# Patient Record
Sex: Male | Born: 1935
Health system: Southern US, Community
[De-identification: ages and names within clinical notes are randomized; demographics above are authoritative.]

## PROBLEM LIST (undated history)

## (undated) DIAGNOSIS — N329 Bladder disorder, unspecified: Secondary | ICD-10-CM

## (undated) DIAGNOSIS — E039 Hypothyroidism, unspecified: Secondary | ICD-10-CM

## (undated) DIAGNOSIS — Z973 Presence of spectacles and contact lenses: Secondary | ICD-10-CM

## (undated) DIAGNOSIS — C801 Malignant (primary) neoplasm, unspecified: Secondary | ICD-10-CM

## (undated) DIAGNOSIS — I10 Essential (primary) hypertension: Secondary | ICD-10-CM

## (undated) DIAGNOSIS — M1990 Unspecified osteoarthritis, unspecified site: Secondary | ICD-10-CM

## (undated) DIAGNOSIS — R351 Nocturia: Secondary | ICD-10-CM

## (undated) DIAGNOSIS — Z8551 Personal history of malignant neoplasm of bladder: Secondary | ICD-10-CM

## (undated) DIAGNOSIS — Z8679 Personal history of other diseases of the circulatory system: Secondary | ICD-10-CM

## (undated) DIAGNOSIS — J439 Emphysema, unspecified: Secondary | ICD-10-CM

## (undated) DIAGNOSIS — J45909 Unspecified asthma, uncomplicated: Secondary | ICD-10-CM

## (undated) DIAGNOSIS — I739 Peripheral vascular disease, unspecified: Secondary | ICD-10-CM

## (undated) DIAGNOSIS — E785 Hyperlipidemia, unspecified: Secondary | ICD-10-CM

## (undated) DIAGNOSIS — R3915 Urgency of urination: Secondary | ICD-10-CM

## (undated) DIAGNOSIS — Z8673 Personal history of transient ischemic attack (TIA), and cerebral infarction without residual deficits: Secondary | ICD-10-CM

## (undated) DIAGNOSIS — K5792 Diverticulitis of intestine, part unspecified, without perforation or abscess without bleeding: Secondary | ICD-10-CM

## (undated) DIAGNOSIS — R0602 Shortness of breath: Secondary | ICD-10-CM

## (undated) HISTORY — PX: CATARACT EXTRACTION W/ INTRAOCULAR LENS  IMPLANT, BILATERAL: SHX1307

## (undated) HISTORY — DX: Diverticulitis of intestine, part unspecified, without perforation or abscess without bleeding: K57.92

## (undated) HISTORY — PX: UMBILICAL HERNIA REPAIR: SHX196

## (undated) HISTORY — PX: INGUINAL HERNIA REPAIR: SUR1180

## (undated) HISTORY — PX: CARPAL TUNNEL RELEASE: SHX101

---

## 1988-08-18 HISTORY — PX: TRANSURETHRAL RESECTION OF BLADDER TUMOR: SHX2575

## 2002-08-18 HISTORY — PX: TOTAL KNEE ARTHROPLASTY: SHX125

## 2003-08-19 HISTORY — PX: CAROTID ENDARTERECTOMY: SUR193

## 2003-08-19 HISTORY — PX: OTHER SURGICAL HISTORY: SHX169

## 2004-01-26 ENCOUNTER — Other Ambulatory Visit: Payer: Self-pay

## 2004-08-27 ENCOUNTER — Ambulatory Visit: Payer: Self-pay | Admitting: Specialist

## 2004-08-29 ENCOUNTER — Ambulatory Visit: Payer: Self-pay | Admitting: Specialist

## 2005-03-09 ENCOUNTER — Emergency Department: Payer: Self-pay | Admitting: Emergency Medicine

## 2005-03-11 ENCOUNTER — Emergency Department: Payer: Self-pay | Admitting: Emergency Medicine

## 2005-07-07 ENCOUNTER — Ambulatory Visit: Payer: Self-pay | Admitting: Urology

## 2005-09-04 ENCOUNTER — Ambulatory Visit: Payer: Self-pay | Admitting: Internal Medicine

## 2005-09-22 ENCOUNTER — Inpatient Hospital Stay: Payer: Self-pay | Admitting: Internal Medicine

## 2005-10-06 ENCOUNTER — Emergency Department: Payer: Self-pay | Admitting: Emergency Medicine

## 2005-10-06 ENCOUNTER — Other Ambulatory Visit: Payer: Self-pay

## 2005-10-07 ENCOUNTER — Ambulatory Visit: Payer: Self-pay

## 2005-10-14 ENCOUNTER — Ambulatory Visit: Payer: Self-pay | Admitting: General Surgery

## 2006-08-18 HISTORY — PX: ORIF HIP FRACTURE: SHX2125

## 2006-09-15 ENCOUNTER — Ambulatory Visit: Payer: Self-pay | Admitting: General Surgery

## 2006-09-15 ENCOUNTER — Other Ambulatory Visit: Payer: Self-pay

## 2006-09-21 ENCOUNTER — Ambulatory Visit: Payer: Self-pay | Admitting: General Surgery

## 2007-03-25 ENCOUNTER — Other Ambulatory Visit: Payer: Self-pay

## 2007-03-25 ENCOUNTER — Inpatient Hospital Stay: Payer: Self-pay | Admitting: Specialist

## 2007-04-20 ENCOUNTER — Ambulatory Visit: Payer: Self-pay | Admitting: Internal Medicine

## 2007-11-03 ENCOUNTER — Ambulatory Visit: Payer: Self-pay | Admitting: Internal Medicine

## 2007-11-22 ENCOUNTER — Ambulatory Visit: Payer: Self-pay | Admitting: Unknown Physician Specialty

## 2007-11-22 ENCOUNTER — Other Ambulatory Visit: Payer: Self-pay

## 2010-06-28 ENCOUNTER — Observation Stay: Payer: Self-pay

## 2010-07-19 ENCOUNTER — Ambulatory Visit: Payer: Self-pay | Admitting: Internal Medicine

## 2011-04-21 ENCOUNTER — Inpatient Hospital Stay: Payer: Self-pay | Admitting: Surgery

## 2013-12-16 ENCOUNTER — Other Ambulatory Visit: Payer: Self-pay | Admitting: Urology

## 2013-12-20 ENCOUNTER — Encounter (HOSPITAL_BASED_OUTPATIENT_CLINIC_OR_DEPARTMENT_OTHER): Payer: Self-pay | Admitting: *Deleted

## 2013-12-21 ENCOUNTER — Encounter (HOSPITAL_BASED_OUTPATIENT_CLINIC_OR_DEPARTMENT_OTHER): Payer: Self-pay | Admitting: *Deleted

## 2013-12-21 NOTE — Progress Notes (Addendum)
SPOKE W/ WIFE.  NPO AFTER MN. ARRIVE AT 1030.  NEEDS ISTAT AND EKG. WILL TAKE AM MEDS W/ SIPS OF WATER AM DOS.  PT POOR HISTORIAN.

## 2013-12-26 ENCOUNTER — Ambulatory Visit (HOSPITAL_BASED_OUTPATIENT_CLINIC_OR_DEPARTMENT_OTHER): Payer: Medicare PPO | Admitting: Anesthesiology

## 2013-12-26 ENCOUNTER — Ambulatory Visit (HOSPITAL_BASED_OUTPATIENT_CLINIC_OR_DEPARTMENT_OTHER)
Admission: RE | Admit: 2013-12-26 | Discharge: 2013-12-26 | Disposition: A | Discharge status: Home / Self Care | Payer: Medicare PPO | Source: Ambulatory Visit | Attending: Urology | Admitting: Urology

## 2013-12-26 ENCOUNTER — Encounter (HOSPITAL_BASED_OUTPATIENT_CLINIC_OR_DEPARTMENT_OTHER): Payer: Medicare PPO | Admitting: Anesthesiology

## 2013-12-26 ENCOUNTER — Encounter (HOSPITAL_BASED_OUTPATIENT_CLINIC_OR_DEPARTMENT_OTHER)
Admission: RE | Disposition: A | Discharge status: Home / Self Care | Payer: Self-pay | Source: Ambulatory Visit | Attending: Urology

## 2013-12-26 ENCOUNTER — Encounter (HOSPITAL_BASED_OUTPATIENT_CLINIC_OR_DEPARTMENT_OTHER): Payer: Self-pay | Admitting: Anesthesiology

## 2013-12-26 DIAGNOSIS — Z7982 Long term (current) use of aspirin: Secondary | ICD-10-CM | POA: Insufficient documentation

## 2013-12-26 DIAGNOSIS — N3289 Other specified disorders of bladder: Secondary | ICD-10-CM | POA: Insufficient documentation

## 2013-12-26 DIAGNOSIS — J4489 Other specified chronic obstructive pulmonary disease: Secondary | ICD-10-CM | POA: Insufficient documentation

## 2013-12-26 DIAGNOSIS — I10 Essential (primary) hypertension: Secondary | ICD-10-CM | POA: Insufficient documentation

## 2013-12-26 DIAGNOSIS — E7800 Pure hypercholesterolemia, unspecified: Secondary | ICD-10-CM | POA: Insufficient documentation

## 2013-12-26 DIAGNOSIS — Z8551 Personal history of malignant neoplasm of bladder: Secondary | ICD-10-CM | POA: Insufficient documentation

## 2013-12-26 DIAGNOSIS — Z87891 Personal history of nicotine dependence: Secondary | ICD-10-CM | POA: Insufficient documentation

## 2013-12-26 DIAGNOSIS — E039 Hypothyroidism, unspecified: Secondary | ICD-10-CM | POA: Insufficient documentation

## 2013-12-26 DIAGNOSIS — M129 Arthropathy, unspecified: Secondary | ICD-10-CM | POA: Insufficient documentation

## 2013-12-26 DIAGNOSIS — Z79899 Other long term (current) drug therapy: Secondary | ICD-10-CM | POA: Insufficient documentation

## 2013-12-26 DIAGNOSIS — Z880 Allergy status to penicillin: Secondary | ICD-10-CM | POA: Insufficient documentation

## 2013-12-26 DIAGNOSIS — K5730 Diverticulosis of large intestine without perforation or abscess without bleeding: Secondary | ICD-10-CM | POA: Insufficient documentation

## 2013-12-26 DIAGNOSIS — N329 Bladder disorder, unspecified: Secondary | ICD-10-CM

## 2013-12-26 DIAGNOSIS — J449 Chronic obstructive pulmonary disease, unspecified: Secondary | ICD-10-CM | POA: Insufficient documentation

## 2013-12-26 DIAGNOSIS — N3090 Cystitis, unspecified without hematuria: Secondary | ICD-10-CM | POA: Insufficient documentation

## 2013-12-26 HISTORY — DX: Peripheral vascular disease, unspecified: I73.9

## 2013-12-26 HISTORY — DX: Personal history of malignant neoplasm of bladder: Z85.51

## 2013-12-26 HISTORY — PX: CYSTOSCOPY WITH BIOPSY: SHX5122

## 2013-12-26 HISTORY — DX: Nocturia: R35.1

## 2013-12-26 HISTORY — DX: Emphysema, unspecified: J43.9

## 2013-12-26 HISTORY — DX: Presence of spectacles and contact lenses: Z97.3

## 2013-12-26 HISTORY — DX: Essential (primary) hypertension: I10

## 2013-12-26 HISTORY — DX: Hypothyroidism, unspecified: E03.9

## 2013-12-26 HISTORY — DX: Personal history of transient ischemic attack (TIA), and cerebral infarction without residual deficits: Z86.73

## 2013-12-26 HISTORY — DX: Bladder disorder, unspecified: N32.9

## 2013-12-26 HISTORY — DX: Unspecified osteoarthritis, unspecified site: M19.90

## 2013-12-26 HISTORY — DX: Shortness of breath: R06.02

## 2013-12-26 HISTORY — PX: CYSTOSCOPY W/ RETROGRADES: SHX1426

## 2013-12-26 HISTORY — DX: Personal history of other diseases of the circulatory system: Z86.79

## 2013-12-26 HISTORY — DX: Urgency of urination: R39.15

## 2013-12-26 HISTORY — DX: Unspecified asthma, uncomplicated: J45.909

## 2013-12-26 HISTORY — DX: Hyperlipidemia, unspecified: E78.5

## 2013-12-26 LAB — POCT I-STAT 4, (NA,K, GLUC, HGB,HCT)
Glucose, Bld: 130 mg/dL — ABNORMAL HIGH (ref 70–99)
HCT: 47 % (ref 39.0–52.0)
Hemoglobin: 16 g/dL (ref 13.0–17.0)
Potassium: 4.1 mEq/L (ref 3.7–5.3)
Sodium: 141 mEq/L (ref 137–147)

## 2013-12-26 SURGERY — CYSTOSCOPY, WITH BIOPSY
Anesthesia: General | Site: Ureter

## 2013-12-26 MED ORDER — TAMSULOSIN HCL 0.4 MG PO CAPS
ORAL_CAPSULE | ORAL | Status: AC
  Filled 2013-12-26: qty 1

## 2013-12-26 MED ORDER — LACTATED RINGERS IV SOLN
INTRAVENOUS | Status: DC
  Filled 2013-12-26: qty 1000

## 2013-12-26 MED ORDER — PHENAZOPYRIDINE HCL 200 MG PO TABS
200.0000 mg | ORAL_TABLET | Freq: Once | ORAL | Status: AC
  Administered 2013-12-26: 200 mg via ORAL
  Filled 2013-12-26: qty 1

## 2013-12-26 MED ORDER — FENTANYL CITRATE 0.05 MG/ML IJ SOLN
INTRAMUSCULAR | Status: DC | PRN
  Administered 2013-12-26 (×2): 12.5 ug via INTRAVENOUS
  Administered 2013-12-26: 6.25 ug via INTRAVENOUS
  Administered 2013-12-26: 12.5 ug via INTRAVENOUS
  Administered 2013-12-26: 6.25 ug via INTRAVENOUS
  Administered 2013-12-26 (×4): 12.5 ug via INTRAVENOUS

## 2013-12-26 MED ORDER — IOHEXOL 350 MG/ML SOLN
INTRAVENOUS | Status: DC | PRN
  Administered 2013-12-26: 10 mL

## 2013-12-26 MED ORDER — PROPOFOL 10 MG/ML IV BOLUS
INTRAVENOUS | Status: DC | PRN
  Administered 2013-12-26: 140 mg via INTRAVENOUS

## 2013-12-26 MED ORDER — LACTATED RINGERS IV SOLN
INTRAVENOUS | Status: DC | PRN
  Administered 2013-12-26 (×2): via INTRAVENOUS

## 2013-12-26 MED ORDER — ACETAMINOPHEN 10 MG/ML IV SOLN
INTRAVENOUS | Status: DC | PRN
  Administered 2013-12-26: 1000 mg via INTRAVENOUS

## 2013-12-26 MED ORDER — CIPROFLOXACIN IN D5W 200 MG/100ML IV SOLN
200.0000 mg | INTRAVENOUS | Status: AC
  Administered 2013-12-26: 200 mg via INTRAVENOUS
  Filled 2013-12-26: qty 100

## 2013-12-26 MED ORDER — FENTANYL CITRATE 0.05 MG/ML IJ SOLN
25.0000 ug | INTRAMUSCULAR | Status: DC | PRN
  Filled 2013-12-26: qty 1

## 2013-12-26 MED ORDER — KETOROLAC TROMETHAMINE 30 MG/ML IJ SOLN
INTRAMUSCULAR | Status: DC | PRN
  Administered 2013-12-26: 15 mg via INTRAVENOUS

## 2013-12-26 MED ORDER — ONDANSETRON HCL 4 MG/2ML IJ SOLN
INTRAMUSCULAR | Status: DC | PRN
  Administered 2013-12-26: 4 mg via INTRAVENOUS

## 2013-12-26 MED ORDER — LACTATED RINGERS IV SOLN
INTRAVENOUS | Status: DC
  Administered 2013-12-26: 11:00:00 via INTRAVENOUS
  Filled 2013-12-26: qty 1000

## 2013-12-26 MED ORDER — HYDROCODONE-ACETAMINOPHEN 7.5-325 MG PO TABS: 1.0000 | ORAL_TABLET | ORAL | Status: DC | PRN

## 2013-12-26 MED ORDER — PHENAZOPYRIDINE HCL 200 MG PO TABS: 200.0000 mg | ORAL_TABLET | Freq: Three times a day (TID) | ORAL | Status: DC | PRN

## 2013-12-26 MED ORDER — FENTANYL CITRATE 0.05 MG/ML IJ SOLN
INTRAMUSCULAR | Status: AC
  Filled 2013-12-26: qty 4

## 2013-12-26 MED ORDER — PHENAZOPYRIDINE HCL 100 MG PO TABS
ORAL_TABLET | ORAL | Status: AC
  Filled 2013-12-26: qty 2

## 2013-12-26 MED ORDER — TAMSULOSIN HCL 0.4 MG PO CAPS
0.4000 mg | ORAL_CAPSULE | Freq: Once | ORAL | Status: AC
  Administered 2013-12-26: 0.4 mg via ORAL
  Filled 2013-12-26: qty 1

## 2013-12-26 MED ORDER — SODIUM CHLORIDE 0.9 % IR SOLN
Status: DC | PRN
  Administered 2013-12-26: 6000 mL

## 2013-12-26 MED ORDER — LIDOCAINE HCL (CARDIAC) 20 MG/ML IV SOLN
INTRAVENOUS | Status: DC | PRN
Start: 2013-12-26 — End: 2013-12-26
  Administered 2013-12-26: 40 mg via INTRAVENOUS

## 2013-12-26 SURGICAL SUPPLY — 33 items
ADAPTER CATH URET PLST 4-6FR (CATHETERS) IMPLANT
BAG DRAIN URO-CYSTO SKYTR STRL (DRAIN) ×3 IMPLANT
BASKET LASER NITINOL 1.9FR (BASKET) IMPLANT
BASKET STNLS GEMINI 4WIRE 3FR (BASKET) IMPLANT
BASKET ZERO TIP NITINOL 2.4FR (BASKET) IMPLANT
CANISTER SUCT LVC 12 LTR MEDI- (MISCELLANEOUS) ×3 IMPLANT
CATH INTERMIT  6FR 70CM (CATHETERS) ×3 IMPLANT
CATH URET 5FR 28IN CONE TIP (BALLOONS)
CATH URET 5FR 70CM CONE TIP (BALLOONS) IMPLANT
CLOTH BEACON ORANGE TIMEOUT ST (SAFETY) ×3 IMPLANT
DRAPE CAMERA CLOSED 9X96 (DRAPES) ×3 IMPLANT
ELECT REM PT RETURN 9FT ADLT (ELECTROSURGICAL) ×6
ELECTRODE REM PT RTRN 9FT ADLT (ELECTROSURGICAL) ×4 IMPLANT
GLOVE BIO SURGEON STRL SZ8 (GLOVE) ×3 IMPLANT
GLOVE SURG SS PI 7.5 STRL IVOR (GLOVE) ×6 IMPLANT
GOWN PREVENTION PLUS LG XLONG (DISPOSABLE) IMPLANT
GOWN STRL REIN XL XLG (GOWN DISPOSABLE) IMPLANT
GOWN STRL REUS W/TWL XL LVL3 (GOWN DISPOSABLE) ×6 IMPLANT
GUIDEWIRE 0.038 PTFE COATED (WIRE) IMPLANT
GUIDEWIRE ANG ZIPWIRE 038X150 (WIRE) IMPLANT
GUIDEWIRE STR DUAL SENSOR (WIRE) ×3 IMPLANT
IV NS IRRIG 3000ML ARTHROMATIC (IV SOLUTION) ×6 IMPLANT
KIT BALLIN UROMAX 15FX10 (LABEL) IMPLANT
KIT BALLN UROMAX 15FX4 (MISCELLANEOUS) IMPLANT
KIT BALLN UROMAX 26 75X4 (MISCELLANEOUS)
NEEDLE HYPO 22GX1.5 SAFETY (NEEDLE) IMPLANT
NS IRRIG 500ML POUR BTL (IV SOLUTION) IMPLANT
PACK CYSTOSCOPY (CUSTOM PROCEDURE TRAY) ×3 IMPLANT
SET HIGH PRES BAL DIL (LABEL)
SHEATH URET ACCESS 12FR/35CM (UROLOGICAL SUPPLIES) IMPLANT
SHEATH URET ACCESS 12FR/55CM (UROLOGICAL SUPPLIES) IMPLANT
SYRINGE IRR TOOMEY STRL 70CC (SYRINGE) IMPLANT
WATER STERILE IRR 3000ML UROMA (IV SOLUTION) ×3 IMPLANT

## 2013-12-26 NOTE — Discharge Instructions (Addendum)
Post Bladder Surgery Instructions ° ° °General instructions: °   ° Your recent bladder surgery requires very little post hospital care but some definite precautions. ° °Despite the fact that no skin incisions were used, the area around the bladder incisions are raw and covered with scabs to promote healing and prevent bleeding. Certain precautions are needed to insure that the scabs are not disturbed over the next 2-4 weeks while the healing proceeds. ° °Because the raw surface inside your bladder and the irritating effects of urine you may expect frequency of urination and/or urgency (a stronger desire to urinate) and perhaps even getting up at night more often. This will usually resolve or improve slowly over the healing period. You may see some blood in your urine over the first 6 weeks. Do not be alarmed, even if the urine was clear for a while. Get off your feet and drink lots of fluids until clearing occurs. If you start to pass clots or don't improve call us. ° °Catheter: (If you are discharged with a catheter.) ° °1. Keep your catheter secured to your leg at all times with tape or the supplied strap. °2. You may experience leakage of urine around your catheter- as long as the  °catheter continues to drain, this is normal.  If your catheter stops draining  °go to the ER. °3. You may also have blood in your urine, even after it has been clear for  °several days; you may even pass some small blood clots or other material.  This  °is normal as well.  If this happens, sit down and drink plenty of water to help  °make urine to flush out your bladder.  If the blood in your urine becomes worse  °after doing this, contact our office or return to the ER. °4. You may use the leg bag (small bag) during the day, but use the large bag at  °night. ° °Diet: ° °You may return to your normal diet immediately. Because of the raw surface of your bladder, alcohol, spicy foods, foods high in acid and drinks with caffeine may  cause irritation or frequency and should be used in moderation. To keep your urine flowing freely and avoid constipation, drink plenty of fluids during the day (8-10 glasses). Tip: Avoid cranberry juice because it is very acidic. ° °Activity: ° °Your physical activity doesn't need to be restricted. However, if you are very active, you may see some blood in the urine. We suggest that you reduce your activity under the circumstances until the bleeding has stopped. ° °Bowels: ° °It is important to keep your bowels regular during the postoperative period. Straining with bowel movements can cause bleeding. A bowel movement every other day is reasonable. Use a mild laxative if needed, such as milk of magnesia 2-3 tablespoons, or 2 Dulcolax tablets. Call if you continue to have problems. If you had been taking narcotics for pain, before, during or after your surgery, you may be constipated. Take a laxative if necessary. ° ° ° °Medication: ° °You should resume your pre-surgery medications unless told not to. In addition you may be given an antibiotic to prevent or treat infection. Antibiotics are not always necessary. All medication should be taken as prescribed until the bottles are finished unless you are having an unusual reaction to one of the drugs. ° ° °Post Anesthesia Home Care Instructions ° °Activity: °Get plenty of rest for the remainder of the day. A responsible adult should stay with you for   24 hours following the procedure.  °For the next 24 hours, DO NOT: °-Drive a car °-Operate machinery °-Drink alcoholic beverages °-Take any medication unless instructed by your physician °-Make any legal decisions or sign important papers. ° °Meals: °Start with liquid foods such as gelatin or soup. Progress to regular foods as tolerated. Avoid greasy, spicy, heavy foods. If nausea and/or vomiting occur, drink only clear liquids until the nausea and/or vomiting subsides. Call your physician if vomiting continues. ° °Special  Instructions/Symptoms: °Your throat may feel dry or sore from the anesthesia or the breathing tube placed in your throat during surgery. If this causes discomfort, gargle with warm salt water. The discomfort should disappear within 24 hours. ° ° °

## 2013-12-26 NOTE — H&P (Signed)
David Morgan is an 80 year old male with a history of bladder cancer.   History of Present Illness         LUTS: He reported that he's had progressive worsening of his urinary symptoms that consist of nocturia x6, frequency, urgency and urge incontinence. He says he voids with a pretty good stream but has to map out that rooms when he goes to town. His symptoms have been controlled with Toviaz 4 mg and tamsulosin.    He has a history of what sounds like bladder cancer. About 20 years ago it sounds like he underwent an open surgery??? for that and then followed up appropriately for a number of years until he was told he was "cancer free".     Interval history: He reports that he is having fairly significant nocturia but is wearing compression stockings and has difficulty with lower extremity edema. He denies any hematuria.  IPSS 12/07/12 - 13/mostly satisfied.   Past Medical History Problems  1. History of Arthritis (V13.4) 2. History of Asthma (493.90) 3. History of diverticulitis of colon (V12.79) 4. History of hypercholesterolemia (V12.29) 5. History of hypertension (V12.59) 6. History of hypothyroidism (V12.29) 7. Personal history of bladder cancer (V10.51) 8. History of Stroke syndrome (434.91)  Surgical History Problems  1. History of Bladder Surgery 2. History of Hip Surgery 3. History of Knee Replacement 4. History of Vesicoureteral Reimplantation  Current Meds 1. Aspirin 325 MG Oral Tablet;  Therapy: (Recorded:06Nov2012) to Recorded 2. Atorvastatin Calcium 40 MG Oral Tablet;  Therapy: (Recorded:06Nov2012) to Recorded 3. Carvedilol 3.125 MG Oral Tablet;  Therapy: (Recorded:06Nov2012) to Recorded 4. Hydrocodone-Acetaminophen 5-500 MG Oral Tablet;  Therapy: [id number] to Recorded 5. Levothyroxine Sodium 137 MCG Oral Tablet;  Therapy: [phone] to Recorded 6. Losartan Potassium 100 MG Oral Tablet;  Therapy: (Recorded:06Nov2012) to Recorded 7. Meloxicam 15 MG Oral  Tablet;  Therapy: (Recorded:06Nov2012) to Recorded 8. Multi Vitamin/Minerals TABS;  Therapy: (Recorded:06Nov2012) to Recorded 9. Tamsulosin HCl - 0.4 MG Oral Capsule; TAKE 1 CAPSULE As Directed  Requested for:  22Oct2013; Last MG:[id number] Ordered 10. Toviaz 4 MG Oral Tablet Extended Release 24 Hour; Take 1 tablet daily;   Therapy: 23Apr2013 to (Evaluate:08Jan2016)  Requested for: [id number]; Last   Rx:13Jan2015 Ordered  Allergies Medication  1. Augmentin TABS Non-Medication  2. Adhesive Tape  Family History Problems  1. Family history of Alzheimer's Disease : Mother 2. Family history of Death In The Family Father 3. Family history of Death In The Family Mother 4. Family history of Family Health Status Children ___ Living Sons   2  Social History Problems  1. Alcohol Use   less than 1 per month 2. Denied: History of Caffeine Use 3. Former smoker [place])   smoked 1 ppd for 30 years; quit 20 years ago 25. Marital History - Currently Married 5. Retired From Work   Vitals Vital Signs  Height: 5 ft 10 in Weight: 230 lb  BMI Calculated: 33 BSA Calculated: 2.22 Blood Pressure: 211 / 88 Temperature: 98.2 F Heart Rate: 84  Review of Systems Genitourinary, constitutional, skin, eye, otolaryngeal, hematologic/lymphatic, cardiovascular, pulmonary, endocrine, musculoskeletal, gastrointestinal, neurological and psychiatric system(s) were reviewed and pertinent findings if present are noted.  Genitourinary: urinary frequency, feelings of urinary urgency, dysuria, nocturia, incontinence, difficulty starting the urinary stream, weak urinary stream and erectile dysfunction, but no hematuria. Gastrointestinal: nausea, abdominal pain and heartburn.  Cardiovascular: leg swelling.  Respiratory: shortness of breath and cough.  Musculoskeletal: joint pain.   Physical Exam Constitutional: Well nourished and  well developed . No acute distress.  ENT:. The ears and nose are normal in  appearance.  Neck: The appearance of the neck is normal and no neck mass is present.  Pulmonary: No respiratory distress and normal respiratory rhythm and effort.  Cardiovascular: Heart rate and rhythm are normal . No peripheral edema.  Abdomen: The abdomen is obese. The abdomen is soft and nontender. No masses are palpated. No CVA tenderness. No hernias are palpable. No hepatosplenomegaly noted.  Rectal: Rectal exam demonstrates normal sphincter tone, no tenderness and no masses. The prostate has no nodularity and is not tender. The left seminal vesicle is nonpalpable. The right seminal vesicle is nonpalpable. The perineum is normal on inspection.  Genitourinary: Examination of the penis demonstrates no discharge, no masses, no lesions and a normal meatus. The scrotum is without lesions. The right epididymis is palpably normal and non-tender. The left epididymis is palpably normal and non-tender. The right testis is non-tender and without masses. The left testis is non-tender and without masses.  Lymphatics: The femoral and inguinal nodes are not enlarged or tender.  Skin: Normal skin turgor, no visible rash and no visible skin lesions.  Neuro/Psych:. Mood and affect are appropriate.   Procedure Cystoscopy was performed on 12/14/13 with the following findings:  Indication: History of Urothelial Carcinoma.  Informed Consent: Risks, benefits, and potential adverse events were discussed and informed consent was obtained from the patient.  Prep: The patient was prepped with betadine.  Anesthesia:. Local anesthesia was administered intraurethrally with 2% lidocaine jelly.  Procedure Note:  Urethral meatus:. No abnormalities.  Anterior urethra: No abnormalities.  Prostatic urethra: No abnormalities . The lateral prostatic lobes were enlarged.  Bladder: Visulization was clear. The ureteral orifices were in the normal anatomic position bilaterally and had clear efflux of urine. Examination of the  bladder demonstrated trabeculation erythematous mucosa. The patient tolerated the procedure well.  Complications: None.    Assessment I found several areas on the posterior wall of the bladder that were concerning for possible CIS. There was no evidence of papillary tumors.  I cultured his urine and it was found to be negative.  A urine cytology done on 12/16/13 revealed atypical urothelial cells.  I therefore have recommended proceeding with cystoscopy and bladder biopsy of the abnormal-appearing areas in the bladder under anesthesia as an outpatient.   Plan  He is scheduled for cystoscopy and bladder biopsy under anesthesia.

## 2013-12-26 NOTE — Op Note (Signed)
PATIENT:  David Morgan  PRE-OPERATIVE DIAGNOSIS: 1. Bladder lesions  2. History of transitional cell carcinoma of the bladder. 3. Atypia on cytology  POST-OPERATIVE DIAGNOSIS: Same  PROCEDURE: 1. Cystoscopy with bilateral retrograde pyelograms including interpretation. 2. Bladder biopsy  SURGEON:  Claybon Jabs  INDICATION: CHENG DEC is a 80 year old male who underwent previous open surgery for bladder cancer approximately 20 years ago. Based on his history and my cystoscopic findings recently it appears he has undergone a right ureteral reimplantation. At the time of surveillance cystoscopy in my office I found reddened areas on the posterior wall of the bladder. A urine culture was found to be negative and cytology revealed atypia. He is brought to the operating room today for bladder biopsy and retrograde pyelograms.  ANESTHESIA:  General  EBL:  Minimal  DRAINS: None  LOCAL MEDICATIONS USED:  None  SPECIMEN:  Cold cup biopsies of the bladder lesions.  Description of procedure: After informed consent the patient was taken to the operating room and placed on the table in a supine position. General anesthesia was then administered. Once fully anesthetized the patient was moved to the dorsal lithotomy position and the genitalia were sterilely prepped and draped in standard fashion. An official timeout was then performed.  The 84 French rigid cystoscope with 12 lens was then passed under direct vision down the urethra which was noted be entirely normal. The prostatic urethra revealed some elongation and bilobar hypertrophy but no lesions were noted within the prostatic urethra. The bladder was then entered and inspected with both the 12 and 70 lenses. I noted no papillary lesions. The left ureteral orifice appeared to be of normal configuration and position the right ureteral orifice appeared to be somewhat lateral and superior to its expected anatomic location. I  identified 3 areas of redness in the bladder one on the posterior floor in the midline, one on the right upper wall and one on the left upper wall.  I inserted the cold cup biopsy forceps and obtained a single cold cup biopsy from each of the reddened areas. I then inserted the Bugbee electrode and fulgurated the biopsy sites which controlled all bleeding. I then proceeded with bilateral retrograde pyelograms.  A 6 French open-ended ureteral catheter was then passed through the cystoscope and into the left ureteral orifice. I then injected full-strength Omnipaque contrast through the open ended catheter and up the left ureter under direct fluoroscopic control and noted the ureter in its entire length was noted to be completely normal in appearance with no filling defects or other abnormalities. The intrarenal collecting system was also noted be entirely normal. I then inserted the open-ended catheter and the right ureteral orifice. I injected contrast again under direct fluoroscopy and noted that the orifice appeared to be located superior to the left ureteral orifice however contrast passed up the ureter unimpeded without evidence of mass effect, filling defects or hydronephrosis. The intrarenal collecting system was also noted be entirely normal.  I then reinspected the biopsy sites and noted no bleeding. I therefore drained the bladder and remove the cystoscope and the patient was awakened and taken to the recovery room in stable and satisfactory condition. He tolerated procedure well no intraoperative complications.  PLAN OF CARE: Discharge to home after PACU  PATIENT DISPOSITION:  PACU - hemodynamically stable.

## 2013-12-26 NOTE — Anesthesia Preprocedure Evaluation (Addendum)
Anesthesia Evaluation  Patient identified by MRN, date of birth, ID band Patient awake    Reviewed: Allergy & Precautions, H&P , NPO status , Patient's Chart, lab work & pertinent test results, reviewed documented beta blocker date and time   Airway Mallampati: II TM Distance: >3 FB Neck ROM: full    Dental no notable dental hx. (+) Teeth Intact, Dental Advisory Given   Pulmonary neg pulmonary ROS, shortness of breath and with exertion, asthma , COPDformer smoker,  breath sounds clear to auscultation  Pulmonary exam normal       Cardiovascular Exercise Tolerance: Good hypertension, Pt. on medications and Pt. on home beta blockers + Peripheral Vascular Disease negative cardio ROS  Rhythm:regular Rate:Normal  claudication   Neuro/Psych CEA 2005 TIAnegative neurological ROS  negative psych ROS   GI/Hepatic negative GI ROS, Neg liver ROS,   Endo/Other  negative endocrine ROSHypothyroidism   Renal/GU negative Renal ROS  negative genitourinary   Musculoskeletal   Abdominal   Peds  Hematology negative hematology ROS (+)   Anesthesia Other Findings   Reproductive/Obstetrics negative OB ROS                          Anesthesia Physical Anesthesia Plan  ASA: III  Anesthesia Plan: General   Post-op Pain Management:    Induction: Intravenous  Airway Management Planned: LMA  Additional Equipment:   Intra-op Plan:   Post-operative Plan:   Informed Consent: I have reviewed the patients History and Physical, chart, labs and discussed the procedure including the risks, benefits and alternatives for the proposed anesthesia with the patient or authorized representative who has indicated his/her understanding and acceptance.   Dental Advisory Given  Plan Discussed with: CRNA and Surgeon  Anesthesia Plan Comments:         Anesthesia Quick Evaluation

## 2013-12-26 NOTE — Anesthesia Postprocedure Evaluation (Signed)
Anesthesia Post Note  Patient: David Morgan  Procedure(s) Performed: Procedure(s) (LRB): CYSTOSCOPY WITH BLADDER BIOPSY (N/A) BILATERAL RETROGRADE PYELOGRAM (Bilateral)  Anesthesia type: General  Patient location: PACU  Post pain: Pain level controlled  Post assessment: Post-op Vital signs reviewed  Last Vitals: BP 171/74  Pulse 68  Temp(Src) 36.1 C (Oral)  Resp 18  Ht 5\' 10"  (1.778 m)  Wt 228 lb (103.42 kg)  BMI 32.71 kg/m2  SpO2 97%  Post vital signs: Reviewed  Level of consciousness: sedated  Complications: No apparent anesthesia complications

## 2013-12-26 NOTE — Anesthesia Procedure Notes (Signed)
Procedure Name: LMA Insertion Date/Time: 12/26/2013 12:04 PM Performed by: Justice Rocher Pre-anesthesia Checklist: Patient identified, Emergency Drugs available, Suction available and Patient being monitored Patient Re-evaluated:Patient Re-evaluated prior to inductionOxygen Delivery Method: Circle System Utilized Preoxygenation: Pre-oxygenation with 100% oxygen Intubation Type: IV induction Ventilation: Mask ventilation without difficulty LMA: LMA inserted LMA Size: 5.0 Number of attempts: 1 Airway Equipment and Method: bite block Placement Confirmation: positive ETCO2 Tube secured with: Tape Dental Injury: Teeth and Oropharynx as per pre-operative assessment

## 2013-12-26 NOTE — Transfer of Care (Signed)
Immediate Anesthesia Transfer of Care Note  Patient: David Morgan  Procedure(s) Performed: Procedure(s) (LRB): CYSTOSCOPY WITH BLADDER BIOPSY (N/A) BILATERAL RETROGRADE PYELOGRAM (Bilateral)  Patient Location: PACU  Anesthesia Type: General  Level of Consciousness: awake, sedated, patient cooperative and responds to stimulation  Airway & Oxygen Therapy: Patient Spontanous Breathing and Patient connected to face mask oxygen  Post-op Assessment: Report given to PACU RN, Post -op Vital signs reviewed and stable and Patient moving all extremities  Post vital signs: Reviewed and stable  Complications: No apparent anesthesia complications

## 2013-12-27 ENCOUNTER — Encounter (HOSPITAL_BASED_OUTPATIENT_CLINIC_OR_DEPARTMENT_OTHER): Payer: Self-pay | Admitting: Urology

## 2014-08-02 DIAGNOSIS — N400 Enlarged prostate without lower urinary tract symptoms: Secondary | ICD-10-CM | POA: Insufficient documentation

## 2015-06-22 ENCOUNTER — Other Ambulatory Visit: Payer: Self-pay | Admitting: Internal Medicine

## 2015-06-22 DIAGNOSIS — R1032 Left lower quadrant pain: Secondary | ICD-10-CM

## 2015-06-28 ENCOUNTER — Encounter
Admission: EM | Disposition: A | Discharge status: Home Health | Payer: Self-pay | Source: Home / Self Care | Attending: Surgery

## 2015-06-28 ENCOUNTER — Inpatient Hospital Stay
Admission: EM | Admit: 2015-06-28 | Discharge: 2015-07-03 | DRG: 357 | Disposition: A | Discharge status: Home Health | Payer: Commercial Managed Care - HMO | Attending: Surgery | Admitting: Surgery

## 2015-06-28 ENCOUNTER — Ambulatory Visit
Admission: RE | Admit: 2015-06-28 | Discharge: 2015-06-28 | Disposition: A | Discharge status: Home / Self Care | Payer: Commercial Managed Care - HMO | Source: Ambulatory Visit | Attending: Internal Medicine | Admitting: Internal Medicine

## 2015-06-28 ENCOUNTER — Inpatient Hospital Stay: Payer: Commercial Managed Care - HMO | Admitting: Anesthesiology

## 2015-06-28 DIAGNOSIS — Z9842 Cataract extraction status, left eye: Secondary | ICD-10-CM

## 2015-06-28 DIAGNOSIS — Z8673 Personal history of transient ischemic attack (TIA), and cerebral infarction without residual deficits: Secondary | ICD-10-CM

## 2015-06-28 DIAGNOSIS — Z961 Presence of intraocular lens: Secondary | ICD-10-CM | POA: Diagnosis present

## 2015-06-28 DIAGNOSIS — K5730 Diverticulosis of large intestine without perforation or abscess without bleeding: Secondary | ICD-10-CM

## 2015-06-28 DIAGNOSIS — Z8551 Personal history of malignant neoplasm of bladder: Secondary | ICD-10-CM

## 2015-06-28 DIAGNOSIS — I10 Essential (primary) hypertension: Secondary | ICD-10-CM | POA: Diagnosis present

## 2015-06-28 DIAGNOSIS — Z87891 Personal history of nicotine dependence: Secondary | ICD-10-CM

## 2015-06-28 DIAGNOSIS — Z96651 Presence of right artificial knee joint: Secondary | ICD-10-CM | POA: Diagnosis present

## 2015-06-28 DIAGNOSIS — K5720 Diverticulitis of large intestine with perforation and abscess without bleeding: Principal | ICD-10-CM | POA: Diagnosis present

## 2015-06-28 DIAGNOSIS — I739 Peripheral vascular disease, unspecified: Secondary | ICD-10-CM | POA: Diagnosis present

## 2015-06-28 DIAGNOSIS — K429 Umbilical hernia without obstruction or gangrene: Secondary | ICD-10-CM | POA: Diagnosis present

## 2015-06-28 DIAGNOSIS — K651 Peritoneal abscess: Secondary | ICD-10-CM

## 2015-06-28 DIAGNOSIS — J45909 Unspecified asthma, uncomplicated: Secondary | ICD-10-CM | POA: Diagnosis present

## 2015-06-28 DIAGNOSIS — M1990 Unspecified osteoarthritis, unspecified site: Secondary | ICD-10-CM | POA: Diagnosis present

## 2015-06-28 DIAGNOSIS — Z9841 Cataract extraction status, right eye: Secondary | ICD-10-CM

## 2015-06-28 DIAGNOSIS — J449 Chronic obstructive pulmonary disease, unspecified: Secondary | ICD-10-CM | POA: Diagnosis present

## 2015-06-28 DIAGNOSIS — E785 Hyperlipidemia, unspecified: Secondary | ICD-10-CM | POA: Diagnosis present

## 2015-06-28 DIAGNOSIS — K660 Peritoneal adhesions (postprocedural) (postinfection): Secondary | ICD-10-CM | POA: Diagnosis present

## 2015-06-28 DIAGNOSIS — K631 Perforation of intestine (nontraumatic): Secondary | ICD-10-CM

## 2015-06-28 DIAGNOSIS — R1032 Left lower quadrant pain: Secondary | ICD-10-CM

## 2015-06-28 DIAGNOSIS — E039 Hypothyroidism, unspecified: Secondary | ICD-10-CM | POA: Diagnosis present

## 2015-06-28 DIAGNOSIS — K5792 Diverticulitis of intestine, part unspecified, without perforation or abscess without bleeding: Secondary | ICD-10-CM | POA: Diagnosis present

## 2015-06-28 HISTORY — PX: LAPAROSCOPIC LYSIS OF ADHESIONS: SHX5905

## 2015-06-28 HISTORY — PX: LAPAROSCOPY: SHX197

## 2015-06-28 HISTORY — DX: Malignant (primary) neoplasm, unspecified: C80.1

## 2015-06-28 LAB — COMPREHENSIVE METABOLIC PANEL
ALBUMIN: 3.3 g/dL — AB (ref 3.5–5.0)
ALT: 49 U/L (ref 17–63)
AST: 28 U/L (ref 15–41)
Alkaline Phosphatase: 77 U/L (ref 38–126)
Anion gap: 5 (ref 5–15)
BUN: 27 mg/dL — AB (ref 6–20)
CHLORIDE: 102 mmol/L (ref 101–111)
CO2: 28 mmol/L (ref 22–32)
CREATININE: 0.89 mg/dL (ref 0.61–1.24)
Calcium: 8.9 mg/dL (ref 8.9–10.3)
GFR calc Af Amer: >60 mL/min (ref >60)
GFR calc non Af Amer: >60 mL/min (ref >60)
GLUCOSE: 106 mg/dL — AB (ref 65–99)
POTASSIUM: 4.1 mmol/L (ref 3.5–5.1)
Sodium: 135 mmol/L (ref 135–145)
Total Bilirubin: 1 mg/dL (ref 0.3–1.2)
Total Protein: 7.4 g/dL (ref 6.5–8.1)

## 2015-06-28 LAB — CBC WITH DIFFERENTIAL/PLATELET
BASOS ABS: 0 10*3/uL (ref 0–0.1)
BASOS PCT: 0 %
EOS PCT: 3 %
Eosinophils Absolute: 0.3 10*3/uL (ref 0–0.7)
HCT: 45 % (ref 40.0–52.0)
Hemoglobin: 14.4 g/dL (ref 13.0–18.0)
LYMPHS PCT: 18 %
Lymphs Abs: 2.1 10*3/uL (ref 1.0–3.6)
MCH: 28.1 pg (ref 26.0–34.0)
MCHC: 32.1 g/dL (ref 32.0–36.0)
MCV: 87.5 fL (ref 80.0–100.0)
MONO ABS: 1 10*3/uL (ref 0.2–1.0)
Monocytes Relative: 9 %
NEUTROS ABS: 8.4 10*3/uL — AB (ref 1.4–6.5)
Neutrophils Relative %: 70 %
PLATELETS: 299 10*3/uL (ref 150–440)
RBC: 5.14 MIL/uL (ref 4.40–5.90)
RDW: 14.1 % (ref 11.5–14.5)
WBC: 11.7 10*3/uL — AB (ref 3.8–10.6)

## 2015-06-28 LAB — TROPONIN I: Troponin I: 0.03 ng/mL (ref <0.031)

## 2015-06-28 SURGERY — LAPAROSCOPY, DIAGNOSTIC

## 2015-06-28 SURGERY — LAPAROSCOPY, DIAGNOSTIC
Anesthesia: General | Site: Abdomen | Wound class: Dirty or Infected

## 2015-06-28 MED ORDER — ONDANSETRON 4 MG PO TBDP: 4.0000 mg | ORAL_TABLET | Freq: Four times a day (QID) | ORAL | Status: DC | PRN

## 2015-06-28 MED ORDER — ROCURONIUM BROMIDE 100 MG/10ML IV SOLN
INTRAVENOUS | Status: DC | PRN
  Administered 2015-06-28: 10 mg via INTRAVENOUS
  Administered 2015-06-28: 40 mg via INTRAVENOUS

## 2015-06-28 MED ORDER — HEPARIN SODIUM (PORCINE) 5000 UNIT/ML IJ SOLN
5000.0000 [IU] | Freq: Three times a day (TID) | INTRAMUSCULAR | Status: DC
Start: 2015-06-28 — End: 2015-07-03
  Administered 2015-06-29 – 2015-07-03 (×13): 5000 [IU] via SUBCUTANEOUS
  Filled 2015-06-28 (×13): qty 1

## 2015-06-28 MED ORDER — LABETALOL HCL 5 MG/ML IV SOLN
INTRAVENOUS | Status: DC | PRN
  Administered 2015-06-28: 10 mg via INTRAVENOUS

## 2015-06-28 MED ORDER — EPHEDRINE SULFATE 50 MG/ML IJ SOLN
INTRAMUSCULAR | Status: DC | PRN
  Administered 2015-06-28: 5 mg via INTRAVENOUS

## 2015-06-28 MED ORDER — TAMSULOSIN HCL 0.4 MG PO CAPS
0.8000 mg | ORAL_CAPSULE | Freq: Every evening | ORAL | Status: DC
  Administered 2015-06-29 – 2015-07-02 (×4): 0.8 mg via ORAL
  Filled 2015-06-28 (×4): qty 2

## 2015-06-28 MED ORDER — SUCCINYLCHOLINE CHLORIDE 20 MG/ML IJ SOLN
INTRAMUSCULAR | Status: DC | PRN
  Administered 2015-06-28: 100 mg via INTRAVENOUS

## 2015-06-28 MED ORDER — HYDROMORPHONE HCL 1 MG/ML IJ SOLN
0.5000 mg | INTRAMUSCULAR | Status: DC | PRN
  Administered 2015-06-29 – 2015-06-30 (×6): 1 mg via INTRAVENOUS
  Filled 2015-06-28 (×7): qty 1

## 2015-06-28 MED ORDER — LEVOTHYROXINE SODIUM 150 MCG PO TABS
150.0000 ug | ORAL_TABLET | Freq: Every evening | ORAL | Status: DC
  Administered 2015-06-30 – 2015-07-02 (×3): 150 ug via ORAL
  Filled 2015-06-28 (×7): qty 1

## 2015-06-28 MED ORDER — CIPROFLOXACIN IN D5W 400 MG/200ML IV SOLN
400.0000 mg | Freq: Two times a day (BID) | INTRAVENOUS | Status: DC
  Administered 2015-06-28 – 2015-07-01 (×6): 400 mg via INTRAVENOUS
  Filled 2015-06-28 (×8): qty 200

## 2015-06-28 MED ORDER — LACTATED RINGERS IV SOLN
INTRAVENOUS | Status: DC | PRN
  Administered 2015-06-28: 22:00:00 via INTRAVENOUS

## 2015-06-28 MED ORDER — CARVEDILOL 6.25 MG PO TABS
3.1250 mg | ORAL_TABLET | Freq: Two times a day (BID) | ORAL | Status: DC
  Administered 2015-06-29 – 2015-07-03 (×9): 3.125 mg via ORAL
  Filled 2015-06-28 (×9): qty 1

## 2015-06-28 MED ORDER — NEOSTIGMINE METHYLSULFATE 10 MG/10ML IV SOLN
INTRAVENOUS | Status: DC | PRN
  Administered 2015-06-28: 5 mg via INTRAVENOUS

## 2015-06-28 MED ORDER — IOHEXOL 300 MG/ML  SOLN
100.0000 mL | Freq: Once | INTRAMUSCULAR | Status: AC | PRN
  Administered 2015-06-28: 100 mL via INTRAVENOUS

## 2015-06-28 MED ORDER — PROPOFOL 10 MG/ML IV BOLUS
INTRAVENOUS | Status: DC | PRN
  Administered 2015-06-28: 130 mg via INTRAVENOUS

## 2015-06-28 MED ORDER — ACETAMINOPHEN 325 MG PO TABS
650.0000 mg | ORAL_TABLET | Freq: Four times a day (QID) | ORAL | Status: DC | PRN
Start: 2015-06-28 — End: 2015-07-03
  Administered 2015-07-02: 650 mg via ORAL
  Filled 2015-06-28: qty 2

## 2015-06-28 MED ORDER — ACETAMINOPHEN 650 MG RE SUPP: 650.0000 mg | Freq: Four times a day (QID) | RECTAL | Status: DC | PRN

## 2015-06-28 MED ORDER — LIDOCAINE HCL (CARDIAC) 20 MG/ML IV SOLN
INTRAVENOUS | Status: DC | PRN
  Administered 2015-06-28 (×2): 100 mg via INTRAVENOUS

## 2015-06-28 MED ORDER — FENTANYL CITRATE (PF) 100 MCG/2ML IJ SOLN
INTRAMUSCULAR | Status: DC | PRN
  Administered 2015-06-28: 250 ug via INTRAVENOUS

## 2015-06-28 MED ORDER — ONDANSETRON HCL 4 MG/2ML IJ SOLN: 4.0000 mg | Freq: Four times a day (QID) | INTRAMUSCULAR | Status: DC | PRN

## 2015-06-28 MED ORDER — SODIUM CHLORIDE 0.9 % IV SOLN
INTRAVENOUS | Status: DC
  Administered 2015-06-29: 1000 mL via INTRAVENOUS
  Administered 2015-06-29 – 2015-06-30 (×3): via INTRAVENOUS

## 2015-06-28 MED ORDER — AMLODIPINE BESYLATE 5 MG PO TABS
2.5000 mg | ORAL_TABLET | Freq: Every morning | ORAL | Status: DC
  Administered 2015-06-29 – 2015-07-03 (×5): 2.5 mg via ORAL
  Filled 2015-06-28 (×5): qty 1

## 2015-06-28 MED ORDER — PIPERACILLIN-TAZOBACTAM 3.375 G IVPB
3.3750 g | Freq: Once | INTRAVENOUS | Status: AC
  Administered 2015-06-28: 3.375 g via INTRAVENOUS
  Filled 2015-06-28: qty 50

## 2015-06-28 MED ORDER — PANTOPRAZOLE SODIUM 40 MG IV SOLR
40.0000 mg | Freq: Every day | INTRAVENOUS | Status: DC
  Filled 2015-06-28: qty 40

## 2015-06-28 MED ORDER — SODIUM CHLORIDE 0.9 % IR SOLN
Status: DC | PRN
  Administered 2015-06-28: 2000 mL

## 2015-06-28 MED ORDER — ONDANSETRON HCL 4 MG/2ML IJ SOLN
INTRAMUSCULAR | Status: DC | PRN
  Administered 2015-06-28: 4 mg via INTRAVENOUS

## 2015-06-28 MED ORDER — LOSARTAN POTASSIUM 50 MG PO TABS
100.0000 mg | ORAL_TABLET | Freq: Every morning | ORAL | Status: DC
  Administered 2015-06-29 – 2015-07-02 (×4): 100 mg via ORAL
  Filled 2015-06-28 (×3): qty 2
  Filled 2015-06-28: qty 4
  Filled 2015-06-28 (×2): qty 2

## 2015-06-28 MED ORDER — GLYCOPYRROLATE 0.2 MG/ML IJ SOLN
INTRAMUSCULAR | Status: DC | PRN
Start: 2015-06-28 — End: 2015-06-28
  Administered 2015-06-28: .8 mg via INTRAVENOUS

## 2015-06-28 MED ORDER — 0.9 % SODIUM CHLORIDE (POUR BTL) OPTIME
TOPICAL | Status: DC | PRN
  Administered 2015-06-28: 50 mL

## 2015-06-28 SURGICAL SUPPLY — 58 items
ADHESIVE MASTISOL STRL (MISCELLANEOUS) IMPLANT
BLADE SURG SZ10 CARB STEEL (BLADE) ×5 IMPLANT
BULB RESERV EVAC DRAIN JP 100C (MISCELLANEOUS) ×10 IMPLANT
CANISTER SUCT 1200ML W/VALVE (MISCELLANEOUS) ×10 IMPLANT
CATH TRAY 16F METER LATEX (MISCELLANEOUS) ×5 IMPLANT
CHLORAPREP W/TINT 26ML (MISCELLANEOUS) ×10 IMPLANT
CLEANER CAUTERY TIP 5X5 PAD (MISCELLANEOUS) IMPLANT
CLIP TI LARGE 6 (CLIP) IMPLANT
CLIP TI MEDIUM 6 (CLIP) IMPLANT
CLOSURE WOUND 1/2 X4 (GAUZE/BANDAGES/DRESSINGS)
DRAIN CHANNEL JP 19F (MISCELLANEOUS) ×5 IMPLANT
DRAPE LEGGINS SURG 28X43 STRL (DRAPES) ×5 IMPLANT
DRSG TEGADERM 2-3/8X2-3/4 SM (GAUZE/BANDAGES/DRESSINGS) ×10 IMPLANT
ELECT BLADE 6.5 EXT (BLADE) ×5 IMPLANT
GLOVE BIO SURGEON STRL SZ7.5 (GLOVE) ×30 IMPLANT
GOWN STRL REUS W/ TWL LRG LVL3 (GOWN DISPOSABLE) ×9 IMPLANT
GOWN STRL REUS W/TWL LRG LVL3 (GOWN DISPOSABLE) ×6
HANDLE YANKAUER SUCT BULB TIP (MISCELLANEOUS) ×5 IMPLANT
IRRIGATION STRYKERFLOW (MISCELLANEOUS) ×3 IMPLANT
IRRIGATOR STRYKERFLOW (MISCELLANEOUS) ×5
IV NS 1000ML (IV SOLUTION) ×4
IV NS 1000ML BAXH (IV SOLUTION) ×6 IMPLANT
KIT RM TURNOVER STRD PROC AR (KITS) ×5 IMPLANT
LABEL OR SOLS (LABEL) ×5 IMPLANT
LIGASURE BLUNT 5MM 37CM (INSTRUMENTS) IMPLANT
NEEDLE HYPO 25X1 1.5 SAFETY (NEEDLE) ×5 IMPLANT
NEEDLE VERESS 14GA 120MM (NEEDLE) ×5 IMPLANT
NS IRRIG 1000ML POUR BTL (IV SOLUTION) ×5 IMPLANT
PACK COLON CLEAN CLOSURE (MISCELLANEOUS) ×5 IMPLANT
PACK LAP CHOLECYSTECTOMY (MISCELLANEOUS) ×5 IMPLANT
PAD CLEANER CAUTERY TIP 5X5 (MISCELLANEOUS)
PAD GROUND ADULT SPLIT (MISCELLANEOUS) ×5 IMPLANT
PAD PREP 24X41 OB/GYN DISP (PERSONAL CARE ITEMS) IMPLANT
PENCIL ELECTRO HAND CTR (MISCELLANEOUS) ×5 IMPLANT
RETRACTOR WOUND ALXS 18CM MED (MISCELLANEOUS) IMPLANT
RTRCTR WOUND ALEXIS O 18CM MED (MISCELLANEOUS)
SCISSORS METZENBAUM CVD 33 (INSTRUMENTS) ×5 IMPLANT
SLEEVE ENDOPATH XCEL 5M (ENDOMECHANICALS) ×10 IMPLANT
SLEEVE GASTRECTOMY 36FR VISIGI (MISCELLANEOUS) ×5 IMPLANT
SOL PREP PVP 2OZ (MISCELLANEOUS) ×5
SOLUTION PREP PVP 2OZ (MISCELLANEOUS) ×3 IMPLANT
SPONGE DRAIN TRACH 4X4 STRL 2S (GAUZE/BANDAGES/DRESSINGS) ×5 IMPLANT
SPONGE LAP 18X18 5 PK (GAUZE/BANDAGES/DRESSINGS) IMPLANT
SPONGE VERSALON 4X4 4PLY (MISCELLANEOUS) ×10 IMPLANT
STAPLER SKIN PROX 35W (STAPLE) IMPLANT
STRIP CLOSURE SKIN 1/2X4 (GAUZE/BANDAGES/DRESSINGS) IMPLANT
SURGILUBE 2OZ TUBE FLIPTOP (MISCELLANEOUS) IMPLANT
SUT MNCRL 4-0 (SUTURE) ×6
SUT MNCRL 4-0 27XMFL (SUTURE) ×9
SUT SILK 3-0 (SUTURE) ×4
SUT SILK 3-0 SH-1 18XCR BRD (SUTURE) ×6
SUT VIC AB 2-0 CT1 27 (SUTURE) ×4
SUT VIC AB 2-0 CT1 TAPERPNT 27 (SUTURE) ×6 IMPLANT
SUTURE MNCRL 4-0 27XMF (SUTURE) ×9 IMPLANT
SUTURE SILK 3-0 SH-1 18XCR BRD (SUTURE) ×6 IMPLANT
TROCAR XCEL NON-BLD 11X100MML (ENDOMECHANICALS) ×5 IMPLANT
TROCAR XCEL NON-BLD 5MMX100MML (ENDOMECHANICALS) ×5 IMPLANT
TUBING INSUFFLATOR HEATED (MISCELLANEOUS) ×5 IMPLANT

## 2015-06-28 NOTE — ED Provider Notes (Signed)
[facility] Emergency Department Provider Note  ____________________________________________  Time seen: [id number]   I have reviewed the triage vital signs and the nursing notes.   HISTORY  Chief Complaint Abdominal Pain     HPI David Morgan is a 81 y.o. male who has had lower left quadrant abdominal pain for 2-3 weeks. He has also had a bad cough and some mild congestion. Due to the cough and congestion he was started on Levaquin. Due to the abdominal pain he was sent for CT scan this morning. The CT scan shows an abscess adjacent to the sigmoid colon and significant free air up through the belly into the periumbilical area.  With this result, he was sent to the emergency department for further evaluation and care.   The patient denies any known history for diverticulosis or diverticulitis. He has had a periumbilical hernia repair and bladder surgery.  In the emergency department, the patient is alert and communicative. He provides the history above. He does have some tenderness in his left lower quadrant and periumbilical area, but overall looks comfortable. He denies any fever, nausea, vomiting, or diarrhea.    Past Medical History  Diagnosis Date  . Lesion of bladder   . History of bladder cancer   . Hypertension   . Hyperlipidemia   . Peripheral vascular disease ([place])   . Claudication ([place])   . Emphysema/COPD ([place])   . Mild asthma     NO INHALER  . Short of breath on exertion   . Hypothyroidism   . Urgency of urination   . Nocturia   . Arthritis   . Wears glasses   . History of TIA (transient ischemic attack)     2012--  NO RESIDUAL (PER SCAN HAD A PREVIOUS TIA BEFORE 2012)  . History of carotid artery stenosis   . Cancer ([place])     There are no active problems to display for this patient.   Past Surgical History  Procedure Laterality Date  . Transurethral resection of bladder tumor  1990  . Total knee arthroplasty Right 2004  .  Carotid endarterectomy Right 2005  . Carpal tunnel release Bilateral 2002  &  2007  . Right shoulder  surgery  2005  . Orif hip fracture Left 2008    RETAINED HARDWARE  . Umbilical hernia repair   2009  &  2011  . Inguinal hernia repair  YRS AGO  . Cataract extraction w/ intraocular lens  implant, bilateral    . Cystoscopy with biopsy N/A 12/26/2013    Procedure: CYSTOSCOPY WITH BLADDER BIOPSY;  Surgeon: Claybon Jabs, MD;  Location: [facility];  Service: Urology;  Laterality: N/A;  . Cystoscopy w/ retrogrades Bilateral 12/26/2013    Procedure: BILATERAL RETROGRADE PYELOGRAM;  Surgeon: Claybon Jabs, MD;  Location: [facility];  Service: Urology;  Laterality: Bilateral;    Current Outpatient Rx  Name  Route  Sig  Dispense  Refill  . amLODipine (NORVASC) 2.5 MG tablet   Oral   Take 2.5 mg by mouth every morning.         [name] aspirin EC 325 MG tablet   Oral   Take 325 mg by mouth daily.         [name] atorvastatin (LIPITOR) 40 MG tablet   Oral   Take 40 mg by mouth every evening.         . carvedilol (COREG) 3.125 MG tablet   Oral   Take  3.125 mg by mouth 2 (two) times daily with a meal.         . fesoterodine (TOVIAZ) 4 MG TB24 tablet   Oral   Take 4 mg by mouth every evening.         . fexofenadine (ALLEGRA) 180 MG tablet   Oral   Take 180 mg by mouth every morning.         . gabapentin (NEURONTIN) 100 MG capsule   Oral   Take 100 mg by mouth 2 (two) times daily.         [name] HYDROcodone-acetaminophen (NORCO) 7.5-325 MG per tablet   Oral   Take 1-2 tablets by mouth every 4 (four) hours as needed for moderate pain. Maximum dose per 24 hours - 8 pills   20 tablet   0   . levothyroxine (SYNTHROID, LEVOTHROID) 150 MCG tablet   Oral   Take 150 mcg by mouth every evening.         [name] losartan (COZAAR) 100 MG tablet   Oral   Take 100 mg by mouth every morning.         . Multiple Vitamin (MULTIVITAMIN) tablet   Oral   Take 1  tablet by mouth daily.         . phenazopyridine (PYRIDIUM) 200 MG tablet   Oral   Take 1 tablet (200 mg total) by mouth 3 (three) times daily as needed for pain.   30 tablet   0   . tamsulosin (FLOMAX) 0.4 MG CAPS capsule   Oral   Take 0.8 mg by mouth every evening.           Allergies Adhesive and Augmentin  No family history on file.  Social History Social History  Substance Use Topics  . Smoking status: Former Smoker -- 1.00 packs/day for 40 years    Types: Cigarettes    Quit date: 12/21/1993  . Smokeless tobacco: Never Used  . Alcohol Use: Yes     Comment: RARE    Review of Systems  Constitutional: Negative for fatigue. ENT: Negative for congestion. Cardiovascular: Negative for chest pain. Respiratory: notable for cough over the past 2 weeks. Gastrointestinal:  Positive for abdominal pain. See history of present illness Genitourinary: Negative for dysuria. Musculoskeletal: No myalgias or injuries. Skin: Negative for rash. Neurological: Negative for headache or focal weakness   10-point ROS otherwise negative.  ____________________________________________   PHYSICAL EXAM:  VITAL SIGNS: ED Triage Vitals  Enc Vitals Group     BP 06/28/15 1414 172/117 mmHg     Pulse Rate 06/28/15 1414 81     Resp 06/28/15 1414 18     Temp 06/28/15 1414 97.9 F (36.6 C)     Temp Source 06/28/15 1414 Oral     SpO2 06/28/15 1414 98 %     Weight 06/28/15 1414 220 lb (99.791 kg)     Height 06/28/15 1414 5\' 9"  (1.753 m)     Head Cir --      Peak Flow --      Pain Score 06/28/15 1415 7     Pain Loc --      Pain Edu? --      Excl. in [place]? --     Constitutional: Alert and oriented. Well appearing and in no distress. ENT   Head: Normocephalic and atraumatic.   Nose: No congestion/rhinnorhea.       Mouth: No erythema, no swelling   Cardiovascular: Normal rate, regular rhythm, no murmur noted  Respiratory:  Normal respiratory effort, no tachypnea.    Breath  sounds are clear and equal bilaterally.  Gastrointestinal:  Mild distention. Mild to moderate discomfort in the periumbilical area and more moderate in the left lower quadrant.  Back: No muscle spasm, no tenderness, no CVA tenderness. Musculoskeletal: No deformity noted. Nontender with normal range of motion in all extremities.  No noted edema. Neurologic:  Communicative. Normal appearing spontaneous movement in all 4 extremities. No gross focal neurologic deficits are appreciated.  Skin:  Skin is warm, dry. No rash noted. Psychiatric: Mood and affect are normal. Speech and behavior are normal.  ____________________________________________    LABS (pertinent positives/negatives)  Labs Reviewed  CULTURE, BLOOD (ROUTINE X 2)  CULTURE, BLOOD (ROUTINE X 2)  CBC WITH DIFFERENTIAL/PLATELET  COMPREHENSIVE METABOLIC PANEL  TROPONIN I    ____________________________________________    RADIOLOGY  CT abdomen, prior to ED arrival: IMPRESSION: 1. 9.5 cm diverticular abscess in the sigmoid colon. 2. There is perforation of the bowel with extensive pneumoperitoneum. 3. Free air extends into the subcutaneous tissues just above the umbilicus. 4. Atherosclerosis. 5. Moderate spondylosis of the lumbar spine.  ____________________________________________   PROCEDURES  CRITICAL CARE Performed by: Ahmed Prima   Total critical care time: 30 minutes due to the severity of this patient's pathology with perforated bowel and significant free air. I spent extensive time describing the condition with the patient and his wife, which they appreciated, and spoke with general surgery for ongoing care.  Critical care time was exclusive of separately billable procedures and treating other patients.  Critical care was necessary to treat or prevent imminent or life-threatening deterioration.  Critical care was time spent personally by me on the following activities: development of treatment plan  with patient and/or surrogate as well as nursing, discussions with consultants, evaluation of patient's response to treatment, examination of patient, obtaining history from patient or surrogate, ordering and performing treatments and interventions, ordering and review of laboratory studies, ordering and review of radiographic studies, pulse oximetry and re-evaluation of patient's condition.  ____________________________________________   INITIAL IMPRESSION / ASSESSMENT AND PLAN / ED COURSE  Pertinent labs & imaging results that were available during my care of the patient were reviewed by me and considered in my medical decision making (see chart for details).   Worrisome perforated bowel with diverticular abscess. We will start Zosyn after obtaining blood cultures. I have called general surgery, Chauncey Reading, who has reviewed the CT and will see the patient the emergency department.  ____________________________________________   FINAL CLINICAL IMPRESSION(S) / ED DIAGNOSES  Final diagnoses:  Perforated bowel ([place])  Diverticulosis of large intestine without hemorrhage  Intra-abdominal abscess ([place])      Ahmed Prima, MD 06/28/15 2109

## 2015-06-28 NOTE — ED Notes (Signed)
LLQ pain X 1 week. Outpatient CT exam today. Reported that pt has perforation by radiologist that called this triage nurse. PT denies any other sx besides pain.

## 2015-06-28 NOTE — H&P (Signed)
Surgery History and Physical  CC: LLQ pain x 3 weeks  HPI: David Morgan is a pleasant 81 yo M with a history of bladder cancer s/p resection and umbilical hernia repair x 2 who presents with 3 weeks of LLQ pain.  Began acutely, did not improve.  Thought that it was associated with a cough that he had around this time.  + Chills.  Saw his PCP last week and then underwent CT scan today which showed large pericolonic abscess and significant intraabdominal free air and subcutaneous air.  Currently without significant pain.  No fevers, chest pain, shortness of breath, cough, nausea/vomiting, diarrhea/constipation, dysuria/hematuria.  Active Ambulatory Problems    Diagnosis Date Noted  . No Active Ambulatory Problems   Resolved Ambulatory Problems    Diagnosis Date Noted  . No Resolved Ambulatory Problems   Past Medical History  Diagnosis Date  . Lesion of bladder   . History of bladder cancer   . Hypertension   . Hyperlipidemia   . Peripheral vascular disease ([place])   . Claudication ([place])   . Emphysema/COPD ([place])   . Mild asthma   . Short of breath on exertion   . Hypothyroidism   . Urgency of urination   . Nocturia   . Arthritis   . Wears glasses   . History of TIA (transient ischemic attack)   . History of carotid artery stenosis   . Cancer [facility])    Past Surgical History  Procedure Laterality Date  . Transurethral resection of bladder tumor  1990  . Total knee arthroplasty Right 2004  . Carotid endarterectomy Right 2005  . Carpal tunnel release Bilateral 2002  &  2007  . Right shoulder  surgery  2005  . Orif hip fracture Left 2008    RETAINED HARDWARE  . Umbilical hernia repair   2009  &  2011  . Inguinal hernia repair  YRS AGO  . Cataract extraction w/ intraocular lens  implant, bilateral    . Cystoscopy with biopsy N/A 12/26/2013    Procedure: CYSTOSCOPY WITH BLADDER BIOPSY;  Surgeon: Claybon Jabs, MD;  [facility];  Service: Urology;   Laterality: N/A;  . Cystoscopy w/ retrogrades Bilateral 12/26/2013    Procedure: BILATERAL RETROGRADE PYELOGRAM;  Surgeon: Claybon Jabs, MD;  Location: [facility];  Service: Urology;  Laterality: Bilateral;     Medication List    ASK your doctor about these medications        amLODipine 2.5 MG tablet  Commonly known as:  NORVASC  Take 2.5 mg by mouth every morning.     aspirin EC 325 MG tablet  Take 325 mg by mouth daily.     atorvastatin 40 MG tablet  Commonly known as:  LIPITOR  Take 40 mg by mouth every evening.     carvedilol 3.125 MG tablet  Commonly known as:  COREG  Take 3.125 mg by mouth 2 (two) times daily with a meal.     gabapentin 100 MG capsule  Commonly known as:  NEURONTIN  Take 100 mg by mouth 3 (three) times daily.     levofloxacin 500 MG tablet  Commonly known as:  LEVAQUIN  Take 1 tablet by mouth daily.     levothyroxine 150 MCG tablet  Commonly known as:  SYNTHROID, LEVOTHROID  Take 150 mcg by mouth every evening.     losartan 100 MG tablet  Commonly known as:  COZAAR  Take 100 mg by mouth every  morning.     meloxicam 15 MG tablet  Commonly known as:  MOBIC  Take 15 mg by mouth daily.     multivitamin tablet  Take 1 tablet by mouth daily.     predniSONE 10 MG tablet  Commonly known as:  DELTASONE  Take 1 tablet by mouth daily. 3,3,3,2,2,2,1,1,1     tamsulosin 0.4 MG Caps capsule  Commonly known as:  FLOMAX  Take 0.8 mg by mouth every evening.       Allergies  Allergen Reactions  . Adhesive [Tape] Rash  . Augmentin [Amoxicillin-Pot Clavulanate] Itching and Rash   Social History   Social History  . Marital Status: Married    Spouse Name: N/A  . Number of Children: N/A  . Years of Education: N/A   Occupational History  . Not on file.   Social History Main Topics  . Smoking status: Former Smoker -- 1.00 packs/day for 40 years    Types: Cigarettes    Quit date: 12/21/1993  . Smokeless tobacco: Never Used  .  Alcohol Use: Yes     Comment: RARE  . Drug Use: No  . Sexual Activity: Not on file   Other Topics Concern  . Not on file   Social History Narrative   No family history on file.   ROS: Full ROS obtained, pertinent positives and negatives as above  Blood pressure 186/94, pulse 71, temperature 97.9 F (36.6 C), temperature source Oral, resp. rate 20, height 5\' 9"  (1.753 m), weight 220 lb (99.791 kg), SpO2 98 %. GEN: NAD/A&Ox3 FACE: no obvious facial trauma, normal external nose, normal external ears EYES: no scleral icterus, no conjunctivitis HEAD: normocephalic atraumatic CV: RRR, no MRG RESP: moving air well, lungs clear ABD: soft, significantly tender LLQ, nondistended, no subcutaneous emphysema EXT: moving all ext well, strength 5/5 NEURO: cnII-XII grossly intact, sensation intact all 4 ext  Labs: Reviewed, significant for WBC 11.7, neutrophils 70  Imaging: Reviewed, significant for diverticulosis, large pericolonic abscess, extensive pneumotosis and subq emphysema  A/P 81 yo M with likely diverticular abscess, pneumoperitoneum.  Hemodynamically stable, mildly elevated WBC.  Concern for persistent leak due to significant pneumoperitoneum, soft tissue air is concerning as well.  I feel that this likely could not be treated conservatively with perc drainage and will likely require surgery.  Will admit for now, IVF, IV abx and discuss timing of probable surgery.

## 2015-06-28 NOTE — Transfer of Care (Signed)
Immediate Anesthesia Transfer of Care Note  Patient: David Morgan  Procedure(s) Performed: Procedure(s): LAPAROSCOPY DIAGNOSTIC LAPAROSCOPIC LYSIS OF ADHESIONS GUIDED DRAIN W CATHETER PLACEMENT (N/A)  Patient Location: PACU  Anesthesia Type:General  Level of Consciousness: awake, alert , oriented and patient cooperative  Airway & Oxygen Therapy: Patient Spontanous Breathing and Patient connected to nasal cannula oxygen  Post-op Assessment: Report given to RN and Post -op Vital signs reviewed and stable  Post vital signs: Reviewed and stable  Last Vitals:  Filed Vitals:   06/28/15 2009  BP: 163/83  Pulse: 79  Temp:   Resp:     Complications: No apparent anesthesia complications

## 2015-06-28 NOTE — Progress Notes (Signed)
Visit with the patient and discussed his surgical options. Given the chronicity of his abdominal pain and the large amounts of free air in his abdomen discussed that operative intervention tonight is warranted. He is currently resting in bed in no acute distress however this could worsen acutely at any point. Discussed options of laparoscopic versus open versus percutaneous intervention. Per conversation we decided to proceed with a diagnostic laparoscopy to visualize whether or not there is any ongoing drainage from his perforated diverticular disease. If there is no visible drainage from the colon we'll do a washout of his pelvic abscess and placement of drains all laparoscopically if possible. Should to be a visualized perforation the colon will then proceed with a sigmoid colectomy. A sigmoid colectomy is performed in the setting of this acute abscess discussed with patient the likely need of an end colostomy. Patient and his wife both voiced understanding. The risks, benefits, alternatives of the operation were discussed in detail to both voiced understanding wished proceed. Plan for operative intervention tonight.  Clayburn Pert, MD FACS General Surgeon [facility]

## 2015-06-28 NOTE — Anesthesia Preprocedure Evaluation (Addendum)
Anesthesia Evaluation  Patient identified by MRN, date of birth, ID band Patient awake    Reviewed: Allergy & Precautions, NPO status , Patient's Chart, lab work & pertinent test results, reviewed documented beta blocker date and time   Airway Mallampati: III  TM Distance: >3 FB     Dental  (+) Chipped   Pulmonary shortness of breath, asthma , COPD, former smoker,           Cardiovascular hypertension, Pt. on medications and Pt. on home beta blockers + Peripheral Vascular Disease       Neuro/Psych    GI/Hepatic   Endo/Other  Hypothyroidism   Renal/GU      Musculoskeletal  (+) Arthritis ,   Abdominal   Peds  Hematology   Anesthesia Other Findings EKG ok.  Reproductive/Obstetrics                            Anesthesia Physical Anesthesia Plan  ASA: III  Anesthesia Plan: General   Post-op Pain Management:    Induction: Intravenous  Airway Management Planned: Oral ETT  Additional Equipment:   Intra-op Plan:   Post-operative Plan:   Informed Consent: I have reviewed the patients History and Physical, chart, labs and discussed the procedure including the risks, benefits and alternatives for the proposed anesthesia with the patient or authorized representative who has indicated his/her understanding and acceptance.     Plan Discussed with: CRNA  Anesthesia Plan Comments:         Anesthesia Quick Evaluation

## 2015-06-28 NOTE — Anesthesia Procedure Notes (Signed)
Procedure Name: Intubation Date/Time: 06/28/2015 10:35 PM Performed by: Rosaria Ferries, Artemus Romanoff Pre-anesthesia Checklist: Patient identified, Emergency Drugs available, Suction available and Patient being monitored Patient Re-evaluated:Patient Re-evaluated prior to inductionOxygen Delivery Method: Circle system utilized Preoxygenation: Pre-oxygenation with 100% oxygen Intubation Type: IV induction Laryngoscope Size: Mac and 3 Grade View: Grade I Tube type: Oral Tube size: 7.0 mm Number of attempts: 1 Placement Confirmation: ETT inserted through vocal cords under direct vision,  positive ETCO2 and breath sounds checked- equal and bilateral Secured at: 22 cm Tube secured with: Tape Dental Injury: Teeth and Oropharynx as per pre-operative assessment

## 2015-06-28 NOTE — Brief Op Note (Signed)
06/28/2015  11:57 PM  PATIENT:  David Morgan  81 y.o. male  PRE-OPERATIVE DIAGNOSIS:  perforated diverticulitis  POST-OPERATIVE DIAGNOSIS:  pelvis abcess  PROCEDURE:  Procedure(s): LAPAROSCOPY DIAGNOSTIC LAPAROSCOPIC LYSIS OF ADHESIONS GUIDED DRAIN W CATHETER PLACEMENT (N/A)  SURGEON:  Surgeon(s) and Role:    * Clayburn Pert, MD - Primary    * Marlyce Huge, MD - Assisting  PHYSICIAN ASSISTANT:   ASSISTANTS: Chauncey Reading, MD   ANESTHESIA:   general  EBL:  Total I/O In: 750 [I.V.:750] Out: 220 [Urine:200; Blood:20]  BLOOD ADMINISTERED:none  DRAINS: (2) Jackson-Pratt drain(s) with closed bulb suction in the right paracolic gutter and pelvis   LOCAL MEDICATIONS USED:  MARCAINE   , XYLOCAINE  and Amount: 10 ml  SPECIMEN:  No Specimen  DISPOSITION OF SPECIMEN:  N/A  COUNTS:  YES  TOURNIQUET:  * No tourniquets in log *  DICTATION: .Dragon Dictation  PLAN OF CARE: Admit to inpatient   PATIENT DISPOSITION:  PACU - hemodynamically stable.   Delay start of Pharmacological VTE agent (>24hrs) due to surgical blood loss or risk of bleeding: no

## 2015-06-29 ENCOUNTER — Encounter: Payer: Self-pay | Admitting: General Surgery

## 2015-06-29 LAB — CBC
HEMATOCRIT: 41.4 % (ref 40.0–52.0)
Hemoglobin: 13.4 g/dL (ref 13.0–18.0)
MCH: 28.1 pg (ref 26.0–34.0)
MCHC: 32.4 g/dL (ref 32.0–36.0)
MCV: 86.8 fL (ref 80.0–100.0)
PLATELETS: 284 10*3/uL (ref 150–440)
RBC: 4.78 MIL/uL (ref 4.40–5.90)
RDW: 14 % (ref 11.5–14.5)
WBC: 16.9 10*3/uL — ABNORMAL HIGH (ref 3.8–10.6)

## 2015-06-29 LAB — COMPREHENSIVE METABOLIC PANEL
ALBUMIN: 2.6 g/dL — AB (ref 3.5–5.0)
ALT: 35 U/L (ref 17–63)
AST: 21 U/L (ref 15–41)
Alkaline Phosphatase: 58 U/L (ref 38–126)
Anion gap: 7 (ref 5–15)
BUN: 24 mg/dL — AB (ref 6–20)
CHLORIDE: 101 mmol/L (ref 101–111)
CO2: 26 mmol/L (ref 22–32)
CREATININE: 0.88 mg/dL (ref 0.61–1.24)
Calcium: 8.1 mg/dL — ABNORMAL LOW (ref 8.9–10.3)
GFR calc non Af Amer: >60 mL/min (ref >60)
Glucose, Bld: 139 mg/dL — ABNORMAL HIGH (ref 65–99)
Potassium: 4.4 mmol/L (ref 3.5–5.1)
SODIUM: 134 mmol/L — AB (ref 135–145)
Total Bilirubin: 1 mg/dL (ref 0.3–1.2)
Total Protein: 6 g/dL — ABNORMAL LOW (ref 6.5–8.1)

## 2015-06-29 MED ORDER — FENTANYL CITRATE (PF) 100 MCG/2ML IJ SOLN: 25.0000 ug | INTRAMUSCULAR | Status: DC | PRN

## 2015-06-29 MED ORDER — ONDANSETRON HCL 4 MG/2ML IJ SOLN: 4.0000 mg | Freq: Once | INTRAMUSCULAR | Status: DC | PRN

## 2015-06-29 MED ORDER — LIDOCAINE HCL 1 % IJ SOLN
INTRAMUSCULAR | Status: DC | PRN
  Administered 2015-06-28: 4 mL

## 2015-06-29 MED ORDER — METRONIDAZOLE IN NACL 5-0.79 MG/ML-% IV SOLN
500.0000 mg | Freq: Three times a day (TID) | INTRAVENOUS | Status: DC
  Administered 2015-06-29 – 2015-07-01 (×8): 500 mg via INTRAVENOUS
  Filled 2015-06-29 (×11): qty 100

## 2015-06-29 MED ORDER — BUPIVACAINE HCL (PF) 0.5 % IJ SOLN
INTRAMUSCULAR | Status: DC | PRN
  Administered 2015-06-28: 4 mL

## 2015-06-29 MED ORDER — PANTOPRAZOLE SODIUM 40 MG PO TBEC
40.0000 mg | DELAYED_RELEASE_TABLET | Freq: Every day | ORAL | Status: DC
  Administered 2015-06-29 – 2015-07-02 (×4): 40 mg via ORAL
  Filled 2015-06-29 (×4): qty 1

## 2015-06-29 NOTE — Progress Notes (Signed)
1 Day Post-Op   Subjective:  Patient underwent laparoscopic drainage of pelvic abscess last night. Tolerated procedure well. Denies any significant pain this AM. Denies any flatus. Tolerating liquids.  Vital signs in last 24 hours: Temp:  [97.2 F (36.2 C)-99 F (37.2 C)] 99 F (37.2 C) (11/11 0617) Pulse Rate:  [58-94] 84 (11/11 0617) Resp:  [9-22] 18 (11/11 0137) BP: (117-191)/(61-123) 141/67 mmHg (11/11 0617) SpO2:  [94 %-100 %] 94 % (11/11 0617) Weight:  [97.206 kg (214 lb 4.8 oz)-99.791 kg (220 lb)] 97.206 kg (214 lb 4.8 oz) (11/10 2000)    Intake/Output from previous day: 11/10 0701 - 11/11 0700 In: 1428.6 [I.V.:1428.6] Out: 1055 [Urine:775; Drains:260; Blood:20]   GEN: NAD RESP: CTA CV: RRR GI: Soft, nontender, well appoximated laparoscopic incisions, JP drains with serosanguinous output.   Lab Results:  CBC  Recent Labs  06/28/15 1504 06/29/15 0544  WBC 11.7* 16.9*  HGB 14.4 13.4  HCT 45.0 41.4  PLT 299 284   CMP     Component Value Date/Time   NA 134* 06/29/2015 0544   K 4.4 06/29/2015 0544   CL 101 06/29/2015 0544   CO2 26 06/29/2015 0544   GLUCOSE 139* 06/29/2015 0544   BUN 24* 06/29/2015 0544   CREATININE 0.88 06/29/2015 0544   CALCIUM 8.1* 06/29/2015 0544   PROT 6.0* 06/29/2015 0544   ALBUMIN 2.6* 06/29/2015 0544   AST 21 06/29/2015 0544   ALT 35 06/29/2015 0544   ALKPHOS 58 06/29/2015 0544   BILITOT 1.0 06/29/2015 0544   GFRNONAA >60 06/29/2015 0544   GFRAA >60 06/29/2015 0544   PT/INR No results for input(s): LABPROT, INR in the last 72 hours.  Studies/Results: Ct Abdomen Pelvis W Contrast  06/28/2015  CLINICAL DATA:  Left lower quadrant abdominal pain over the last 1-2 weeks. EXAM: CT ABDOMEN AND PELVIS WITH CONTRAST TECHNIQUE: Multidetector CT imaging of the abdomen and pelvis was performed using the standard protocol following bolus administration of intravenous contrast. CONTRAST:  117mL OMNIPAQUE IOHEXOL 300 MG/ML  SOLN COMPARISON:   CT of the abdomen and pelvis 04/21/2011 FINDINGS: Lower chest: The lung bases are clear. The heart size is normal. Mitral annular calcifications are present. No significant pleural or pericardial effusion is present. Hepatobiliary: A 6 mm benign appearing cyst is noted at the inferior aspect of the right lobe of the liver. Layering sludge or small stones are present at the neck of the gallbladder. There is no inflammation about the gallbladder. The common bile duct is within normal limits. Pancreas: Negative Spleen: Within normal limits Adrenals/Urinary Tract: The adrenal glands are within normal limits bilaterally. The kidneys and ureters are unremarkable. Urinary bladder is within normal limits. Stomach/Bowel: The stomach is within normal limits. The duodenum and small bowel are unremarkable. Extensive colonic diverticulosis is noted. A contained collection with an air-fluid level adjacent to the sigmoid colon measures 9.5 x 5.8 x 7.1 cm. This is compatible with a focal diverticular abscess. There is extensive free air throughout the abdomen with perforation into the subcutaneous soft tissues just above the umbilicus. The more proximal colon is within normal limits. The appendix is visualized and normal. Vascular/Lymphatic: No significant adenopathy or free fluid is present. Reproductive: Unremarkable Other: Diffuse pneumoperitoneum is present. Musculoskeletal: Spinal augmentation is noted at L3. Marked degenerative changes are present in the lower lumbar spine with a vacuum disc at L3-4, L4-5, and L5-S1. The SI joints are fused bilaterally. Rightward curvature is present in the thoracolumbar spine. IMPRESSION: 1. 9.5 cm  diverticular abscess in the sigmoid colon. 2. There is perforation of the bowel with extensive pneumoperitoneum. 3. Free air extends into the subcutaneous tissues just above the umbilicus. 4. Atherosclerosis. 5. Moderate spondylosis of the lumbar spine. I spoke with the CT technologist who  instructed the patient to go straight to the [facility] Emergency Room. He will be transported there by his wife via private vehicle. I have also informed the triage nurse of his arrival. These results were called by telephone at the time of interpretation on 06/28/2015 at 2:07 pm to Dr. [name] , who verbally acknowledged these results. Electronically Signed   By: San Morelle M.D.   On: 06/28/2015 14:05    Assessment/Plan: 81 y/o M s/p laparoscopic drainage of pelvic abscess secondary to likely diverticulitis Discussed that in 40 of patients drainage should allow for resolution of the infection and make further workup with elective resection possible. Also discussed that there is still a chance that he may require an urgent sigmoid colectomy should his colon start leaking succus again. Tolerating PO but keep on liquids for now. Encourage ambulation and IS usage.   Clayburn Pert, MD [facility] General Surgeon [facility] Surgical 06/29/2015

## 2015-06-29 NOTE — Anesthesia Postprocedure Evaluation (Signed)
  Anesthesia Post-op Note  Patient: David Morgan  Procedure(s) Performed: Procedure(s): LAPAROSCOPY DIAGNOSTIC LAPAROSCOPIC LYSIS OF ADHESIONS GUIDED DRAIN W CATHETER PLACEMENT (N/A)  Anesthesia type:General  Patient location: PACU  Post pain: Pain level controlled  Post assessment: Post-op Vital signs reviewed, Patient's Cardiovascular Status Stable, Respiratory Function Stable, Patent Airway and No signs of Nausea or vomiting  Post vital signs: Reviewed and stable  Last Vitals:  Filed Vitals:   06/29/15 1150  BP: 134/59  Pulse: 75  Temp: 37.2 C  Resp: 18    Level of consciousness: awake, alert  and patient cooperative  Complications: No apparent anesthesia complications

## 2015-06-29 NOTE — Care Management Important Message (Signed)
Important Message  Patient Details  Name: David Morgan MRN: [id number] Date of Birth: May 20, 1925   Medicare Important Message Given:  Yes    Alvie Heidelberg, RN 06/29/2015, 1:09 PM

## 2015-06-29 NOTE — Progress Notes (Signed)
Surgery Progress Note  S: No acute issues O:Blood pressure 134/59, pulse 75, temperature 98.9 F (37.2 C), temperature source Oral, resp. rate 18, height 5\' 9"  (1.753 m), weight 214 lb 4.8 oz (97.206 kg), SpO2 95 %. GEN: NAD/A&Ox3 ABD: soft, min tender, nondistended, JP serosang  WBC 16  A/P 81 yo s/p laparoscopic drainage of abdominal abscess, doing well - IV abx - liquids for now

## 2015-06-29 NOTE — Op Note (Signed)
Diagnostic laparoscopy with pelvic drain placement   [name] Date of operation:  06/29/2015  Indications: The patient presented with a history of  abdominal pain. Workup has revealed findings consistent with ruptured diverticulitis.  Pre-operative Diagnosis: Ruptured diverticulitis  Post-operative Diagnosis: Pelvic abscess  Surgeon: Juanda Crumble T. Adonis Huguenin, MD, FACS  Anesthesia: General with endotracheal tube  Procedure Details  The patient was seen again in the preop area. The options of surgery versus observation were reviewed with the patient and/or family. The risks of bleeding, infection, recurrence of symptoms, potential for an open procedure, bowel injury, abscess or infection, were all reviewed as well. The patient was taken to Operating Room, identified as David Morgan and the procedure verified as diagnostic laparoscopy. A Time Out was held and the above information confirmed.  The patient was placed in the supine position and general anesthesia was induced.  Antibiotic prophylaxis was administered and VT E prophylaxis was in place. A Foley catheter was placed by the nursing staff. The patient was placed in the low lithotomy position  The abdomen was prepped and draped in a sterile fashion. An incision was made in the right upper quadrant in the midclavicular line. A Veress needle was placed and pneumoperitoneum was obtained. A 5 mm trocar port was placed without difficulty and the abdominal cavity was explored.  Under direct vision a 5 mm right lower quadrant port was placed and another 5 mm left lateral port was placed all under direct vision.    Multiple adhesions to the anterior abdominal wall were visualized. These appeared to be associated to his prior umbilical hernia repairs as well as recurrent umbilical hernia. Using comminution of blunt dissection, sharp dissection, electrocautery these adhesions were taken down from the anterior abdominal wall. There were  numerous inflammatory adhesions into the pelvis that came down easily with blunt dissection. After doing so a large abscess cavity was visualized going into the pelvis. The cavity was entered into and copiously irrigated with saline irrigation.  The sigmoid colon was identified and noted to be hyperemic and associated with the large pelvic abscess. There was no evidence of active leaking from the sigmoid colon upon inspection. At this point the decision was made to place drains into the left paracolic gutter and pelvis. 27 [place] round [place] drains are brought up to the field placed laparoscopically in the abdomen with the left pericolic gutter drain coming out the left upper quadrant trocar site and the pelvic drain coming out the right lower quadrant trocar site. No replace these an additional 5 mm trocar was placed in the left lower quadrant. There is no evidence of active bleeding or spillage of enteric contents at the completion of this procedure  The laparoscopic trochars removed under direct visualization and the pneumoperitoneum was released. The drain to person and place with a 3-0 nylon in a Roman sandal fashion. The laparoscopic incisions were closed with a septic left 4 Monocryl and dressings of Mastisol and Steri-Strips and gauze were placed over these. The patient was then awoken from general endotracheal anesthesia.  The patient tolerated the procedure well, there were no complications. The sponge lap and needle count were correct at the end of the procedure.  The patient was taken to the recovery room in stable condition to be admitted for continued care.  Findings: Pelvic abscess  Estimated Blood Loss: 10 mL's                  Specimens: None  Complications:  None                  Clayburn Pert MD, FACS

## 2015-06-29 NOTE — Care Management (Signed)
Spoke with patient and spouse for discharge planning. patient is alert and oriented. Spouse stated that they both use walkers at home and thier house is on one level.Both patient and spouse are still driving. Denies issues with medications. PCP is Dr [name]. Continue to follow no CM needs anticipated at this time

## 2015-06-29 NOTE — Progress Notes (Signed)
81 yo male ordered pantoprazole 40mg  IV QHS.   Allergies: Tape, Augmentin    Patient is tolerating other oral medications and liquid diet, will continue patient on pantoprazole 40mg  PO QHS.

## 2015-06-30 LAB — CBC
HCT: 41.8 % (ref 40.0–52.0)
HEMOGLOBIN: 14 g/dL (ref 13.0–18.0)
MCH: 29.5 pg (ref 26.0–34.0)
MCHC: 33.4 g/dL (ref 32.0–36.0)
MCV: 88.3 fL (ref 80.0–100.0)
Platelets: 239 10*3/uL (ref 150–440)
RBC: 4.74 MIL/uL (ref 4.40–5.90)
RDW: 14.2 % (ref 11.5–14.5)
WBC: 10.3 10*3/uL (ref 3.8–10.6)

## 2015-06-30 LAB — BASIC METABOLIC PANEL
ANION GAP: 6 (ref 5–15)
BUN: 17 mg/dL (ref 6–20)
CALCIUM: 8 mg/dL — AB (ref 8.9–10.3)
CHLORIDE: 105 mmol/L (ref 101–111)
CO2: 27 mmol/L (ref 22–32)
CREATININE: 0.77 mg/dL (ref 0.61–1.24)
GFR calc non Af Amer: >60 mL/min (ref >60)
Glucose, Bld: 128 mg/dL — ABNORMAL HIGH (ref 65–99)
Potassium: 3.8 mmol/L (ref 3.5–5.1)
Sodium: 138 mmol/L (ref 135–145)

## 2015-06-30 NOTE — Progress Notes (Signed)
Surgery Progress Note  S: + flatus, tolerating liquids O:Blood pressure 183/85, pulse 83, temperature 98.4 F (36.9 C), temperature source Oral, resp. rate 18, height 5\' 9"  (1.753 m), weight 214 lb 4.8 oz (97.206 kg), SpO2 96 %. GEN: NAD/A&Ox3 ABD: soft, min tender, nondistended, incisions c/d/i, JP serosang  A/P 81 yo s/p laparoscopic abscess drainage for peridiverticular abscess, doing well - regular diet - ivf to 50

## 2015-07-01 LAB — CBC
HEMATOCRIT: 43.4 % (ref 40.0–52.0)
Hemoglobin: 14 g/dL (ref 13.0–18.0)
MCH: 28.3 pg (ref 26.0–34.0)
MCHC: 32.3 g/dL (ref 32.0–36.0)
MCV: 87.4 fL (ref 80.0–100.0)
PLATELETS: 230 10*3/uL (ref 150–440)
RBC: 4.97 MIL/uL (ref 4.40–5.90)
RDW: 14.2 % (ref 11.5–14.5)
WBC: 9.8 10*3/uL (ref 3.8–10.6)

## 2015-07-01 LAB — BASIC METABOLIC PANEL
ANION GAP: 5 (ref 5–15)
BUN: 14 mg/dL (ref 6–20)
CALCIUM: 8.1 mg/dL — AB (ref 8.9–10.3)
CO2: 24 mmol/L (ref 22–32)
Chloride: 104 mmol/L (ref 101–111)
Creatinine, Ser: 0.73 mg/dL (ref 0.61–1.24)
Glucose, Bld: 126 mg/dL — ABNORMAL HIGH (ref 65–99)
Potassium: 3.9 mmol/L (ref 3.5–5.1)
SODIUM: 133 mmol/L — AB (ref 135–145)

## 2015-07-01 MED ORDER — LACTULOSE 10 GM/15ML PO SOLN: 30.0000 g | Freq: Every day | ORAL | Status: DC

## 2015-07-01 MED ORDER — METRONIDAZOLE 500 MG PO TABS
500.0000 mg | ORAL_TABLET | Freq: Three times a day (TID) | ORAL | Status: DC
  Administered 2015-07-01 – 2015-07-03 (×6): 500 mg via ORAL
  Filled 2015-07-01 (×6): qty 1

## 2015-07-01 MED ORDER — HYDROCODONE-ACETAMINOPHEN 5-325 MG PO TABS
1.0000 | ORAL_TABLET | Freq: Four times a day (QID) | ORAL | Status: DC | PRN
  Administered 2015-07-01 – 2015-07-03 (×6): 1 via ORAL
  Filled 2015-07-01 (×6): qty 1

## 2015-07-01 MED ORDER — METRONIDAZOLE 500 MG PO TABS: 500.0000 mg | ORAL_TABLET | Freq: Three times a day (TID) | ORAL | Status: DC

## 2015-07-01 MED ORDER — CIPROFLOXACIN HCL 500 MG PO TABS
500.0000 mg | ORAL_TABLET | Freq: Two times a day (BID) | ORAL | Status: DC
  Administered 2015-07-01 – 2015-07-03 (×5): 500 mg via ORAL
  Filled 2015-07-01 (×5): qty 1

## 2015-07-01 MED ORDER — HYDROCODONE-ACETAMINOPHEN 5-325 MG PO TABS: 1.0000 | ORAL_TABLET | Freq: Four times a day (QID) | ORAL | Status: DC | PRN

## 2015-07-01 MED ORDER — KCL IN DEXTROSE-NACL 20-5-0.45 MEQ/L-%-% IV SOLN
INTRAVENOUS | Status: DC
  Administered 2015-07-01: 13:00:00 via INTRAVENOUS
  Filled 2015-07-01: qty 1000

## 2015-07-01 MED ORDER — LACTULOSE 10 GM/15ML PO SOLN
20.0000 g | Freq: Every day | ORAL | Status: DC
  Administered 2015-07-01 – 2015-07-03 (×3): 20 g via ORAL
  Filled 2015-07-01 (×3): qty 30

## 2015-07-01 MED ORDER — CIPROFLOXACIN HCL 500 MG PO TABS
500.0000 mg | ORAL_TABLET | Freq: Two times a day (BID) | ORAL | Status: DC
Start: 2015-07-01 — End: 2015-07-27

## 2015-07-01 NOTE — Plan of Care (Signed)
Problem: Safety: Goal: Ability to remain free from injury will improve Outcome: Progressing PT has been working with therapy.

## 2015-07-01 NOTE — Progress Notes (Signed)
Physical Therapy Evaluation Patient Details Name: David Morgan MRN: [id number] DOB: 01-16-1925 Today's Date: 07/01/2015   History of Present Illness  Patient is an 81 y.o. male admitted on 10 Nov. for diverticulitis. Patient reports experiencing about 3 weeks of LLQ pain. Notes hx of bladder CA s/p resection and umbilical hernia repair x2.   Clinical Impression  Patient is a reportedly previously modified independent male admitted after experiencing worsening LLQ pain. Patient demonstrates good strength and sitting balance on evaluation, but he does demonstrate impairments in dynamic standing balance as well as gait. Patient has limited cardiopulmonary endurance but has tendency to move quickly, showing decreased safety awareness/awareness of deficits. Patient has assistive equipment and help available to d/c home safely; however, it is believed that he will benefit from continued PT services to improve balance, stamina, and function to prevent falls in the future.    Follow Up Recommendations Home health PT    Equipment Recommendations  None recommended by PT    Recommendations for Other Services       Precautions / Restrictions Precautions Precautions: Fall Restrictions Weight Bearing Restrictions: No      Mobility  Bed Mobility Overal bed mobility: Needs Assistance Bed Mobility: Supine to Sit;Sit to Supine     Supine to sit: HOB elevated;Min guard (Patient sleeps in recliner at home) Sit to supine: HOB elevated;Min guard   General bed mobility comments: Patient able to perform bed mobility with contact guard assistance. Can bridge/scoot with mild pain in abdominal region when performing forward trunk flexion. Patient requires HOB elevated as he sleeps in recliner at home.;  Transfers Overall transfer level: Needs assistance Equipment used: 4-wheeled walker Transfers: Sit to/from Stand Sit to Stand: Min guard         General transfer comment: Patient slightly  unsteady upon standing. Has tendency to push rollator too far in front of him and forgets to lock/unlock brakes.  Ambulation/Gait Ambulation/Gait assistance: Min guard Ambulation Distance (Feet): 190 Feet Assistive device: 4-wheeled walker Gait Pattern/deviations: Wide base of support     General Gait Details: Patient ambulates at quick cadence with wide BOS and R foot toed out. Patient required verbal/tactile cues to steady rollator, as he had tendency to push too far in front of him. Mild SOB following gait assessment. Recovered <2 mins.  Stairs            Wheelchair Mobility    Modified Rankin (Stroke Patients Only)       Balance Overall balance assessment: Needs assistance Sitting-balance support: Feet supported Sitting balance-Leahy Scale: Good Sitting balance - Comments: Posterior lean due to abdominal pain   Standing balance support: Bilateral upper extremity supported Standing balance-Leahy Scale: Fair Standing balance comment: Wide BOS                             Pertinent Vitals/Pain Pain Assessment: No/denies pain    Home Living Family/patient expects to be discharged to:: Private residence Living Arrangements: Spouse/significant other Available Help at Discharge: Family;Available PRN/intermittently Type of Home: House Home Access: Ramped entrance     Home Layout: One level Home Equipment: [place] - 4 wheels;Shower seat;Grab bars - toilet;Grab bars - tub/shower;Toilet riser;Walker - 2 wheels Additional Comments: RW at home; Rollator in community    Prior Function Level of Independence: Independent with assistive device(s)               Hand Dominance  Extremity/Trunk Assessment   Upper Extremity Assessment: Overall WFL for tasks assessed           Lower Extremity Assessment: Overall WFL for tasks assessed      Cervical / Trunk Assessment: Other exceptions (Pain with trunk flexion)  Communication    Communication: No difficulties  Cognition Arousal/Alertness: Awake/alert Behavior During Therapy: WFL for tasks assessed/performed Overall Cognitive Status: Within Functional Limits for tasks assessed                      General Comments      Exercises        Assessment/Plan    PT Assessment Patient needs continued PT services  PT Diagnosis Difficulty walking   PT Problem List Decreased activity tolerance;Decreased balance;Decreased mobility;Decreased knowledge of use of DME;Decreased safety awareness;Cardiopulmonary status limiting activity  PT Treatment Interventions Gait training;Functional mobility training;Therapeutic activities;Therapeutic exercise;Balance training;Patient/family education   PT Goals (Current goals can be found in the Care Plan section) Acute Rehab PT Goals Patient Stated Goal: "To get out of this bed." PT Goal Formulation: With patient/family Time For Goal Achievement: 07/15/15 Potential to Achieve Goals: Good    Frequency Min 2X/week   Barriers to discharge        Co-evaluation               End of Session Equipment Utilized During Treatment: Gait belt Activity Tolerance: Patient tolerated treatment well Patient left: in bed;with call bell/phone within reach;with bed alarm set Nurse Communication: Mobility status         Time: 1240-1306 PT Time Calculation (min) (ACUTE ONLY): 26 min   Charges:   PT Evaluation $Initial PT Evaluation Tier I: 1 Procedure     PT G Codes:        Dorice Lamas, PT, DPT 07/01/2015, 2:02 PM

## 2015-07-01 NOTE — Progress Notes (Signed)
Surgery Progress Note  S: Min pain.  No nausea.  + flatus, JP serosang O:Blood pressure 162/84, pulse 72, temperature 98.1 F (36.7 C), temperature source Oral, resp. rate 14, height 5\' 9"  (1.753 m), weight 214 lb 4.8 oz (97.206 kg), SpO2 97 %. GEN: NAD/A&Ox3 ABD: soft, nontender, nondistended, JP with serosang  A/P 81 yo s/p dx lap with abscess drainage, doing well - labs - possible PO abx if labs improved

## 2015-07-01 NOTE — Discharge Instructions (Signed)
Do not drive on pain medications Do not lift greater than 15 lbs for a period of 6 weeks Call or return to ER if you develop fever greater than 101.5, nausea/vomiting, increased pain, redness/drainage from incisions Okay to shower. Empty and record drain output and bring with you to clinic

## 2015-07-02 MED ORDER — KETOROLAC TROMETHAMINE 30 MG/ML IJ SOLN
15.0000 mg | Freq: Three times a day (TID) | INTRAMUSCULAR | Status: DC
  Administered 2015-07-02 – 2015-07-03 (×2): 15 mg via INTRAVENOUS
  Filled 2015-07-02 (×3): qty 1

## 2015-07-02 NOTE — Progress Notes (Signed)
Physical Therapy Treatment Patient Details Name: David Morgan MRN: [id number] DOB: March 15, 1925 Today's Date: 07/02/2015    History of Present Illness Patient is an 81 y.o. male admitted on 10 Nov. for diverticulitis. Patient reports experiencing about 3 weeks of LLQ pain. Notes hx of bladder CA s/p resection and umbilical hernia repair x2.     PT Comments    Pt with good tolerance to session today, he continues to demonstrate decreased safety awareness with transfers and use of rollator, however was able to correct with vc's.  Pt ambulated 12ft with rollator with cues d/t decreased control and cues to keep rollator closer for safety.  He would con't to benefit from skilled PT to increase safety with functional mobility.       Follow Up Recommendations  Home health PT     Equipment Recommendations  None recommended by PT    Recommendations for Other Services       Precautions / Restrictions Precautions Precautions: Fall Restrictions Weight Bearing Restrictions: No    Mobility  Bed Mobility               General bed mobility comments: Pt presented in chair  Transfers Overall transfer level: Needs assistance Equipment used: 4-wheeled walker Transfers: Sit to/from Stand Sit to Stand: Min guard         General transfer comment: Pt required min cues for proper safety sit to stand with rollator, forgot to remove breaks before ambulation  Ambulation/Gait Ambulation/Gait assistance: Min guard Ambulation Distance (Feet): 180 Feet Assistive device: 4-wheeled walker       General Gait Details: Pt required cues for pacing and maintaining steady walker requiring keeping walker closer to pt.     Stairs            Wheelchair Mobility    Modified Rankin (Stroke Patients Only)       Balance Overall balance assessment: Needs assistance Sitting-balance support: Feet supported Sitting balance-Leahy Scale: Good Sitting balance - Comments: reclined in  chair     Standing balance-Leahy Scale: Fair Standing balance comment: wide BOS                    Cognition Arousal/Alertness: Awake/alert Behavior During Therapy: WFL for tasks assessed/performed Overall Cognitive Status: Within Functional Limits for tasks assessed                      Exercises      General Comments        Pertinent Vitals/Pain Pain Assessment: 0-10 Pain Score: 6  Pain Location: LLQ Pain Intervention(s): Patient requesting pain meds-RN notified    Home Living                      Prior Function            PT Goals (current goals can now be found in the care plan section) Acute Rehab PT Goals Patient Stated Goal: I'm ready to take a walk PT Goal Formulation: With patient/family Time For Goal Achievement: 07/15/15 Potential to Achieve Goals: Good Progress towards PT goals: Progressing toward goals    Frequency  Min 2X/week    PT Plan      Co-evaluation             End of Session Equipment Utilized During Treatment: Gait belt Activity Tolerance: Patient tolerated treatment well Patient left: in chair;with family/visitor present     Time: [id number] PT Time Calculation (min) (ACUTE  ONLY): 23 min  Charges:  $Gait Training: 8-22 mins $Therapeutic Activity: 8-22 mins                    G Codes:      Cassundra Mckeever 07-09-2015, 12:32 PM  Karinne Schmader, PTA

## 2015-07-02 NOTE — Care Management (Signed)
PT recommends [facility]. offered choice and patient and spouse agree on [facility]. Patient spouse stated that [facility] and were very happy with them. Refreral placed with Floydene Flock at [facility]. Anticipate discharge soon.

## 2015-07-02 NOTE — Progress Notes (Signed)
Patient ID: David Morgan, male   DOB: 10-29-1924, 81 y.o.   MRN: [id number]   Surgery  POD 3  S/P laparoscopic drainage of pelvic abscess related to diverticulitis.  The patient is complaining of abdominal pain. He is tolerating a regular diet and is on oral antibiotics. He has had a bowel movement.  Filed Vitals:   07/01/15 0803 07/01/15 2008 07/02/15 0602 07/02/15 1255  BP: 165/65 156/66 164/70 130/55  Pulse: 78 74 72 74  Temp:  97.8 F (36.6 C) 98.1 F (36.7 C) 97.9 F (36.6 C)  TempSrc:  Oral Oral Oral  Resp:  16    Height:      Weight:      SpO2: 96% 96% 97% 98%    PE: The patient is in no distress. Lungs are clear. Abdomen is soft. Drains are serosanguineous in nature.  Labs  CBC Latest Ref Rng 07/01/2015 06/30/2015 06/29/2015  WBC 3.8 - 10.6 K/uL 9.8 10.3 16.9(H)  Hemoglobin 13.0 - 18.0 g/dL 14.0 14.0 13.4  Hematocrit 40.0 - 52.0 % 43.4 41.8 41.4  Platelets 150 - 440 K/uL 230 239 284   CMP Latest Ref Rng 07/01/2015 06/30/2015 06/29/2015  Glucose 65 - 99 mg/dL 126(H) 128(H) 139(H)  BUN 6 - 20 mg/dL 14 17 24(H)  Creatinine 0.61 - 1.24 mg/dL 0.73 0.77 0.88  Sodium 135 - 145 mmol/L 133(L) 138 134(L)  Potassium 3.5 - 5.1 mmol/L 3.9 3.8 4.4  Chloride 101 - 111 mmol/L 104 105 101  CO2 22 - 32 mmol/L 24 27 26   Calcium 8.9 - 10.3 mg/dL 8.1(L) 8.0(L) 8.1(L)  Total Protein 6.5 - 8.1 g/dL - - 6.0(L)  Total Bilirubin 0.3 - 1.2 mg/dL - - 1.0  Alkaline Phos 38 - 126 U/L - - 58  AST 15 - 41 U/L - - 21  ALT 17 - 63 U/L - - 35    I/O last 3 completed shifts: In: 2137 [P.O.:480; I.V.:1557; IV Piggyback:100] Out: 2285 [Urine:2250; Drains:35] Total I/O In: [id number] [P.O.:240; I.V.:107] Out: 300 [Urine:300]    IMP:  Overall doing well. We will try some Toradol for his pain. I anticipate discharge tomorrow.  Plan as above.

## 2015-07-02 NOTE — Care Management Important Message (Signed)
Important Message  Patient Details  Name: David Morgan MRN: [id number] Date of Birth: 05-19-1925   Medicare Important Message Given:  Yes    Alvie Heidelberg, RN 07/02/2015, 10:23 AM

## 2015-07-03 LAB — BASIC METABOLIC PANEL
ANION GAP: 5 (ref 5–15)
BUN: 15 mg/dL (ref 6–20)
CHLORIDE: 105 mmol/L (ref 101–111)
CO2: 24 mmol/L (ref 22–32)
Calcium: 7.9 mg/dL — ABNORMAL LOW (ref 8.9–10.3)
Creatinine, Ser: 0.72 mg/dL (ref 0.61–1.24)
GFR calc Af Amer: >60 mL/min (ref >60)
GLUCOSE: 113 mg/dL — AB (ref 65–99)
POTASSIUM: 4.1 mmol/L (ref 3.5–5.1)
Sodium: 134 mmol/L — ABNORMAL LOW (ref 135–145)

## 2015-07-03 LAB — CBC
HEMATOCRIT: 38.4 % — AB (ref 40.0–52.0)
HEMOGLOBIN: 12.7 g/dL — AB (ref 13.0–18.0)
MCH: 29 pg (ref 26.0–34.0)
MCHC: 33.2 g/dL (ref 32.0–36.0)
MCV: 87.3 fL (ref 80.0–100.0)
Platelets: 266 10*3/uL (ref 150–440)
RBC: 4.4 MIL/uL (ref 4.40–5.90)
RDW: 13.9 % (ref 11.5–14.5)
WBC: 11.5 10*3/uL — AB (ref 3.8–10.6)

## 2015-07-03 LAB — CULTURE, BLOOD (ROUTINE X 2)
CULTURE: NO GROWTH
CULTURE: NO GROWTH

## 2015-07-03 NOTE — Discharge Summary (Signed)
Physician Discharge Summary  Patient ID: David Morgan MRN: [id number] DOB/AGE: 1925-01-29 81 y.o.  Admit date: 06/28/2015 Discharge date: 07/03/2015  Admission Diagnoses: Perforated diverticular disease with pelvic abscess  Discharge Diagnoses:  Active Problems:   Diverticulitis  history of bladder cancer Abdominal wall hernia  Discharged Condition: Stable and improved  Hospital Course: The patient was admitted with large pelvic abscess. This was deemed secondary to diverticulitis. This was not amenable to an interventional radiological drainage. As such the patient was taken to the operating room by Dr. Adonis Huguenin for a laparoscopic drainage of the pelvic abscess on 06/29/2015.  Drains were placed. Postoperatively he had very very quick recovery. His diet was advanced. The patient had 2 bowel movements on the day of his discharge. He was transferred over to oral antibiotics consisting of Cipro and Flagyl.  He remained afebrile with adequate pain control. White count on the 15th was 11.5. Hemoglobin 12.7. Electrolytes are unremarkable.  Consults: Case management  Significant Diagnostic Studies: CT scan of the abdomen and pelvis  Treatments: See hospital course summary  Discharge Exam: Blood pressure 172/80, pulse 81, temperature 98.2 F (36.8 C), temperature source Oral, resp. rate 18, height 5\' 9"  (1.753 m), weight 214 lb 4.8 oz (97.206 kg), SpO2 99 %. The patient's abdomen was soft and nontender. Ventral hernia persists. Jackson-Pratt drains were draining serosanguineous fluid.   Disposition: 01-Home or Self Care  Discharge Instructions    JP/Blake drain to bulb suction    Complete by:  As directed             Medication List    STOP taking these medications        levofloxacin 500 MG tablet  Commonly known as:  LEVAQUIN     predniSONE 10 MG tablet  Commonly known as:  DELTASONE      TAKE these medications        amLODipine 2.5 MG tablet  Commonly known as:   NORVASC  Take 2.5 mg by mouth every morning.     aspirin EC 325 MG tablet  Take 325 mg by mouth daily.     atorvastatin 40 MG tablet  Commonly known as:  LIPITOR  Take 40 mg by mouth every evening.     carvedilol 3.125 MG tablet  Commonly known as:  COREG  Take 3.125 mg by mouth 2 (two) times daily with a meal.     ciprofloxacin 500 MG tablet  Commonly known as:  CIPRO  Take 1 tablet (500 mg total) by mouth 2 (two) times daily.     gabapentin 100 MG capsule  Commonly known as:  NEURONTIN  Take 100 mg by mouth 3 (three) times daily.     HYDROcodone-acetaminophen 5-325 MG tablet  Commonly known as:  NORCO/VICODIN  Take 1 tablet by mouth every 6 (six) hours as needed for moderate pain.     levothyroxine 150 MCG tablet  Commonly known as:  SYNTHROID, LEVOTHROID  Take 150 mcg by mouth every evening.     losartan 100 MG tablet  Commonly known as:  COZAAR  Take 100 mg by mouth every morning.     meloxicam 15 MG tablet  Commonly known as:  MOBIC  Take 15 mg by mouth daily.     metroNIDAZOLE 500 MG tablet  Commonly known as:  FLAGYL  Take 1 tablet (500 mg total) by mouth every 8 (eight) hours.     multivitamin tablet  Take 1 tablet by mouth daily.  tamsulosin 0.4 MG Caps capsule  Commonly known as:  FLOMAX  Take 0.8 mg by mouth every evening.           Follow-up Information    Follow up with [facility]. Schedule an appointment as soon as possible for a visit in 1 week.   Why:  For suture removal and JP removal.      Signed: Sherri Rad 07/03/2015, 8:12 AM

## 2015-07-03 NOTE — Progress Notes (Signed)
Alert and oriented. VSS. No signs of acute distress. Discharge instructions given. Patient verbalized understanding. 

## 2015-07-04 ENCOUNTER — Telehealth: Payer: Self-pay | Admitting: Surgery

## 2015-07-04 NOTE — Telephone Encounter (Signed)
Returned patient's wife's call. Patient's wife [name] called with concerns about the color of patient's drainage. [name] reported that the patient does not have a fever, is not in pain, no smell to drainage, and the amount of drainage has decreased to about 5 cc daily.. Informed [name] that the drainage color is normal during the part of the healing process.  I directed [name] to seek immediate medical attention for the patient if he develops a fever, drainage volume increases, or if he develops severe pain. [name] confirmed understanding of direction and information.

## 2015-07-04 NOTE — Telephone Encounter (Signed)
Patients wife,[name], called to say Right side drainage is light red while the left side drainage is the color of chocolate milk and she is a little concerned about that. That is where the soreness is. She said no fever. She said surgery was the 10th.

## 2015-07-05 ENCOUNTER — Telehealth: Payer: Self-pay

## 2015-07-05 NOTE — Telephone Encounter (Signed)
Called patient to see how he is doing since yesterday. Spoke with patient's wife, she feels that patient is doing much better since call yesterday. Denies fever and also states that home health nurse will be coming in approximately 30 minutes to check on patient. Encouraged to call back with any other questions or concerns. Informed that if patient develops a fever > 100.5 then she needs to call office immediately. She verbalizes understanding of this.  Confirmed patient's follow-up appointment with patient's wife.

## 2015-07-10 ENCOUNTER — Encounter: Payer: Self-pay | Admitting: *Deleted

## 2015-07-10 ENCOUNTER — Other Ambulatory Visit: Payer: Self-pay | Admitting: *Deleted

## 2015-07-10 DIAGNOSIS — E785 Hyperlipidemia, unspecified: Secondary | ICD-10-CM | POA: Insufficient documentation

## 2015-07-10 DIAGNOSIS — I10 Essential (primary) hypertension: Secondary | ICD-10-CM | POA: Insufficient documentation

## 2015-07-10 DIAGNOSIS — E079 Disorder of thyroid, unspecified: Secondary | ICD-10-CM | POA: Insufficient documentation

## 2015-07-11 ENCOUNTER — Ambulatory Visit (INDEPENDENT_AMBULATORY_CARE_PROVIDER_SITE_OTHER): Payer: Commercial Managed Care - HMO | Admitting: Surgery

## 2015-07-11 ENCOUNTER — Encounter: Payer: Self-pay | Admitting: Surgery

## 2015-07-11 ENCOUNTER — Other Ambulatory Visit: Payer: Self-pay | Admitting: *Deleted

## 2015-07-11 VITALS — BP 139/66 | HR 95 | Temp 98.6°F | Resp 26 | Ht 69.0 in | Wt 220.0 lb

## 2015-07-11 DIAGNOSIS — K5792 Diverticulitis of intestine, part unspecified, without perforation or abscess without bleeding: Secondary | ICD-10-CM

## 2015-07-11 MED ORDER — METRONIDAZOLE 500 MG PO TABS: 500.0000 mg | ORAL_TABLET | Freq: Three times a day (TID) | ORAL | Status: DC

## 2015-07-11 MED ORDER — CIPROFLOXACIN HCL 500 MG PO TABS: 500.0000 mg | ORAL_TABLET | Freq: Two times a day (BID) | ORAL | Status: DC

## 2015-07-11 NOTE — Patient Instructions (Signed)
We have given you prescriptions today for antibiotics and pain medications.  Please call our office with any questions or concerns and ask to speak with a nurse.  We will see you back in the office in about 10 days.

## 2015-07-11 NOTE — Progress Notes (Signed)
Outpatient postop visit  07/11/2015  David Morgan is an 81 y.o. male.    Procedure:  Laparoscopic adhesiolysis an drainage of pelvic abscesses  CC: right-sided drain fell out  HPI:  This patient underwent a laparoscopic adhesiolyse cysts and placement of drains for acute diverticulitis by Dr. Adonis Huguenin. He has minimal drainage from the remaining left drain the right one fell out spontaneously. He brought that drain in and it was intact according to the RN.  Medications reviewed.    Physical Exam:  BP 139/66 mmHg  Pulse 95  Temp(Src) 98.6 F (37 C) (Oral)  Resp 26  Ht 5\' 9"  (1.753 m)  Wt 220 lb (99.791 kg)  BMI 32.47 kg/m2  SpO2 96%    PE:  No distress soft nontender abdomen protuberant however. Remaining drain is removed.    Assessment/Plan:   acute diverticulitis with drainage. Drains a been removed. Recommend continuing Cipro Flagyl and I will refill those at this point he will see Dr. Adonis Huguenin next week  Florene Glen, MD, FACS

## 2015-07-27 ENCOUNTER — Encounter: Payer: Self-pay | Admitting: General Surgery

## 2015-07-27 ENCOUNTER — Ambulatory Visit (INDEPENDENT_AMBULATORY_CARE_PROVIDER_SITE_OTHER): Payer: Commercial Managed Care - HMO | Admitting: General Surgery

## 2015-07-27 VITALS — BP 205/83 | HR 87 | Temp 98.7°F | Ht 69.0 in | Wt 215.0 lb

## 2015-07-27 DIAGNOSIS — K5720 Diverticulitis of large intestine with perforation and abscess without bleeding: Secondary | ICD-10-CM

## 2015-07-27 NOTE — Patient Instructions (Signed)
You will need to have a Colonoscopy in approximately 1 month. If you continue to have nausea, we will have the doctor do a Endoscopy at the same time.  We will have a triage nurse call your home and get this set-up for you.   Please call our office with any questions or concerns.

## 2015-07-27 NOTE — Progress Notes (Signed)
Outpatient Surgical Follow Up  07/27/2015  David Morgan is an 81 y.o. male.   Chief Complaint  Patient presents with  . Routine Post Op    HPI: A 81 year old male returns to clinic for follow-up 1 month status post diagnostic laparoscopy with drainage of pelvic abscess. Glasses drains were removed at his previous visit. He denies any fevers, chills, abdominal pain, chest pain, shortness of breath, constipation. He has been having still some loose stools and still some nausea. However he denies any emesis and states that both this is improving. His stools are becoming more and more normal every day. He has completed his antibiotics and otherwise returning to his usual state of health.  Past Medical History  Diagnosis Date  . Lesion of bladder   . History of bladder cancer   . Hypertension   . Hyperlipidemia   . Peripheral vascular disease ([place])   . Claudication ([place])   . Emphysema/COPD ([place])   . Mild asthma     NO INHALER  . Short of breath on exertion   . Hypothyroidism   . Urgency of urination   . Nocturia   . Arthritis   . Wears glasses   . History of TIA (transient ischemic attack)     2012--  NO RESIDUAL (PER SCAN HAD A PREVIOUS TIA BEFORE 2012)  . History of carotid artery stenosis   . Cancer ([place])   . Diverticulitis     Past Surgical History  Procedure Laterality Date  . Transurethral resection of bladder tumor  1990  . Total knee arthroplasty Right 2004  . Carotid endarterectomy Right 2005  . Carpal tunnel release Bilateral 2002  &  2007  . Right shoulder  surgery  2005  . Orif hip fracture Left 2008    RETAINED HARDWARE  . Umbilical hernia repair   2009  &  2011  . Inguinal hernia repair  YRS AGO  . Cataract extraction w/ intraocular lens  implant, bilateral    . Cystoscopy with biopsy N/A 12/26/2013    Procedure: CYSTOSCOPY WITH BLADDER BIOPSY;  Surgeon: Claybon Jabs, MD;  Location: [facility];  Service: Urology;  Laterality: N/A;  .  Cystoscopy w/ retrogrades Bilateral 12/26/2013    Procedure: BILATERAL RETROGRADE PYELOGRAM;  Surgeon: Claybon Jabs, MD;  Location: [facility];  Service: Urology;  Laterality: Bilateral;  . Laparoscopy  06/28/2015    Procedure: LAPAROSCOPY DIAGNOSTIC;  Surgeon: Clayburn Pert, MD;  Location: [facility];  Service: General;;  . Laparoscopic lysis of adhesions  06/28/2015    Procedure: LAPAROSCOPIC LYSIS OF ADHESIONS;  Surgeon: Clayburn Pert, MD;  Location: [facility];  Service: General;;    No family history on file.  Social History:  reports that he quit smoking about 21 years ago. His smoking use included Cigarettes. He has a 40 pack-year smoking history. He has never used smokeless tobacco. He reports that he drinks alcohol. He reports that he does not use illicit drugs.  Allergies:  Allergies  Allergen Reactions  . Adhesive [Tape] Rash  . Augmentin [Amoxicillin-Pot Clavulanate] Itching and Rash    Medications reviewed.    ROS A multipoint review of systems was completed. All pertinent positives negatives within the history of present illness the remainder were negative.   BP 205/83 mmHg  Pulse 87  Temp(Src) 98.7 F (37.1 C) (Oral)  Ht 5\' 9"  (1.753 m)  Wt 97.523 kg (215 lb)  BMI 31.74 kg/m2  Physical Exam  No results found for this or any previous visit (from the past 48 hour(s)). No results found.  Assessment/Plan:  1. Diverticulitis of large intestine with perforation and abscess without bleeding Again discussed with patient and his wife given the large abscess that was drained laparoscopically that it is possible for this to happen again. Discussed that the next step in his treatment involves a colonoscopy. Plan would be to perform a colonoscopy after the first year to confirm that this was just from diverticulitis and not from something more nefarious such as a perforated colon cancer. Patient wife voiced understanding. We'll plan for colonoscopy  after the first year.     Clayburn Pert, MD FACS General Surgeon  07/27/2015,12:27 PM

## 2015-08-06 ENCOUNTER — Inpatient Hospital Stay
Admission: EM | Admit: 2015-08-06 | Discharge: 2015-08-10 | DRG: 392 | Disposition: A | Discharge status: Home Health | Payer: Commercial Managed Care - HMO | Attending: Surgery | Admitting: Surgery

## 2015-08-06 ENCOUNTER — Emergency Department: Payer: Commercial Managed Care - HMO

## 2015-08-06 ENCOUNTER — Telehealth: Payer: Self-pay | Admitting: General Surgery

## 2015-08-06 ENCOUNTER — Encounter: Payer: Self-pay | Admitting: *Deleted

## 2015-08-06 ENCOUNTER — Other Ambulatory Visit: Payer: Self-pay

## 2015-08-06 DIAGNOSIS — I10 Essential (primary) hypertension: Secondary | ICD-10-CM | POA: Diagnosis present

## 2015-08-06 DIAGNOSIS — Z7982 Long term (current) use of aspirin: Secondary | ICD-10-CM

## 2015-08-06 DIAGNOSIS — I739 Peripheral vascular disease, unspecified: Secondary | ICD-10-CM | POA: Diagnosis present

## 2015-08-06 DIAGNOSIS — Z8551 Personal history of malignant neoplasm of bladder: Secondary | ICD-10-CM

## 2015-08-06 DIAGNOSIS — K5780 Diverticulitis of intestine, part unspecified, with perforation and abscess without bleeding: Secondary | ICD-10-CM | POA: Insufficient documentation

## 2015-08-06 DIAGNOSIS — J449 Chronic obstructive pulmonary disease, unspecified: Secondary | ICD-10-CM | POA: Diagnosis present

## 2015-08-06 DIAGNOSIS — Z8673 Personal history of transient ischemic attack (TIA), and cerebral infarction without residual deficits: Secondary | ICD-10-CM

## 2015-08-06 DIAGNOSIS — Z79899 Other long term (current) drug therapy: Secondary | ICD-10-CM | POA: Diagnosis not present

## 2015-08-06 DIAGNOSIS — Z880 Allergy status to penicillin: Secondary | ICD-10-CM | POA: Diagnosis not present

## 2015-08-06 DIAGNOSIS — Z87891 Personal history of nicotine dependence: Secondary | ICD-10-CM

## 2015-08-06 DIAGNOSIS — E785 Hyperlipidemia, unspecified: Secondary | ICD-10-CM | POA: Diagnosis present

## 2015-08-06 DIAGNOSIS — K5720 Diverticulitis of large intestine with perforation and abscess without bleeding: Principal | ICD-10-CM | POA: Diagnosis present

## 2015-08-06 DIAGNOSIS — K5732 Diverticulitis of large intestine without perforation or abscess without bleeding: Secondary | ICD-10-CM | POA: Diagnosis present

## 2015-08-06 DIAGNOSIS — E039 Hypothyroidism, unspecified: Secondary | ICD-10-CM | POA: Diagnosis present

## 2015-08-06 LAB — COMPREHENSIVE METABOLIC PANEL
ALK PHOS: 54 U/L (ref 38–126)
ALT: 20 U/L (ref 17–63)
AST: 23 U/L (ref 15–41)
Albumin: 3.5 g/dL (ref 3.5–5.0)
Anion gap: 8 (ref 5–15)
BUN: 17 mg/dL (ref 6–20)
CALCIUM: 8.8 mg/dL — AB (ref 8.9–10.3)
CO2: 28 mmol/L (ref 22–32)
CREATININE: 0.69 mg/dL (ref 0.61–1.24)
Chloride: 99 mmol/L — ABNORMAL LOW (ref 101–111)
Glucose, Bld: 126 mg/dL — ABNORMAL HIGH (ref 65–99)
Potassium: 3.4 mmol/L — ABNORMAL LOW (ref 3.5–5.1)
Sodium: 135 mmol/L (ref 135–145)
Total Bilirubin: 1.4 mg/dL — ABNORMAL HIGH (ref 0.3–1.2)
Total Protein: 8.3 g/dL — ABNORMAL HIGH (ref 6.5–8.1)

## 2015-08-06 LAB — URINALYSIS COMPLETE WITH MICROSCOPIC (ARMC ONLY)
BILIRUBIN URINE: NEGATIVE
Bacteria, UA: NONE SEEN
Glucose, UA: NEGATIVE mg/dL
Hgb urine dipstick: NEGATIVE
Leukocytes, UA: NEGATIVE
Nitrite: NEGATIVE
PROTEIN: NEGATIVE mg/dL
Squamous Epithelial / LPF: NONE SEEN
pH: 8 (ref 5.0–8.0)

## 2015-08-06 LAB — LACTIC ACID, PLASMA: Lactic Acid, Venous: 1.8 mmol/L (ref 0.5–2.0)

## 2015-08-06 LAB — CBC
HCT: 40.9 % (ref 40.0–52.0)
Hemoglobin: 13.3 g/dL (ref 13.0–18.0)
MCH: 28.3 pg (ref 26.0–34.0)
MCHC: 32.6 g/dL (ref 32.0–36.0)
MCV: 86.8 fL (ref 80.0–100.0)
PLATELETS: 185 10*3/uL (ref 150–440)
RBC: 4.71 MIL/uL (ref 4.40–5.90)
RDW: 15.3 % — ABNORMAL HIGH (ref 11.5–14.5)
WBC: 10.8 10*3/uL — AB (ref 3.8–10.6)

## 2015-08-06 LAB — APTT: APTT: 36 s (ref 24–36)

## 2015-08-06 LAB — TYPE AND SCREEN
ABO/RH(D): B POS
Antibody Screen: NEGATIVE

## 2015-08-06 LAB — PROTIME-INR
INR: 1.13
Prothrombin Time: 14.7 seconds (ref 11.4–15.0)

## 2015-08-06 LAB — ABO/RH: ABO/RH(D): B POS

## 2015-08-06 LAB — LIPASE, BLOOD: Lipase: 25 U/L (ref 11–51)

## 2015-08-06 MED ORDER — LEVOTHYROXINE SODIUM 75 MCG PO TABS
150.0000 ug | ORAL_TABLET | Freq: Every evening | ORAL | Status: DC
  Administered 2015-08-07 – 2015-08-09 (×3): 150 ug via ORAL
  Filled 2015-08-06 (×3): qty 2

## 2015-08-06 MED ORDER — METRONIDAZOLE IN NACL 5-0.79 MG/ML-% IV SOLN
500.0000 mg | Freq: Once | INTRAVENOUS | Status: AC
  Administered 2015-08-06: 500 mg via INTRAVENOUS
  Filled 2015-08-06: qty 100

## 2015-08-06 MED ORDER — MORPHINE SULFATE (PF) 2 MG/ML IV SOLN
2.0000 mg | INTRAVENOUS | Status: DC | PRN
  Administered 2015-08-08: 2 mg via INTRAVENOUS
  Filled 2015-08-06: qty 1

## 2015-08-06 MED ORDER — ENOXAPARIN SODIUM 40 MG/0.4ML ~~LOC~~ SOLN
40.0000 mg | SUBCUTANEOUS | Status: DC
  Administered 2015-08-06 – 2015-08-09 (×4): 40 mg via SUBCUTANEOUS
  Filled 2015-08-06 (×6): qty 0.4

## 2015-08-06 MED ORDER — AMLODIPINE BESYLATE 5 MG PO TABS
2.5000 mg | ORAL_TABLET | Freq: Every morning | ORAL | Status: DC
  Administered 2015-08-07 – 2015-08-10 (×4): 2.5 mg via ORAL
  Filled 2015-08-06 (×5): qty 1

## 2015-08-06 MED ORDER — SODIUM CHLORIDE 0.9 % IV BOLUS (SEPSIS)
1000.0000 mL | Freq: Once | INTRAVENOUS | Status: AC
  Administered 2015-08-06: 1000 mL via INTRAVENOUS

## 2015-08-06 MED ORDER — CIPROFLOXACIN IN D5W 400 MG/200ML IV SOLN
400.0000 mg | Freq: Two times a day (BID) | INTRAVENOUS | Status: DC
  Administered 2015-08-07 – 2015-08-10 (×7): 400 mg via INTRAVENOUS
  Filled 2015-08-06 (×8): qty 200

## 2015-08-06 MED ORDER — IOHEXOL 300 MG/ML  SOLN
100.0000 mL | Freq: Once | INTRAMUSCULAR | Status: AC | PRN
  Administered 2015-08-06: 100 mL via INTRAVENOUS

## 2015-08-06 MED ORDER — LOSARTAN POTASSIUM 50 MG PO TABS
50.0000 mg | ORAL_TABLET | Freq: Every day | ORAL | Status: DC
  Administered 2015-08-06 – 2015-08-10 (×5): 50 mg via ORAL
  Filled 2015-08-06 (×7): qty 1

## 2015-08-06 MED ORDER — ACETAMINOPHEN 325 MG PO TABS
650.0000 mg | ORAL_TABLET | Freq: Four times a day (QID) | ORAL | Status: DC | PRN
  Administered 2015-08-07 – 2015-08-09 (×5): 650 mg via ORAL
  Filled 2015-08-06 (×5): qty 2

## 2015-08-06 MED ORDER — TAMSULOSIN HCL 0.4 MG PO CAPS
0.8000 mg | ORAL_CAPSULE | Freq: Every evening | ORAL | Status: DC
  Administered 2015-08-06 – 2015-08-09 (×4): 0.8 mg via ORAL
  Filled 2015-08-06 (×4): qty 2

## 2015-08-06 MED ORDER — KCL IN DEXTROSE-NACL 20-5-0.45 MEQ/L-%-% IV SOLN
INTRAVENOUS | Status: DC
  Administered 2015-08-06 – 2015-08-08 (×4): via INTRAVENOUS
  Filled 2015-08-06 (×11): qty 1000

## 2015-08-06 MED ORDER — METRONIDAZOLE IN NACL 5-0.79 MG/ML-% IV SOLN
500.0000 mg | Freq: Three times a day (TID) | INTRAVENOUS | Status: DC
  Administered 2015-08-07 – 2015-08-10 (×11): 500 mg via INTRAVENOUS
  Filled 2015-08-06 (×12): qty 100

## 2015-08-06 MED ORDER — ONDANSETRON HCL 4 MG PO TABS: 4.0000 mg | ORAL_TABLET | Freq: Four times a day (QID) | ORAL | Status: DC | PRN

## 2015-08-06 MED ORDER — CARVEDILOL 6.25 MG PO TABS
3.1250 mg | ORAL_TABLET | Freq: Two times a day (BID) | ORAL | Status: DC
  Administered 2015-08-07 – 2015-08-10 (×7): 3.125 mg via ORAL
  Filled 2015-08-06 (×7): qty 1

## 2015-08-06 MED ORDER — GABAPENTIN 100 MG PO CAPS
100.0000 mg | ORAL_CAPSULE | Freq: Three times a day (TID) | ORAL | Status: DC
  Administered 2015-08-06 – 2015-08-10 (×11): 100 mg via ORAL
  Filled 2015-08-06 (×11): qty 1

## 2015-08-06 MED ORDER — ACETAMINOPHEN 650 MG RE SUPP: 650.0000 mg | Freq: Four times a day (QID) | RECTAL | Status: DC | PRN

## 2015-08-06 MED ORDER — ATORVASTATIN CALCIUM 20 MG PO TABS
40.0000 mg | ORAL_TABLET | Freq: Every evening | ORAL | Status: DC
  Administered 2015-08-07 – 2015-08-09 (×3): 40 mg via ORAL
  Filled 2015-08-06 (×4): qty 2

## 2015-08-06 MED ORDER — ONDANSETRON HCL 4 MG/2ML IJ SOLN
4.0000 mg | Freq: Four times a day (QID) | INTRAMUSCULAR | Status: DC | PRN
  Filled 2015-08-06: qty 2

## 2015-08-06 MED ORDER — CIPROFLOXACIN IN D5W 400 MG/200ML IV SOLN
400.0000 mg | Freq: Once | INTRAVENOUS | Status: DC
  Filled 2015-08-06: qty 200

## 2015-08-06 NOTE — ED Provider Notes (Addendum)
[facility] Emergency Department Provider Note  ____________________________________________   I have reviewed the triage vital signs and the nursing notes.   HISTORY  Chief Complaint Abdominal Pain    HPI David Morgan is a 81 y.o. male who was seen in November with a history of trouble abdominal abscess and drains placedthought secondary to possible diverticular disease for the last 4 or 5 days, since Thursday, today is Monday, the patient has had vomiting whenever he eats something and left lower quadrant abdominal pain made worse with food. He has had minimal stooling since that time. He denies any fever. The vomiting was very foul smelling. Denies hematemesis or bright red blood per rectum.  Past Medical History  Diagnosis Date  . Lesion of bladder   . History of bladder cancer   . Hypertension   . Hyperlipidemia   . Peripheral vascular disease ([place])   . Claudication ([place])   . Emphysema/COPD ([place])   . Mild asthma     NO INHALER  . Short of breath on exertion   . Hypothyroidism   . Urgency of urination   . Nocturia   . Arthritis   . Wears glasses   . History of TIA (transient ischemic attack)     2012--  NO RESIDUAL (PER SCAN HAD A PREVIOUS TIA BEFORE 2012)  . History of carotid artery stenosis   . Cancer ([place])   . Diverticulitis     Patient Active Problem List   Diagnosis Date Noted  . Disease of thyroid gland 07/10/2015  . BP (high blood pressure) 07/10/2015  . HLD (hyperlipidemia) 07/10/2015  . Diverticulitis 06/28/2015  . Benign fibroma of prostate 08/02/2014    Past Surgical History  Procedure Laterality Date  . Transurethral resection of bladder tumor  1990  . Total knee arthroplasty Right 2004  . Carotid endarterectomy Right 2005  . Carpal tunnel release Bilateral 2002  &  2007  . Right shoulder  surgery  2005  . Orif hip fracture Left 2008    RETAINED HARDWARE  . Umbilical hernia repair   2009  &  2011  . Inguinal  hernia repair  YRS AGO  . Cataract extraction w/ intraocular lens  implant, bilateral    . Cystoscopy with biopsy N/A 12/26/2013    Procedure: CYSTOSCOPY WITH BLADDER BIOPSY;  Surgeon: Claybon Jabs, MD;  Location: [facility];  Service: Urology;  Laterality: N/A;  . Cystoscopy w/ retrogrades Bilateral 12/26/2013    Procedure: BILATERAL RETROGRADE PYELOGRAM;  Surgeon: Claybon Jabs, MD;  Location: [facility];  Service: Urology;  Laterality: Bilateral;  . Laparoscopy  06/28/2015    Procedure: LAPAROSCOPY DIAGNOSTIC;  Surgeon: Clayburn Pert, MD;  Location: [facility];  Service: General;;  . Laparoscopic lysis of adhesions  06/28/2015    Procedure: LAPAROSCOPIC LYSIS OF ADHESIONS;  Surgeon: Clayburn Pert, MD;  Location: [facility];  Service: General;;    Current Outpatient Rx  Name  Route  Sig  Dispense  Refill  . amLODipine (NORVASC) 2.5 MG tablet   Oral   Take 2.5 mg by mouth every morning.         [name] aspirin EC 325 MG tablet   Oral   Take 325 mg by mouth daily.         [name] atorvastatin (LIPITOR) 40 MG tablet   Oral   Take 40 mg by mouth every evening.         . carvedilol (COREG) 3.125 MG  tablet   Oral   Take 3.125 mg by mouth 2 (two) times daily with a meal.         . gabapentin (NEURONTIN) 100 MG capsule   Oral   Take 100 mg by mouth 3 (three) times daily.          [name] levothyroxine (SYNTHROID, LEVOTHROID) 150 MCG tablet   Oral   Take 150 mcg by mouth every evening.         [name] losartan (COZAAR) 50 MG tablet   Oral   Take 50 mg by mouth daily.          . meloxicam (MOBIC) 15 MG tablet   Oral   Take 15 mg by mouth daily.         . tamsulosin (FLOMAX) 0.4 MG CAPS capsule   Oral   Take 0.8 mg by mouth every evening.           Allergies Adhesive and Augmentin  No family history on file.  Social History Social History  Substance Use Topics  . Smoking status: Former Smoker -- 1.00 packs/day for 40 years    Types:  Cigarettes    Quit date: 12/21/1993  . Smokeless tobacco: Never Used  . Alcohol Use: Yes     Comment: RARE    Review of Systems Constitutional: No fever/chills Eyes: No visual changes. ENT: No sore throat. No stiff neck no neck pain Cardiovascular: Denies chest pain. Respiratory: Denies shortness of breath. Gastrointestinal:  See history of present illness Genitourinary: Negative for dysuria. Musculoskeletal: Negative lower extremity swelling Skin: Negative for rash. Neurological: Negative for headaches, focal weakness or numbness. 10-point ROS otherwise negative.  ____________________________________________   PHYSICAL EXAM:  VITAL SIGNS: ED Triage Vitals  Enc Vitals Group     BP 08/06/15 1350 163/74 mmHg     Pulse Rate 08/06/15 1350 95     Resp 08/06/15 1350 20     Temp 08/06/15 1350 98.3 F (36.8 C)     Temp Source 08/06/15 1350 Oral     SpO2 08/06/15 1350 97 %     Weight 08/06/15 1350 210 lb (95.255 kg)     Height 08/06/15 1350 5\' 10"  (1.778 m)     Head Cir --      Peak Flow --      Pain Score 08/06/15 1350 5     Pain Loc --      Pain Edu? --      Excl. in [place]? --     Constitutional: Alert and oriented. Well appearing and in no acute distress. Eyes: Conjunctivae are normal. PERRL. EOMI. Head: Atraumatic. Nose: No congestion/rhinnorhea. Mouth/Throat: Mucous membranes are moist.  Oropharynx non-erythematous. Neck: No stridor.   Nontender with no meningismus Cardiovascular: Normal rate, regular rhythm. Grossly normal heart sounds.  Good peripheral circulation. Respiratory: Normal respiratory effort.  No retractions. Lungs CTAB. Abdominal: Soft and nontender. No distention. Significant tenderness to palpation left lower quadrant with voluntary and involuntary guarding but no rebound abdomen is otherwise soft with present bowel sounds Back:  There is no focal tenderness or step off there is no midline tenderness there are no lesions noted. there is no CVA  tenderness Musculoskeletal: No lower extremity tenderness. No joint effusions, no DVT signs strong distal pulses no edema Neurologic:  Normal speech and language. No gross focal neurologic deficits are appreciated.  Skin:  Skin is warm, dry and intact. No rash noted. Psychiatric: Mood and affect are normal. Speech and behavior are normal.  ____________________________________________   LABS (all labs ordered are listed, but only abnormal results are displayed)  Labs Reviewed  COMPREHENSIVE METABOLIC PANEL - Abnormal; Notable for the following:    Potassium 3.4 (*)    Chloride 99 (*)    Glucose, Bld 126 (*)    Calcium 8.8 (*)    Total Protein 8.3 (*)    Total Bilirubin 1.4 (*)    All other components within normal limits  CBC - Abnormal; Notable for the following:    WBC 10.8 (*)    RDW 15.3 (*)    All other components within normal limits  LIPASE, BLOOD  URINALYSIS COMPLETEWITH MICROSCOPIC ([facility] ONLY)  LACTIC ACID, PLASMA  LACTIC ACID, PLASMA  PROTIME-INR  APTT  TYPE AND SCREEN   ____________________________________________  EKG  I personally interpreted any EKGs ordered by me or triage Normal sinus rhythm rate 95 bpm no acute ST elevation or acute ST depression normal axis unremarkable EKG ____________________________________________  RADIOLOGY  I reviewed any imaging ordered by me or triage that were performed during my shift ____________________________________________   PROCEDURES  Procedure(s) performed: None  Critical Care performed: None  ____________________________________________   INITIAL IMPRESSION / ASSESSMENT AND PLAN / ED COURSE  Pertinent labs & imaging results that were available during my care of the patient were reviewed by me and considered in my medical decision making (see chart for details).  Concern for either abscess or diverticular pathology in the left lower quadrant. We will give the patient IV fluid, check a lactic acid, obtain  preop labs as a precaution, and obtain a CT scan  ----------------------------------------- 6:20 PM on 08/06/2015 -----------------------------------------  Dr. read was kind enough to take a consult and graciously agrees to come evaluate the patient ____________________________________________   FINAL CLINICAL IMPRESSION(S) / ED DIAGNOSES  Final diagnoses:  None     Schuyler Amor, MD 08/06/15 [place], MD 08/06/15 [place], MD 08/06/15 [phone]

## 2015-08-06 NOTE — Progress Notes (Signed)
David Morgan is an 81 y.o. male.     Chief Complaint: Abdominal pain and vomiting  HPI:   This is an 81 year old male with a history of high blood pressure and diverticulitis who in early November of this year presented with a complicated diverticular abscess not amenable to interventional drainage and underwent laparoscopic drainage of the abscess.  He was discharged home with drains in place following a course of intravenous antibiotics.  He completed outpatient oral antibiotics and did well up until Wednesday of last week when he began having left lower quadrant abdominal pain along with nausea and vomiting.     He called the office this morning seeking medical attention and was sent directly to the emergency room. While in the emergency room the patient's been given intravenous antibiotics following evaluation by the staff found have a white count which was 10.8.  Due to his history a CT scan of the abdomen and pelvis was performed and I personally reviewed. There is evidence of microperforation. No obvious free fluid or abscess is seen on the CT scan. Surgical services were asked to evaluate and manage.   Past Medical History  Diagnosis Date  . Lesion of bladder   . History of bladder cancer   . Hypertension   . Hyperlipidemia   . Peripheral vascular disease ([place])   . Claudication ([place])   . Emphysema/COPD ([place])   . Mild asthma     NO INHALER  . Short of breath on exertion   . Hypothyroidism   . Urgency of urination   . Nocturia   . Arthritis   . Wears glasses   . History of TIA (transient ischemic attack)     2012--  NO RESIDUAL (PER SCAN HAD A PREVIOUS TIA BEFORE 2012)  . History of carotid artery stenosis   . Cancer ([place])   . Diverticulitis     Past Surgical History  Procedure Laterality Date  . Transurethral resection of bladder tumor  1990  . Total knee arthroplasty Right 2004  . Carotid endarterectomy Right 2005  . Carpal tunnel release Bilateral 2002  &  2007    . Right shoulder  surgery  2005  . Orif hip fracture Left 2008    RETAINED HARDWARE  . Umbilical hernia repair   2009  &  2011  . Inguinal hernia repair  YRS AGO  . Cataract extraction w/ intraocular lens  implant, bilateral    . Cystoscopy with biopsy N/A 12/26/2013    Procedure: CYSTOSCOPY WITH BLADDER BIOPSY;  Surgeon: Claybon Jabs, MD;  Location: [facility];  Service: Urology;  Laterality: N/A;  . Cystoscopy w/ retrogrades Bilateral 12/26/2013    Procedure: BILATERAL RETROGRADE PYELOGRAM;  Surgeon: Claybon Jabs, MD;  Location: [facility];  Service: Urology;  Laterality: Bilateral;  . Laparoscopy  06/28/2015    Procedure: LAPAROSCOPY DIAGNOSTIC;  Surgeon: Clayburn Pert, MD;  Location: [facility];  Service: General;;  . Laparoscopic lysis of adhesions  06/28/2015    Procedure: LAPAROSCOPIC LYSIS OF ADHESIONS;  Surgeon: Clayburn Pert, MD;  Location: [facility];  Service: General;;    Social History:  reports that he quit smoking about 21 years ago. His smoking use included Cigarettes. He has a 40 pack-year smoking history. He has never used smokeless tobacco. He reports that he drinks alcohol. He reports that he does not use illicit drugs.   Allergies:  Allergies  Allergen Reactions  . Adhesive [Tape] Rash  . Augmentin [Amoxicillin-Pot  Clavulanate] Itching and Rash    Review of Systems  Constitutional: Negative for fever, chills and weight loss.  Gastrointestinal: Positive for abdominal pain. Negative for heartburn, nausea, vomiting, diarrhea and constipation.  Genitourinary: Negative for dysuria and urgency.  Skin: Negative.   Neurological: Negative for dizziness and tingling.  Endo/Heme/Allergies: Negative for environmental allergies. Does not bruise/bleed easily.  Psychiatric/Behavioral: Negative.      Physical Exam  Constitutional: He is oriented to person, place, and time and well-developed, well-nourished, and in no distress. No  distress.  HENT:  Head: Normocephalic and atraumatic.  Eyes: Pupils are equal, round, and reactive to light. No scleral icterus.  Cardiovascular: Normal rate.   Pulmonary/Chest: Breath sounds normal. No respiratory distress.  Abdominal: Soft. Normal appearance and bowel sounds are normal. He exhibits distension. He exhibits no mass. There is tenderness. There is rebound. There is no guarding. A hernia is present.    Neurological: He is oriented to person, place, and time.  Skin: Skin is warm and dry. He is not diaphoretic.  Psychiatric: Mood, memory, affect and judgment normal.    Blood pressure 148/76, pulse 88, temperature 98.3 F (36.8 C), temperature source Oral, resp. rate 16, height 5' 10"  (1.778 m), weight 210 lb (95.255 kg), SpO2 93 %.  Results for orders placed or performed during the hospital encounter of 08/06/15 (from the past 48 hour(s))  Lipase, blood     Status: None   Collection Time: 08/06/15  2:19 PM  Result Value Ref Range   Lipase 25 11 - 51 U/L  Comprehensive metabolic panel     Status: Abnormal   Collection Time: 08/06/15  2:19 PM  Result Value Ref Range   Sodium 135 135 - 145 mmol/L   Potassium 3.4 (L) 3.5 - 5.1 mmol/L   Chloride 99 (L) 101 - 111 mmol/L   CO2 28 22 - 32 mmol/L   Glucose, Bld 126 (H) 65 - 99 mg/dL   BUN 17 6 - 20 mg/dL   Creatinine, Ser 0.69 0.61 - 1.24 mg/dL   Calcium 8.8 (L) 8.9 - 10.3 mg/dL   Total Protein 8.3 (H) 6.5 - 8.1 g/dL   Albumin 3.5 3.5 - 5.0 g/dL   AST 23 15 - 41 U/L   ALT 20 17 - 63 U/L   Alkaline Phosphatase 54 38 - 126 U/L   Total Bilirubin 1.4 (H) 0.3 - 1.2 mg/dL   GFR calc non Af Amer >60 >60 mL/min   GFR calc Af Amer >60 >60 mL/min    Comment: (NOTE) The eGFR has been calculated using the CKD EPI equation. This calculation has not been validated in all clinical situations. eGFR's persistently <60 mL/min signify possible Chronic Kidney Disease.    Anion gap 8 5 - 15  CBC     Status: Abnormal   Collection Time:  08/06/15  2:19 PM  Result Value Ref Range   WBC 10.8 (H) 3.8 - 10.6 K/uL   RBC 4.71 4.40 - 5.90 MIL/uL   Hemoglobin 13.3 13.0 - 18.0 g/dL   HCT 40.9 40.0 - 52.0 %   MCV 86.8 80.0 - 100.0 fL   MCH 28.3 26.0 - 34.0 pg   MCHC 32.6 32.0 - 36.0 g/dL   RDW 15.3 (H) 11.5 - 14.5 %   Platelets 185 150 - 440 K/uL  Urinalysis complete, with microscopic ([facility] only)     Status: Abnormal   Collection Time: 08/06/15  4:43 PM  Result Value Ref Range   Color, Urine  YELLOW (A) YELLOW   APPearance CLEAR (A) CLEAR   Glucose, UA NEGATIVE NEGATIVE mg/dL   Bilirubin Urine NEGATIVE NEGATIVE   Ketones, ur 1+ (A) NEGATIVE mg/dL   Specific Gravity, Urine >1.060 (H) 1.005 - 1.030   Hgb urine dipstick NEGATIVE NEGATIVE   pH 8.0 5.0 - 8.0   Protein, ur NEGATIVE NEGATIVE mg/dL   Nitrite NEGATIVE NEGATIVE   Leukocytes, UA NEGATIVE NEGATIVE   RBC / HPF 0-5 0 - 5 RBC/hpf   WBC, UA 0-5 0 - 5 WBC/hpf   Bacteria, UA NONE SEEN NONE SEEN   Squamous Epithelial / LPF NONE SEEN NONE SEEN  Lactic acid, plasma     Status: None   Collection Time: 08/06/15  4:44 PM  Result Value Ref Range   Lactic Acid, Venous 1.8 0.5 - 2.0 mmol/L  Protime-INR     Status: None   Collection Time: 08/06/15  4:44 PM  Result Value Ref Range   Prothrombin Time 14.7 11.4 - 15.0 seconds   INR 1.13   APTT     Status: None   Collection Time: 08/06/15  4:44 PM  Result Value Ref Range   aPTT 36 24 - 36 seconds  Type and screen [facility]     Status: None   Collection Time: 08/06/15  4:46 PM  Result Value Ref Range   ABO/RH(D) B POS    Antibody Screen NEG    Sample Expiration 08/09/2015   ABO/Rh     Status: None   Collection Time: 08/06/15  4:47 PM  Result Value Ref Range   ABO/RH(D) B POS    Ct Abdomen Pelvis W Contrast  08/06/2015  CLINICAL DATA:  Surgery for abdominal abscess in November 2016 secondary to diverticulitis. Recent abdominal pain and vomiting over the past several days. History bladder cancer.  EXAM: CT ABDOMEN AND PELVIS WITH CONTRAST TECHNIQUE: Multidetector CT imaging of the abdomen and pelvis was performed using the standard protocol following bolus administration of intravenous contrast. CONTRAST:  131m OMNIPAQUE IOHEXOL 300 MG/ML  SOLN COMPARISON:  06/28/2015 and 04/21/2011 FINDINGS: Lung bases are within normal. There is minimal calcification over the mitral valve annulus. Abdominal images demonstrate no change in mild cholelithiasis. No change in a sub cm hypodensity over the inferior right lobe of the liver. The spleen, pancreas and adrenal glands as well as the stomach are within normal. Kidneys normal in size without evidence of hydronephrosis or nephrolithiasis. There are 3 small right renal cortical hypodensities with the largest over the mid pole measuring 1.7 cm as these are unchanged from 2012 and likely cysts, although indeterminate Hounsfield units. Ureters are within normal. Appendix is normal. There is moderate to severe diverticulosis of the distal descending and sigmoid colon. There has been near complete resolution of the previously seen large sigmoid diverticular abscess post surgery. However, there is a irregular air collection anterior to the sigmoid colon in the previous region of the diverticular abscess which abuts the sigmoid colon but appears to be extraluminal. There is mild adjacent stranding of the mesenteric fat as this collection extends superior laterally parallel to the adjacent sigmoid colon and may represent a fistulous tract. This may represent postsurgical change, although cannot exclude an acute process/ recurrent diverticulitis. There is a tiny focus of free peritoneal air over the upper abdomen just left of midline which would not be expected to be present 3-4 weeks post surgery and may be the result of ongoing acute infection with perforation in the left lower  quadrant. There is evidence of a small umbilical/incisional hernia containing a partial loop of small  bowel. 3.2 cm infrarenal abdominal aortic aneurysm without significant change. At the superior edge of the aneurysm is a possible small intimal flap/focal dissection unchanged. Remaining pelvic images demonstrate the bladder, prostate and rectum to be within normal. Remainder of the exam is unchanged. IMPRESSION: Moderate colonic diverticulosis. Findings compatible with previous surgical drainage of sigmoid diverticular abscess. Persistent irregular air collection abutting the sigmoid colon in the left lower quadrant which appears to extend superior laterally paralleling the sigmoid/ distal descending colon and may represent a fistulous tract. Mild stranding of the adjacent fat which may be postsurgical, although cannot exclude acute/recurrent infection. Tiny focus of free peritoneal air over the upper abdomen left of midline typically not expected 3-4 weeks post surgery and may be the result of new subtle perforation. Small umbilical/incisional hernia containing partial segment small bowel without significant change. No bowel obstruction. Mild cholelithiasis. Sub cm liver hypodensity over the right lobe unchanged and likely a cyst or hemangioma. Few small right renal cortical hypodensities with the largest measuring 1.7 cm likely cysts and unchanged from 2012. 3.2 cm infrarenal abdominal aortic aneurysm unchanged. Suggestion of tiny focal intimal flap/dissection along the superior edge of the aneurysm unchanged. Recommend followup by ultrasound in 3 years. This recommendation follows ACR consensus guidelines: White Paper of the ACR Incidental Findings Committee II on Vascular Findings. J Am Coll Radiol 2013; 10:789-794. Critical Value/emergent results were called by telephone at the time of interpretation on 08/06/2015 at 6:00 pm to Dr. Charlotte Crumb , who verbally acknowledged these results. Electronically Signed   By: Marin Olp M.D.   On: 08/06/2015 18:00     Assessment/Plan  This is a 81 year old white  male with recurrent sigmoid diverticulitis which has been complicated in the past by perforation requiring surgical drainage. Plan for admission bowel rest and intravenous antibiotics and reassessment. I discussed with him and his wife briefly indications for surgical intervention they understand and wish to proceed with current treatment plan.   Hortencia Conradi, MD, FACS

## 2015-08-06 NOTE — Telephone Encounter (Signed)
Patient had a Laparoscopic of adhesions drained placed with [facility] 11/10 and possibly has diverticulitis, per wife he is in a lot of pain and cant eat, please call patients wife back.

## 2015-08-06 NOTE — ED Notes (Signed)
Pt had surgery for abdominal abscess in November for diverticulitis, pt reports abdominal pain with vomiting for several days, wife reports vomit is foul in smell

## 2015-08-06 NOTE — Telephone Encounter (Signed)
Spoke with patient's wife at this time. She states that he is vomiting- which has a horrible odor. Denies fever but is having chills. Severe bilateral lower quadrant abdominal pain. All symptoms began on Thursday and has only become worse since then.   Explained to patient's wife that she needs to take him to the Emergency room immediately. She verbalizes understanding.  Dr. Adonis Huguenin in office and was notified at this time.

## 2015-08-07 LAB — COMPREHENSIVE METABOLIC PANEL
ALK PHOS: 49 U/L (ref 38–126)
ALT: 16 U/L — AB (ref 17–63)
AST: 20 U/L (ref 15–41)
Albumin: 2.8 g/dL — ABNORMAL LOW (ref 3.5–5.0)
Anion gap: 6 (ref 5–15)
BUN: 11 mg/dL (ref 6–20)
CALCIUM: 8.1 mg/dL — AB (ref 8.9–10.3)
CO2: 22 mmol/L (ref 22–32)
CREATININE: 0.6 mg/dL — AB (ref 0.61–1.24)
Chloride: 106 mmol/L (ref 101–111)
Glucose, Bld: 148 mg/dL — ABNORMAL HIGH (ref 65–99)
Potassium: 3.6 mmol/L (ref 3.5–5.1)
Sodium: 134 mmol/L — ABNORMAL LOW (ref 135–145)
TOTAL PROTEIN: 6.2 g/dL — AB (ref 6.5–8.1)
Total Bilirubin: 1.1 mg/dL (ref 0.3–1.2)

## 2015-08-07 NOTE — Progress Notes (Signed)
[facility]   PATIENT NAME: David Morgan    MR#:  [id number]  DATE OF BIRTH:  1925-04-15  SUBJECTIVE:   The patient feels about the same. There has been no further nausea and vomiting. One small bowel movement. Wife is at bedside. REVIEW OF SYSTEMS:   Review of Systems  Constitutional: Negative for malaise/fatigue and diaphoresis.  Gastrointestinal: Positive for abdominal pain. Negative for nausea, vomiting and diarrhea.  Genitourinary: Negative for dysuria.  Neurological: Positive for weakness.  Psychiatric/Behavioral: Negative.     DRUG ALLERGIES:   Allergies  Allergen Reactions  . Adhesive [Tape] Rash  . Augmentin [Amoxicillin-Pot Clavulanate] Itching and Rash    VITALS:  Blood pressure 121/62, pulse 86, temperature 98 F (36.7 C), temperature source Oral, resp. rate 18, height 5\' 10"  (1.778 m), weight 205 lb 12.8 oz (93.35 kg), SpO2 95 %.  Filed Vitals:   08/07/15 0444 08/07/15 0500 08/07/15 0750 08/07/15 1252  BP: 142/68  140/64 121/62  Pulse: 86  87 86  Temp: 98.3 F (36.8 C)   98 F (36.7 C)  TempSrc: Oral   Oral  Resp: 20   18  Height:      Weight:  205 lb 12.8 oz (93.35 kg)    SpO2: 97%  95% 95%   I/O last 3 completed shifts: In: 502 [I.V.:502] Out: 550 [Urine:550] Total I/O In: [id number] [I.V.:1041] Out: -   CBC Latest Ref Rng 08/06/2015 07/03/2015 07/01/2015  WBC 3.8 - 10.6 K/uL 10.8(H) 11.5(H) 9.8  Hemoglobin 13.0 - 18.0 g/dL 13.3 12.7(L) 14.0  Hematocrit 40.0 - 52.0 % 40.9 38.4(L) 43.4  Platelets 150 - 440 K/uL 185 266 230    BMP Latest Ref Rng 08/07/2015 08/06/2015 07/03/2015  Glucose 65 - 99 mg/dL 148(H) 126(H) 113(H)  BUN 6 - 20 mg/dL 11 17 15   Creatinine 0.61 - 1.24 mg/dL 0.60(L) 0.69 0.72  Sodium 135 - 145 mmol/L 134(L) 135 134(L)  Potassium 3.5 - 5.1 mmol/L 3.6 3.4(L) 4.1  Chloride 101 - 111 mmol/L 106 99(L) 105  CO2 22 - 32 mmol/L 22 28 24   Calcium 8.9 - 10.3 mg/dL 8.1(L) 8.8(L) 7.9(L)     PHYSICAL EXAMINATION:   Physical Exam  Constitutional: He is oriented to person, place, and time and well-developed, well-nourished, and in no distress. No distress.  HENT:  Head: Normocephalic and atraumatic.  Eyes: Pupils are equal, round, and reactive to light.  Cardiovascular: Normal rate and regular rhythm.   Pulmonary/Chest: Effort normal. No respiratory distress. He has no wheezes.  Abdominal: Soft. He exhibits no distension. There is tenderness. There is guarding. There is no rebound.  Essentially unchanged examination from admission.  Neurological: He is oriented to person, place, and time.  Skin: Skin is warm and dry. He is not diaphoretic.  Psychiatric: Affect normal.      ASSESSMENT AND PLAN:   Recurrent perforated sigmoid diverticulitis with focal left lower quadrant abdominal pain.  At this point it is too early to tell whether he will require surgical intervention. Continue intravenous antibiotics and reassessment later this afternoon. Both him and his wife are in agreement with this plan.

## 2015-08-07 NOTE — Progress Notes (Signed)
Initial Nutrition Assessment     INTERVENTION:  Meals and snacks: Await diet progression   NUTRITION DIAGNOSIS:   Inadequate oral intake related to altered GI function as evidenced by NPO status.    GOAL:   Patient will meet greater than or equal to 90% of their needs    MONITOR:    (Energy intake, Digestive system)  REASON FOR ASSESSMENT:   Malnutrition Screening Tool    ASSESSMENT:      Pt admitted with recurrent diverticulitis.  In Nov pt with diverticular abscess and drain  Past Medical History  Diagnosis Date  . Lesion of bladder   . History of bladder cancer   . Hypertension   . Hyperlipidemia   . Peripheral vascular disease ([place])   . Claudication ([place])   . Emphysema/COPD ([place])   . Mild asthma     NO INHALER  . Short of breath on exertion   . Hypothyroidism   . Urgency of urination   . Nocturia   . Arthritis   . Wears glasses   . History of TIA (transient ischemic attack)     2012--  NO RESIDUAL (PER SCAN HAD A PREVIOUS TIA BEFORE 2012)  . History of carotid artery stenosis   . Cancer ([place])   . Diverticulitis     Current Nutrition: NPO  Food/Nutrition-Related History: Pt reports decreased intake for the past 2 days prior to admission. Mainly drinking liquids during this time (ensure, potato soup, juice)   Scheduled Medications:  . amLODipine  2.5 mg Oral q morning - 10a  . atorvastatin  40 mg Oral QPM  . carvedilol  3.125 mg Oral BID WC  . ciprofloxacin  400 mg Intravenous Once  . ciprofloxacin  400 mg Intravenous Q12H  . enoxaparin (LOVENOX) injection  40 mg Subcutaneous Q24H  . gabapentin  100 mg Oral TID  . levothyroxine  150 mcg Oral QPM  . losartan  50 mg Oral Daily  . metronidazole  500 mg Intravenous Q8H  . tamsulosin  0.8 mg Oral QPM    Continuous Medications:  . dextrose 5 % and 0.45 % NaCl with KCl 20 mEq/L 125 mL/hr at 08/07/15 0944     Electrolyte/Renal Profile and Glucose Profile:   Recent Labs Lab 08/06/15 1419  08/07/15 0410  NA 135 134*  K 3.4* 3.6  CL 99* 106  CO2 28 22  BUN 17 11  CREATININE 0.69 0.60*  CALCIUM 8.8* 8.1*  GLUCOSE 126* 148*   Protein Profile:  Recent Labs Lab 08/06/15 1419 08/07/15 0410  ALBUMIN 3.5 2.8*    Gastrointestinal Profile: Last BM: 12/19   Nutrition-Focused Physical Exam Findings: Nutrition-Focused physical exam completed. Findings are no fat depletion, no muscle depletion, and mild edema.      Weight Change: 6% wt loss in the last 2 months per wife    Diet Order:  Diet NPO time specified Except for: Sips with Meds  Skin:   reviewed     Height:   Ht Readings from Last 1 Encounters:  08/06/15 5\' 10"  (1.778 m)    Weight:   Wt Readings from Last 1 Encounters:  08/07/15 205 lb 12.8 oz (93.35 kg)    Ideal Body Weight:     BMI:  Body mass index is 29.53 kg/(m^2).  Estimated Nutritional Needs:   Kcal:  BEE 1606 kcals (IF 1.0-1.3, AF 1.3) [id number] kcals/d.   Protein:  (1.1-1.3 g/kg) 102-120 g/d  Fluid:  (25-52ml/kg) 2325-2762ml/d  EDUCATION NEEDS:   No  education needs identified at this time  Admire. Zenia Resides, [place], [place] (pager) Weekend/On-Call pager [phone])

## 2015-08-07 NOTE — Plan of Care (Signed)
Problem: Nutrition: Goal: Adequate nutrition will be maintained Outcome: Not Progressing Pt is still NPO status for bowel rest.

## 2015-08-08 LAB — BASIC METABOLIC PANEL
ANION GAP: 5 (ref 5–15)
BUN: 6 mg/dL (ref 6–20)
CHLORIDE: 105 mmol/L (ref 101–111)
CO2: 24 mmol/L (ref 22–32)
Calcium: 8.3 mg/dL — ABNORMAL LOW (ref 8.9–10.3)
Creatinine, Ser: 0.5 mg/dL — ABNORMAL LOW (ref 0.61–1.24)
Glucose, Bld: 151 mg/dL — ABNORMAL HIGH (ref 65–99)
POTASSIUM: 3.5 mmol/L (ref 3.5–5.1)
SODIUM: 134 mmol/L — AB (ref 135–145)

## 2015-08-08 LAB — CBC
HCT: 37.4 % — ABNORMAL LOW (ref 40.0–52.0)
HEMOGLOBIN: 12.3 g/dL — AB (ref 13.0–18.0)
MCH: 28.5 pg (ref 26.0–34.0)
MCHC: 32.9 g/dL (ref 32.0–36.0)
MCV: 86.5 fL (ref 80.0–100.0)
PLATELETS: 159 10*3/uL (ref 150–440)
RBC: 4.32 MIL/uL — AB (ref 4.40–5.90)
RDW: 14.8 % — ABNORMAL HIGH (ref 11.5–14.5)
WBC: 6.9 10*3/uL (ref 3.8–10.6)

## 2015-08-08 NOTE — Care Management (Signed)
Patient is open to [facility]

## 2015-08-08 NOTE — H&P (Signed)
David Morgan is an 81 y.o. male.    Chief Complaint: Abdominal pain and vomiting  HPI:  This is an 81 year old male with a history of high blood pressure and diverticulitis who in early November of this year presented with a complicated diverticular abscess not amenable to interventional drainage and underwent laparoscopic drainage of the abscess. He was discharged home with drains in place following a course of intravenous antibiotics. He completed outpatient oral antibiotics and did well up until Wednesday of last week when he began having left lower quadrant abdominal pain along with nausea and vomiting.   He called the office this morning seeking medical attention and was sent directly to the emergency room. While in the emergency room the patient's been given intravenous antibiotics following evaluation by the staff found have a white count which was 10.8. Due to his history a CT scan of the abdomen and pelvis was performed and I personally reviewed. There is evidence of microperforation. No obvious free fluid or abscess is seen on the CT scan. Surgical services were asked to evaluate and manage.   Past Medical History  Diagnosis Date  . Lesion of bladder   . History of bladder cancer   . Hypertension   . Hyperlipidemia   . Peripheral vascular disease ([place])   . Claudication ([place])   . Emphysema/COPD ([place])   . Mild asthma     NO INHALER  . Short of breath on exertion   . Hypothyroidism   . Urgency of urination   . Nocturia   . Arthritis   . Wears glasses   . History of TIA (transient ischemic attack)     2012-- NO RESIDUAL (PER SCAN HAD A PREVIOUS TIA BEFORE 2012)  . History of carotid artery stenosis   . Cancer ([employer])   . Diverticulitis     Past Surgical History  Procedure Laterality Date  . Transurethral resection of bladder tumor  1990  . Total knee arthroplasty  Right 2004  . Carotid endarterectomy Right 2005  . Carpal tunnel release Bilateral 2002 & 2007  . Right shoulder surgery  2005  . Orif hip fracture Left 2008    RETAINED HARDWARE  . Umbilical hernia repair  2009 & 2011  . Inguinal hernia repair  YRS AGO  . Cataract extraction w/ intraocular lens implant, bilateral    . Cystoscopy with biopsy N/A 12/26/2013    Procedure: CYSTOSCOPY WITH BLADDER BIOPSY; Surgeon: Claybon Jabs, MD; Location: [facility]; Service: Urology; Laterality: N/A;  . Cystoscopy w/ retrogrades Bilateral 12/26/2013    Procedure: BILATERAL RETROGRADE PYELOGRAM; Surgeon: Claybon Jabs, MD; Location[facility]; Service: Urology; Laterality: Bilateral;  . Laparoscopy  06/28/2015    Procedure: LAPAROSCOPY DIAGNOSTIC; Surgeon: Clayburn Pert, MD; Location: [facility]; Service: General;;  . Laparoscopic lysis of adhesions  06/28/2015    Procedure: LAPAROSCOPIC LYSIS OF ADHESIONS; Surgeon: Clayburn Pert, MD; Location: [facility]; Service: General;;    Social History:  reports that he quit smoking about 21 years ago. His smoking use included Cigarettes. He has a 40 pack-year smoking history. He has never used smokeless tobacco. He reports that he drinks alcohol. He reports that he does not use illicit drugs.   Allergies:  Allergies  Allergen Reactions  . Adhesive [Tape] Rash  . Augmentin [Amoxicillin-Pot Clavulanate] Itching and Rash    Review of Systems  Constitutional: Negative for fever, chills and weight loss.  Gastrointestinal: Positive for abdominal pain. Negative for heartburn, nausea, vomiting,  diarrhea and constipation.  Genitourinary: Negative for dysuria and urgency.  Skin: Negative.  Neurological: Negative for dizziness and tingling.  Endo/Heme/Allergies: Negative for environmental allergies. Does not bruise/bleed easily.    Psychiatric/Behavioral: Negative.     Physical Exam  Constitutional: He is oriented to person, place, and time and well-developed, well-nourished, and in no distress. No distress.  HENT:  Head: Normocephalic and atraumatic.  Eyes: Pupils are equal, round, and reactive to light. No scleral icterus.  Cardiovascular: Normal rate.  Pulmonary/Chest: Breath sounds normal. No respiratory distress.  Abdominal: Soft. Normal appearance and bowel sounds are normal. He exhibits distension. He exhibits no mass. There is tenderness. There is rebound. There is no guarding. A hernia is present.    Neurological: He is oriented to person, place, and time.  Skin: Skin is warm and dry. He is not diaphoretic.  Psychiatric: Mood, memory, affect and judgment normal.    Blood pressure 148/76, pulse 88, temperature 98.3 F (36.8 C), temperature source Oral, resp. rate 16, height _0  (1.778 m), weight 210 lb (95.255 kg), SpO2 93 %.   Lab Results Last 48 Hours    Results for orders placed or performed during the hospital encounter of 08/06/15 (from the past 48 hour(s))  Lipase, blood Status: None   Collection Time: 08/06/15 2:19 PM  Result Value Ref Range   Lipase 25 11 - 51 U/L  Comprehensive metabolic panel Status: Abnormal   Collection Time: 08/06/15 2:19 PM  Result Value Ref Range   Sodium 135 135 - 145 mmol/L   Potassium 3.4 (L) 3.5 - 5.1 mmol/L   Chloride 99 (L) 101 - 111 mmol/L   CO2 28 22 - 32 mmol/L   Glucose, Bld 126 (H) 65 - 99 mg/dL   BUN 17 6 - 20 mg/dL   Creatinine, Ser 0.69 0.61 - 1.24 mg/dL   Calcium 8.8 (L) 8.9 - 10.3 mg/dL   Total Protein 8.3 (H) 6.5 - 8.1 g/dL   Albumin 3.5 3.5 - 5.0 g/dL   AST 23 15 - 41 U/L   ALT 20 17 - 63 U/L   Alkaline Phosphatase 54 38 - 126 U/L   Total Bilirubin 1.4 (H) 0.3 - 1.2 mg/dL   GFR calc non Af Amer >60 >60 mL/min   GFR calc Af Amer >60 >60 mL/min     Comment: (NOTE) The eGFR has been calculated using the CKD EPI equation. This calculation has not been validated in all clinical situations. eGFR's persistently <60 mL/min signify possible Chronic Kidney Disease.    Anion gap 8 5 - 15  CBC Status: Abnormal   Collection Time: 08/06/15 2:19 PM  Result Value Ref Range   WBC 10.8 (H) 3.8 - 10.6 K/uL   RBC 4.71 4.40 - 5.90 MIL/uL   Hemoglobin 13.3 13.0 - 18.0 g/dL   HCT 40.9 40.0 - 52.0 %   MCV 86.8 80.0 - 100.0 fL   MCH 28.3 26.0 - 34.0 pg   MCHC 32.6 32.0 - 36.0 g/dL   RDW 15.3 (H) 11.5 - 14.5 %   Platelets 185 150 - 440 K/uL  Urinalysis complete, with microscopic ([facility] only) Status: Abnormal   Collection Time: 08/06/15 4:43 PM  Result Value Ref Range   Color, Urine YELLOW (A) YELLOW   APPearance CLEAR (A) CLEAR   Glucose, UA NEGATIVE NEGATIVE mg/dL   Bilirubin Urine NEGATIVE NEGATIVE   Ketones, ur 1+ (A) NEGATIVE mg/dL   Specific Gravity, Urine >1.060 (H) 1.005 - 1.030   Hgb  urine dipstick NEGATIVE NEGATIVE   pH 8.0 5.0 - 8.0   Protein, ur NEGATIVE NEGATIVE mg/dL   Nitrite NEGATIVE NEGATIVE   Leukocytes, UA NEGATIVE NEGATIVE   RBC / HPF 0-5 0 - 5 RBC/hpf   WBC, UA 0-5 0 - 5 WBC/hpf   Bacteria, UA NONE SEEN NONE SEEN   Squamous Epithelial / LPF NONE SEEN NONE SEEN  Lactic acid, plasma Status: None   Collection Time: 08/06/15 4:44 PM  Result Value Ref Range   Lactic Acid, Venous 1.8 0.5 - 2.0 mmol/L  Protime-INR Status: None   Collection Time: 08/06/15 4:44 PM  Result Value Ref Range   Prothrombin Time 14.7 11.4 - 15.0 seconds   INR 1.13   APTT Status: None   Collection Time: 08/06/15 4:44 PM  Result Value Ref Range   aPTT 36 24 - 36 seconds  Type and screen [place] Status: None   Collection Time: 08/06/15 4:46  PM  Result Value Ref Range   ABO/RH(D) B POS    Antibody Screen NEG    Sample Expiration 08/09/2015   ABO/Rh Status: None   Collection Time: 08/06/15 4:47 PM  Result Value Ref Range   ABO/RH(D) B POS       Imaging Results (Last 48 hours)    Ct Abdomen Pelvis W Contrast  08/06/2015 CLINICAL DATA: Surgery for abdominal abscess in November 2016 secondary to diverticulitis. Recent abdominal pain and vomiting over the past several days. History bladder cancer. EXAM: CT ABDOMEN AND PELVIS WITH CONTRAST TECHNIQUE: Multidetector CT imaging of the abdomen and pelvis was performed using the standard protocol following bolus administration of intravenous contrast. CONTRAST: 111m OMNIPAQUE IOHEXOL 300 MG/ML SOLN COMPARISON: 06/28/2015 and 04/21/2011 FINDINGS: Lung bases are within normal. There is minimal calcification over the mitral valve annulus. Abdominal images demonstrate no change in mild cholelithiasis. No change in a sub cm hypodensity over the inferior right lobe of the liver. The spleen, pancreas and adrenal glands as well as the stomach are within normal. Kidneys normal in size without evidence of hydronephrosis or nephrolithiasis. There are 3 small right renal cortical hypodensities with the largest over the mid pole measuring 1.7 cm as these are unchanged from 2012 and likely cysts, although indeterminate Hounsfield units. Ureters are within normal. Appendix is normal. There is moderate to severe diverticulosis of the distal descending and sigmoid colon. There has been near complete resolution of the previously seen large sigmoid diverticular abscess post surgery. However, there is a irregular air collection anterior to the sigmoid colon in the previous region of the diverticular abscess which abuts the sigmoid colon but appears to be extraluminal. There is mild adjacent stranding of the mesenteric fat as this collection extends superior laterally parallel to the  adjacent sigmoid colon and may represent a fistulous tract. This may represent postsurgical change, although cannot exclude an acute process/ recurrent diverticulitis. There is a tiny focus of free peritoneal air over the upper abdomen just left of midline which would not be expected to be present 3-4 weeks post surgery and may be the result of ongoing acute infection with perforation in the left lower quadrant. There is evidence of a small umbilical/incisional hernia containing a partial loop of small bowel. 3.2 cm infrarenal abdominal aortic aneurysm without significant change. At the superior edge of the aneurysm is a possible small intimal flap/focal dissection unchanged. Remaining pelvic images demonstrate the bladder, prostate and rectum to be within normal. Remainder of the exam is unchanged. IMPRESSION: Moderate  colonic diverticulosis. Findings compatible with previous surgical drainage of sigmoid diverticular abscess. Persistent irregular air collection abutting the sigmoid colon in the left lower quadrant which appears to extend superior laterally paralleling the sigmoid/ distal descending colon and may represent a fistulous tract. Mild stranding of the adjacent fat which may be postsurgical, although cannot exclude acute/recurrent infection. Tiny focus of free peritoneal air over the upper abdomen left of midline typically not expected 3-4 weeks post surgery and may be the result of new subtle perforation. Small umbilical/incisional hernia containing partial segment small bowel without significant change. No bowel obstruction. Mild cholelithiasis. Sub cm liver hypodensity over the right lobe unchanged and likely a cyst or hemangioma. Few small right renal cortical hypodensities with the largest measuring 1.7 cm likely cysts and unchanged from 2012. 3.2 cm infrarenal abdominal aortic aneurysm unchanged. Suggestion of tiny focal intimal flap/dissection along the superior edge of the aneurysm unchanged.  Recommend followup by ultrasound in 3 years. This recommendation follows ACR consensus guidelines: White Paper of the ACR Incidental Findings Committee II on Vascular Findings. J Am Coll Radiol 2013; 10:789-794. Critical Value/emergent results were called by telephone at the time of interpretation on 08/06/2015 at 6:00 pm to Dr. Charlotte Crumb , who verbally acknowledged these results. Electronically Signed By: Marin Olp M.D. On: 08/06/2015 18:00      Assessment/Plan  This is a 81 year old white male with recurrent sigmoid diverticulitis which has been complicated in the past by perforation requiring surgical drainage. Plan for admission bowel rest and intravenous antibiotics and reassessment. I discussed with him and his wife briefly indications for surgical intervention they understand and wish to proceed with current treatment plan.   Hortencia Conradi, MD, FACS

## 2015-08-08 NOTE — Progress Notes (Signed)
[facility]  Patient Status: active  [facility] is providing the following services: SN/PT  If patient discharges after hours, please call [phone].   David Morgan 08/08/2015, 11:27 AM

## 2015-08-08 NOTE — Progress Notes (Signed)
[facility]   PATIENT NAME: David Morgan    MR#:  [id number]  DATE OF BIRTH:  [birth year]  SUBJECTIVE:  He is feeling much better. He is continuing to pass gas. No nausea no vomiting.  His main complaint is that of arthritis and his hand and elbow. Wife at bedside.  He is complaining of much less pain than he was yesterday and significantly less than upon admission.  REVIEW OF SYSTEMS:   Review of Systems  Constitutional: Negative for fever and chills.  Respiratory: Negative for cough.   Cardiovascular: Negative for chest pain and palpitations.  Gastrointestinal: Positive for abdominal pain. Negative for heartburn, nausea, vomiting and diarrhea.  Genitourinary: Negative for dysuria.  Neurological: Positive for headaches.    DRUG ALLERGIES:   Allergies  Allergen Reactions  . Adhesive [Tape] Rash  . Augmentin [Amoxicillin-Pot Clavulanate] Itching and Rash    VITALS:  Blood pressure 147/68, pulse 78, temperature 98 F (36.7 C), temperature source Oral, resp. rate 20, height 5\' 10"  (1.778 m), weight 204 lb 14.4 oz (92.942 kg), SpO2 97 %.  Filed Vitals:   08/07/15 1252 08/07/15 2218 08/08/15 0500 08/08/15 0540  BP: 121/62 133/68  147/68  Pulse: 86 82  78  Temp: 98 F (36.7 C) 98.1 F (36.7 C)  98 F (36.7 C)  TempSrc: Oral Oral  Oral  Resp: 18 18  20   Height:      Weight:   204 lb 14.4 oz (92.942 kg)   SpO2: 95% 96%  97%   I/O last 3 completed shifts: In: 3320 [I.V.:3320] Out: 1925 [Urine:1925] Total I/O In: 447 [I.V.:447] Out: 250 [Urine:250]   PHYSICAL EXAMINATION:  Physical Exam  Constitutional: He is oriented to person, place, and time and well-developed, well-nourished, and in no distress.  HENT:  Head: Normocephalic.  Eyes: Pupils are equal, round, and reactive to light. No scleral icterus.  Cardiovascular: Normal rate.   Abdominal: Soft. He exhibits no distension. There is tenderness in the left lower quadrant. There is no rigidity  and no guarding.    Neurological: He is oriented to person, place, and time.  Skin: Skin is dry. He is not diaphoretic.  Psychiatric: Mood, memory, affect and judgment normal.   CBC Latest Ref Rng 08/06/2015 07/03/2015 07/01/2015  WBC 3.8 - 10.6 K/uL 10.8(H) 11.5(H) 9.8  Hemoglobin 13.0 - 18.0 g/dL 13.3 12.7(L) 14.0  Hematocrit 40.0 - 52.0 % 40.9 38.4(L) 43.4  Platelets 150 - 440 K/uL 185 266 230    BMP Latest Ref Rng 08/07/2015 08/06/2015 07/03/2015  Glucose 65 - 99 mg/dL 148(H) 126(H) 113(H)  BUN 6 - 20 mg/dL 11 17 15   Creatinine 0.61 - 1.24 mg/dL 0.60(L) 0.69 0.72  Sodium 135 - 145 mmol/L 134(L) 135 134(L)  Potassium 3.5 - 5.1 mmol/L 3.6 3.4(L) 4.1  Chloride 101 - 111 mmol/L 106 99(L) 105  CO2 22 - 32 mmol/L 22 28 24   Calcium 8.9 - 10.3 mg/dL 8.1(L) 8.8(L) 7.9(L)       ASSESSMENT AND PLAN:   Continued clinical improvement.  Plan continue intravenous antibiotics clear liquid diet clinical reassessment. Upon resolution of this process the patient will need to be seen in our office for consideration of colonoscopy and possibly colon elective resection.

## 2015-08-08 NOTE — Care Management (Signed)
Spoke with patient who is familiar to  Me froma previous visit. From Home with spouse alert and oriented. Spouse is at the bedside. Very happy with home health services provided by [facility] . Patient stated that he is feeling much better. Will need resumption of home health orders at discharge.

## 2015-08-08 NOTE — Care Management Important Message (Signed)
Important Message  Patient Details  Name: David Morgan MRN: [id number] Date of Birth: 1924/10/13   Medicare Important Message Given:  Yes    Juliann Pulse A Josep Luviano 08/08/2015, 1:51 PM

## 2015-08-08 NOTE — Progress Notes (Signed)
Physical Therapy Evaluation Patient Details Name: David Morgan MRN: [id number] DOB: [birth year] Today's Date: 08/08/2015   History of Present Illness  This is an 81 year old male with a history of high blood pressure and diverticulitis who in early November of this year presented with a complicated diverticular abscess not amenable to interventional drainage and underwent laparoscopic drainage of the abscess. He was discharged home with drains in place following a course of intravenous antibiotics. He completed outpatient oral antibiotics and did well up until Wednesday of last week when he began having left lower quadrant abdominal pain along with nausea and vomiting.   Clinical Impression  Pt presents with decreased bed mobility, transfers, and gait and would benefit from acute PT services to address objective findings.  Pt requires Min Guard assist for functional mobility and Min Guard assist for ambulation 20 feet.  Pt with overall general weakness and decreased safety with dynamic standing activities.  Pt with L knee pain and R hand/elbow pain.        Follow Up Recommendations Home health PT    Equipment Recommendations  Rolling walker with 5" wheels    Recommendations for Other Services       Precautions / Restrictions Precautions Precautions: Fall Restrictions Weight Bearing Restrictions: No      Mobility  Bed Mobility Overal bed mobility: Needs Assistance Bed Mobility: Supine to Sit     Supine to sit: Min guard     General bed mobility comments: Supine> sit with HOB elevated and use of bed rails.  Transfers Overall transfer level: Needs assistance Equipment used: 4-wheeled walker Transfers: Sit to/from Stand Sit to Stand: Min guard         General transfer comment: Locks walker prior to standing and sitting independently; extra effor/time to rise from sitting and fair eccentric control with sitting.  Ambulation/Gait Ambulation/Gait assistance: Min  guard Ambulation Distance (Feet): 20 Feet Assistive device: 4-wheeled walker Gait Pattern/deviations: Step-through pattern;Decreased stance time - left;Antalgic     General Gait Details: Decreased L knee flexion during swing, walking with stiff L leg due to increased pain in knee; widened BOS with forward trunk flexion and walker out in front of patient.  Stairs            Wheelchair Mobility    Modified Rankin (Stroke Patients Only)       Balance Overall balance assessment: Modified Independent                                           Pertinent Vitals/Pain Pain Assessment: 0-10 Pain Location: R hand and elbow Pain Descriptors / Indicators: Aching Pain Intervention(s): Limited activity within patient's tolerance;Monitored during session    Home Living Family/patient expects to be discharged to:: Private residence Living Arrangements: Spouse/significant other Available Help at Discharge: Family;Available PRN/intermittently Type of Home: House Home Access: Ramped entrance     Home Layout: One level Home Equipment: Freedom - 4 wheels;Shower seat;Grab bars - toilet;Grab bars - tub/shower;Toilet riser;Walker - 2 wheels Additional Comments: RW at home; Rollator in community    Prior Function Level of Independence: Independent with assistive device(s)         Comments: Pt getting home health PT prior to admission.  At baseline pt able to "walk from one side of [employer] to the other".       Hand Dominance  Extremity/Trunk Assessment   Upper Extremity Assessment: Generalized weakness           Lower Extremity Assessment: Generalized weakness         Communication   Communication: No difficulties  Cognition Arousal/Alertness: Awake/alert Behavior During Therapy: WFL for tasks assessed/performed Overall Cognitive Status: Within Functional Limits for tasks assessed                      General Comments General comments  (skin integrity, edema, etc.): no swelling or redness in R UE noted    Exercises General Exercises - Lower Extremity Ankle Circles/Pumps: Strengthening;Both;10 reps;Seated [name] Arc Quad: Strengthening;Both;10 reps;Seated Hip ABduction/ADduction: Strengthening;Both;10 reps;Seated Toe Raises: Strengthening;Both;10 reps;Seated Heel Raises: Strengthening;Both;10 reps;Seated      Assessment/Plan    PT Assessment Patient needs continued PT services  PT Diagnosis Difficulty walking;Generalized weakness;Acute pain   PT Problem List Decreased strength;Decreased activity tolerance;Decreased balance;Decreased mobility;Pain  PT Treatment Interventions Gait training;Functional mobility training;Therapeutic activities;Therapeutic exercise;Balance training;Patient/family education   PT Goals (Current goals can be found in the Care Plan section) Acute Rehab PT Goals Patient Stated Goal: To go back home. PT Goal Formulation: With patient Time For Goal Achievement: 08/15/15 Potential to Achieve Goals: Good    Frequency Min 2X/week   Barriers to discharge        Co-evaluation               End of Session Equipment Utilized During Treatment: Gait belt Activity Tolerance: Patient tolerated treatment well;Patient limited by pain (knee and elbow) Patient left: in chair;with call bell/phone within reach;with chair alarm set;with family/visitor present Nurse Communication: Mobility status         Time: 1230-1258 PT Time Calculation (min) (ACUTE ONLY): 28 min   Charges:   PT Evaluation $Initial PT Evaluation Tier I: 1 Procedure PT Treatments $Therapeutic Exercise: 8-22 mins   PT G Codes:        Hindy Perrault A Jisella Ashenfelter 2015-09-04, 1:10 PM

## 2015-08-09 LAB — CBC
HCT: 35.9 % — ABNORMAL LOW (ref 40.0–52.0)
Hemoglobin: 11.6 g/dL — ABNORMAL LOW (ref 13.0–18.0)
MCH: 28 pg (ref 26.0–34.0)
MCHC: 32.2 g/dL (ref 32.0–36.0)
MCV: 87 fL (ref 80.0–100.0)
PLATELETS: 161 10*3/uL (ref 150–440)
RBC: 4.13 MIL/uL — AB (ref 4.40–5.90)
RDW: 14.9 % — AB (ref 11.5–14.5)
WBC: 6.5 10*3/uL (ref 3.8–10.6)

## 2015-08-09 NOTE — Progress Notes (Signed)
Surgery  He is feeling better. He's having multiple formed stools. He is having a minor amount of pain. He is very interested in being discharged prior to the holidays.  Filed Vitals:   08/08/15 0540 08/08/15 1341 08/08/15 2032 08/09/15 0647  BP: 147/68 159/65 103/53 132/60  Pulse: 78 82 80 76  Temp: 98 F (36.7 C) 98.8 F (37.1 C) 98.7 F (37.1 C) 97.4 F (36.3 C)  TempSrc: Oral Oral Oral Oral  Resp: 20 18 18 19   Height:      Weight:      SpO2: 97% 99% 97% 97%    I/O last 3 completed shifts: In: [id number] [P.O.:1050; I.V.:4439; IV Piggyback:199] Out: 2400 [Urine:2400] Total I/O In: 241.4 [P.O.:100; I.V.:141.4] Out: 200 [Urine:200]  CBC Latest Ref Rng 08/09/2015 08/08/2015 08/06/2015  WBC 3.8 - 10.6 K/uL 6.5 6.9 10.8(H)  Hemoglobin 13.0 - 18.0 g/dL 11.6(L) 12.3(L) 13.3  Hematocrit 40.0 - 52.0 % 35.9(L) 37.4(L) 40.9  Platelets 150 - 440 K/uL 161 159 185     On physical examination his abdomen is soft there is minimal tenderness in the left lower quadrant.  Impression resolving recurrent sigmoid diverticulitis with microperforation.  Plan advance diet continue intravenous Flagyl and Cipro. Anticipate discharge within the next 24-48 hours based on continued clinical improvement.

## 2015-08-10 MED ORDER — CIPROFLOXACIN HCL 500 MG PO TABS: 500.0000 mg | ORAL_TABLET | Freq: Two times a day (BID) | ORAL | Status: DC

## 2015-08-10 MED ORDER — HYDROCODONE-ACETAMINOPHEN 5-325 MG PO TABS: 1.0000 | ORAL_TABLET | Freq: Four times a day (QID) | ORAL | Status: DC | PRN

## 2015-08-10 MED ORDER — FLAGYL 500 MG PO TABS: 500.0000 mg | ORAL_TABLET | Freq: Three times a day (TID) | ORAL | Status: DC

## 2015-08-10 MED ORDER — ONDANSETRON HCL 4 MG PO TABS: 4.0000 mg | ORAL_TABLET | Freq: Four times a day (QID) | ORAL | Status: DC | PRN

## 2015-08-10 NOTE — Progress Notes (Signed)
Pt d/c home; d/c instructions reviewed w/ pt; pt understanding was verbalized; IV removed catheter in tact, gauze dressing applied; all pt questions answered; pt left unit via wheelchair accompanied by staff 

## 2015-08-10 NOTE — Discharge Instructions (Signed)
Diverticulitis Diverticulitis is when small pockets that have formed in your colon (large intestine) become infected or swollen. HOME CARE  Follow your doctor's instructions.  Follow a special diet if told by your doctor.  When you feel better, your doctor may tell you to change your diet. You may be told to eat a lot of fiber. Fruits and vegetables are good sources of fiber. Fiber makes it easier to poop (have bowel movements).  Take supplements or probiotics as told by your doctor.  Only take medicines as told by your doctor.  Keep all follow-up visits with your doctor. GET HELP IF:  Your pain does not get better.  You have a hard time eating food.  You are not pooping like normal. GET HELP RIGHT AWAY IF:  Your pain gets worse.  Your problems do not get better.  Your problems suddenly get worse.  You have a fever.  You keep throwing up (vomiting).  You have bloody or black, tarry poop (stool). MAKE SURE YOU:   Understand these instructions.  Will watch your condition.  Will get help right away if you are not doing well or get worse.   This information is not intended to replace advice given to you by your health care provider. Make sure you discuss any questions you have with your health care provider.   Document Released: 01/21/2008 Document Revised: 08/09/2013 Document Reviewed: 06/29/2013 Elsevier Interactive Patient Education 2016 [employer].  Follow all MD discharge instructions. Take all medications as prescribed. Keep all follow up appointments. If your symptoms return, call your doctor. If you experience any new symptoms that are of concern to you or that are bothersome to you, call your doctor. For all questions and/or concerns, call your doctor.  If your experience any pain, nausea, vomiting, or bloody stools, call your doctor.    If you have a medical emergency, call 911

## 2015-08-10 NOTE — Care Management Important Message (Signed)
Important Message  Patient Details  Name: David Morgan MRN: [id number] Date of Birth: Dec 29, 1924   Medicare Important Message Given:  Yes    Bethsaida Siegenthaler A, RN 08/10/2015, 8:35 AM

## 2015-08-10 NOTE — Progress Notes (Signed)
Patient ID: David Morgan, male   DOB: 03-11-1925, 81 y.o.   MRN: [id number]   The patient is without complaints. He is tolerating a regular diet. Several small bowel movements yesterday. No fevers.  Filed Vitals:   08/09/15 2100 08/10/15 0500 08/10/15 0621 08/10/15 1045  BP: 138/65  141/69 131/61  Pulse: 88  83 85  Temp: 97.9 F (36.6 C)  97.9 F (36.6 C) 98 F (36.7 C)  TempSrc: Oral  Oral Oral  Resp: 19  19 20   Height:      Weight:  202 lb 11.2 oz (91.944 kg)    SpO2: 96%  97% 97%   Physical examination the patient's abdomen is soft and nontender. There is no peritoneal signs present. He is alert and oriented. Affect is normal. Lungs are clear.  Skin is warm and well-perfused.  The patient is improved. He'll be discharged on oral Cipro and Flagyl for a total of 14 days with short interval follow-up with [place] in the office.

## 2015-08-10 NOTE — Discharge Summary (Signed)
Physician Discharge Summary  Patient ID: David Morgan MRN: [id number] DOB/AGE: 03-27-1925 81 y.o.  Admit date: 08/06/2015 Discharge date: 08/10/2015  Admission Diagnoses: Recurrent diverticulitis with microperforation of sigmoid colon.  Discharge Diagnoses:  Active Problems:   Diverticulitis large intestine   Discharged Condition: stable and improved  Hospital Course: The patient was admitted for bowel rest clear liquid diet intravenous antibiotics. His examination over the course of his hospitalization gradually improved from dated today. His diet was able to be advanced. Calcium regarding the need for interval colonoscopy in the consideration of sigmoid colectomy given that he's had 2 recurrences in a short period of time. He was in agreement with this plan and was discharged home in stable condition on the 23rd with oral antibiotics.   Discharge Exam: Blood pressure 131/61, pulse 85, temperature 98 F (36.7 C), temperature source Oral, resp. rate 20, height 5\' 10"  (1.778 m), weight 202 lb 11.2 oz (91.944 kg), SpO2 97 %. Soft nontender obese abdomen.  Disposition: 06-Home-Health Care Svc  Discharge Instructions    Call MD for:  persistant nausea and vomiting    Complete by:  As directed      Call MD for:  redness, tenderness, or signs of infection (pain, swelling, redness, odor or green/yellow discharge around incision site)    Complete by:  As directed      Call MD for:  severe uncontrolled pain    Complete by:  As directed      Call MD for:  temperature >100.4    Complete by:  As directed      Diet general    Complete by:  As directed   Low fiber diet instructions.     Discharge instructions    Complete by:  As directed   DISCHARGE INSTRUCTIONS TO PATIENT  REMINDER:  Carry a list of your medications and allergies with you at all times Call your pharmacy at least 1 week in advance to refill prescriptions Do not mix any prescribed pain medicine with alcohol Do  not drive any motor vehicles while taking pain medication. Take medications with food.  Do not retake a pain medication if you vomit after taking it.  Activity: no lifting more than 15 pounds until instructed by your doctor.   Dressing Care Instruction (if applicable):              Remove operative dressings in 48 hours.  May Shower-  Call office if any questions regarding this activity.  Dry Dressing as needed to operative site.  Drain care instructions provided to you in the hospital.   Follow-up appointments (date to return to physician): Call for appointment with Dr. Sherri Rad, MD at [phone] or [phone]  If need MD on call after hours and on weekends call Hospital operator at [phone] as ask to speak to Surgeon on call for [facility].  Call Surgeon if you have: Temperature greater than 100.4 Persistent nausea and vomiting Severe uncontrolled pain Redness, tenderness, or signs of infection (pain, swelling, redness, odor or green/yellow discharge around the site) Difficulty breathing, headache or visual disturbances Hives Persistent dizziness or light-headedness Extreme fatigue Any other questions or concerns you may have after discharge  In an emergency, call 911 or go to an Emergency Department at a nearby hospital  Diet:  Resume your usual diet.  Avoid spicy, greasy or heavy foods.  If you have nausea or vomiting, go back to liquids.  If you cannot keep liquids down, call your doctor.  Avoid alcohol consumption while on prescription pain medications. Good nutrition promotes healing. Increase fiber and fluids.     I understand and acknowledge receipt of the above instructions.                                                                                                                                        Patient or Guardian Signature                                                                    Date/Time                                                                                                                                         Physician's or R.N.'s Signature                                                                  Date/Time  The discharge instructions have been reviewed with the patient and/or Family Member/Parent/Guardian.  Patient and/or Family Member/Parent/Guardian signed and retained a printed copy.     Increase activity slowly    Complete by:  As directed             Medication List    TAKE these medications        amLODipine 2.5 MG tablet  Commonly known as:  NORVASC  Take 2.5 mg by mouth every morning.     atorvastatin 40 MG tablet  Commonly known as:  LIPITOR  Take 40 mg by mouth every evening.     carvedilol 3.125 MG tablet  Commonly known as:  COREG  Take 3.125 mg by mouth 2 (two) times daily with a meal.     ciprofloxacin 500 MG tablet  Commonly known as:  CIPRO  Take 1 tablet (500 mg total) by mouth 2 (two)  times daily.     FLAGYL 500 MG tablet  Generic drug:  metroNIDAZOLE  Take 1 tablet (500 mg total) by mouth 3 (three) times daily.     gabapentin 100 MG capsule  Commonly known as:  NEURONTIN  Take 100 mg by mouth 3 (three) times daily.     HYDROcodone-acetaminophen 5-325 MG tablet  Commonly known as:  NORCO  Take 1 tablet by mouth every 6 (six) hours as needed for moderate pain.     levothyroxine 150 MCG tablet  Commonly known as:  SYNTHROID, LEVOTHROID  Take 150 mcg by mouth every evening.     losartan 50 MG tablet  Commonly known as:  COZAAR  Take 50 mg by mouth daily.     ondansetron 4 MG tablet  Commonly known as:  ZOFRAN  Take 1 tablet (4 mg total) by mouth every 6 (six) hours as needed for nausea.     tamsulosin 0.4 MG Caps capsule  Commonly known as:  FLOMAX  Take 0.8 mg by mouth every evening.           Follow-up Information    Follow up with Sherri Rad, MD In 10 days.   Specialties:  Surgery, Radiology   Contact information:   [address] [place] [address] [phone]       Signed: Sherri Rad 08/10/2015, 1:02 PM

## 2015-08-20 DIAGNOSIS — E785 Hyperlipidemia, unspecified: Secondary | ICD-10-CM | POA: Diagnosis not present

## 2015-08-20 DIAGNOSIS — E039 Hypothyroidism, unspecified: Secondary | ICD-10-CM | POA: Diagnosis not present

## 2015-08-20 DIAGNOSIS — I10 Essential (primary) hypertension: Secondary | ICD-10-CM | POA: Diagnosis not present

## 2015-08-20 DIAGNOSIS — Z48815 Encounter for surgical aftercare following surgery on the digestive system: Secondary | ICD-10-CM | POA: Diagnosis not present

## 2015-08-20 DIAGNOSIS — H9190 Unspecified hearing loss, unspecified ear: Secondary | ICD-10-CM | POA: Diagnosis not present

## 2015-08-20 DIAGNOSIS — I739 Peripheral vascular disease, unspecified: Secondary | ICD-10-CM | POA: Diagnosis not present

## 2015-08-20 DIAGNOSIS — Z8551 Personal history of malignant neoplasm of bladder: Secondary | ICD-10-CM | POA: Diagnosis not present

## 2015-08-23 ENCOUNTER — Other Ambulatory Visit: Payer: Self-pay

## 2015-08-24 ENCOUNTER — Telehealth: Payer: Self-pay

## 2015-08-24 ENCOUNTER — Ambulatory Visit (INDEPENDENT_AMBULATORY_CARE_PROVIDER_SITE_OTHER): Payer: PPO | Admitting: Surgery

## 2015-08-24 ENCOUNTER — Encounter: Payer: Self-pay | Admitting: Surgery

## 2015-08-24 VITALS — BP 159/77 | HR 93 | Temp 97.6°F | Ht 70.0 in | Wt 204.8 lb

## 2015-08-24 DIAGNOSIS — K5780 Diverticulitis of intestine, part unspecified, with perforation and abscess without bleeding: Secondary | ICD-10-CM | POA: Diagnosis not present

## 2015-08-24 NOTE — Telephone Encounter (Signed)
ERROR

## 2015-08-24 NOTE — Patient Instructions (Signed)
The only things that we would like you to avoid is foods with small seeds, nuts, popcorn, and corn.   Please eat yogurt daily to help get your normal bacteria back into the colon.  We will see you back in 2 weeks to see how you are doing and to speak with the GI Physician to arrange a Colonoscopy.

## 2015-08-24 NOTE — Telephone Encounter (Signed)
Patient seen in the office today. Will need to speak with Dr. Allen Norris regarding a Colonoscopy since he has been hospitalized with Diverticulitis multiple times in a short period of time. The patient's appointment has been made for 09/05/15 at 0830 in the [facility] office. Spoke with Ginger and she explained that we can have Dr. Allen Norris see the patient on that day regarding this.  Call made to patient to give this information. No answer at this time. Left voicemail with the above information and asking for a return phone call to verify appointment information.

## 2015-08-24 NOTE — Progress Notes (Signed)
Subjective:     Patient ID: David Morgan, male   DOB: Jul 22, 1925, 82 y.o.   MRN: [id number]  HPI  82yr old male s/p Diverticulitis.  Patient states doing better than in the hospital.  He states pain almost gone but if pressing in on the area can still feel some soreness.  He states having diarrhea at this time with almost every meal and increased burping and flatulence.  He denies any mucous or blood in the stool. He states appetite returning but he has had some nausea still as well.   Past Medical History  Diagnosis Date  . Lesion of bladder   . History of bladder cancer   . Hypertension   . Hyperlipidemia   . Peripheral vascular disease ([place])   . Claudication ([place])   . Emphysema/COPD ([place])   . Mild asthma     NO INHALER  . Short of breath on exertion   . Hypothyroidism   . Urgency of urination   . Nocturia   . Arthritis   . Wears glasses   . History of TIA (transient ischemic attack)     2012--  NO RESIDUAL (PER SCAN HAD A PREVIOUS TIA BEFORE 2012)  . History of carotid artery stenosis   . Cancer ([place])   . Diverticulitis    Past Surgical History  Procedure Laterality Date  . Transurethral resection of bladder tumor  1990  . Total knee arthroplasty Right 2004  . Carotid endarterectomy Right 2005  . Carpal tunnel release Bilateral 2002  &  2007  . Right shoulder  surgery  2005  . Orif hip fracture Left 2008    RETAINED HARDWARE  . Umbilical hernia repair   2009  &  2011  . Inguinal hernia repair  YRS AGO  . Cataract extraction w/ intraocular lens  implant, bilateral    . Cystoscopy with biopsy N/A 12/26/2013    Procedure: CYSTOSCOPY WITH BLADDER BIOPSY;  Surgeon: Claybon Jabs, MD;  Location: [facility];  Service: Urology;  Laterality: N/A;  . Cystoscopy w/ retrogrades Bilateral 12/26/2013    Procedure: BILATERAL RETROGRADE PYELOGRAM;  Surgeon: Claybon Jabs, MD;  Location: [facility];  Service: Urology;  Laterality: Bilateral;  .  Laparoscopy  06/28/2015    Procedure: LAPAROSCOPY DIAGNOSTIC;  Surgeon: Clayburn Pert, MD;  Location: [facility];  Service: General;;  . Laparoscopic lysis of adhesions  06/28/2015    Procedure: LAPAROSCOPIC LYSIS OF ADHESIONS;  Surgeon: Clayburn Pert, MD;  Location: [facility];  Service: General;;   Family History  Problem Relation Age of Onset  . Hypertension Father    Social History   Social History  . Marital Status: Married    Spouse Name: N/A  . Number of Children: N/A  . Years of Education: N/A   Social History Main Topics  . Smoking status: Former Smoker -- 1.00 packs/day for 40 years    Types: Cigarettes    Quit date: 12/21/1993  . Smokeless tobacco: Never Used  . Alcohol Use: Yes     Comment: RARE  . Drug Use: No  . Sexual Activity: Not Asked   Other Topics Concern  . None   Social History Narrative    Current outpatient prescriptions:  .  amLODipine (NORVASC) 2.5 MG tablet, Take 2.5 mg by mouth every morning., Disp: , Rfl:  .  atorvastatin (LIPITOR) 40 MG tablet, Take 40 mg by mouth every evening., Disp: , Rfl:  .  carvedilol (COREG) 3.125 MG  tablet, Take 3.125 mg by mouth 2 (two) times daily with a meal., Disp: , Rfl:  .  ciprofloxacin (CIPRO) 500 MG tablet, Take 1 tablet (500 mg total) by mouth 2 (two) times daily., Disp: 20 tablet, Rfl: 0 .  FLAGYL 500 MG tablet, Take 1 tablet (500 mg total) by mouth 3 (three) times daily., Disp: 30 tablet, Rfl: 1 .  gabapentin (NEURONTIN) 100 MG capsule, Take 100 mg by mouth 3 (three) times daily. , Disp: , Rfl:  .  HYDROcodone-acetaminophen (NORCO) 5-325 MG tablet, Take 1 tablet by mouth every 6 (six) hours as needed for moderate pain., Disp: 30 tablet, Rfl: 0 .  levothyroxine (SYNTHROID, LEVOTHROID) 150 MCG tablet, Take 150 mcg by mouth every evening., Disp: , Rfl:  .  losartan (COZAAR) 50 MG tablet, Take 50 mg by mouth daily. , Disp: , Rfl:  .  Multiple Vitamin (MULTI-VITAMINS) TABS, Take 1 tablet by mouth daily., Disp: ,  Rfl:  .  ondansetron (ZOFRAN) 4 MG tablet, Take 1 tablet (4 mg total) by mouth every 6 (six) hours as needed for nausea., Disp: 20 tablet, Rfl: 0 .  tamsulosin (FLOMAX) 0.4 MG CAPS capsule, Take 0.8 mg by mouth every evening., Disp: , Rfl:  Allergies  Allergen Reactions  . Adhesive [Tape] Rash  . Augmentin [Amoxicillin-Pot Clavulanate] Itching and Rash    Filed Vitals:   08/24/15 1105  BP: 159/77  Pulse: 93  Temp: 97.6 F (36.4 C)      Review of Systems  Constitutional: Positive for activity change, appetite change and fatigue. Negative for fever, chills and unexpected weight change.  HENT: Negative for congestion and sore throat.   Respiratory: Negative for cough, chest tightness, shortness of breath and wheezing.   Cardiovascular: Negative for chest pain, palpitations and leg swelling.  Gastrointestinal: Positive for nausea, abdominal pain and diarrhea. Negative for vomiting, constipation, blood in stool, abdominal distention and anal bleeding.  Genitourinary: Negative for dysuria and urgency.  Musculoskeletal: Negative for back pain and joint swelling.  Skin: Negative for color change, pallor, rash and wound.  Neurological: Negative for dizziness and weakness.  Hematological: Negative for adenopathy. Does not bruise/bleed easily.  Psychiatric/Behavioral: Negative for agitation. The patient is not nervous/anxious.   All other systems reviewed and are negative.      Objective:   Physical Exam  Constitutional: He is oriented to person, place, and time. He appears well-developed and well-nourished. No distress.  HENT:  Head: Normocephalic and atraumatic.  Right Ear: External ear normal.  Left Ear: External ear normal.  Nose: Nose normal.  Mouth/Throat: Oropharynx is clear and moist. No oropharyngeal exudate.  Eyes: Conjunctivae are normal. Pupils are equal, round, and reactive to light. No scleral icterus.  Neck: Normal range of motion. Neck supple. No tracheal deviation  present.  Cardiovascular: Normal rate, regular rhythm, normal heart sounds and intact distal pulses.  Exam reveals no gallop and no friction rub.   No murmur heard. Pulmonary/Chest: Effort normal and breath sounds normal. No respiratory distress. He has no wheezes.  Abdominal: Soft. Bowel sounds are normal. He exhibits no distension and no mass. There is tenderness. There is no rebound and no guarding.  Mild tenderness in LLQ  Lymphadenopathy:    He has no cervical adenopathy.  Neurological: He is alert and oriented to person, place, and time. No cranial nerve deficit.  Skin: Skin is warm and dry. No erythema. No pallor.  Psychiatric: He has a normal mood and affect. His behavior is normal. Judgment  and thought content normal.  Vitals reviewed.      Assessment:     82 yr old s/p Acute diverticulitis    Plan:     Seems to be healing well, instructed to start a high fiber diet and start taking in more yogurt.  Will have him come back in a couple weeks to ensure improving before getting colonoscopy.

## 2015-08-30 DIAGNOSIS — I10 Essential (primary) hypertension: Secondary | ICD-10-CM | POA: Diagnosis not present

## 2015-08-30 DIAGNOSIS — E039 Hypothyroidism, unspecified: Secondary | ICD-10-CM | POA: Diagnosis not present

## 2015-08-30 DIAGNOSIS — Z48815 Encounter for surgical aftercare following surgery on the digestive system: Secondary | ICD-10-CM | POA: Diagnosis not present

## 2015-08-30 DIAGNOSIS — H9190 Unspecified hearing loss, unspecified ear: Secondary | ICD-10-CM | POA: Diagnosis not present

## 2015-08-30 DIAGNOSIS — I739 Peripheral vascular disease, unspecified: Secondary | ICD-10-CM | POA: Diagnosis not present

## 2015-08-30 DIAGNOSIS — E785 Hyperlipidemia, unspecified: Secondary | ICD-10-CM | POA: Diagnosis not present

## 2015-08-30 DIAGNOSIS — Z8551 Personal history of malignant neoplasm of bladder: Secondary | ICD-10-CM | POA: Diagnosis not present

## 2015-09-03 ENCOUNTER — Encounter
Admission: EM | Disposition: A | Discharge status: Home / Self Care | Payer: Self-pay | Source: Home / Self Care | Attending: Surgery

## 2015-09-03 ENCOUNTER — Emergency Department: Payer: PPO

## 2015-09-03 ENCOUNTER — Inpatient Hospital Stay: Payer: PPO | Admitting: Registered Nurse

## 2015-09-03 ENCOUNTER — Inpatient Hospital Stay
Admission: EM | Admit: 2015-09-03 | Discharge: 2015-09-06 | DRG: 355 | Disposition: A | Discharge status: Home / Self Care | Payer: PPO | Attending: Surgery | Admitting: Surgery

## 2015-09-03 DIAGNOSIS — Z8551 Personal history of malignant neoplasm of bladder: Secondary | ICD-10-CM | POA: Diagnosis not present

## 2015-09-03 DIAGNOSIS — Z91048 Other nonmedicinal substance allergy status: Secondary | ICD-10-CM | POA: Diagnosis not present

## 2015-09-03 DIAGNOSIS — E039 Hypothyroidism, unspecified: Secondary | ICD-10-CM | POA: Diagnosis not present

## 2015-09-03 DIAGNOSIS — K436 Other and unspecified ventral hernia with obstruction, without gangrene: Secondary | ICD-10-CM | POA: Diagnosis not present

## 2015-09-03 DIAGNOSIS — K420 Umbilical hernia with obstruction, without gangrene: Secondary | ICD-10-CM | POA: Diagnosis present

## 2015-09-03 DIAGNOSIS — J45909 Unspecified asthma, uncomplicated: Secondary | ICD-10-CM | POA: Diagnosis not present

## 2015-09-03 DIAGNOSIS — I739 Peripheral vascular disease, unspecified: Secondary | ICD-10-CM | POA: Diagnosis present

## 2015-09-03 DIAGNOSIS — Z87891 Personal history of nicotine dependence: Secondary | ICD-10-CM

## 2015-09-03 DIAGNOSIS — R351 Nocturia: Secondary | ICD-10-CM | POA: Diagnosis not present

## 2015-09-03 DIAGNOSIS — Z880 Allergy status to penicillin: Secondary | ICD-10-CM | POA: Diagnosis not present

## 2015-09-03 DIAGNOSIS — R3915 Urgency of urination: Secondary | ICD-10-CM | POA: Diagnosis present

## 2015-09-03 DIAGNOSIS — K5660 Unspecified intestinal obstruction: Secondary | ICD-10-CM | POA: Diagnosis not present

## 2015-09-03 DIAGNOSIS — E785 Hyperlipidemia, unspecified: Secondary | ICD-10-CM | POA: Diagnosis present

## 2015-09-03 DIAGNOSIS — I10 Essential (primary) hypertension: Secondary | ICD-10-CM | POA: Diagnosis present

## 2015-09-03 DIAGNOSIS — Z8673 Personal history of transient ischemic attack (TIA), and cerebral infarction without residual deficits: Secondary | ICD-10-CM

## 2015-09-03 DIAGNOSIS — K429 Umbilical hernia without obstruction or gangrene: Secondary | ICD-10-CM | POA: Diagnosis not present

## 2015-09-03 DIAGNOSIS — R109 Unspecified abdominal pain: Secondary | ICD-10-CM | POA: Diagnosis not present

## 2015-09-03 DIAGNOSIS — K5669 Other intestinal obstruction: Secondary | ICD-10-CM

## 2015-09-03 DIAGNOSIS — K56609 Unspecified intestinal obstruction, unspecified as to partial versus complete obstruction: Secondary | ICD-10-CM

## 2015-09-03 DIAGNOSIS — R0602 Shortness of breath: Secondary | ICD-10-CM

## 2015-09-03 DIAGNOSIS — J439 Emphysema, unspecified: Secondary | ICD-10-CM | POA: Diagnosis present

## 2015-09-03 HISTORY — PX: LAPAROTOMY: SHX154

## 2015-09-03 LAB — CBC WITH DIFFERENTIAL/PLATELET
BASOS ABS: 0 10*3/uL (ref 0–0.1)
BASOS PCT: 0 %
EOS ABS: 0 10*3/uL (ref 0–0.7)
Eosinophils Relative: 0 %
HCT: 38.5 % — ABNORMAL LOW (ref 40.0–52.0)
Hemoglobin: 12.8 g/dL — ABNORMAL LOW (ref 13.0–18.0)
Lymphocytes Relative: 13 %
Lymphs Abs: 1.2 10*3/uL (ref 1.0–3.6)
MCH: 27.3 pg (ref 26.0–34.0)
MCHC: 33.1 g/dL (ref 32.0–36.0)
MCV: 82.4 fL (ref 80.0–100.0)
MONO ABS: 0.6 10*3/uL (ref 0.2–1.0)
MONOS PCT: 6 %
NEUTROS PCT: 81 %
Neutro Abs: 7.8 10*3/uL — ABNORMAL HIGH (ref 1.4–6.5)
Platelets: 217 10*3/uL (ref 150–440)
RBC: 4.67 MIL/uL (ref 4.40–5.90)
RDW: 15.3 % — AB (ref 11.5–14.5)
WBC: 9.6 10*3/uL (ref 3.8–10.6)

## 2015-09-03 LAB — COMPREHENSIVE METABOLIC PANEL
ALBUMIN: 3.7 g/dL (ref 3.5–5.0)
ALK PHOS: 67 U/L (ref 38–126)
ALT: 22 U/L (ref 17–63)
ANION GAP: 9 (ref 5–15)
AST: 31 U/L (ref 15–41)
BILIRUBIN TOTAL: 1.5 mg/dL — AB (ref 0.3–1.2)
BUN: 17 mg/dL (ref 6–20)
CALCIUM: 9.5 mg/dL (ref 8.9–10.3)
CO2: 32 mmol/L (ref 22–32)
Chloride: 97 mmol/L — ABNORMAL LOW (ref 101–111)
Creatinine, Ser: 0.85 mg/dL (ref 0.61–1.24)
GFR calc Af Amer: >60 mL/min (ref >60)
Glucose, Bld: 172 mg/dL — ABNORMAL HIGH (ref 65–99)
Potassium: 4.2 mmol/L (ref 3.5–5.1)
Sodium: 138 mmol/L (ref 135–145)
TOTAL PROTEIN: 8.2 g/dL — AB (ref 6.5–8.1)

## 2015-09-03 LAB — URINALYSIS COMPLETE WITH MICROSCOPIC (ARMC ONLY)
BILIRUBIN URINE: NEGATIVE
Bacteria, UA: NONE SEEN
GLUCOSE, UA: NEGATIVE mg/dL
Hgb urine dipstick: NEGATIVE
Leukocytes, UA: NEGATIVE
Nitrite: NEGATIVE
PH: 9 — AB (ref 5.0–8.0)
PROTEIN: NEGATIVE mg/dL
RBC / HPF: NONE SEEN RBC/hpf (ref 0–5)
Specific Gravity, Urine: 1.019 (ref 1.005–1.030)

## 2015-09-03 LAB — TROPONIN I: Troponin I: <0.03 ng/mL (ref <0.031)

## 2015-09-03 LAB — LIPASE, BLOOD: LIPASE: 38 U/L (ref 11–51)

## 2015-09-03 LAB — LACTIC ACID, PLASMA: LACTIC ACID, VENOUS: 2.1 mmol/L — AB (ref 0.5–2.0)

## 2015-09-03 LAB — PROTIME-INR
INR: 1.01
PROTHROMBIN TIME: 13.5 s (ref 11.4–15.0)

## 2015-09-03 SURGERY — LAPAROTOMY, EXPLORATORY
Anesthesia: General | Wound class: Clean

## 2015-09-03 MED ORDER — BUPIVACAINE-EPINEPHRINE (PF) 0.25% -1:200000 IJ SOLN
INTRAMUSCULAR | Status: DC | PRN
  Administered 2015-09-03: 30 mL via PERINEURAL

## 2015-09-03 MED ORDER — DEXTROSE 5 % IV SOLN
2.0000 g | Freq: Once | INTRAVENOUS | Status: AC
  Administered 2015-09-03: 2 g via INTRAVENOUS
  Filled 2015-09-03: qty 2

## 2015-09-03 MED ORDER — METOCLOPRAMIDE HCL 5 MG/ML IJ SOLN
10.0000 mg | Freq: Once | INTRAMUSCULAR | Status: AC
  Administered 2015-09-03: 10 mg via INTRAVENOUS
  Filled 2015-09-03: qty 2

## 2015-09-03 MED ORDER — SODIUM CHLORIDE 0.9 % IV SOLN
10000.0000 ug | INTRAVENOUS | Status: DC | PRN
  Administered 2015-09-03: 50 ug/min via INTRAVENOUS

## 2015-09-03 MED ORDER — LIDOCAINE HCL (CARDIAC) 20 MG/ML IV SOLN
INTRAVENOUS | Status: DC | PRN
  Administered 2015-09-03: 100 mg via INTRAVENOUS

## 2015-09-03 MED ORDER — CARVEDILOL 6.25 MG PO TABS
3.1250 mg | ORAL_TABLET | Freq: Two times a day (BID) | ORAL | Status: DC
  Administered 2015-09-04 – 2015-09-06 (×5): 3.125 mg via ORAL
  Filled 2015-09-03: qty 1
  Filled 2015-09-03: qty 2
  Filled 2015-09-03 (×2): qty 1

## 2015-09-03 MED ORDER — MORPHINE SULFATE (PF) 2 MG/ML IV SOLN
1.0000 mg | INTRAVENOUS | Status: DC | PRN
  Administered 2015-09-03 – 2015-09-05 (×2): 1 mg via INTRAVENOUS
  Filled 2015-09-03 (×2): qty 1

## 2015-09-03 MED ORDER — PROPOFOL 10 MG/ML IV BOLUS
INTRAVENOUS | Status: DC | PRN
  Administered 2015-09-03: 150 mg via INTRAVENOUS

## 2015-09-03 MED ORDER — HYDROMORPHONE HCL 1 MG/ML IJ SOLN: 0.2500 mg | INTRAMUSCULAR | Status: DC | PRN

## 2015-09-03 MED ORDER — IOHEXOL 300 MG/ML  SOLN
100.0000 mL | Freq: Once | INTRAMUSCULAR | Status: AC | PRN
  Administered 2015-09-03: 100 mL via INTRAVENOUS

## 2015-09-03 MED ORDER — SUGAMMADEX SODIUM 200 MG/2ML IV SOLN
INTRAVENOUS | Status: DC | PRN
  Administered 2015-09-03: 186.8 mg via INTRAVENOUS

## 2015-09-03 MED ORDER — SODIUM CHLORIDE 0.9 % IR SOLN
Status: DC | PRN
  Administered 2015-09-03: 300 mL

## 2015-09-03 MED ORDER — LOSARTAN POTASSIUM 25 MG PO TABS
50.0000 mg | ORAL_TABLET | Freq: Every day | ORAL | Status: DC
  Administered 2015-09-04 – 2015-09-06 (×3): 50 mg via ORAL
  Filled 2015-09-03 (×3): qty 2

## 2015-09-03 MED ORDER — ONDANSETRON HCL 4 MG/2ML IJ SOLN
INTRAMUSCULAR | Status: DC | PRN
  Administered 2015-09-03: 4 mg via INTRAVENOUS

## 2015-09-03 MED ORDER — DEXAMETHASONE SODIUM PHOSPHATE 4 MG/ML IJ SOLN: 8.0000 mg | Freq: Once | INTRAMUSCULAR | Status: DC | PRN

## 2015-09-03 MED ORDER — SODIUM CHLORIDE 0.9 % IV BOLUS (SEPSIS)
500.0000 mL | Freq: Once | INTRAVENOUS | Status: AC
  Administered 2015-09-03: 500 mL via INTRAVENOUS

## 2015-09-03 MED ORDER — SUCCINYLCHOLINE CHLORIDE 20 MG/ML IJ SOLN
INTRAMUSCULAR | Status: DC | PRN
  Administered 2015-09-03: 120 mg via INTRAVENOUS

## 2015-09-03 MED ORDER — ONDANSETRON 4 MG PO TBDP: 4.0000 mg | ORAL_TABLET | Freq: Four times a day (QID) | ORAL | Status: DC | PRN

## 2015-09-03 MED ORDER — IOHEXOL 240 MG/ML SOLN
25.0000 mL | Freq: Once | INTRAMUSCULAR | Status: AC | PRN
  Administered 2015-09-03: 25 mL via ORAL

## 2015-09-03 MED ORDER — PANTOPRAZOLE SODIUM 40 MG IV SOLR
40.0000 mg | Freq: Every day | INTRAVENOUS | Status: DC
  Administered 2015-09-03 – 2015-09-05 (×3): 40 mg via INTRAVENOUS
  Filled 2015-09-03 (×3): qty 40

## 2015-09-03 MED ORDER — KETOROLAC TROMETHAMINE 30 MG/ML IJ SOLN
INTRAMUSCULAR | Status: AC
Start: 2015-09-03 — End: 2015-09-03
  Administered 2015-09-03: 15 mg
  Filled 2015-09-03: qty 1

## 2015-09-03 MED ORDER — LACTATED RINGERS IV SOLN
INTRAVENOUS | Status: DC | PRN
  Administered 2015-09-03: 16:00:00 via INTRAVENOUS

## 2015-09-03 MED ORDER — HYDRALAZINE HCL 20 MG/ML IJ SOLN: 10.0000 mg | INTRAMUSCULAR | Status: DC | PRN

## 2015-09-03 MED ORDER — MORPHINE SULFATE (PF) 4 MG/ML IV SOLN
4.0000 mg | Freq: Once | INTRAVENOUS | Status: AC
  Administered 2015-09-03: 4 mg via INTRAVENOUS
  Filled 2015-09-03: qty 1

## 2015-09-03 MED ORDER — EPHEDRINE SULFATE 50 MG/ML IJ SOLN
INTRAMUSCULAR | Status: DC | PRN
  Administered 2015-09-03 (×2): 10 mg via INTRAVENOUS

## 2015-09-03 MED ORDER — FENTANYL CITRATE (PF) 100 MCG/2ML IJ SOLN
INTRAMUSCULAR | Status: DC | PRN
  Administered 2015-09-03: 50 ug via INTRAVENOUS
  Administered 2015-09-03: 100 ug via INTRAVENOUS

## 2015-09-03 MED ORDER — PHENYLEPHRINE HCL 10 MG/ML IJ SOLN
INTRAMUSCULAR | Status: DC | PRN
  Administered 2015-09-03 (×2): 200 ug via INTRAVENOUS
  Administered 2015-09-03: 100 ug via INTRAVENOUS

## 2015-09-03 MED ORDER — AMLODIPINE BESYLATE 5 MG PO TABS
2.5000 mg | ORAL_TABLET | Freq: Every morning | ORAL | Status: DC
  Administered 2015-09-04 – 2015-09-06 (×3): 2.5 mg via ORAL
  Filled 2015-09-03 (×3): qty 1

## 2015-09-03 MED ORDER — DEXAMETHASONE SODIUM PHOSPHATE 10 MG/ML IJ SOLN
INTRAMUSCULAR | Status: DC | PRN
  Administered 2015-09-03: 10 mg via INTRAVENOUS

## 2015-09-03 MED ORDER — LEVOTHYROXINE SODIUM 150 MCG PO TABS
150.0000 ug | ORAL_TABLET | Freq: Every evening | ORAL | Status: DC
  Administered 2015-09-04 – 2015-09-05 (×2): 150 ug via ORAL
  Filled 2015-09-03 (×2): qty 1

## 2015-09-03 MED ORDER — TAMSULOSIN HCL 0.4 MG PO CAPS
0.8000 mg | ORAL_CAPSULE | Freq: Every evening | ORAL | Status: DC
  Administered 2015-09-04 – 2015-09-05 (×2): 0.8 mg via ORAL
  Filled 2015-09-03 (×2): qty 2

## 2015-09-03 MED ORDER — ACETAMINOPHEN 500 MG PO TABS
1000.0000 mg | ORAL_TABLET | Freq: Four times a day (QID) | ORAL | Status: DC
  Administered 2015-09-03 – 2015-09-06 (×10): 1000 mg via ORAL
  Filled 2015-09-03 (×12): qty 2

## 2015-09-03 MED ORDER — ENOXAPARIN SODIUM 40 MG/0.4ML ~~LOC~~ SOLN
40.0000 mg | SUBCUTANEOUS | Status: DC
  Administered 2015-09-04 – 2015-09-06 (×3): 40 mg via SUBCUTANEOUS
  Filled 2015-09-03 (×3): qty 0.4

## 2015-09-03 MED ORDER — DEXTROSE-NACL 5-0.9 % IV SOLN
INTRAVENOUS | Status: DC
  Administered 2015-09-03 – 2015-09-04 (×2): via INTRAVENOUS

## 2015-09-03 MED ORDER — ROCURONIUM BROMIDE 100 MG/10ML IV SOLN
INTRAVENOUS | Status: DC | PRN
  Administered 2015-09-03: 30 mg via INTRAVENOUS
  Administered 2015-09-03: 20 mg via INTRAVENOUS

## 2015-09-03 MED ORDER — ONDANSETRON HCL 4 MG/2ML IJ SOLN
4.0000 mg | Freq: Once | INTRAMUSCULAR | Status: AC
  Administered 2015-09-03: 4 mg via INTRAVENOUS
  Filled 2015-09-03: qty 2

## 2015-09-03 MED ORDER — BUPIVACAINE-EPINEPHRINE (PF) 0.25% -1:200000 IJ SOLN
INTRAMUSCULAR | Status: AC
  Filled 2015-09-03: qty 30

## 2015-09-03 MED ORDER — KETOROLAC TROMETHAMINE 15 MG/ML IJ SOLN
15.0000 mg | Freq: Four times a day (QID) | INTRAMUSCULAR | Status: DC
  Administered 2015-09-04 – 2015-09-06 (×10): 15 mg via INTRAVENOUS
  Filled 2015-09-03 (×10): qty 1

## 2015-09-03 MED ORDER — ONDANSETRON HCL 4 MG/2ML IJ SOLN: 4.0000 mg | Freq: Four times a day (QID) | INTRAMUSCULAR | Status: DC | PRN

## 2015-09-03 MED ORDER — MIDAZOLAM HCL 2 MG/2ML IJ SOLN
INTRAMUSCULAR | Status: DC | PRN
  Administered 2015-09-03: 2 mg via INTRAVENOUS

## 2015-09-03 SURGICAL SUPPLY — 30 items
3-0 MONOCRYL ×9 IMPLANT
4-0 MONOCRYL ×3 IMPLANT
CANISTER SUCT 1200ML W/VALVE (MISCELLANEOUS) ×3 IMPLANT
CATH TRAY 16F METER LATEX (MISCELLANEOUS) ×3 IMPLANT
CHLORAPREP W/TINT 26ML (MISCELLANEOUS) ×3 IMPLANT
DRAPE LAPAROTOMY 100X77 ABD (DRAPES) ×3 IMPLANT
ELECT CAUTERY BLADE 6.4 (BLADE) ×3 IMPLANT
GLOVE BIO SURGEON STRL SZ8 (GLOVE) ×3 IMPLANT
GLOVE BIOGEL PI IND STRL 7.0 (GLOVE) ×1 IMPLANT
GLOVE BIOGEL PI INDICATOR 7.0 (GLOVE) ×2
GLOVE INDICATOR 8.0 STRL GRN (GLOVE) ×3 IMPLANT
GLOVE PI ORTHOPRO 6.5 (GLOVE) ×4
GLOVE PI ORTHOPRO STRL 6.5 (GLOVE) ×2 IMPLANT
GOWN STRL REUS W/ TWL LRG LVL3 (GOWN DISPOSABLE) ×5 IMPLANT
GOWN STRL REUS W/TWL LRG LVL3 (GOWN DISPOSABLE) ×10
KIT RM TURNOVER STRD PROC AR (KITS) ×3 IMPLANT
LABEL OR SOLS (LABEL) ×3 IMPLANT
LIGASURE IMPACT 36 18CM CVD LR (INSTRUMENTS) IMPLANT
MESH VENTRALEX ST 8CM LRG (Mesh General) ×3 IMPLANT
NS IRRIG 1000ML POUR BTL (IV SOLUTION) ×3 IMPLANT
PACK BASIN MAJOR ARMC (MISCELLANEOUS) ×3 IMPLANT
PACK COLON CLEAN CLOSURE (MISCELLANEOUS) ×3 IMPLANT
PAD GROUND ADULT SPLIT (MISCELLANEOUS) ×3 IMPLANT
STAPLER SKIN PROX 35W (STAPLE) ×3 IMPLANT
SUT PDS AB 1 TP1 96 (SUTURE) IMPLANT
SUT PROLENE 2 0 SH DA (SUTURE) ×15 IMPLANT
SUT SILK 2 0 (SUTURE) ×2
SUT SILK 2-0 30XBRD TIE 12 (SUTURE) ×1 IMPLANT
SUT SILK 3-0 (SUTURE) ×3 IMPLANT
SUT VICRYL+ 3-0 144IN (SUTURE) IMPLANT

## 2015-09-03 NOTE — H&P (Signed)
David Morgan is an 82 y.o. male.   Chief Complaint: abdominal pain, nausea and vomiting HPI: 82yrold male well known to surgical service just recently getting over bout of diverticulitis in late November.  He was seen in the office a little over a week ago and had been having some nausea and vomiting.  He states that yesterday he had abdominal pain over mid abdomen off and on and then last night around 1am started projectile vomiting and had severe pain around the umbilicus as well.  Patient states nothing made it better and so he came to the ED.  He vomited a couple more times here in the ED and Dr. MBurlene Arntattempted to reduce hernia in the area.  Patient states that he feel somewhat better but still having the pain.  He denies any sob, chest pain, fever, chills, diarrhea or dysuria.    Past Medical History  Diagnosis Date  . Lesion of bladder   . History of bladder cancer   . Hypertension   . Hyperlipidemia   . Peripheral vascular disease ([place]   . Claudication ([place]   . Emphysema/COPD ([place]   . Mild asthma     NO INHALER  . Short of breath on exertion   . Hypothyroidism   . Urgency of urination   . Nocturia   . Arthritis   . Wears glasses   . History of TIA (transient ischemic attack)     2012--  NO RESIDUAL (PER SCAN HAD A PREVIOUS TIA BEFORE 2012)  . History of carotid artery stenosis   . [place]   . Diverticulitis     Past Surgical History  Procedure Laterality Date  . Transurethral resection of bladder tumor  1990  . Total knee arthroplasty Right 2004  . Carotid endarterectomy Right 2005  . Carpal tunnel release Bilateral 2002  &  2007  . Right shoulder  surgery  2005  . Orif hip fracture Left 2008    RETAINED HARDWARE  . Umbilical hernia repair   2009  &  2011  . Inguinal hernia repair  YRS AGO  . Cataract extraction w/ intraocular lens  implant, bilateral    . Cystoscopy with biopsy N/A 12/26/2013    Procedure: CYSTOSCOPY WITH BLADDER BIOPSY;  Surgeon:  MClaybon Jabs MD;  [facility]  Service: Urology;  Laterality: N/A;  . Cystoscopy w/ retrogrades Bilateral 12/26/2013    Procedure: BILATERAL RETROGRADE PYELOGRAM;  Surgeon: MClaybon Jabs MD;  [facility]  Service: Urology;  Laterality: Bilateral;  . Laparoscopy  06/28/2015    Procedure: LAPAROSCOPY DIAGNOSTIC;  Surgeon: CClayburn Pert MD;  Location: [facility];  Service: General;;  . Laparoscopic lysis of adhesions  06/28/2015    Procedure: LAPAROSCOPIC LYSIS OF ADHESIONS;  Surgeon: CClayburn Pert MD;  Location: [facility];  Service: General;;    Family History  Problem Relation Age of Onset  . Hypertension Father    Social History:  reports that he quit smoking about 21 years ago. His smoking use included Cigarettes. He has a 40 pack-year smoking history. He has never used smokeless tobacco. He reports that he drinks alcohol. He reports that he does not use illicit drugs.  Allergies:  Allergies  Allergen Reactions  . Adhesive [Tape] Rash  . Augmentin [Amoxicillin-Pot Clavulanate] Itching, Rash and Other (See Comments)    Has patient had a PCN reaction causing immediate rash, facial/tongue/throat swelling, SOB or lightheadedness with hypotension: No Has patient had  a PCN reaction causing severe rash involving mucus membranes or skin necrosis: No Has patient had a PCN reaction that required hospitalization No Has patient had a PCN reaction occurring within the last 10 years: No If all of the above answers are "NO", then may proceed with Cephalosporin use.     Medications Prior to Admission  Medication Sig Dispense Refill  . amLODipine (NORVASC) 2.5 MG tablet Take 2.5 mg by mouth every morning.    [name] atorvastatin (LIPITOR) 40 MG tablet Take 40 mg by mouth every evening.    . carvedilol (COREG) 3.125 MG tablet Take 3.125 mg by mouth 2 (two) times daily with a meal.    . gabapentin (NEURONTIN) 100 MG capsule Take 100 mg by mouth 3  (three) times daily.     [name] levothyroxine (SYNTHROID, LEVOTHROID) 150 MCG tablet Take 150 mcg by mouth every evening.    [name] losartan (COZAAR) 50 MG tablet Take 50 mg by mouth daily.     . Multiple Vitamin (MULTI-VITAMINS) TABS Take 1 tablet by mouth daily.    . tamsulosin (FLOMAX) 0.4 MG CAPS capsule Take 0.8 mg by mouth every evening.    . ciprofloxacin (CIPRO) 500 MG tablet Take 1 tablet (500 mg total) by mouth 2 (two) times daily. (Patient not taking: Reported on 09/03/2015) 20 tablet 0  . FLAGYL 500 MG tablet Take 1 tablet (500 mg total) by mouth 3 (three) times daily. (Patient not taking: Reported on 09/03/2015) 30 tablet 1  . HYDROcodone-acetaminophen (NORCO) 5-325 MG tablet Take 1 tablet by mouth every 6 (six) hours as needed for moderate pain. (Patient not taking: Reported on 09/03/2015) 30 tablet 0  . ondansetron (ZOFRAN) 4 MG tablet Take 1 tablet (4 mg total) by mouth every 6 (six) hours as needed for nausea. (Patient not taking: Reported on 09/03/2015) 20 tablet 0    Results for orders placed or performed during the hospital encounter of 09/03/15 (from the past 48 hour(s))  Lactic acid, plasma     Status: Abnormal   Collection Time: 09/03/15  4:59 AM  Result Value Ref Range   Lactic Acid, Venous 2.1 (HH) 0.5 - 2.0 mmol/L    Comment: CRITICAL RESULT CALLED TO, READ BACK BY AND VERIFIED WITH LAURIE ALLEN AT 0530 09/03/15.PMH  Troponin I     Status: None   Collection Time: 09/03/15  4:59 AM  Result Value Ref Range   Troponin I <0.03 <0.031 ng/mL    Comment:        NO INDICATION OF MYOCARDIAL INJURY.   Comprehensive metabolic panel     Status: Abnormal   Collection Time: 09/03/15  4:59 AM  Result Value Ref Range   Sodium 138 135 - 145 mmol/L   Potassium 4.2 3.5 - 5.1 mmol/L    Comment: HEMOLYSIS AT THIS LEVEL MAY AFFECT RESULT   Chloride 97 (L) 101 - 111 mmol/L   CO2 32 22 - 32 mmol/L   Glucose, Bld 172 (H) 65 - 99 mg/dL   BUN 17 6 - 20 mg/dL   Creatinine, Ser 0.85 0.61 - 1.24  mg/dL   Calcium 9.5 8.9 - 10.3 mg/dL   Total Protein 8.2 (H) 6.5 - 8.1 g/dL   Albumin 3.7 3.5 - 5.0 g/dL   AST 31 15 - 41 U/L    Comment: HEMOLYSIS AT THIS LEVEL MAY AFFECT RESULT   ALT 22 17 - 63 U/L   Alkaline Phosphatase 67 38 - 126 U/L   Total Bilirubin 1.5 (H) 0.3 -  1.2 mg/dL    Comment: HEMOLYSIS AT THIS LEVEL MAY AFFECT RESULT   GFR calc non Af Amer >60 >60 mL/min   GFR calc Af Amer >60 >60 mL/min    Comment: (NOTE) The eGFR has been calculated using the CKD EPI equation. This calculation has not been validated in all clinical situations. eGFR's persistently <60 mL/min signify possible Chronic Kidney Disease.    Anion gap 9 5 - 15  Lipase, blood     Status: None   Collection Time: 09/03/15  4:59 AM  Result Value Ref Range   Lipase 38 11 - 51 U/L  CBC with Differential     Status: Abnormal   Collection Time: 09/03/15  4:59 AM  Result Value Ref Range   WBC 9.6 3.8 - 10.6 K/uL   RBC 4.67 4.40 - 5.90 MIL/uL   Hemoglobin 12.8 (L) 13.0 - 18.0 g/dL   HCT 38.5 (L) 40.0 - 52.0 %   MCV 82.4 80.0 - 100.0 fL   MCH 27.3 26.0 - 34.0 pg   MCHC 33.1 32.0 - 36.0 g/dL   RDW 15.3 (H) 11.5 - 14.5 %   Platelets 217 150 - 440 K/uL   Neutrophils Relative % 81 %   Neutro Abs 7.8 (H) 1.4 - 6.5 K/uL   Lymphocytes Relative 13 %   Lymphs Abs 1.2 1.0 - 3.6 K/uL   Monocytes Relative 6 %   Monocytes Absolute 0.6 0.2 - 1.0 K/uL   Eosinophils Relative 0 %   Eosinophils Absolute 0.0 0 - 0.7 K/uL   Basophils Relative 0 %   Basophils Absolute 0.0 0 - 0.1 K/uL  Protime-INR     Status: None   Collection Time: 09/03/15  6:40 AM  Result Value Ref Range   Prothrombin Time 13.5 11.4 - 15.0 seconds   INR 1.01   Urinalysis complete, with microscopic     Status: Abnormal   Collection Time: 09/03/15  6:54 AM  Result Value Ref Range   Color, Urine AMBER (A) YELLOW   APPearance CLEAR (A) CLEAR   Glucose, UA NEGATIVE NEGATIVE mg/dL   Bilirubin Urine NEGATIVE NEGATIVE   Ketones, ur TRACE (A) NEGATIVE  mg/dL   Specific Gravity, Urine 1.019 1.005 - 1.030   Hgb urine dipstick NEGATIVE NEGATIVE   pH 9.0 (H) 5.0 - 8.0   Protein, ur NEGATIVE NEGATIVE mg/dL   Nitrite NEGATIVE NEGATIVE   Leukocytes, UA NEGATIVE NEGATIVE   RBC / HPF NONE SEEN 0 - 5 RBC/hpf   WBC, UA 0-5 0 - 5 WBC/hpf   Bacteria, UA NONE SEEN NONE SEEN   Squamous Epithelial / LPF 0-5 (A) NONE SEEN   Ct Abdomen Pelvis W Contrast  09/03/2015  CLINICAL DATA:  82 year old male with history of hernia repair as well as history of diverticulitis. Abdominal pain. EXAM: CT ABDOMEN AND PELVIS WITH CONTRAST TECHNIQUE: Multidetector CT imaging of the abdomen and pelvis was performed using the standard protocol following bolus administration of intravenous contrast. CONTRAST:  165m OMNIPAQUE IOHEXOL 300 MG/ML  SOLN COMPARISON:  CT dated 08/06/2015 and 06/28/2015 FINDINGS: The visualized lung bases are clear. There is coronary vascular calcification. Small scattered pockets of intraperitoneal air noted in the anterior and upper abdomen. There has been interval increase in the pneumoperitoneum compared to the prior study. No free fluid. There is stone within the neck of the gallbladder. No pericholecystic fluid. The liver, pancreas, spleen, adrenal glands appear unremarkable. Small stable right renal hypodense lesions are not well characterized but may represent  cysts. Ultrasound is recommended for further evaluation. There is no hydronephrosis on either side. The visualized ureters and urinary bladder appear unremarkable. The prostate and seminal vesicles are grossly unremarkable. There is extensive sigmoid diverticulosis with muscular hypertrophy. There is focal abutment of the sigmoid colon to the anterior peritoneal wall compatible with adhesions. No definite active inflammatory changes identified. There is a tract like structure extending from the sigmoid colon along the left anterior pelvic wall which may represent residual air within previously seen  perisigmoid diverticular abscess or represent a fistulous tract. There is a small supraumbilical hernia with focal herniation of small bowel and resulting small bowel obstruction. A small fat containing umbilical hernia is noted. A small left periumbilical hernia is also seen containing small amount of fat with minimal protrusion of a small bowel wall. There is stranding of the periumbilical subcutaneous fat compatible with inflammatory changes and possible strangulation and incarceration of the herniated small bowel. Normal appendix. There is aortoiliac atherosclerotic disease. There is a 3.3 cm infrarenal abdominal aortic aneurysm as seen previously. Follow-up is recommended. The origins of the celiac axis and SMA appear patent. The origins of the renal arteries appear patent. The origin of the IMA appears thrombosed. No portal venous gas identified. There is no adenopathy. There is osteopenia with extensive degenerative changes of the spine. Left femoral intra medullary orthopedic hardware. Multilevel compression fracture, L3 vertebroplasty, and disc desiccation and vacuum phenomena noted. No acute fracture. IMPRESSION: Small supraumbilical hernia containing a short segment of small bowel with findings of strangulation/ incarceration as well as small-bowel obstruction. Sigmoid diverticulosis with muscular hypertrophy. A containing tract like structure extending from the sigmoid along the left anterior pelvic wall likely represents air within residual perisigmoid collection versus a fistulous tract. Pneumoperitoneum, increased from prior study. These results were called by telephone at the time of interpretation on 09/03/2015 at 6:24 am to Dr. Charlotte Crumb , who verbally acknowledged these results. Electronically Signed   By: Anner Crete M.D.   On: 09/03/2015 06:26    Review of Systems  Constitutional: Positive for malaise/fatigue. Negative for fever, chills and weight loss.  HENT: Negative for  congestion and sore throat.   Respiratory: Negative for cough, shortness of breath and wheezing.   Cardiovascular: Negative for chest pain and leg swelling.  Gastrointestinal: Positive for heartburn, nausea, vomiting, abdominal pain and constipation. Negative for diarrhea.  Genitourinary: Negative for dysuria, urgency, hematuria and flank pain.  Musculoskeletal: Negative for myalgias and joint pain.  Skin: Negative for itching and rash.  Neurological: Positive for weakness. Negative for dizziness and loss of consciousness.  Psychiatric/Behavioral: The patient is not nervous/anxious.   All other systems reviewed and are negative.   Blood pressure 114/64, pulse 74, temperature 96.3 F (35.7 C), temperature source Oral, resp. rate 16, height 5' 10"  (1.778 m), weight 206 lb (93.441 kg), SpO2 98 %. Physical Exam  Vitals reviewed. Constitutional: He is oriented to person, place, and time. He appears well-developed and well-nourished. No distress.  HENT:  Head: Normocephalic.  Right Ear: External ear normal.  Left Ear: External ear normal.  Nose: Nose normal.  Mouth/Throat: Oropharynx is clear and moist. No oropharyngeal exudate.  Eyes: EOM are normal. Pupils are equal, round, and reactive to light. No scleral icterus.  Neck: Normal range of motion. Neck supple. No tracheal deviation present.  Cardiovascular: Normal rate, regular rhythm, normal heart sounds and intact distal pulses.  Exam reveals no gallop and no friction rub.   No murmur heard. Respiratory:  Effort normal and breath sounds normal. No respiratory distress. He has no wheezes. He has no rales.  GI: Soft. Bowel sounds are normal. He exhibits distension. There is tenderness. There is no rebound and no guarding.  Moderately distended abdomen, umbilical defect approximately 2cm unable to fully reduce, tender surrounding area  Musculoskeletal: Normal range of motion. He exhibits no edema or tenderness.  Neurological: He is alert and  oriented to person, place, and time. No cranial nerve deficit.  Skin: Skin is warm and dry. No rash noted. No erythema. No pallor.  Psychiatric: He has a normal mood and affect. His behavior is normal. Judgment and thought content normal.     Assessment/Plan 82 yr old male with incarcerated umbilical hernia.  I have personally reviewed his past medical history, including recent visits for diverticulitis, he did not have any incisions on his umbilicus in a few years, hernia present for a while.  I personally reviewed his laboratory values which are normal except for elevated lactic acid.  I personally reviewed his CT scan images showing Ritchers hernia of small bowel into umbilical hernia site causing small bowel obstruction.  I also reviewed the radiology read as above.  The risks, benefits, complications, treatment options, and expected outcomes were discussed with the patient. The possibilities of heart attack, stroke, blood clots to legs or lungs, pulmoary aspiration, prolonged ICU stay and death were reviewed along with risk of bleeding, recurrent infection, perforation of viscus organs, need to resect bowel, damage to surrounding structures, abscess formation, needing a drain placed, possible need for mesh placement, the need for additional procedures, reaction to medication,  failure to diagnose a condition, the possible, and creating a complication requiring transfusion or operation were discussed with the patient. The patient and family concurred with the proposed plan, giving informed consent for Umbilical hernia repair, possible bowel resection possible mesh placement.     Gift Rueckert L Deliah Strehlow 09/03/2015, 1:58 PM

## 2015-09-03 NOTE — Anesthesia Preprocedure Evaluation (Addendum)
Anesthesia Evaluation  Patient identified by MRN, date of birth, ID band Patient awake    Reviewed: Allergy & Precautions, NPO status , Patient's Chart, lab work & pertinent test results  Airway Mallampati: II  TM Distance: >3 FB Neck ROM: Limited    Dental  (+) Teeth Intact, Missing Teeth are mainly intact--missing one or two lower teeth. He says none are loose.:   Pulmonary shortness of breath and with exertion, asthma , COPD, former smoker,  Not using inhalers.   Pulmonary exam normal        Cardiovascular Exercise Tolerance: Poor hypertension, Pt. on medications and Pt. on home beta blockers + Peripheral Vascular Disease  Normal cardiovascular exam     Neuro/Psych    GI/Hepatic   Endo/Other  Treated.  Renal/GU      Musculoskeletal  (+) Arthritis , Osteoarthritis,    Abdominal (+) + obese,   Peds  Hematology   Anesthesia Other Findings   Reproductive/Obstetrics                            Anesthesia Physical Anesthesia Plan  ASA: III and emergent  Anesthesia Plan: General   Post-op Pain Management:    Induction: Intravenous  Airway Management Planned: Oral ETT  Additional Equipment:   Intra-op Plan:   Post-operative Plan: Extubation in OR  Informed Consent:   Dental advisory given and Consent reviewed with POA  Plan Discussed with: CRNA  Anesthesia Plan Comments: (His wife is his POA and has signed the consent.)       Anesthesia Quick Evaluation

## 2015-09-03 NOTE — ED Notes (Signed)
Dr Burlene Arnt present and informed pt's lactic acid is 2.1; acknowledged

## 2015-09-03 NOTE — Transfer of Care (Signed)
Immediate Anesthesia Transfer of Care Note  Patient: David Morgan  Procedure(s) Performed: Procedure(s): umbilical hernia repair with mesh (N/A)  Patient Location: PACU  Anesthesia Type:General  Level of Consciousness: patient cooperative and lethargic  Airway & Oxygen Therapy: Patient Spontanous Breathing and Patient connected to face mask oxygen  Post-op Assessment: Report given to RN and Post -op Vital signs reviewed and stable  Post vital signs: Reviewed and stable  Last Vitals:  Filed Vitals:   09/03/15 1827 09/03/15 1829  BP: 152/76 152/76  Pulse: 76   Temp: 36.4 C 36.4 C  Resp: 16 16    Complications: No apparent anesthesia complications

## 2015-09-03 NOTE — ED Notes (Addendum)
Pt has started and finished his oral contrast for CT; radiology tech aware

## 2015-09-03 NOTE — ED Notes (Signed)
Pt to triage via w/c with no distress noted; pt c/o mid abd pain accomp by N/V; wife reports 3rd visit here since November for same and was dx with diverticulitis but "doesn't believe that is what it is"

## 2015-09-03 NOTE — Anesthesia Procedure Notes (Signed)
Procedure Name: Intubation Date/Time: 09/03/2015 3:45 PM Performed by: Nelda Marseille Pre-anesthesia Checklist: Patient identified, Patient being monitored, Timeout performed, Emergency Drugs available and Suction available Patient Re-evaluated:Patient Re-evaluated prior to inductionOxygen Delivery Method: Circle system utilized Preoxygenation: Pre-oxygenation with 100% oxygen Intubation Type: IV induction Ventilation: Mask ventilation without difficulty Laryngoscope Size: Mac and 3 Grade View: Grade I Tube type: Oral Tube size: 7.0 mm Number of attempts: 1 Airway Equipment and Method: Stylet Placement Confirmation: ETT inserted through vocal cords under direct vision,  positive ETCO2 and breath sounds checked- equal and bilateral Secured at: 21 cm Tube secured with: Tape Dental Injury: Teeth and Oropharynx as per pre-operative assessment

## 2015-09-03 NOTE — ED Notes (Signed)
Pt belching but denies nausea; pain improved; waiting for CT results

## 2015-09-03 NOTE — Op Note (Signed)
Ventral Herniorrhaphy Procedure Note  Indications: Strangulated Ventral hernia  Pre-operative Diagnosis: Strangulated ventral hernia  Post-operative Diagnosis: same  Surgeon: Hubbard Robinson   Assistants: Dr. Marta Lamas  Anesthesia: General endotracheal anesthesia  ASA Class: 3  Procedure Details  The patient was seen in the Holding Room. The risks, benefits, complications, treatment options, and expected outcomes were discussed with the patient. The possibilities of reaction to medication, pulmonary aspiration, perforation of viscus, bleeding, recurrent infection, the need for additional procedures, failure to diagnose a condition, and creating a complication requiring transfusion or operation were discussed with the patient. The patient concurred with the proposed plan, giving informed consent. The patient was taken to Operating Room, identified as David Morgan and the procedure verified as Ventral Herniorrhaphy. A Time Out was held and the above information confirmed.  The patient was placed supine.  After establishing general anesthesia, the abdomen was prepped and draped in standard fashion.  0.50% Marcaine with epinephrine was used to anesthetize the skin.  periumbilical incision.  Dissection was carried down to the hernia sac located above the fascia and mobilized from surrounding structures.  Intact fascia was identified circumferentially around the defect of 4cm in diameter. A large amount of omentum was eventrating through the defect. The surrounding fascia was clearly visualized.  The omentum of mobilized off the fascia and carefully dissected around.  A small portion of bowel was inside this defect creating a Ritcher's hernia.  The bowel was reduced and was viable without any necrosis. Another band was tethering some small bowel and this was dissected as well.  The bowel was placed back inside the abdomen.  A 8cm round Ventralex hernia patch was then placed inside the defect.   The mesh was secured with a 2-0 Prolene  The soft tissue was irrigated and closed in layers.  Hemostasis was confirmed.  The skin incision was closed in layers with a 4-0 Vicryl subcuticular closure. Surgical glue was then applied at the end of the operation.    Instrument, sponge, and needle counts were correct prior to closure and at the conclusion of the case.   Findings: Small bowel Ritcher's hernia and omentum incarcerated in defect, bowel was viable, 8cm Ventralex mesh placed  Estimated Blood Loss:  less than 50 mL         Drains: none         Total IV Fluids: 1052ml         Specimens: Ventral hernia sac         Implants: Ventralax hernia Patch         Complications:  None; patient tolerated the procedure well.         Disposition: PACU - hemodynamically stable.         Condition: stable

## 2015-09-03 NOTE — ED Provider Notes (Addendum)
[facility] Emergency Department Provider Note  ____________________________________________   I have reviewed the triage vital signs and the nursing notes.   HISTORY  Chief Complaint Abdominal Pain    HPI David Morgan is a 82 y.o. male with a very, complicated surgical history including abdominal abscesses in the past, multiple umbilical hernia repairs presents today coming of vomiting since this morning with diffuse abdominal pain. No fevers. No bloody or bilious vomiting. Does not recall his last bowel movement.   Past Medical History  Diagnosis Date  . Lesion of bladder   . History of bladder cancer   . Hypertension   . Hyperlipidemia   . Peripheral vascular disease ([place])   . Claudication ([place])   . Emphysema/COPD ([place])   . Mild asthma     NO INHALER  . Short of breath on exertion   . Hypothyroidism   . Urgency of urination   . Nocturia   . Arthritis   . Wears glasses   . History of TIA (transient ischemic attack)     2012--  NO RESIDUAL (PER SCAN HAD A PREVIOUS TIA BEFORE 2012)  . History of carotid artery stenosis   . Cancer ([place])   . Diverticulitis     Patient Active Problem List   Diagnosis Date Noted  . Diverticulitis large intestine 08/06/2015  . Diverticulitis of intestine with perforation without bleeding   . Disease of thyroid gland 07/10/2015  . BP (high blood pressure) 07/10/2015  . HLD (hyperlipidemia) 07/10/2015  . Diverticulitis 06/28/2015  . Benign fibroma of prostate 08/02/2014    Past Surgical History  Procedure Laterality Date  . Transurethral resection of bladder tumor  1990  . Total knee arthroplasty Right 2004  . Carotid endarterectomy Right 2005  . Carpal tunnel release Bilateral 2002  &  2007  . Right shoulder  surgery  2005  . Orif hip fracture Left 2008    RETAINED HARDWARE  . Umbilical hernia repair   2009  &  2011  . Inguinal hernia repair  YRS AGO  . Cataract extraction w/ intraocular lens   implant, bilateral    . Cystoscopy with biopsy N/A 12/26/2013    Procedure: CYSTOSCOPY WITH BLADDER BIOPSY;  Surgeon: David Jabs, MD;  Location: [facility];  Service: Urology;  Laterality: N/A;  . Cystoscopy w/ retrogrades Bilateral 12/26/2013    Procedure: BILATERAL RETROGRADE PYELOGRAM;  Surgeon: David Jabs, MD;  Location: [facility];  Service: Urology;  Laterality: Bilateral;  . Laparoscopy  06/28/2015    Procedure: LAPAROSCOPY DIAGNOSTIC;  Surgeon: David Pert, MD;  Location: [facility];  Service: General;;  . Laparoscopic lysis of adhesions  06/28/2015    Procedure: LAPAROSCOPIC LYSIS OF ADHESIONS;  Surgeon: David Pert, MD;  Location: [facility];  Service: General;;    Current Outpatient Rx  Name  Route  Sig  Dispense  Refill  . amLODipine (NORVASC) 2.5 MG tablet   Oral   Take 2.5 mg by mouth every morning.         [name] atorvastatin (LIPITOR) 40 MG tablet   Oral   Take 40 mg by mouth every evening.         . carvedilol (COREG) 3.125 MG tablet   Oral   Take 3.125 mg by mouth 2 (two) times daily with a meal.         . gabapentin (NEURONTIN) 100 MG capsule   Oral   Take 100 mg by mouth 3 (  three) times daily.          [name] levothyroxine (SYNTHROID, LEVOTHROID) 150 MCG tablet   Oral   Take 150 mcg by mouth every evening.         [name] losartan (COZAAR) 50 MG tablet   Oral   Take 50 mg by mouth daily.          . Multiple Vitamin (MULTI-VITAMINS) TABS   Oral   Take 1 tablet by mouth daily.         . tamsulosin (FLOMAX) 0.4 MG CAPS capsule   Oral   Take 0.8 mg by mouth every evening.         . ciprofloxacin (CIPRO) 500 MG tablet   Oral   Take 1 tablet (500 mg total) by mouth 2 (two) times daily. Patient not taking: Reported on 09/03/2015   20 tablet   0   . FLAGYL 500 MG tablet   Oral   Take 1 tablet (500 mg total) by mouth 3 (three) times daily. Patient not taking: Reported on 09/03/2015   30 tablet   1      Dispense as written.   [name] HYDROcodone-acetaminophen (NORCO) 5-325 MG tablet   Oral   Take 1 tablet by mouth every 6 (six) hours as needed for moderate pain. Patient not taking: Reported on 09/03/2015   30 tablet   0   . ondansetron (ZOFRAN) 4 MG tablet   Oral   Take 1 tablet (4 mg total) by mouth every 6 (six) hours as needed for nausea. Patient not taking: Reported on 09/03/2015   20 tablet   0     Allergies Adhesive and Augmentin  Family History  Problem Relation Age of Onset  . Hypertension Father     Social History Social History  Substance Use Topics  . Smoking status: Former Smoker -- 1.00 packs/day for 40 years    Types: Cigarettes    Quit date: 12/21/1993  . Smokeless tobacco: Never Used  . Alcohol Use: Yes     Comment: RARE    Review of Systems Constitutional: No fever/chills Eyes: No visual changes. ENT: No sore throat. No stiff neck no neck pain Cardiovascular: Denies chest pain. Respiratory: Denies shortness of breath. Gastrointestinal:   Positive vomiting.  No diarrhea.  No constipation. Genitourinary: Negative for dysuria. Musculoskeletal: Negative lower extremity swelling Skin: Negative for rash. Neurological: Negative for headaches, focal weakness or numbness. 10-point ROS otherwise negative.  ____________________________________________   PHYSICAL EXAM:  VITAL SIGNS: ED Triage Vitals  Enc Vitals Group     BP 09/03/15 0418 163/69 mmHg     Pulse Rate 09/03/15 0418 92     Resp 09/03/15 0430 19     Temp 09/03/15 0418 97.8 F (36.6 C)     Temp Source 09/03/15 0418 Oral     SpO2 09/03/15 0418 100 %     Weight 09/03/15 0418 206 lb (93.441 kg)     Height 09/03/15 0418 5\' 10"  (1.778 m)     Head Cir --      Peak Flow --      Pain Score 09/03/15 0417 4     Pain Loc --      Pain Edu? --      Excl. in [place]? --     Constitutional: Alert and oriented. Well appearing and in no acute distress. Eyes: Conjunctivae are normal. PERRL. EOMI. Head:  Atraumatic. Nose: No congestion/rhinnorhea. Mouth/Throat: Mucous membranes are moist.  Oropharynx non-erythematous. Neck: No stridor.  Nontender with no meningismus Cardiovascular: Normal rate, regular rhythm. Grossly normal heart sounds.  Good peripheral circulation. Respiratory: Normal respiratory effort.  No retractions. Lungs CTAB. Abdominal: Hypoactive bowel sounds with periumbilical hernia noted. Diffuse abdominal discomfort. No guarding or rebound. Obesity noted. Back:  There is no focal tenderness or step off there is no midline tenderness there are no lesions noted. there is no CVA tenderness Musculoskeletal: No lower extremity tenderness. No joint effusions, no DVT signs strong distal pulses no edema Neurologic:  Normal speech and language. No gross focal neurologic deficits are appreciated.  Skin:  Skin is warm, dry and intact. No rash noted. Psychiatric: Mood and affect are normal. Speech and behavior are normal.  ____________________________________________   LABS (all labs ordered are listed, but only abnormal results are displayed)  Labs Reviewed  LACTIC ACID, PLASMA - Abnormal; Notable for the following:    Lactic Acid, Venous 2.1 (*)    All other components within normal limits  COMPREHENSIVE METABOLIC PANEL - Abnormal; Notable for the following:    Chloride 97 (*)    Glucose, Bld 172 (*)    Total Protein 8.2 (*)    Total Bilirubin 1.5 (*)    All other components within normal limits  CBC WITH DIFFERENTIAL/PLATELET - Abnormal; Notable for the following:    Hemoglobin 12.8 (*)    HCT 38.5 (*)    RDW 15.3 (*)    Neutro Abs 7.8 (*)    All other components within normal limits  TROPONIN I  LIPASE, BLOOD  LACTIC ACID, PLASMA   ____________________________________________  EKG  I personally interpreted any EKGs ordered by me or triage Normal sinus rhythm Nonspecific ST and T wave abnormality Abnormal ECG When compared with ECG of 06-Aug-2015  14:07, Non-specific change in ST segment in Inferior leads Nonspecific T wave abnormality now evident in Inferior ____________________________________________  RADIOLOGY  I reviewed any imaging ordered by me or triage that were performed during my shift ____________________________________________   PROCEDURES  Procedure(s) performed: None  Critical Care performed: None  ____________________________________________   INITIAL IMPRESSION / ASSESSMENT AND PLAN / ED COURSE  Pertinent labs & imaging results that were available during my care of the patient were reviewed by me and considered in my medical decision making (see chart for details). Upon arrival I did apply direct pressure to the umbilical hernia to see if it was fat containing or intestines. Morbid obesity and scar tissue somewhat limited ability to make that determination clinically.  As it was causing the patient discomfort I stop pending CT scan. CT scan however shows an incarcerated hernia in that area with an SBO. Patient is not vomiting and is more comfortable here. I discussed with Dr. Pat Patrick he will see the patient. I did apply gentle and steady pressure to the hernia. It did appear to diminish I cannot say for sure it is completely reduced.  ____________________________________________   FINAL CLINICAL IMPRESSION(S) / ED DIAGNOSES  Final diagnoses:  SBO (small bowel obstruction) (HCC)     David Amor, MD 09/03/15 [place], MD 09/15/15 [place], MD 09/15/15 [phone]

## 2015-09-04 ENCOUNTER — Encounter: Payer: Self-pay | Admitting: Surgery

## 2015-09-04 LAB — BASIC METABOLIC PANEL
ANION GAP: 9 (ref 5–15)
BUN: 18 mg/dL (ref 6–20)
CHLORIDE: 102 mmol/L (ref 101–111)
CO2: 29 mmol/L (ref 22–32)
Calcium: 8.1 mg/dL — ABNORMAL LOW (ref 8.9–10.3)
Creatinine, Ser: 0.81 mg/dL (ref 0.61–1.24)
GFR calc Af Amer: >60 mL/min (ref >60)
GLUCOSE: 166 mg/dL — AB (ref 65–99)
POTASSIUM: 3.3 mmol/L — AB (ref 3.5–5.1)
Sodium: 140 mmol/L (ref 135–145)

## 2015-09-04 MED ORDER — KCL IN DEXTROSE-NACL 20-5-0.45 MEQ/L-%-% IV SOLN
INTRAVENOUS | Status: DC
  Administered 2015-09-04 – 2015-09-05 (×4): via INTRAVENOUS
  Filled 2015-09-04 (×6): qty 1000

## 2015-09-04 NOTE — Progress Notes (Signed)
Initial Nutrition Assessment     INTERVENTION:  Meals and snacks: Await diet progression per MD   NUTRITION DIAGNOSIS:   Inadequate oral intake related to altered GI function as evidenced by NPO status.    GOAL:   Patient will meet greater than or equal to 90% of their needs    MONITOR:    (Energy intake, Digestive system)  REASON FOR ASSESSMENT:   Malnutrition Screening Tool    ASSESSMENT:      Pt admitted with strangulated ventral hernia, mesh placed  Past Medical History  Diagnosis Date  . Lesion of bladder   . History of bladder cancer   . Hypertension   . Hyperlipidemia   . Peripheral vascular disease ([place])   . Claudication ([place])   . Emphysema/COPD ([place])   . Mild asthma     NO INHALER  . Short of breath on exertion   . Hypothyroidism   . Urgency of urination   . Nocturia   . Arthritis   . Wears glasses   . History of TIA (transient ischemic attack)     2012--  NO RESIDUAL (PER SCAN HAD A PREVIOUS TIA BEFORE 2012)  . History of carotid artery stenosis   . Cancer ([place])   . Diverticulitis    Recent admission with diverticultitis   Current Nutrition: NPO  Food/Nutrition-Related History: Pt and wife report poor po intake over the last 1-2 months   Scheduled Medications:  . acetaminophen  1,000 mg Oral 4 times per day  . amLODipine  2.5 mg Oral q morning - 10a  . carvedilol  3.125 mg Oral BID WC  . enoxaparin (LOVENOX) injection  40 mg Subcutaneous Q24H  . ketorolac  15 mg Intravenous 4 times per day  . levothyroxine  150 mcg Oral QPM  . losartan  50 mg Oral Daily  . pantoprazole (PROTONIX) IV  40 mg Intravenous QHS  . tamsulosin  0.8 mg Oral QPM    Continuous Medications:  . dextrose 5 % and 0.45 % NaCl with KCl 20 mEq/L 100 mL/hr at 09/04/15 0833     Electrolyte/Renal Profile and Glucose Profile:   Recent Labs Lab 09/03/15 0459 09/04/15 0524  NA 138 140  K 4.2 3.3*  CL 97* 102  CO2 32 29  BUN 17 18  CREATININE 0.85 0.81   CALCIUM 9.5 8.1*  GLUCOSE 172* 166*   Protein Profile:  Recent Labs Lab 09/03/15 0459  ALBUMIN 3.7    Gastrointestinal Profile: Last BM: unsure   Nutrition-Focused Physical Exam Findings: Nutrition-Focused physical exam completed. Findings are normal fat depletion, normal muscle depletion, and no edema.      Weight Change: 7% wt loss in the last 2 months    Diet Order:  Diet NPO time specified Except for: Sips with Meds  Skin:   reviewed     Height:   Ht Readings from Last 1 Encounters:  09/04/15 5\' 10"  (1.778 m)    Weight:   Wt Readings from Last 1 Encounters:  09/04/15 222 lb (100.699 kg)    Ideal Body Weight:     BMI:  Body mass index is 31.85 kg/(m^2).  Estimated Nutritional Needs:   Kcal:  BEE 1426 kcals (IF 1.1-1.3, AF 1.3) [id number] kcals/d. Using IBW of 75kg  Protein:  (1.1-1.3 g/kg) 83-98 g/d  Fluid:  (25-28ml/kg) 1875-2243ml/d  EDUCATION NEEDS:   No education needs identified at this time  [place]. Zenia Resides, [place], [place] (pager) Weekend/On-Call pager [phone])

## 2015-09-04 NOTE — Anesthesia Postprocedure Evaluation (Signed)
Anesthesia Post Note  Patient: David Morgan  Procedure(s) Performed: Procedure(s) (LRB): umbilical hernia repair with mesh (N/A)  Patient location during evaluation: PACU Anesthesia Type: General Level of consciousness: awake Pain management: pain level controlled Vital Signs Assessment: post-procedure vital signs reviewed and stable Respiratory status: spontaneous breathing Cardiovascular status: blood pressure returned to baseline Anesthetic complications: no    Last Vitals:  Filed Vitals:   09/03/15 2059 09/04/15 0117  BP: 130/59 113/50  Pulse: 74 67  Temp: 36.3 C 36.4 C  Resp: 20 18    Last Pain:  Filed Vitals:   09/04/15 0438  PainSc: Asleep                 Ly Wass S

## 2015-09-04 NOTE — Progress Notes (Signed)
RN educated pt on incentive and pt reached 1500. Goal is 2000. Will continue to encourage usage.   Angus Seller

## 2015-09-04 NOTE — Progress Notes (Signed)
CCU nurse notified RN of pt running 5 beats of Vtach. RN notified MD. MD stated, "if pt. Continues to have Vtach we would need a EKG." RN asked CCU nurse for another reading of telemetry strip and the reading was NSR. Will continue to monitor.   David Morgan

## 2015-09-04 NOTE — Progress Notes (Signed)
RN notified MD regarding pt.'s lactic acid at 0439 on 09/03/2015 and there was no second check of lactic acid. MD said there does not need to be a second check at this time. RN relayed messaged to on-coming RN, and called lab to cancel pending lactic acid order.  Angus Seller

## 2015-09-04 NOTE — Progress Notes (Deleted)
Pt arrived on unit. VSS.  David Morgan   

## 2015-09-04 NOTE — Progress Notes (Signed)
Pt arrived on unit. VSS.  David Morgan   

## 2015-09-04 NOTE — Progress Notes (Signed)
82 yr old male POD#1 from Umbilical hernia repair with small bowel strangulation.  Patient doing well today, states feeling much better.  He states only pain with coughing and moving around.  He is pulling 1500 on his IS.   Filed Vitals:   09/04/15 1149 09/04/15 2129  BP: 110/54 116/57  Pulse: 59 58  Temp: 97.7 F (36.5 C) 97.7 F (36.5 C)  Resp: 16     I/O last 3 completed shifts: In: 2313.5 [I.V.:2283.5; NG/GT:30] Out: [id number] [Urine:1225; Emesis/NG output:600; Blood:15] Total I/O In: 417 [I.V.:417] Out: 175 [Urine:175]    PE:   Gen: NAD Abd: soft, appropriately tender, incisions c/d/i  Ext: 2+ pulses, no edema  CBC Latest Ref Rng 09/03/2015 08/09/2015 08/08/2015  WBC 3.8 - 10.6 K/uL 9.6 6.5 6.9  Hemoglobin 13.0 - 18.0 g/dL 12.8(L) 11.6(L) 12.3(L)  Hematocrit 40.0 - 52.0 % 38.5(L) 35.9(L) 37.4(L)  Platelets 150 - 440 K/uL 217 161 159    CMP Latest Ref Rng 09/04/2015 09/03/2015 08/08/2015  Glucose 65 - 99 mg/dL 166(H) 172(H) 151(H)  BUN 6 - 20 mg/dL 18 17 6   Creatinine 0.61 - 1.24 mg/dL 0.81 0.85 0.50(L)  Sodium 135 - 145 mmol/L 140 138 134(L)  Potassium 3.5 - 5.1 mmol/L 3.3(L) 4.2 3.5  Chloride 101 - 111 mmol/L 102 97(L) 105  CO2 22 - 32 mmol/L 29 32 24  Calcium 8.9 - 10.3 mg/dL 8.1(L) 9.5 8.3(L)  Total Protein 6.5 - 8.1 g/dL - 8.2(H) -  Total Bilirubin 0.3 - 1.2 mg/dL - 1.5(H) -  Alkaline Phos 38 - 126 U/L - 67 -  AST 15 - 41 U/L - 31 -  ALT 17 - 63 U/L - 22 -    A/P:  Doing well postoperatively, d/c NG tube today, will slowly advance diet tomorrow,  Will have PT see for deconditioning, walking with a walker since admission in nov.

## 2015-09-05 ENCOUNTER — Ambulatory Visit: Payer: PPO | Admitting: Surgery

## 2015-09-05 ENCOUNTER — Inpatient Hospital Stay: Payer: PPO

## 2015-09-05 ENCOUNTER — Encounter: Payer: Self-pay | Admitting: Surgery

## 2015-09-05 DIAGNOSIS — J9811 Atelectasis: Secondary | ICD-10-CM | POA: Diagnosis not present

## 2015-09-05 LAB — SURGICAL PATHOLOGY

## 2015-09-05 MED ORDER — HYDROCODONE-ACETAMINOPHEN 5-300 MG PO TABS: 1.0000 | ORAL_TABLET | ORAL | Status: DC | PRN

## 2015-09-05 NOTE — Evaluation (Signed)
Physical Therapy Evaluation Patient Details Name: David Morgan MRN: [id number] DOB: [birth year] Today's Date: 09/05/2015   History of Present Illness  Patient is an 82 y/o male that presents with abdominal pain and nausea/vomiting. Recently admitted for diverticulitis with laproscopic drainage of abscess.   Clinical Impression  Patient is a pleasant 82 y/o male with multiple admissions in past 3 months for GI related complaints. In this session he demonstrates increased difficulty with supine to sit transfer secondary to abdominal pain, at home patient sleeps in recliner chair. Patient falls posteriorly on first 3 attempts with standing, however he is able to complete with no loss of balance, though decreased speed with transfer. Patient initially takes decreased step lengths in shuffling pattern, until cued by therapist, at which point he is able to increase his step lengths to what appears to be his baseline. This appears to be roughly his baseline and has been receiving HHPT for generalized weakness, which he should continue to benefit from as demonstrated by his difficulty with sit to stand transfer. Skilled acute PT services are indicated to address the above deficits.     Follow Up Recommendations Home health PT    Equipment Recommendations    Discuss with patient if he has RW for himself and wife (they both need AD).    Recommendations for Other Services       Precautions / Restrictions Precautions Precautions: Fall Restrictions Weight Bearing Restrictions: No      Mobility  Bed Mobility Overal bed mobility: Needs Assistance Bed Mobility: Supine to Sit     Supine to sit: HOB elevated;Min assist     General bed mobility comments: Patient demonstrates abdominal weakness in light of recent operations (pain limiting), he is able to negotiate his LEs independently, however he requires min A x1 from PT to manage trunk. He typically sleeps in a recliner.    Transfers Overall transfer level: Needs assistance Equipment used: Rolling walker (2 wheeled) Transfers: Sit to/from Stand Sit to Stand: Min guard         General transfer comment: Patient requires multiple attempts to transfer sit to stand with RW and cuing for leaning trunk anteriorly.   Ambulation/Gait Ambulation/Gait assistance: Supervision Ambulation Distance (Feet): 200 Feet Assistive device: Rolling walker (2 wheeled) Gait Pattern/deviations: Wide base of support;Decreased step length - left;Decreased step length - right;Shuffle   Gait velocity interpretation: Below normal speed for age/gender General Gait Details: Patient initially demonstrates significantly decreased stride length, similar to shuffling pattern. With cuing he is able to increase gait speed and normalize gait pattern.  Stairs            Wheelchair Mobility    Modified Rankin (Stroke Patients Only)       Balance Overall balance assessment: Needs assistance Sitting-balance support: No upper extremity supported Sitting balance-Leahy Scale: Good     Standing balance support: Bilateral upper extremity supported Standing balance-Leahy Scale: Fair                               Pertinent Vitals/Pain Pain Assessment: Faces Faces Pain Scale: Hurts little more Pain Location: Abdomen Pain Descriptors / Indicators: Aching Pain Intervention(s): Limited activity within patient's tolerance;Monitored during session;Patient requesting pain meds-RN notified;Repositioned    Home Living Family/patient expects to be discharged to:: Private residence Living Arrangements: Spouse/significant other Available Help at Discharge: Family;Available PRN/intermittently Type of Home: House Home Access: Ramped entrance     Home Layout: One  level Home Equipment: Walker - 4 wheels;Shower seat;Grab bars - toilet;Grab bars - tub/shower;Toilet riser;Walker - 2 wheels Additional Comments: RW at home;  Rollator in community    Prior Function Level of Independence: Independent with assistive device(s)         Comments: Pt getting home health PT prior to admission.  At baseline pt able to "walk from one side of [employer] to the other".       Hand Dominance        Extremity/Trunk Assessment   Upper Extremity Assessment: Overall WFL for tasks assessed           Lower Extremity Assessment: Overall WFL for tasks assessed         Communication   Communication: No difficulties  Cognition Arousal/Alertness: Awake/alert Behavior During Therapy: WFL for tasks assessed/performed Overall Cognitive Status: Within Functional Limits for tasks assessed (Somewhat hard of hearing)                      General Comments      Exercises        Assessment/Plan    PT Assessment Patient needs continued PT services  PT Diagnosis Difficulty walking;Generalized weakness   PT Problem List Decreased strength;Decreased knowledge of use of DME;Decreased safety awareness;Decreased balance;Decreased mobility;Pain  PT Treatment Interventions DME instruction;Gait training;Therapeutic activities;Therapeutic exercise;Balance training   PT Goals (Current goals can be found in the Care Plan section) Acute Rehab PT Goals Patient Stated Goal: To return home when his abdomen isn't as painful.  PT Goal Formulation: With patient/family Time For Goal Achievement: 09/19/15 Potential to Achieve Goals: Good    Frequency Min 2X/week   Barriers to discharge Decreased caregiver support Patient's wife is elderly and unable to provide physical assistance if he were to require it.     Co-evaluation               End of Session Equipment Utilized During Treatment: Gait belt Activity Tolerance: Patient tolerated treatment well Patient left: in chair;with chair alarm set;with family/visitor present;with call bell/phone within reach Nurse Communication: Mobility status         Time:  [id number] PT Time Calculation (min) (ACUTE ONLY): 20 min   Charges:   PT Evaluation $PT Eval Moderate Complexity: 1 Procedure     PT G Codes:       Kerman Passey, PT, DPT    09/05/2015, 1:46 PM

## 2015-09-05 NOTE — Progress Notes (Signed)
2 Days Post-Op  Subjective: Status post incarcerated umbilical hernia repair. He has no complaints minimal pain and tolerating a diet.  Objective: Vital signs in last 24 hours: Temp:  [97.7 F (36.5 C)] 97.7 F (36.5 C) (01/18 0600) Pulse Rate:  [55-59] 55 (01/18 0600) Resp:  [16-17] 17 (01/18 0600) BP: (110-127)/(54-57) 127/57 mmHg (01/18 0600) SpO2:  [93 %-98 %] 96 % (01/18 0600) Last BM Date:  (pt unsure)  Intake/Output from previous day: 01/17 0701 - 01/18 0700 In: 991 [I.V.:961; NG/GT:30] Out: 925 [Urine:625; Emesis/NG output:300] Intake/Output this shift: Total I/O In: 408.3 [I.V.:408.3] Out: -   Physical exam:  Soft nontender abdomen wound healing well minimal ecchymosis no erythema or drainage. Nontender calves  Lab Results: CBC   Recent Labs  09/03/15 0459  WBC 9.6  HGB 12.8*  HCT 38.5*  PLT 217   BMET  Recent Labs  09/03/15 0459 09/04/15 0524  NA 138 140  K 4.2 3.3*  CL 97* 102  CO2 32 29  GLUCOSE 172* 166*  BUN 17 18  CREATININE 0.85 0.81  CALCIUM 9.5 8.1*   PT/INR  Recent Labs  09/03/15 0640  LABPROT 13.5  INR 1.01   ABG No results for input(s): PHART, HCO3 in the last 72 hours.  Invalid input(s): PCO2, PO2  Studies/Results: No results found.  Anti-infectives: Anti-infectives    Start     Dose/Rate Route Frequency Ordered Stop   09/03/15 1100  cefOXitin (MEFOXIN) 2 g in dextrose 5 % 50 mL IVPB     2 g 100 mL/hr over 30 Minutes Intravenous  Once 09/03/15 1046 09/03/15 1559      Assessment/Plan: s/p Procedure(s): umbilical hernia repair with mesh   Patient feels well will advance diet possibly home later today.  Florene Glen, MD, FACS  09/05/2015

## 2015-09-05 NOTE — Discharge Instructions (Signed)
°  May shower in 24 hours. . Resume all home medications. Follow-up with Dr. Azalee Course in 10 days.

## 2015-09-05 NOTE — Progress Notes (Signed)
Patient with new cough since this morning.  Dr. Burt Knack called and notified.  Telephone order received.

## 2015-09-05 NOTE — Care Management Important Message (Signed)
Important Message  Patient Details  Name: David Morgan MRN: [id number] Date of Birth: November 24, 1924   Medicare Important Message Given:  Yes    Juliann Pulse A Kunal Levario 09/05/2015, 1:13 PM

## 2015-09-06 MED ORDER — PANTOPRAZOLE SODIUM 40 MG PO TBEC: 40.0000 mg | DELAYED_RELEASE_TABLET | Freq: Every day | ORAL | Status: DC

## 2015-09-06 NOTE — Progress Notes (Signed)
3 Days Post-Op  Subjective: Feels well tolerating a full liquid diet wants to advance diet this morning.  Objective: Vital signs in last 24 hours: Temp:  [97.8 F (36.6 C)-98.5 F (36.9 C)] 98.5 F (36.9 C) (01/19 0634) Pulse Rate:  [56-59] 58 (01/19 0634) Resp:  [17-22] 19 (01/19 0634) BP: (121-163)/(56-68) 163/68 mmHg (01/19 0634) SpO2:  [95 %-98 %] 96 % (01/19 0634) Last BM Date: 09/05/15  Intake/Output from previous day: 01/18 0701 - 01/19 0700 In: 2268.3 [P.O.:960; I.V.:1308.3] Out: 1825 [Urine:1825] Intake/Output this shift: Total I/O In: 0  Out: 200 [Urine:200]  Physical exam:  Comfortable-appearing wound is clean no erythema no drainage nontender calves  Lab Results: CBC  No results for input(s): WBC, HGB, HCT, PLT in the last 72 hours. BMET  Recent Labs  09/04/15 0524  NA 140  K 3.3*  CL 102  CO2 29  GLUCOSE 166*  BUN 18  CREATININE 0.81  CALCIUM 8.1*   PT/INR No results for input(s): LABPROT, INR in the last 72 hours. ABG No results for input(s): PHART, HCO3 in the last 72 hours.  Invalid input(s): PCO2, PO2  Studies/Results: Dg Chest Port 1 View  09/05/2015  CLINICAL DATA:  Abdominal pain, nausea and vomiting EXAM: PORTABLE CHEST 1 VIEW COMPARISON:  11/22/2007 FINDINGS: Cardiomediastinal silhouette is stable. No infiltrate or pulmonary edema. Mild basilar atelectasis. Bony thorax is stable. IMPRESSION: No infiltrate or pulmonary edema.  Bilateral basilar atelectasis. Electronically Signed   By: Lahoma Crocker M.D.   On: 09/05/2015 18:08    Anti-infectives: Anti-infectives    Start     Dose/Rate Route Frequency Ordered Stop   09/03/15 1100  cefOXitin (MEFOXIN) 2 g in dextrose 5 % 50 mL IVPB     2 g 100 mL/hr over 30 Minutes Intravenous  Once 09/03/15 1046 09/03/15 1559      Assessment/Plan: s/p Procedure(s): umbilical hernia repair with mesh   Patient doing very well advance diet later today and discharge to follow-up with Dr. Azalee Course in 10  days.  Florene Glen, MD, FACS  09/06/2015

## 2015-09-06 NOTE — Progress Notes (Signed)
I was asked to order physical therapy as an outpatient as well as nursing visits as an outpatient. In my estimation a patient with an umbilical hernia repair with no apparent postoperative complications who is been a hospital last 72 hours does not require outpatient home health or home PT and this was discussed with the social worker.

## 2015-09-06 NOTE — Progress Notes (Signed)
Key Points: Use following P&T approved IV to PO antibiotic change policy.  CONCERNING: IV to Oral Route Change Policy  RECOMMENDATION: This patient is receiving pantoprazole by the intravenous route.  Based on criteria approved by the Pharmacy and Therapeutics Committee, the intravenous medication(s) is/are being converted to the equivalent oral dose form(s).   DESCRIPTION: These criteria include:  The patient is eating (either orally or via tube) and/or has been taking other orally administered medications for a least 24 hours  The patient has no evidence of active gastrointestinal bleeding or impaired GI absorption (gastrectomy, short bowel, patient on TNA or NPO).  If you have questions about this conversion, please contact the Pharmacy Department  []   [phone] )  [facility] [x]   [phone] )  [facility] []   [phone] )  [facility] []   [phone] )  [facility] []   [phone] )  [place], [facility] 09/06/2015 8:30 AM

## 2015-09-06 NOTE — Care Management (Signed)
Spoke to Dr Burt Knack he does not feel that home health PT and RN is indicated at time of discharge.  Resumption orders will not be placed.  Corene Cornea from [facility] notified.  Bedside nurse Maudie Mercury will notify patient and wife at time of discharge.

## 2015-09-06 NOTE — Progress Notes (Signed)
Physical Therapy Treatment Patient Details Name: David Morgan MRN: [id number] DOB: 08-Sep-1924 Today's Date: 09/06/2015    History of Present Illness Patient is an 82 y/o male that presents with abdominal pain and nausea/vomiting. Recently admitted for diverticulitis with laproscopic drainage of abscess.     PT Comments    Pt with good tolerance to today's activity. CGA sit to stand however pt demonstrates decreased safety as he would reach out to counter despite cues for PHP and sequencing.  Ambulated 275ft x 1 with RW cues for erect posture with slow cadence noted.  Pt with c/o R "knee buckling" during ambulation however no LOB with activities.  Able to tolerance gentle seated and standing therex with therapeutic breaks as required d/t increased fatigue.    Follow Up Recommendations  Home health PT     Equipment Recommendations       Recommendations for Other Services       Precautions / Restrictions Precautions Precautions: Fall Restrictions Weight Bearing Restrictions: No    Mobility  Bed Mobility Overal bed mobility: Needs Assistance Bed Mobility: Supine to Sit     Supine to sit: Min guard;HOB elevated     General bed mobility comments: Pt requring add'l time required  Transfers Overall transfer level: Needs assistance Equipment used: Rolling walker (2 wheeled) Transfers: Sit to/from Stand Sit to Stand: Min guard         General transfer comment: PT required cues for safety, would reach out to counter, cues for PHP  Ambulation/Gait Ambulation/Gait assistance: Supervision Ambulation Distance (Feet): 200 Feet Assistive device: Rolling walker (2 wheeled) Gait Pattern/deviations: Wide base of support   Gait velocity interpretation: Below normal speed for age/gender General Gait Details: cues for erect posture    Stairs            Wheelchair Mobility    Modified Rankin (Stroke Patients Only)       Balance Overall balance assessment:  Needs assistance Sitting-balance support: No upper extremity supported Sitting balance-Leahy Scale: Good     Standing balance support: Bilateral upper extremity supported Standing balance-Leahy Scale: Fair                      Cognition Arousal/Alertness: Awake/alert Behavior During Therapy: WFL for tasks assessed/performed Overall Cognitive Status: Within Functional Limits for tasks assessed                      Exercises Other Exercises Other Exercises: Pt performed manual resistaed LAQ, HS curls b/l x 10. standing march x 10. static balance EO/EC     General Comments General comments (skin integrity, edema, etc.): Pt c/o R "knee buckling" towards end of ambulation       Pertinent Vitals/Pain Pain Assessment: 0-10 Pain Score: 4  Pain Location: Abdomen Pain Descriptors / Indicators: Discomfort Pain Intervention(s): Limited activity within patient's tolerance    Home Living                      Prior Function            PT Goals (current goals can now be found in the care plan section) Acute Rehab PT Goals Patient Stated Goal: To return home when his abdomen isn't as painful.  PT Goal Formulation: With patient Time For Goal Achievement: 09/19/15 Potential to Achieve Goals: Good    Frequency  Min 2X/week    PT Plan      Co-evaluation  End of Session Equipment Utilized During Treatment: Gait belt Activity Tolerance: Patient tolerated treatment well;Patient limited by fatigue Patient left: in bed;with call bell/phone within reach;with bed alarm set;with family/visitor present     Time: 1005-1030 PT Time Calculation (min) (ACUTE ONLY): 25 min  Charges:  $Gait Training: 8-22 mins $Therapeutic Exercise: 8-22 mins                    G Codes:      David Morgan 09/29/2015, 12:12 PM David Morgan, PTA

## 2015-09-07 NOTE — Discharge Summary (Signed)
Physician Discharge Summary  Patient ID: David Morgan MRN: [id number] DOB/AGE: 1924/10/02 82 y.o.  Admit date: 09/03/2015 Discharge date: 09/07/2015  Admission Diagnoses: INcarcerated umbilical hernia  Discharge Diagnoses:  Principal Problem:   Incarcerated umbilical hernia Active Problems:   BP (high blood pressure)   SBO (small bowel obstruction) ([place])   Discharged Condition: good  Hospital Course: 82 yr old male with incarcerated umbilical hernia.  Patient did well postoperatively and able to remove NG tube POD#1.  He was slowly able to be advanced with diet.  He was walking with a walker at home prior to admission and was seen by Physical therapy and they recommended home PT for deconditioning.    Consults: None  Significant Diagnostic Studies: CT scan showing umbilical hernia with strangulation  Treatments: surgery: Open umbilical hernia repair  Discharge Exam: Blood pressure 160/68, pulse 66, temperature 98.1 F (36.7 C), temperature source Oral, resp. rate 15, height 5\' 10"  (1.778 m), weight 222 lb (100.699 kg), SpO2 97 %. Please see Dr. Antionette Char note for physical exam findings on 1/19.   Disposition: 01-Home or Self Care     Medication List    STOP taking these medications        FLAGYL 500 MG tablet  Generic drug:  metroNIDAZOLE     HYDROcodone-acetaminophen 5-325 MG tablet  Commonly known as:  NORCO  Replaced by:  Hydrocodone-Acetaminophen 5-300 MG Tabs      TAKE these medications        amLODipine 2.5 MG tablet  Commonly known as:  NORVASC  Take 2.5 mg by mouth every morning.     atorvastatin 40 MG tablet  Commonly known as:  LIPITOR  Take 40 mg by mouth every evening.     carvedilol 3.125 MG tablet  Commonly known as:  COREG  Take 3.125 mg by mouth 2 (two) times daily with a meal.     ciprofloxacin 500 MG tablet  Commonly known as:  CIPRO  Take 1 tablet (500 mg total) by mouth 2 (two) times daily.     gabapentin 100 MG capsule   Commonly known as:  NEURONTIN  Take 100 mg by mouth 3 (three) times daily.     Hydrocodone-Acetaminophen 5-300 MG Tabs  Commonly known as:  VICODIN  Take 1 tablet by mouth every 4 (four) hours as needed.     levothyroxine 150 MCG tablet  Commonly known as:  SYNTHROID, LEVOTHROID  Take 150 mcg by mouth every evening.     losartan 50 MG tablet  Commonly known as:  COZAAR  Take 50 mg by mouth daily.     MULTI-VITAMINS Tabs  Take 1 tablet by mouth daily.     ondansetron 4 MG tablet  Commonly known as:  ZOFRAN  Take 1 tablet (4 mg total) by mouth every 6 (six) hours as needed for nausea.     tamsulosin 0.4 MG Caps capsule  Commonly known as:  FLOMAX  Take 0.8 mg by mouth every evening.           Follow-up Information    Follow up with Hubbard Robinson, MD. Go on 09/17/2015.   Specialty:  Surgery   Why:  [place] at 1:30pm for hospital follow-up and for wound re-check   Contact information:   [address] [place] [address] [phone]       Signed: Hubbard Robinson 09/07/2015, 7:30 PM

## 2015-09-11 ENCOUNTER — Telehealth: Payer: Self-pay

## 2015-09-11 NOTE — Telephone Encounter (Signed)
Post-discharge call made to patient. Pain under control at this time. Patient does have concerns regarding constipation. Informed patient that they should increase water intake and activity level as much as possible to help with bowel movement. Also educated that they may use Miralax over the counter x 2 doses, 6 hours apart, followed by Dulcolax x 2 doses, 6 hours apart until they have a successful bowel movement. Asked patient to call back for further instructions if after taking both doses of these medications, they are still unsuccessful with a bowel movement.  Patient verbalizes understanding of this information.  Denies any other questions or concerns.  Confirmed post-op appointment information. Encouraged patient to call back with any further questions or concerns prior to appointment.

## 2015-09-17 ENCOUNTER — Ambulatory Visit (INDEPENDENT_AMBULATORY_CARE_PROVIDER_SITE_OTHER): Payer: PPO | Admitting: Surgery

## 2015-09-17 ENCOUNTER — Encounter: Payer: Self-pay | Admitting: Surgery

## 2015-09-17 ENCOUNTER — Other Ambulatory Visit
Admission: RE | Admit: 2015-09-17 | Discharge: 2015-09-17 | Disposition: A | Discharge status: Home / Self Care | Payer: PPO | Source: Ambulatory Visit | Attending: Surgery | Admitting: Surgery

## 2015-09-17 VITALS — BP 126/65 | HR 115 | Temp 97.3°F

## 2015-09-17 DIAGNOSIS — K5792 Diverticulitis of intestine, part unspecified, without perforation or abscess without bleeding: Secondary | ICD-10-CM

## 2015-09-17 DIAGNOSIS — R1032 Left lower quadrant pain: Secondary | ICD-10-CM | POA: Insufficient documentation

## 2015-09-17 DIAGNOSIS — K436 Other and unspecified ventral hernia with obstruction, without gangrene: Secondary | ICD-10-CM

## 2015-09-17 LAB — CBC WITH DIFFERENTIAL/PLATELET
BASOS ABS: 0 10*3/uL (ref 0–0.1)
BASOS PCT: 0 %
EOS ABS: 0 10*3/uL (ref 0–0.7)
EOS PCT: 0 %
HCT: 33.2 % — ABNORMAL LOW (ref 40.0–52.0)
Hemoglobin: 10.9 g/dL — ABNORMAL LOW (ref 13.0–18.0)
Lymphocytes Relative: 10 %
Lymphs Abs: 1.5 10*3/uL (ref 1.0–3.6)
MCH: 27.5 pg (ref 26.0–34.0)
MCHC: 32.8 g/dL (ref 32.0–36.0)
MCV: 83.8 fL (ref 80.0–100.0)
MONO ABS: 1.3 10*3/uL — AB (ref 0.2–1.0)
Monocytes Relative: 8 %
Neutro Abs: 13 10*3/uL — ABNORMAL HIGH (ref 1.4–6.5)
Neutrophils Relative %: 82 %
PLATELETS: 294 10*3/uL (ref 150–440)
RBC: 3.97 MIL/uL — ABNORMAL LOW (ref 4.40–5.90)
RDW: 15.8 % — AB (ref 11.5–14.5)
WBC: 15.8 10*3/uL — ABNORMAL HIGH (ref 3.8–10.6)

## 2015-09-17 MED ORDER — METRONIDAZOLE 500 MG PO TABS: 500.0000 mg | ORAL_TABLET | Freq: Three times a day (TID) | ORAL | Status: DC

## 2015-09-17 MED ORDER — CIPROFLOXACIN HCL 500 MG PO TABS: 500.0000 mg | ORAL_TABLET | Freq: Two times a day (BID) | ORAL | Status: DC

## 2015-09-17 NOTE — Progress Notes (Signed)
Outpatient postop visit  09/17/2015  David Morgan is an 82 y.o. male.    Procedure: Repair of incarcerated ventral hernia  CC: Complains of left lower quadrant pain and tenderness since surgery  HPI: Patient status post repair of incarcerated ventral hernia with mesh. He has no appetite and is forcing himself to eat he's had no vomiting, fevers but has had some chills  Medications reviewed.    Physical Exam:  BP 126/65 mmHg  Pulse 115  Temp(Src) 97.3 F (36.3 C) (Oral)    PE: Very weak and very frail pale male patient Abdomen is soft nondistended nontympanitic he is tender in the left lower quadrant with minimal if any guarding but no rebound or percussion tenderness. Wound is healing well without erythema or drainage Calves are nontender He is walker dependent    Assessment/Plan:  Status post repair of incarcerated ventral hernia with mesh by Dr. Azalee Course. He has ongoing diverticulitis and has been seen previously for that. He had a recent diagnostic laparoscopy and washout procedure by Dr. Adonis Huguenin. This patient likely represents someone with continued diverticulitis and in the face of all this had a incarcerated ventral hernia which required emergency repair. Without a mind I believe he warrants a CT scan of the abdomen and pelvis and checking white blood cell count. I will start him empirically on antibiotics.  Florene Glen, MD, FACS

## 2015-09-18 ENCOUNTER — Ambulatory Visit: Payer: PPO

## 2015-09-18 ENCOUNTER — Inpatient Hospital Stay
Admission: EM | Admit: 2015-09-18 | Discharge: 2015-09-21 | DRG: 392 | Disposition: A | Discharge status: Skilled Nursing Facility | Payer: PPO | Attending: Surgery | Admitting: Surgery

## 2015-09-18 ENCOUNTER — Encounter: Payer: Self-pay | Admitting: Emergency Medicine

## 2015-09-18 ENCOUNTER — Emergency Department: Payer: PPO

## 2015-09-18 DIAGNOSIS — Z881 Allergy status to other antibiotic agents status: Secondary | ICD-10-CM

## 2015-09-18 DIAGNOSIS — R531 Weakness: Secondary | ICD-10-CM | POA: Diagnosis not present

## 2015-09-18 DIAGNOSIS — Z8673 Personal history of transient ischemic attack (TIA), and cerebral infarction without residual deficits: Secondary | ICD-10-CM

## 2015-09-18 DIAGNOSIS — Z8719 Personal history of other diseases of the digestive system: Secondary | ICD-10-CM | POA: Diagnosis not present

## 2015-09-18 DIAGNOSIS — K5712 Diverticulitis of small intestine without perforation or abscess without bleeding: Secondary | ICD-10-CM | POA: Diagnosis not present

## 2015-09-18 DIAGNOSIS — R4182 Altered mental status, unspecified: Secondary | ICD-10-CM | POA: Diagnosis not present

## 2015-09-18 DIAGNOSIS — K5780 Diverticulitis of intestine, part unspecified, with perforation and abscess without bleeding: Secondary | ICD-10-CM | POA: Diagnosis present

## 2015-09-18 DIAGNOSIS — Z8551 Personal history of malignant neoplasm of bladder: Secondary | ICD-10-CM

## 2015-09-18 DIAGNOSIS — Z87891 Personal history of nicotine dependence: Secondary | ICD-10-CM

## 2015-09-18 DIAGNOSIS — E039 Hypothyroidism, unspecified: Secondary | ICD-10-CM | POA: Diagnosis present

## 2015-09-18 DIAGNOSIS — I6529 Occlusion and stenosis of unspecified carotid artery: Secondary | ICD-10-CM | POA: Diagnosis not present

## 2015-09-18 DIAGNOSIS — F4489 Other dissociative and conversion disorders: Secondary | ICD-10-CM | POA: Diagnosis not present

## 2015-09-18 DIAGNOSIS — I739 Peripheral vascular disease, unspecified: Secondary | ICD-10-CM | POA: Diagnosis present

## 2015-09-18 DIAGNOSIS — K5720 Diverticulitis of large intestine with perforation and abscess without bleeding: Secondary | ICD-10-CM | POA: Diagnosis not present

## 2015-09-18 DIAGNOSIS — R188 Other ascites: Secondary | ICD-10-CM | POA: Diagnosis not present

## 2015-09-18 DIAGNOSIS — Z96651 Presence of right artificial knee joint: Secondary | ICD-10-CM | POA: Diagnosis not present

## 2015-09-18 DIAGNOSIS — J45909 Unspecified asthma, uncomplicated: Secondary | ICD-10-CM | POA: Diagnosis not present

## 2015-09-18 DIAGNOSIS — I10 Essential (primary) hypertension: Secondary | ICD-10-CM | POA: Diagnosis not present

## 2015-09-18 DIAGNOSIS — I959 Hypotension, unspecified: Secondary | ICD-10-CM | POA: Diagnosis not present

## 2015-09-18 DIAGNOSIS — H1189 Other specified disorders of conjunctiva: Secondary | ICD-10-CM | POA: Diagnosis not present

## 2015-09-18 DIAGNOSIS — Z888 Allergy status to other drugs, medicaments and biological substances status: Secondary | ICD-10-CM

## 2015-09-18 DIAGNOSIS — E785 Hyperlipidemia, unspecified: Secondary | ICD-10-CM | POA: Diagnosis not present

## 2015-09-18 LAB — CBC WITH DIFFERENTIAL/PLATELET
BASOS PCT: 0 %
Basophils Absolute: 0 10*3/uL (ref 0–0.1)
Eosinophils Absolute: 0 10*3/uL (ref 0–0.7)
Eosinophils Relative: 0 %
HEMATOCRIT: 31.5 % — AB (ref 40.0–52.0)
HEMOGLOBIN: 10.4 g/dL — AB (ref 13.0–18.0)
LYMPHS ABS: 1.3 10*3/uL (ref 1.0–3.6)
Lymphocytes Relative: 9 %
MCH: 27.2 pg (ref 26.0–34.0)
MCHC: 32.9 g/dL (ref 32.0–36.0)
MCV: 82.7 fL (ref 80.0–100.0)
MONOS PCT: 8 %
Monocytes Absolute: 1.2 10*3/uL — ABNORMAL HIGH (ref 0.2–1.0)
NEUTROS ABS: 12.4 10*3/uL — AB (ref 1.4–6.5)
NEUTROS PCT: 83 %
Platelets: 288 10*3/uL (ref 150–440)
RBC: 3.81 MIL/uL — ABNORMAL LOW (ref 4.40–5.90)
RDW: 16.3 % — ABNORMAL HIGH (ref 11.5–14.5)
WBC: 14.9 10*3/uL — ABNORMAL HIGH (ref 3.8–10.6)

## 2015-09-18 LAB — URINALYSIS COMPLETE WITH MICROSCOPIC (ARMC ONLY)
BACTERIA UA: NONE SEEN
BILIRUBIN URINE: NEGATIVE
GLUCOSE, UA: NEGATIVE mg/dL
Hgb urine dipstick: NEGATIVE
Ketones, ur: NEGATIVE mg/dL
NITRITE: NEGATIVE
Protein, ur: NEGATIVE mg/dL
Specific Gravity, Urine: 1.008 (ref 1.005–1.030)
pH: 6 (ref 5.0–8.0)

## 2015-09-18 LAB — COMPREHENSIVE METABOLIC PANEL
ALBUMIN: 3.1 g/dL — AB (ref 3.5–5.0)
ALK PHOS: 67 U/L (ref 38–126)
ALT: 18 U/L (ref 17–63)
ANION GAP: 10 (ref 5–15)
AST: 22 U/L (ref 15–41)
BILIRUBIN TOTAL: 1 mg/dL (ref 0.3–1.2)
BUN: 21 mg/dL — AB (ref 6–20)
CALCIUM: 9.1 mg/dL (ref 8.9–10.3)
CO2: 25 mmol/L (ref 22–32)
CREATININE: 0.82 mg/dL (ref 0.61–1.24)
Chloride: 99 mmol/L — ABNORMAL LOW (ref 101–111)
GFR calc Af Amer: >60 mL/min (ref >60)
GFR calc non Af Amer: >60 mL/min (ref >60)
GLUCOSE: 143 mg/dL — AB (ref 65–99)
Potassium: 3.9 mmol/L (ref 3.5–5.1)
Sodium: 134 mmol/L — ABNORMAL LOW (ref 135–145)
TOTAL PROTEIN: 7.7 g/dL (ref 6.5–8.1)

## 2015-09-18 LAB — TROPONIN I: Troponin I: 0.03 ng/mL (ref <0.031)

## 2015-09-18 MED ORDER — IOHEXOL 240 MG/ML SOLN
25.0000 mL | INTRAMUSCULAR | Status: AC
  Administered 2015-09-18: 50 mL via ORAL

## 2015-09-18 MED ORDER — DEXTROSE IN LACTATED RINGERS 5 % IV SOLN
INTRAVENOUS | Status: DC
  Administered 2015-09-18 – 2015-09-21 (×4): via INTRAVENOUS
  Filled 2015-09-18 (×3): qty 1000

## 2015-09-18 MED ORDER — HYDRALAZINE HCL 20 MG/ML IJ SOLN: 10.0000 mg | INTRAMUSCULAR | Status: DC | PRN

## 2015-09-18 MED ORDER — LOSARTAN POTASSIUM 50 MG PO TABS
50.0000 mg | ORAL_TABLET | Freq: Every day | ORAL | Status: DC
  Administered 2015-09-19 – 2015-09-21 (×3): 50 mg via ORAL
  Filled 2015-09-18 (×3): qty 1

## 2015-09-18 MED ORDER — TAMSULOSIN HCL 0.4 MG PO CAPS
0.8000 mg | ORAL_CAPSULE | Freq: Every evening | ORAL | Status: DC
  Administered 2015-09-18 – 2015-09-20 (×3): 0.8 mg via ORAL
  Filled 2015-09-18 (×3): qty 2

## 2015-09-18 MED ORDER — ATORVASTATIN CALCIUM 20 MG PO TABS
40.0000 mg | ORAL_TABLET | Freq: Every evening | ORAL | Status: DC
  Administered 2015-09-18 – 2015-09-20 (×3): 40 mg via ORAL
  Filled 2015-09-18 (×3): qty 2

## 2015-09-18 MED ORDER — LEVOTHYROXINE SODIUM 150 MCG PO TABS
150.0000 ug | ORAL_TABLET | Freq: Every day | ORAL | Status: DC
  Administered 2015-09-19 – 2015-09-21 (×3): 150 ug via ORAL
  Filled 2015-09-18 (×3): qty 1

## 2015-09-18 MED ORDER — SODIUM CHLORIDE 0.9 % IV SOLN: Freq: Once | INTRAVENOUS | Status: DC

## 2015-09-18 MED ORDER — KETOROLAC TROMETHAMINE 30 MG/ML IJ SOLN
INTRAMUSCULAR | Status: AC
Start: 2015-09-18 — End: 2015-09-19
  Filled 2015-09-18: qty 1

## 2015-09-18 MED ORDER — IOHEXOL 300 MG/ML  SOLN
100.0000 mL | Freq: Once | INTRAMUSCULAR | Status: AC | PRN
  Administered 2015-09-18: 100 mL via INTRAVENOUS

## 2015-09-18 MED ORDER — GABAPENTIN 100 MG PO CAPS
100.0000 mg | ORAL_CAPSULE | Freq: Three times a day (TID) | ORAL | Status: DC
  Administered 2015-09-18 – 2015-09-21 (×9): 100 mg via ORAL
  Filled 2015-09-18 (×10): qty 1

## 2015-09-18 MED ORDER — ONDANSETRON 8 MG PO TBDP: 4.0000 mg | ORAL_TABLET | Freq: Four times a day (QID) | ORAL | Status: DC | PRN

## 2015-09-18 MED ORDER — HYDROMORPHONE HCL 1 MG/ML IJ SOLN: 0.5000 mg | INTRAMUSCULAR | Status: DC | PRN

## 2015-09-18 MED ORDER — AMLODIPINE BESYLATE 5 MG PO TABS
2.5000 mg | ORAL_TABLET | Freq: Every morning | ORAL | Status: DC
  Administered 2015-09-19 – 2015-09-21 (×3): 2.5 mg via ORAL
  Filled 2015-09-18 (×3): qty 1

## 2015-09-18 MED ORDER — ACETAMINOPHEN 500 MG PO TABS
1000.0000 mg | ORAL_TABLET | Freq: Four times a day (QID) | ORAL | Status: DC
  Administered 2015-09-18 – 2015-09-21 (×11): 1000 mg via ORAL
  Filled 2015-09-18 (×13): qty 2

## 2015-09-18 MED ORDER — SODIUM CHLORIDE 0.9 % IV SOLN
Freq: Once | INTRAVENOUS | Status: AC
  Administered 2015-09-18: 12:00:00 via INTRAVENOUS

## 2015-09-18 MED ORDER — ACETAMINOPHEN 500 MG PO TABS
ORAL_TABLET | ORAL | Status: AC
  Administered 2015-09-18: 1000 mg via ORAL
  Filled 2015-09-18: qty 2

## 2015-09-18 MED ORDER — PIPERACILLIN-TAZOBACTAM 3.375 G IVPB
3.3750 g | Freq: Once | INTRAVENOUS | Status: AC
  Administered 2015-09-18: 3.375 g via INTRAVENOUS
  Filled 2015-09-18: qty 50

## 2015-09-18 MED ORDER — HEPARIN SODIUM (PORCINE) 5000 UNIT/ML IJ SOLN
5000.0000 [IU] | Freq: Three times a day (TID) | INTRAMUSCULAR | Status: DC
  Administered 2015-09-18 – 2015-09-21 (×9): 5000 [IU] via SUBCUTANEOUS
  Filled 2015-09-18 (×11): qty 1

## 2015-09-18 MED ORDER — CARVEDILOL 6.25 MG PO TABS
3.1250 mg | ORAL_TABLET | Freq: Two times a day (BID) | ORAL | Status: DC
  Administered 2015-09-19 – 2015-09-21 (×5): 3.125 mg via ORAL
  Filled 2015-09-18 (×5): qty 1

## 2015-09-18 MED ORDER — PIPERACILLIN-TAZOBACTAM 3.375 G IVPB
3.3750 g | Freq: Three times a day (TID) | INTRAVENOUS | Status: DC
  Administered 2015-09-18 – 2015-09-21 (×9): 3.375 g via INTRAVENOUS
  Filled 2015-09-18 (×11): qty 50

## 2015-09-18 MED ORDER — ACETAMINOPHEN 325 MG PO TABS
ORAL_TABLET | ORAL | Status: AC
  Filled 2015-09-18: qty 4

## 2015-09-18 MED ORDER — ONDANSETRON HCL 4 MG/2ML IJ SOLN: 4.0000 mg | Freq: Four times a day (QID) | INTRAMUSCULAR | Status: DC | PRN

## 2015-09-18 MED ORDER — KETOROLAC TROMETHAMINE 15 MG/ML IJ SOLN
15.0000 mg | Freq: Four times a day (QID) | INTRAMUSCULAR | Status: DC
  Administered 2015-09-18 – 2015-09-21 (×12): 15 mg via INTRAVENOUS
  Filled 2015-09-18 (×13): qty 1

## 2015-09-18 MED ORDER — LEVOTHYROXINE SODIUM 150 MCG PO TABS: 150.0000 ug | ORAL_TABLET | Freq: Every evening | ORAL | Status: DC

## 2015-09-18 MED ORDER — IOHEXOL 240 MG/ML SOLN: 25.0000 mL | Freq: Once | INTRAMUSCULAR | Status: DC | PRN

## 2015-09-18 MED ORDER — VANCOMYCIN HCL IN DEXTROSE 1-5 GM/200ML-% IV SOLN
1000.0000 mg | Freq: Once | INTRAVENOUS | Status: AC
  Administered 2015-09-18: 1000 mg via INTRAVENOUS
  Filled 2015-09-18: qty 200

## 2015-09-18 NOTE — ED Notes (Signed)
Pt to ed with c/o confusion, and altered mental status, low blood pressure,  Pt sent from dr Kerby Moors office

## 2015-09-18 NOTE — ED Provider Notes (Signed)
[facility] Emergency Department Provider Note     Time seen: ----------------------------------------- 12:05 PM on 09/18/2015 -----------------------------------------    I have reviewed the triage vital signs and the nursing notes.   HISTORY  Chief Complaint Altered Mental Status    HPI LIEL BAUN is a 82 y.o. male who presents ER for confusion, altered mental status and low blood pressure. Patient was sent by his primary care doctor for evaluation concerning same. Patient's felt weak, has had worsening lower abdominal pain. Recently saw general surgery who placed him on Cipro and Flagyl. Patient does have a history of complicated diverticulitis with abscess and percutaneous drain.   Past Medical History  Diagnosis Date  . Lesion of bladder   . History of bladder cancer   . Hypertension   . Hyperlipidemia   . Peripheral vascular disease ([place])   . Claudication ([place])   . Emphysema/COPD ([place])   . Mild asthma     NO INHALER  . Short of breath on exertion   . Hypothyroidism   . Urgency of urination   . Nocturia   . Arthritis   . Wears glasses   . History of TIA (transient ischemic attack)     2012--  NO RESIDUAL (PER SCAN HAD A PREVIOUS TIA BEFORE 2012)  . History of carotid artery stenosis   . Cancer ([place])   . Diverticulitis     Patient Active Problem List   Diagnosis Date Noted  . Incarcerated umbilical hernia [id number]  . SBO (small bowel obstruction) ([place])   . Diverticulitis large intestine 08/06/2015  . Diverticulitis of intestine with perforation without bleeding   . Disease of thyroid gland 07/10/2015  . BP (high blood pressure) 07/10/2015  . HLD (hyperlipidemia) 07/10/2015  . Diverticulitis 06/28/2015  . Benign fibroma of prostate 08/02/2014    Past Surgical History  Procedure Laterality Date  . Transurethral resection of bladder tumor  1990  . Total knee arthroplasty Right 2004  . Carotid endarterectomy Right 2005   . Carpal tunnel release Bilateral 2002  &  2007  . Right shoulder  surgery  2005  . Orif hip fracture Left 2008    RETAINED HARDWARE  . Umbilical hernia repair   2009  &  2011  . Inguinal hernia repair  YRS AGO  . Cataract extraction w/ intraocular lens  implant, bilateral    . Cystoscopy with biopsy N/A 12/26/2013    Procedure: CYSTOSCOPY WITH BLADDER BIOPSY;  Surgeon: Claybon Jabs, MD;  Location[facility];  Service: Urology;  Laterality: N/A;  . Cystoscopy w/ retrogrades Bilateral 12/26/2013    Procedure: BILATERAL RETROGRADE PYELOGRAM;  Surgeon: Claybon Jabs, MD;  Location: [facility];  Service: Urology;  Laterality: Bilateral;  . Laparoscopy  06/28/2015    Procedure: LAPAROSCOPY DIAGNOSTIC;  Surgeon: Clayburn Pert, MD;  Location: [facility];  Service: General;;  . Laparoscopic lysis of adhesions  06/28/2015    Procedure: LAPAROSCOPIC LYSIS OF ADHESIONS;  Surgeon: Clayburn Pert, MD;  Location: [facility];  Service: General;;  . Laparotomy N/A 09/03/2015    Procedure: umbilical hernia repair with mesh;  Surgeon: Hubbard Robinson, MD;  Location: [facility];  Service: General;  Laterality: N/A;    Allergies Adhesive and Augmentin  Social History Social History  Substance Use Topics  . Smoking status: Former Smoker -- 1.00 packs/day for 40 years    Types: Cigarettes    Quit date: 12/21/1993  . Smokeless tobacco: Never Used  .  Alcohol Use: No     Comment: RARE    Review of Systems Constitutional: Negative for fever. Eyes: Negative for visual changes. ENT: Negative for sore throat. Cardiovascular: Negative for chest pain. Respiratory: Negative for shortness of breath. Gastrointestinal: Positive for abdominal pain, diarrhea Genitourinary: Negative for dysuria. Musculoskeletal: Negative for back pain. Skin: Negative for rash. Neurological: Negative for headaches, positive for weakness  10-point ROS otherwise  negative.  ____________________________________________   PHYSICAL EXAM:  VITAL SIGNS: ED Triage Vitals  Enc Vitals Group     BP 09/18/15 1049 124/51 mmHg     Pulse Rate 09/18/15 1049 87     Resp 09/18/15 1049 18     Temp 09/18/15 1049 98.1 F (36.7 C)     Temp Source 09/18/15 1049 Oral     SpO2 09/18/15 1049 100 %     Weight 09/18/15 1049 200 lb (90.719 kg)     Height 09/18/15 1049 5\' 10"  (1.778 m)     Head Cir --      Peak Flow --      Pain Score 09/18/15 1041 0     Pain Loc --      Pain Edu? --      Excl. in [place]? --     Constitutional: Alert and oriented. Generally ill-appearing, no acute distress. Eyes: Conjunctivae are normal. PERRL. Normal extraocular movements. ENT   Head: Normocephalic and atraumatic.   Nose: No congestion/rhinnorhea.   Mouth/Throat: Mucous membranes are moist.   Neck: No stridor. Cardiovascular: Normal rate, regular rhythm. Normal and symmetric distal pulses are present in all extremities. No murmurs, rubs, or gallops. Respiratory: Normal respiratory effort without tachypnea nor retractions. Breath sounds are clear and equal bilaterally. No wheezes/rales/rhonchi. Gastrointestinal: Significant tenderness in the left lower quadrant, no rebound or guarding. Normal bowel sounds. Musculoskeletal: Nontender with normal range of motion in all extremities. No joint effusions.  No lower extremity tenderness nor edema. Neurologic:  Normal speech and language. No gross focal neurologic deficits are appreciated. Speech is normal. No gait instability. Skin:  Skin is warm, dry and intact. No rash noted. Psychiatric: Mood and affect are normal. Speech and behavior are normal. Patient exhibits appropriate insight and judgment. ____________________________________________  EKG: Interpreted by me. Normal sinus rhythm with a rate of 88 bpm, normal PR interval, normal QRS, normal QT interval. Normal  axis.  ____________________________________________  ED COURSE:  Pertinent labs & imaging results that were available during my care of the patient were reviewed by me and considered in my medical decision making (see chart for details). Patient with likely recurrent diverticulitis and dehydration. ____________________________________________    LABS (pertinent positives/negatives)  Labs Reviewed  CBC WITH DIFFERENTIAL/PLATELET - Abnormal; Notable for the following:    WBC 14.9 (*)    RBC 3.81 (*)    Hemoglobin 10.4 (*)    HCT 31.5 (*)    RDW 16.3 (*)    Neutro Abs 12.4 (*)    Monocytes Absolute 1.2 (*)    All other components within normal limits  COMPREHENSIVE METABOLIC PANEL - Abnormal; Notable for the following:    Sodium 134 (*)    Chloride 99 (*)    Glucose, Bld 143 (*)    BUN 21 (*)    Albumin 3.1 (*)    All other components within normal limits  URINALYSIS COMPLETEWITH MICROSCOPIC ([facility] ONLY) - Abnormal; Notable for the following:    Color, Urine YELLOW (*)    APPearance CLEAR (*)    Leukocytes, UA TRACE (*)  Squamous Epithelial / LPF 0-5 (*)    All other components within normal limits  TROPONIN I    RADIOLOGY Images were viewed by me   Ct abd/pelvis IMPRESSION: 1. There is a fluid collection in left lower quadrant close proximity with proximal sigmoid colon just anterior to sigmoid colon with air-fluid level measures at least 7.5 x 7.5 cm. This is highly suspicious for pericolonic abscess probable from contained perforated diverticulitis. Please note the collection has a extension with a second pocket in left lower quadrant axial image 52 just anterior to descending colon. Measures about 2.5 cm 2. There is mild asymmetric thickening of the leg rectus muscle inferiorly. Findings suspicious for rectus muscle hematoma or myositis. Tiny high-density material inferior aspect of the left rectus muscle suspicious for hemorrhage/extravasation. 3. No  evidence of small bowel obstruction. There are mild distended small bowel loops with air-fluid level probable ileus. Contrast material is noted in right colon and cecum. No pericecal inflammation. Normal appendix. 4. Postsurgical changes post abdominal wall hernia repair in umbilical region. No evidence of recurrent hernia. There is small amount of fluid and probable small subcutaneous edema probable postsurgical in nature in periumbilical region please see axial image 53. No evidence of abdominal wall abscess. 5. Atherosclerotic calcifications of abdominal aorta and iliac arteries. 6. Degenerative changes as described above. 7. Small layering gallstones are noted within gallbladder. No pericholecystic fluid. These results were called by telephone at the time of interpretation on 09/18/2015 at 2:40 pm to Dr. Lenise Arena , who verbally acknowledged these results.  ____________________________________________  FINAL ASSESSMENT AND PLAN  Pericolonic abscess  Plan: Patient with labs and imaging as dictated above. I ordered broad-spectrum antibiotics for what appears to be a large diverticular abscess. A consult to Dr. Heath Lark to evaluate the patient the ER. Currently his vital signs are stable and he denies significant pain at this time. He will likely need percutaneous drainage of this fluid. No signs of bowel obstruction.   Earleen Newport, MD   Earleen Newport, MD 09/18/15 [phone]

## 2015-09-18 NOTE — H&P (Signed)
David Morgan is an 82 y.o. male.   Chief Complaint: abdominal pain HPI: 82 year old male with abdominal pain for about 5 days ago. Patient is well-known to the surgical service. He had a bout of diverticulitis back in December with abscess that had to be draining laparoscopically. Patient then came back in approximately 2 weeks ago with an incarcerated umbilical hernia that had to be repaired emergently. Patient states that soon after that surgery he began having pain again. He is unable to delineate the pain from postoperatively to the pain in the left lower quadrant states that the pain seems to have been constant since November. Patient states that it did get better about the past 5 days, continues to have diarrhea-like stools, does stools with these had some chills but has not taken his temperature is seen if there is a fever.  Patient was seen by my partner Dr. Burt Knack yesterday in the clinic who ordered his CT scan and started him on Cipro and Flagyl at that time. Patient denies any dysuria or hematuria and he is able to urinate without any difficulties.  Patient has been weeks since November and has continued to lose weight and the clot and functional status. He states that the pain is in the left lower quadrant and remains there that the pain medicine is not making her better this time.  Past Medical History  Diagnosis Date  . Lesion of bladder   . History of bladder cancer   . Hypertension   . Hyperlipidemia   . Peripheral vascular disease ([place])   . Claudication ([place])   . Emphysema/COPD ([place])   . Mild asthma     NO INHALER  . Short of breath on exertion   . Hypothyroidism   . Urgency of urination   . Nocturia   . Arthritis   . Wears glasses   . History of TIA (transient ischemic attack)     2012--  NO RESIDUAL (PER SCAN HAD A PREVIOUS TIA BEFORE 2012)  . History of carotid artery stenosis   . Cancer ([place])   . Diverticulitis     Past Surgical History  Procedure Laterality  Date  . Transurethral resection of bladder tumor  1990  . Total knee arthroplasty Right 2004  . Carotid endarterectomy Right 2005  . Carpal tunnel release Bilateral 2002  &  2007  . Right shoulder  surgery  2005  . Orif hip fracture Left 2008    RETAINED HARDWARE  . Umbilical hernia repair   2009  &  2011  . Inguinal hernia repair  YRS AGO  . Cataract extraction w/ intraocular lens  implant, bilateral    . Cystoscopy with biopsy N/A 12/26/2013    Procedure: CYSTOSCOPY WITH BLADDER BIOPSY;  Surgeon: Claybon Jabs, MD;  Location: [facility];  Service: Urology;  Laterality: N/A;  . Cystoscopy w/ retrogrades Bilateral 12/26/2013    Procedure: BILATERAL RETROGRADE PYELOGRAM;  Surgeon: Claybon Jabs, MD;  Location: [facility];  Service: Urology;  Laterality: Bilateral;  . Laparoscopy  06/28/2015    Procedure: LAPAROSCOPY DIAGNOSTIC;  Surgeon: Clayburn Pert, MD;  Location: [facility];  Service: General;;  . Laparoscopic lysis of adhesions  06/28/2015    Procedure: LAPAROSCOPIC LYSIS OF ADHESIONS;  Surgeon: Clayburn Pert, MD;  Location: [facility];  Service: General;;  . Laparotomy N/A 09/03/2015    Procedure: umbilical hernia repair with mesh;  Surgeon: Hubbard Robinson, MD;  Location: [facility];  Service:  General;  Laterality: N/A;    Family History  Problem Relation Age of Onset  . Hypertension Father    Social History:  reports that he quit smoking about 21 years ago. His smoking use included Cigarettes. He has a 40 pack-year smoking history. He has never used smokeless tobacco. He reports that he does not drink alcohol or use illicit drugs.  Allergies:  Allergies  Allergen Reactions  . Adhesive [Tape] Rash  . Augmentin [Amoxicillin-Pot Clavulanate] Itching, Rash and Other (See Comments)    Has patient had a PCN reaction causing immediate rash, facial/tongue/throat swelling, SOB or lightheadedness with hypotension: No Has patient had a PCN reaction  causing severe rash involving mucus membranes or skin necrosis: No Has patient had a PCN reaction that required hospitalization No Has patient had a PCN reaction occurring within the last 10 years: No If all of the above answers are "NO", then may proceed with Cephalosporin use.      (Not in a hospital admission)  Results for orders placed or performed during the hospital encounter of 09/18/15 (from the past 48 hour(s))  CBC with Differential/Platelet     Status: Abnormal   Collection Time: 09/18/15 12:13 PM  Result Value Ref Range   WBC 14.9 (H) 3.8 - 10.6 K/uL   RBC 3.81 (L) 4.40 - 5.90 MIL/uL   Hemoglobin 10.4 (L) 13.0 - 18.0 g/dL   HCT 31.5 (L) 40.0 - 52.0 %   MCV 82.7 80.0 - 100.0 fL   MCH 27.2 26.0 - 34.0 pg   MCHC 32.9 32.0 - 36.0 g/dL   RDW 16.3 (H) 11.5 - 14.5 %   Platelets 288 150 - 440 K/uL   Neutrophils Relative % 83 %   Neutro Abs 12.4 (H) 1.4 - 6.5 K/uL   Lymphocytes Relative 9 %   Lymphs Abs 1.3 1.0 - 3.6 K/uL   Monocytes Relative 8 %   Monocytes Absolute 1.2 (H) 0.2 - 1.0 K/uL   Eosinophils Relative 0 %   Eosinophils Absolute 0.0 0 - 0.7 K/uL   Basophils Relative 0 %   Basophils Absolute 0.0 0 - 0.1 K/uL  Comprehensive metabolic panel     Status: Abnormal   Collection Time: 09/18/15 12:13 PM  Result Value Ref Range   Sodium 134 (L) 135 - 145 mmol/L   Potassium 3.9 3.5 - 5.1 mmol/L   Chloride 99 (L) 101 - 111 mmol/L   CO2 25 22 - 32 mmol/L   Glucose, Bld 143 (H) 65 - 99 mg/dL   BUN 21 (H) 6 - 20 mg/dL   Creatinine, Ser 0.82 0.61 - 1.24 mg/dL   Calcium 9.1 8.9 - 10.3 mg/dL   Total Protein 7.7 6.5 - 8.1 g/dL   Albumin 3.1 (L) 3.5 - 5.0 g/dL   AST 22 15 - 41 U/L   ALT 18 17 - 63 U/L   Alkaline Phosphatase 67 38 - 126 U/L   Total Bilirubin 1.0 0.3 - 1.2 mg/dL   GFR calc non Af Amer >60 >60 mL/min   GFR calc Af Amer >60 >60 mL/min    Comment: (NOTE) The eGFR has been calculated using the CKD EPI equation. This calculation has not been validated in all  clinical situations. eGFR's persistently <60 mL/min signify possible Chronic Kidney Disease.    Anion gap 10 5 - 15  Troponin I     Status: None   Collection Time: 09/18/15 12:13 PM  Result Value Ref Range   Troponin I 0.03 <0.031  ng/mL    Comment:        NO INDICATION OF MYOCARDIAL INJURY.   Urinalysis complete, with microscopic ([facility] only)     Status: Abnormal   Collection Time: 09/18/15  1:33 PM  Result Value Ref Range   Color, Urine YELLOW (A) YELLOW   APPearance CLEAR (A) CLEAR   Glucose, UA NEGATIVE NEGATIVE mg/dL   Bilirubin Urine NEGATIVE NEGATIVE   Ketones, ur NEGATIVE NEGATIVE mg/dL   Specific Gravity, Urine 1.008 1.005 - 1.030   Hgb urine dipstick NEGATIVE NEGATIVE   pH 6.0 5.0 - 8.0   Protein, ur NEGATIVE NEGATIVE mg/dL   Nitrite NEGATIVE NEGATIVE   Leukocytes, UA TRACE (A) NEGATIVE   RBC / HPF 0-5 0 - 5 RBC/hpf   WBC, UA 0-5 0 - 5 WBC/hpf   Bacteria, UA NONE SEEN NONE SEEN   Squamous Epithelial / LPF 0-5 (A) NONE SEEN   Ct Abdomen Pelvis W Contrast  09/18/2015  CLINICAL DATA:  Status post hernia repair for incarcerated hernia, fever, chills abdominal pain, leukocytosis, diverticulitis EXAM: CT ABDOMEN AND PELVIS WITH CONTRAST TECHNIQUE: Multidetector CT imaging of the abdomen and pelvis was performed using the standard protocol following bolus administration of intravenous contrast. CONTRAST:  159m OMNIPAQUE IOHEXOL 300 MG/ML  SOLN COMPARISON:  09/03/2015 FINDINGS: The lung bases are unremarkable. Sagittal images of the spine shows degenerative changes thoracolumbar spine. Prior vertebroplasty at L3 level again noted. Small hiatal hernia. There is no evidence of gastric outlet obstruction. Small layering gallstones are noted within gallbladder the largest measures 4 mm. No focal hepatic mass. A small cyst is noted inferior aspect of right hepatic lobe measures 6 mm. The pancreas, spleen and adrenal glands are unremarkable. Kidneys are symmetrical in size and  enhancement. No hydronephrosis or hydroureter. Small right renal cysts are stable from prior exam. There are mild distended small bowel loops with air-fluid levels in upper and mid abdomen without evidence of small bowel obstruction. There is no transition point in caliber of small bowel. Contrast material is noted within right colon. No pericecal inflammation. Normal appendix is noted There are postsurgical changes post hernia repair in periumbilical region. Small subcutaneous edema noted at the surgery site. There is no evidence of recurrent hernia or abdominal wall abscess. Contrast materials and some gas noted in transverse colon. There is some contrast material and proximal left colon. Multiple sigmoid colon diverticula are noted. In axial image 60 there is a fluid collection in left lower quadrant just anterior to sigmoid colon measures 7.5 x 7.5 cm. This is highly suspicious for pericolonic abscess probable contained perforated diverticulitis. The distal sigmoid colon is empty. Some colonic stool and gas noted within sigmoid colon. Prostate gland calcifications are noted. Please note there is mild asymmetric thickening of left rectus muscle just anterior to abdominal abscess. Measures about 1.9 cm in thickness. Myositis or subtle intramuscular hematoma cannot be excluded. Clinical correlation is necessary. In axial image 63 there is some linear high density material inferior aspect of the left rectus muscle therefore subtle hemorrhage/extravasation is suspected. The urinary bladder is unremarkable. There is no inguinal adenopathy. Degenerative changes are noted bilateral SI joints. Metallic fixation pin in an rod are noted in proximal left femur. Degenerative changes pubic symphysis. The left lower quadrant collection is extending laterally in left lower quadrant just anterior to distal left colon axial image 50. Measures about 2.5 cm. Delayed renal images shows bilateral renal symmetrical excretion.  Bilateral visualized proximal ureter is unremarkable. IMPRESSION: 1. There  is a fluid collection in left lower quadrant close proximity with proximal sigmoid colon just anterior to sigmoid colon with air-fluid level measures at least 7.5 x 7.5 cm. This is highly suspicious for pericolonic abscess probable from contained perforated diverticulitis. Please note the collection has a extension with a second pocket in left lower quadrant axial image 52 just anterior to descending colon. Measures about 2.5 cm 2. There is mild asymmetric thickening of the leg rectus muscle inferiorly. Findings suspicious for rectus muscle hematoma or myositis. Tiny high-density material inferior aspect of the left rectus muscle suspicious for hemorrhage/extravasation. 3. No evidence of small bowel obstruction. There are mild distended small bowel loops with air-fluid level probable ileus. Contrast material is noted in right colon and cecum. No pericecal inflammation. Normal appendix. 4. Postsurgical changes post abdominal wall hernia repair in umbilical region. No evidence of recurrent hernia. There is small amount of fluid and probable small subcutaneous edema probable postsurgical in nature in periumbilical region please see axial image 53. No evidence of abdominal wall abscess. 5. Atherosclerotic calcifications of abdominal aorta and iliac arteries. 6. Degenerative changes as described above. 7. Small layering gallstones are noted within gallbladder. No pericholecystic fluid. These results were called by telephone at the time of interpretation on 09/18/2015 at 2:40 pm to Dr. Lenise Arena , who verbally acknowledged these results. Electronically Signed   By: Lahoma Crocker M.D.   On: 09/18/2015 14:40    Review of Systems  Constitutional: Negative for fever and chills.  HENT: Negative for congestion.   Respiratory: Negative for cough and sputum production.   Cardiovascular: Negative for chest pain and leg swelling.   Gastrointestinal: Positive for nausea, abdominal pain, diarrhea and blood in stool. Negative for vomiting.  Genitourinary: Negative for dysuria, frequency and hematuria.  Musculoskeletal: Positive for back pain. Negative for falls.  Skin: Negative for itching and rash.  Neurological: Negative for dizziness, loss of consciousness, weakness and headaches.  Psychiatric/Behavioral: The patient is not nervous/anxious.   All other systems reviewed and are negative.   Blood pressure 128/59, pulse 89, temperature 98.1 F (36.7 C), temperature source Oral, resp. rate 18, height _0  (1.778 m), weight 200 lb (90.719 kg), SpO2 95 %. Physical Exam  Vitals reviewed. Constitutional: He is oriented to person, place, and time. He appears well-developed and well-nourished. No distress.  HENT:  Head: Normocephalic and atraumatic.  Right Ear: External ear normal.  Left Ear: External ear normal.  Nose: Nose normal.  Mouth/Throat: Oropharynx is clear and moist. No oropharyngeal exudate.  Eyes: Conjunctivae and EOM are normal. Pupils are equal, round, and reactive to light. No scleral icterus.  Neck: Normal range of motion. Neck supple. No tracheal deviation present.  Cardiovascular: Normal rate, regular rhythm, normal heart sounds and intact distal pulses.  Exam reveals no gallop and no friction rub.   No murmur heard. Respiratory: Effort normal and breath sounds normal. No respiratory distress. He has no wheezes.  GI: Soft. He exhibits distension. There is tenderness. There is no rebound and no guarding.  Moderately tender in the LLQ, umbilical incision healing well, distended  Musculoskeletal: He exhibits no edema or tenderness.  Neurological: He is alert and oriented to person, place, and time. No cranial nerve deficit.  Skin: Skin is warm and dry. No rash noted. No erythema. No pallor.  Psychiatric: He has a normal mood and affect. His behavior is normal. Judgment and thought content normal.      Assessment/Plan 82 yr old with acute diverticulitis  with pelvic abscess.  I have personally reviewed his past medical history including his hospital stays in November and from 2 weeks ago. Personally reviewed his laboratory values showing a white blood cell count. I have personally reviewed his CT scan images which revealed a large abscess in the left lower quadrant with diverticulitis. And I have reviewed the radiology reads as above.  I discussed with the family of this area does seem amenable to percutaneous drainage and will see if the interventional radiologist and drainage tomorrow. Otherwise we'll keep him nothing by mouth with IV fluids and IV antibiotics until that time. We will again attempt to get him through his diverticulitis with bowel from operating at all possible given his age and other comorbidities.    Lavona Mound Symphany Fleissner 09/18/2015, 6:17 PM

## 2015-09-18 NOTE — Progress Notes (Signed)
LCSW met with patient and family. Patient will be admitted. In consultation the family will continue to provide care in their home. His wife went on to explain they have bedside commode,handicap bars, walker, and everything at home is set up to support husband and herself. Strong ties to their community and their church and respite is  Offered to them when they need it. No further needs at this time.  [employer] [phone]

## 2015-09-19 ENCOUNTER — Encounter: Payer: Self-pay | Admitting: Radiology

## 2015-09-19 ENCOUNTER — Inpatient Hospital Stay: Payer: PPO

## 2015-09-19 DIAGNOSIS — K5780 Diverticulitis of intestine, part unspecified, with perforation and abscess without bleeding: Secondary | ICD-10-CM | POA: Diagnosis not present

## 2015-09-19 LAB — BASIC METABOLIC PANEL
ANION GAP: 4 — AB (ref 5–15)
BUN: 16 mg/dL (ref 6–20)
CALCIUM: 8.1 mg/dL — AB (ref 8.9–10.3)
CO2: 24 mmol/L (ref 22–32)
Chloride: 106 mmol/L (ref 101–111)
Creatinine, Ser: 0.72 mg/dL (ref 0.61–1.24)
Glucose, Bld: 131 mg/dL — ABNORMAL HIGH (ref 65–99)
POTASSIUM: 3.1 mmol/L — AB (ref 3.5–5.1)
Sodium: 134 mmol/L — ABNORMAL LOW (ref 135–145)

## 2015-09-19 LAB — CBC
HCT: 29.9 % — ABNORMAL LOW (ref 40.0–52.0)
HEMOGLOBIN: 9.8 g/dL — AB (ref 13.0–18.0)
MCH: 27.7 pg (ref 26.0–34.0)
MCHC: 32.9 g/dL (ref 32.0–36.0)
MCV: 84.3 fL (ref 80.0–100.0)
Platelets: 251 10*3/uL (ref 150–440)
RBC: 3.55 MIL/uL — AB (ref 4.40–5.90)
RDW: 16 % — ABNORMAL HIGH (ref 11.5–14.5)
WBC: 11.6 10*3/uL — ABNORMAL HIGH (ref 3.8–10.6)

## 2015-09-19 LAB — PROTIME-INR
INR: 1.32
PROTHROMBIN TIME: 16.5 s — AB (ref 11.4–15.0)

## 2015-09-19 LAB — APTT: aPTT: 39 seconds — ABNORMAL HIGH (ref 24–36)

## 2015-09-19 MED ORDER — MIDAZOLAM HCL 5 MG/5ML IJ SOLN
INTRAMUSCULAR | Status: AC
  Filled 2015-09-19: qty 5

## 2015-09-19 MED ORDER — ENSURE ENLIVE PO LIQD
237.0000 mL | Freq: Three times a day (TID) | ORAL | Status: DC
  Administered 2015-09-20 – 2015-09-21 (×4): 237 mL via ORAL

## 2015-09-19 MED ORDER — FENTANYL CITRATE (PF) 100 MCG/2ML IJ SOLN
INTRAMUSCULAR | Status: AC
  Filled 2015-09-19: qty 4

## 2015-09-19 NOTE — Procedures (Signed)
CT guided abscess drainage with 0000000 tube  Complications:  None  Blood Loss: none  See dictation in canopy pacs

## 2015-09-19 NOTE — Care Management (Signed)
This is patient's fourth admission since November.  With each episode of illness patient's functional status has declined to the point that now he can barely stand.  His baseline before these episodes of illness, patient was ambulating independently with his walker and able to perform his own personal care.  He is admitted 1/31 with acute diverticulitis and abscess.  had CT guided abscess drainage performed 2/1.  Spoke with Dr  Azalee Course about concern of need for skilled nursing placement at discharge per patient and his wife.  Discussed that patient's insurance will require prior auth and will be in need of physical therapy consult as this will be needed for the insurance approval.  She relays that at present,  patient would not be able to participate with physical therapy and will enter the order at the time when patient will be able to participate.

## 2015-09-19 NOTE — Progress Notes (Signed)
82yr old with recurrent diverticulitis and abscess. He had it drained with IR today, putting out >200cc of purulent fluid since placed.  Patient states he is feeling a little better. He denies hunger but stated he would try to take some liquids in.   Filed Vitals:   09/19/15 1148 09/19/15 1303  BP: 129/54 109/51  Pulse: 62 66  Temp: 97.9 F (36.6 C) 98.2 F (36.8 C)  Resp: 18 15   I/O last 3 completed shifts: In: 510 [I.V.:510] Out: 86 [Urine:450; Drains:160]     PE:  Gen: NAD Abd: soft, tender in LLQ, JP drain with purulent fluid Ext: 2+ pulses, no edema  CBC Latest Ref Rng 09/19/2015 09/18/2015 09/17/2015  WBC 3.8 - 10.6 K/uL 11.6(H) 14.9(H) 15.8(H)  Hemoglobin 13.0 - 18.0 g/dL 9.8(L) 10.4(L) 10.9(L)  Hematocrit 40.0 - 52.0 % 29.9(L) 31.5(L) 33.2(L)  Platelets 150 - 440 K/uL 251 288 294   CMP Latest Ref Rng 09/19/2015 09/18/2015 09/04/2015  Glucose 65 - 99 mg/dL 131(H) 143(H) 166(H)  BUN 6 - 20 mg/dL 16 21(H) 18  Creatinine 0.61 - 1.24 mg/dL 0.72 0.82 0.81  Sodium 135 - 145 mmol/L 134(L) 134(L) 140  Potassium 3.5 - 5.1 mmol/L 3.1(L) 3.9 3.3(L)  Chloride 101 - 111 mmol/L 106 99(L) 102  CO2 22 - 32 mmol/L 24 25 29   Calcium 8.9 - 10.3 mg/dL 8.1(L) 9.1 8.1(L)  Total Protein 6.5 - 8.1 g/dL - 7.7 -  Total Bilirubin 0.3 - 1.2 mg/dL - 1.0 -  Alkaline Phos 38 - 126 U/L - 67 -  AST 15 - 41 U/L - 22 -  ALT 17 - 63 U/L - 18 -    A/P: 82 yr old male with recurrent diverticulitis with abscess.   Continue Zosyn IV  IR drain placed today Clear liquids and ensure  Continue IV pain medication  PT consult for deconditioning.

## 2015-09-20 NOTE — Progress Notes (Signed)
82yr old with recurrent diverticulitis and abscess. Patient feeling better today.  States still some pain in the LLQ.  He had a BM earlier today.  He was able to drink some ensure.   Filed Vitals:   09/20/15 1007 09/20/15 1233  BP: 154/62 125/63  Pulse: 60 64  Temp:  97.8 F (36.6 C)  Resp:  19   I/O last 3 completed shifts: In: 2124 [I.V.:2066; IV Piggyback:58] Out: 64 [Urine:600; Drains:160] Total I/O In: 2080 [P.O.:2080] Out: 550 [Urine:550]   PE:  Gen: NAD Abd: soft, tender in LLQ, JP drain with purulent fluid 160cc Ext: 2+ pulses, no edema  CBC Latest Ref Rng 09/19/2015 09/18/2015 09/17/2015  WBC 3.8 - 10.6 K/uL 11.6(H) 14.9(H) 15.8(H)  Hemoglobin 13.0 - 18.0 g/dL 9.8(L) 10.4(L) 10.9(L)  Hematocrit 40.0 - 52.0 % 29.9(L) 31.5(L) 33.2(L)  Platelets 150 - 440 K/uL 251 288 294   CMP Latest Ref Rng 09/19/2015 09/18/2015 09/04/2015  Glucose 65 - 99 mg/dL 131(H) 143(H) 166(H)  BUN 6 - 20 mg/dL 16 21(H) 18  Creatinine 0.61 - 1.24 mg/dL 0.72 0.82 0.81  Sodium 135 - 145 mmol/L 134(L) 134(L) 140  Potassium 3.5 - 5.1 mmol/L 3.1(L) 3.9 3.3(L)  Chloride 101 - 111 mmol/L 106 99(L) 102  CO2 22 - 32 mmol/L 24 25 29   Calcium 8.9 - 10.3 mg/dL 8.1(L) 9.1 8.1(L)  Total Protein 6.5 - 8.1 g/dL - 7.7 -  Total Bilirubin 0.3 - 1.2 mg/dL - 1.0 -  Alkaline Phos 38 - 126 U/L - 67 -  AST 15 - 41 U/L - 22 -  ALT 17 - 63 U/L - 18 -    A/P: 82 yr old male with recurrent diverticulitis with abscess.   Continue Zosyn IV  Continue JP drain Advance to regular diet with ensure Continue IV pain medication  PT recommending SNF placement

## 2015-09-20 NOTE — Care Management Important Message (Signed)
Important Message  Patient Details  Name: David Morgan MRN: [id number] Date of Birth: [birth year]   Medicare Important Message Given:  Yes    Juliann Pulse A Tashon Capp 09/20/2015, 10:17 AM

## 2015-09-20 NOTE — Evaluation (Signed)
Physical Therapy Evaluation Patient Details Name: David Morgan MRN: [id number] DOB: [birth year] Today's Date: 09/20/2015   History of Present Illness  82 yo M presented to ER with 5 day abdominal pain with diagnosis of diverticulitis and abdominal abcess.  Pt received JP drain on 09/19/15 to LLQ. He has had multiple recent hospitalizations due to similar symptoms of diverticulitis and abdominal pain.  Clinical Impression  Pt presents with generalized weakness and deficits of functional mobility, requiring +1 A for transfers and ambulation (60 ft). Currently pt requires FWW for transfers and ambulation. He becomes SOB with exertion with SpO2 WFL.  JP drain located at LLQ. Pt will benefit from skilled PT services to increase functional I and mobility for returning to PLOF.     Follow Up Recommendations SNF    Equipment Recommendations       Recommendations for Other Services       Precautions / Restrictions Precautions Precautions: Fall Precaution Comments: JP drain Restrictions Weight Bearing Restrictions: No      Mobility  Bed Mobility               General bed mobility comments: Pt up in recliner.  Transfers Overall transfer level: Needs assistance Equipment used: Rolling walker (2 wheeled) Transfers: Sit to/from [facility] Sit to Stand: Min assist Stand pivot transfers: Min assist       General transfer comment: VC for safe technique including hand placement.  Ambulation/Gait Ambulation/Gait assistance: Min assist Ambulation Distance (Feet): 60 Feet Assistive device: Rolling walker (2 wheeled) Gait Pattern/deviations: Step-through pattern     General Gait Details: Pt unsteady and requires frequent VC for staying close to [facility]  and for safety during turns.  Stairs            Wheelchair Mobility    Modified Rankin (Stroke Patients Only)       Balance Overall balance assessment: Needs assistance Sitting-balance support: Feet  supported Sitting balance-Leahy Scale: Fair     Standing balance support: Bilateral upper extremity supported Standing balance-Leahy Scale: Fair                               Pertinent Vitals/Pain Pain Assessment: 0-10 Pain Score: 4  Pain Location: abdomin Pain Descriptors / Indicators: Aching Pain Intervention(s): Monitored during session    Home Living Family/patient expects to be discharged to:: [place]: Spouse/significant other   Type of Home: House Home Access: Ramped entrance     Home Layout: One level Home Equipment: Environmental consultant - 2 wheels;Walker - 4 wheels;Bedside commode      Prior Function Level of Independence: Independent with assistive device(s)         Comments: limited ambulation of household distances.     Hand Dominance        Extremity/Trunk Assessment            B UE grossly WFL.   Lower Extremity Assessment:  (B LE hip, knee, and ankles grossly 4/5 with difficulty testing knee extension due to lower leg tenderness.)         Communication   Communication: HOH  Cognition Arousal/Alertness: Awake/alert Behavior During Therapy: WFL for tasks assessed/performed Overall Cognitive Status: Within Functional Limits for tasks assessed                      General Comments General comments (skin integrity, edema, etc.): JP drain LLQ    Exercises  Other Exercises Other Exercises: Seated B LE therex: march, LAQ, hip add squeezes, hip abd and ankle pumps x10 each with frequent VC for correct technique.  Monitored pt throughout. No c/o pain. Instructed pt to perform therex for HEP. (8 minutes to complete)      Assessment/Plan    PT Assessment Patient needs continued PT services  PT Diagnosis Generalized weakness;Difficulty walking   PT Problem List Decreased strength;Decreased activity tolerance;Decreased balance;Decreased mobility;Decreased safety awareness  PT Treatment Interventions  DME instruction;Gait training;Functional mobility training;Therapeutic activities;Therapeutic exercise;Balance training;Patient/family education   PT Goals (Current goals can be found in the Care Plan section) Acute Rehab PT Goals Patient Stated Goal: To do what I can. PT Goal Formulation: With patient/family Time For Goal Achievement: 10/04/15 Potential to Achieve Goals: Fair    Frequency Min 2X/week   Barriers to discharge Decreased caregiver support Pt requires hands on A for mobility.    Co-evaluation               End of Session Equipment Utilized During Treatment: Gait belt Activity Tolerance: Patient limited by fatigue Patient left: in chair;with chair alarm set;with family/visitor present;with call bell/phone within reach           Time: 1545-1610 PT Time Calculation (min) (ACUTE ONLY): 25 min   Charges:   PT Evaluation $PT Eval Moderate Complexity: 1 Procedure PT Treatments $Therapeutic Exercise: 8-22 mins   PT G Codes:       Neoma Laming, PT, DPT  09/20/2015, 5:07 PM [phone]

## 2015-09-21 DIAGNOSIS — Z8551 Personal history of malignant neoplasm of bladder: Secondary | ICD-10-CM | POA: Diagnosis not present

## 2015-09-21 DIAGNOSIS — E785 Hyperlipidemia, unspecified: Secondary | ICD-10-CM | POA: Diagnosis not present

## 2015-09-21 DIAGNOSIS — R351 Nocturia: Secondary | ICD-10-CM | POA: Diagnosis not present

## 2015-09-21 DIAGNOSIS — Z96651 Presence of right artificial knee joint: Secondary | ICD-10-CM | POA: Diagnosis not present

## 2015-09-21 DIAGNOSIS — R600 Localized edema: Secondary | ICD-10-CM | POA: Diagnosis not present

## 2015-09-21 DIAGNOSIS — Z87891 Personal history of nicotine dependence: Secondary | ICD-10-CM | POA: Diagnosis not present

## 2015-09-21 DIAGNOSIS — I739 Peripheral vascular disease, unspecified: Secondary | ICD-10-CM | POA: Diagnosis not present

## 2015-09-21 DIAGNOSIS — J449 Chronic obstructive pulmonary disease, unspecified: Secondary | ICD-10-CM | POA: Diagnosis not present

## 2015-09-21 DIAGNOSIS — Z4803 Encounter for change or removal of drains: Secondary | ICD-10-CM | POA: Diagnosis not present

## 2015-09-21 DIAGNOSIS — K5720 Diverticulitis of large intestine with perforation and abscess without bleeding: Secondary | ICD-10-CM | POA: Diagnosis not present

## 2015-09-21 DIAGNOSIS — M1990 Unspecified osteoarthritis, unspecified site: Secondary | ICD-10-CM | POA: Diagnosis not present

## 2015-09-21 DIAGNOSIS — E039 Hypothyroidism, unspecified: Secondary | ICD-10-CM | POA: Diagnosis not present

## 2015-09-21 DIAGNOSIS — Z48815 Encounter for surgical aftercare following surgery on the digestive system: Secondary | ICD-10-CM | POA: Diagnosis not present

## 2015-09-21 DIAGNOSIS — Z8673 Personal history of transient ischemic attack (TIA), and cerebral infarction without residual deficits: Secondary | ICD-10-CM | POA: Diagnosis not present

## 2015-09-21 DIAGNOSIS — I10 Essential (primary) hypertension: Secondary | ICD-10-CM | POA: Diagnosis not present

## 2015-09-21 LAB — CULTURE, ROUTINE-ABSCESS: CULTURE: NORMAL

## 2015-09-21 MED ORDER — METRONIDAZOLE 500 MG PO TABS: 500.0000 mg | ORAL_TABLET | Freq: Three times a day (TID) | ORAL | Status: DC

## 2015-09-21 MED ORDER — HYDROCODONE-ACETAMINOPHEN 5-300 MG PO TABS: 1.0000 | ORAL_TABLET | ORAL | Status: DC | PRN

## 2015-09-21 MED ORDER — CIPROFLOXACIN HCL 500 MG PO TABS: 500.0000 mg | ORAL_TABLET | Freq: Two times a day (BID) | ORAL | Status: DC

## 2015-09-21 MED ORDER — ACETAMINOPHEN 500 MG PO TABS: 1000.0000 mg | ORAL_TABLET | Freq: Four times a day (QID) | ORAL | Status: DC | PRN

## 2015-09-21 NOTE — NC FL2 (Signed)
[place] LEVEL OF CARE SCREENING TOOL     IDENTIFICATION  Patient Name: David Morgan Birthdate: 1925/06/02 Sex: male Admission Date (Current Location): 09/18/2015  [place] and [place] Number:  Engineering geologist and Address:  [facility], [address], [place],  [address]      Provider Number: [id number]  Attending Physician Name and Address:  Hubbard Robinson, MD  Relative Name and Phone Number:       Current Level of Care: Hospital Recommended Level of Care: [place] Prior Approval Number:    Date Approved/Denied:   PASRR Number:   [id number]   Discharge Plan: SNF    Current Diagnoses: Patient Active Problem List   Diagnosis Date Noted  . Diverticulitis of intestine with abscess 09/18/2015  . Incarcerated umbilical hernia [id number]  . SBO (small bowel obstruction) ([place])   . Diverticulitis large intestine 08/06/2015  . Diverticulitis of intestine with perforation without bleeding   . Disease of thyroid gland 07/10/2015  . BP (high blood pressure) 07/10/2015  . HLD (hyperlipidemia) 07/10/2015  . Diverticulitis 06/28/2015  . Benign fibroma of prostate 08/02/2014    Orientation RESPIRATION BLADDER Height & Weight     Self, Time, Situation, Place  Normal Continent Weight: 200 lb 3.2 oz (90.81 kg) Height:  5\' 10"  (177.8 cm)  BEHAVIORAL SYMPTOMS/MOOD NEUROLOGICAL BOWEL NUTRITION STATUS      Continent Diet (Regular)  AMBULATORY STATUS COMMUNICATION OF NEEDS Skin   Limited Assist Verbally Normal                       Personal Care Assistance Level of Assistance  Bathing, Feeding, Dressing Bathing Assistance: Limited assistance Feeding assistance: Independent Dressing Assistance: Limited assistance     Functional Limitations Info  Sight, Hearing, Speech Sight Info: Adequate Hearing Info: Impaired Speech Info: Adequate    SPECIAL CARE FACTORS FREQUENCY  PT (By licensed PT)      PT Frequency: 5 time wk              Contractures      Additional Factors Info  Allergies   Allergies Info: Adhesive, Augmentin           Current Medications (09/21/2015):  This is the current hospital active medication list Current Facility-Administered Medications  Medication Dose Route Frequency Provider Last Rate Last Dose  . acetaminophen (TYLENOL) tablet 1,000 mg  1,000 mg Oral 4 times per day Hubbard Robinson, MD   1,000 mg at 09/21/15 0650  . amLODipine (NORVASC) tablet 2.5 mg  2.5 mg Oral q morning - 10a Hubbard Robinson, MD   2.5 mg at 09/21/15 0851  . atorvastatin (LIPITOR) tablet 40 mg  40 mg Oral QPM Hubbard Robinson, MD   40 mg at 09/20/15 1833  . carvedilol (COREG) tablet 3.125 mg  3.125 mg Oral BID WC Hubbard Robinson, MD   3.125 mg at 09/21/15 0852  . dextrose 5 % in lactated ringers infusion   Intravenous Continuous Hubbard Robinson, MD 75 mL/hr at 09/20/15 2224    . feeding supplement (ENSURE ENLIVE) (ENSURE ENLIVE) liquid 237 mL  237 mL Oral TID BM Hubbard Robinson, MD   237 mL at 09/21/15 0853  . gabapentin (NEURONTIN) capsule 100 mg  100 mg Oral TID Hubbard Robinson, MD   100 mg at 09/21/15 [id number]  . heparin injection 5,000 Units  5,000 Units Subcutaneous 3 times per day Hubbard Robinson,  MD   5,000 Units at 09/21/15 0650  . hydrALAZINE (APRESOLINE) injection 10 mg  10 mg Intravenous Q2H PRN Hubbard Robinson, MD      . HYDROmorphone (DILAUDID) injection 0.5 mg  0.5 mg Intravenous Q2H PRN Hubbard Robinson, MD      . ketorolac (TORADOL) 15 MG/ML injection 15 mg  15 mg Intravenous 4 times per day Hubbard Robinson, MD   15 mg at 09/21/15 1123  . levothyroxine (SYNTHROID, LEVOTHROID) tablet 150 mcg  150 mcg Oral QAC breakfast Lenis Noon, RPH   150 mcg at 09/21/15 [id number]  . losartan (COZAAR) tablet 50 mg  50 mg Oral Daily Hubbard Robinson, MD   50 mg at 09/21/15 0852  . ondansetron (ZOFRAN-ODT) disintegrating tablet 4 mg  4 mg Oral Q6H PRN  Hubbard Robinson, MD       Or  . ondansetron (ZOFRAN) injection 4 mg  4 mg Intravenous Q6H PRN Hubbard Robinson, MD      . piperacillin-tazobactam (ZOSYN) IVPB 3.375 g  3.375 g Intravenous 3 times per day Hubbard Robinson, MD   3.375 g at 09/21/15 0650  . tamsulosin (FLOMAX) capsule 0.8 mg  0.8 mg Oral QPM Hubbard Robinson, MD   0.8 mg at 09/20/15 1833     Discharge Medications: Please see discharge summary for a list of discharge medications.  Relevant Imaging Results:  Relevant Lab Results:   Additional Information [id number]  Maurine Cane, LCSW

## 2015-09-21 NOTE — Care Management (Signed)
PT recommending SNF at time of discharge.  CSW following

## 2015-09-21 NOTE — Discharge Summary (Signed)
Physician Discharge Summary  Patient ID: David Morgan MRN: [id number] DOB/AGE: Sep 14, 1924 82 y.o.  Admit date: 09/18/2015 Discharge date: 09/21/2015  Admission Diagnoses: Diverticulitis with abscess  Discharge Diagnoses:  Active Problems:   Diverticulitis of intestine with abscess   Discharged Condition: good  Hospital Course: 82 yr old with recurrent diverticulitis of the intestine with abscess.  Patient had IR drain placed and doing well.  He has been eating well and having BMs.  He has been evaluated by physical therapy and they feel he needs SNF placement, he has a place available.   Consults: None  Significant Diagnostic Studies: CT scan  Treatments: Antibiotics and drain placement  Discharge Exam: Blood pressure 152/69, pulse 66, temperature 97.9 F (36.6 C), temperature source Oral, resp. rate 16, height 5\' 10"  (1.778 m), weight 200 lb 3.2 oz (90.81 kg), SpO2 99 %. General appearance: alert, cooperative and no distress GI: soft, non-tender, pervious umbilical incision well healed, JP drain with seropurlent  Extremities: extremities normal, atraumatic, no cyanosis or edema  Disposition: 01-Home or Self Care  Discharge Instructions    Call MD for:  persistant nausea and vomiting    Complete by:  As directed      Call MD for:  redness, tenderness, or signs of infection (pain, swelling, redness, odor or green/yellow discharge around incision site)    Complete by:  As directed      Call MD for:  severe uncontrolled pain    Complete by:  As directed      Call MD for:  temperature >100.4    Complete by:  As directed      Diet - low sodium heart healthy    Complete by:  As directed      Discharge instructions    Complete by:  As directed   Measure, Empty and recharge JP drain twice daily     Increase activity slowly    Complete by:  As directed             Medication List    TAKE these medications        acetaminophen 500 MG tablet  Commonly known as:   TYLENOL  Take 2 tablets (1,000 mg total) by mouth every 6 (six) hours as needed.     amLODipine 2.5 MG tablet  Commonly known as:  NORVASC  Take 2.5 mg by mouth every morning.     atorvastatin 40 MG tablet  Commonly known as:  LIPITOR  Take 40 mg by mouth every evening.     carvedilol 3.125 MG tablet  Commonly known as:  COREG  Take 3.125 mg by mouth 2 (two) times daily with a meal.     ciprofloxacin 500 MG tablet  Commonly known as:  CIPRO  Take 1 tablet (500 mg total) by mouth 2 (two) times daily.     gabapentin 100 MG capsule  Commonly known as:  NEURONTIN  Take 100 mg by mouth 3 (three) times daily.     Hydrocodone-Acetaminophen 5-300 MG Tabs  Commonly known as:  VICODIN  Take 1 tablet by mouth every 4 (four) hours as needed.     levothyroxine 150 MCG tablet  Commonly known as:  SYNTHROID, LEVOTHROID  Take 150 mcg by mouth every evening.     losartan 50 MG tablet  Commonly known as:  COZAAR  Take 50 mg by mouth daily.     metroNIDAZOLE 500 MG tablet  Commonly known as:  FLAGYL  Take 1 tablet (500 mg  total) by mouth 3 (three) times daily.     MULTI-VITAMINS Tabs  Take 1 tablet by mouth daily.     ondansetron 4 MG tablet  Commonly known as:  ZOFRAN  Take 1 tablet (4 mg total) by mouth every 6 (six) hours as needed for nausea.     tamsulosin 0.4 MG Caps capsule  Commonly known as:  FLOMAX  Take 0.8 mg by mouth every evening.           Follow-up Information    Follow up with [facility] .   Specialty:  [place] information:   [place] [phone]      Follow up with [facility]- On 10/03/2015.   Why:  10/03/15 at 1:45pm at BTO clinic with Dr. Mariann Barter information:   [place] [place] [id number]      Signed: Hubbard Robinson 09/21/2015, 3:14 PM

## 2015-09-21 NOTE — Progress Notes (Signed)
Patient is to be discharged to [facility] today. Patient is in no acute distress at this time, and assessment is unchanged from this morning. Patient's IV is out, discharge paperwork has been discussed with patient/family and there are no questions or concerns at this time. Packet given to family to bring to [facility]. Report has been given to receiving nurse. Family is transporting patient to [facility] he will be accompanied downstairs by staff and family via wheelchair.

## 2015-09-21 NOTE — Clinical Social Work Note (Signed)
Patient has received bed offers and patient and patient's wife have chosen [facility]. MD is aware and will complete discharge information. Patient and wife decided to go via family car. Shela Leff MSW,LCSW  [phone]

## 2015-09-21 NOTE — Clinical Social Work Placement (Signed)
   CLINICAL SOCIAL WORK PLACEMENT  NOTE  Date:  09/21/2015  Patient Details  Name: David Morgan MRN: [id number] Date of Birth: Sep 30, 1924  Clinical Social Work is seeking post-discharge placement for this patient at the [place] level of care (*CSW will initial, date and re-position this form in  chart as items are completed):  Yes   Patient/family provided with [place] Work Department's list of facilities offering this level of care within the geographic area requested by the patient (or if unable, by the patient's family).  Yes   Patient/family informed of their freedom to choose among providers that offer the needed level of care, that participate in Medicare, Medicaid or managed care program needed by the patient, have an available bed and are willing to accept the patient.  Yes   Patient/family informed of [facility]'s ownership interest in [facility] and [facility], as well as of the fact that they are under no obligation to receive care at these facilities.  PASRR submitted to EDS on       PASRR number received on       Existing PASRR number confirmed on 09/21/15     FL2 transmitted to all facilities in geographic area requested by pt/family on 09/21/15     FL2 transmitted to all facilities within larger geographic area on       Patient informed that his/her managed care company has contracts with or will negotiate with certain facilities, including the following:        Yes   Patient/family informed of bed offers received.  Patient chooses bed at  [facility])     Physician recommends and patient chooses bed at  [facility])    Patient to be transferred to  [facility]) on 09/21/15.  Patient to be transferred to facility by  (personal car)     Patient family notified on 09/21/15 of transfer.  Name of family member notified:  patient and wife     PHYSICIAN       Additional Comment:     _______________________________________________ Shela Leff, LCSW 09/21/2015, 2:15 PM

## 2015-09-21 NOTE — Clinical Social Work Note (Signed)
Clinical Social Work Assessment  Patient Details  Name: David Morgan MRN: [id number] Date of Birth: April 11, 1925  Date of referral:  09/29/15               Reason for consult:  Facility Placement                Permission sought to share information with:  Facility Sport and exercise psychologist, Family Supports Permission granted to share information::  Yes, Verbal Permission Granted  Name::     Wife... [name] (Wife... [name])  Agency::     Relationship::     Contact Information:     Housing/Transportation Living arrangements for the past 2 months:  Single Family Home Source of Information:  Patient, Medical Team, Spouse Patient Interpreter Needed:  None Criminal Activity/Legal Involvement Pertinent to Current Situation/Hospitalization:    Significant Relationships:  Adult Children, Spouse, Other Family Members Lives with:  Spouse Do you feel safe going back to the place where you live?  Yes Need for family participation in patient care:     Care giving concerns:  None at this time   Facilities manager / plan:  Holiday representative (CSW) consult for SNF placement, PT is recommending STR to assist with getting patient back to previous level of functioning.   Patient was alert, oriented, he is hard of hearing and  min engaged in conversation with CSW.  (Additional Information provided by wife at bedside).  CSW introduced self and explained role of CSW department.  Patient currently lives with his wife of 17 years.  They have several children, grand and great grand.  CSW explained that PT is recommending rehab.  Patient has been to SNF several years ago and is open and willing to go. CSW reviewed SNF process with patient, Patient is agreeable to SNF search and prefers [facility].    CSW will complete FL2, and fax out for available bed in anticipation of discharging to SNF for rehab.   Employment status:  Retired Nurse, adult PT Recommendations:   [place] / Referral to community resources:  [place]  Patient/Family's Response to care:  Patient was appreciative of information provided by CSW and will review list in the event [facility] does not have bed.  Patient/Family's Understanding of and Emotional Response to Diagnosis, Current Treatment, and Prognosis:  Patient and wife understands that he is under continued medical work up at this time.  Once medically stable patient  will discharge to SNF for STR.  Emotional Assessment Appearance:  Appears stated age Attitude/Demeanor/Rapport:    Affect (typically observed):  Accepting, Adaptable, Pleasant, Calm, Appropriate Orientation:  Oriented to Self, Oriented to Place, Oriented to  Time, Oriented to Situation Alcohol / Substance use:    Psych involvement (Current and /or in the community):  No (Comment)  Discharge Needs  Concerns to be addressed:  Care Coordination, Discharge Planning Concerns Readmission within the last 30 days:  Yes Current discharge risk:  Chronically ill, Dependent with Mobility Barriers to Discharge:  Continued Medical Work up   [employer], LCSW 09/21/2015, 12:01 PM

## 2015-09-21 NOTE — Clinical Social Work Note (Signed)
Discharge information sent to [facility]. Auth from [facility] received: [id number]. Patient's wife to transport. Shela Leff MSW,LCSW [phone]

## 2015-09-23 LAB — ANAEROBIC CULTURE

## 2015-09-24 DIAGNOSIS — R600 Localized edema: Secondary | ICD-10-CM | POA: Diagnosis not present

## 2015-09-24 DIAGNOSIS — E039 Hypothyroidism, unspecified: Secondary | ICD-10-CM | POA: Diagnosis not present

## 2015-09-24 DIAGNOSIS — I10 Essential (primary) hypertension: Secondary | ICD-10-CM | POA: Diagnosis not present

## 2015-09-25 ENCOUNTER — Ambulatory Visit: Payer: PPO

## 2015-10-03 ENCOUNTER — Ambulatory Visit (INDEPENDENT_AMBULATORY_CARE_PROVIDER_SITE_OTHER): Payer: PPO | Admitting: General Surgery

## 2015-10-03 ENCOUNTER — Encounter: Payer: Self-pay | Admitting: General Surgery

## 2015-10-03 VITALS — BP 129/64 | HR 90 | Wt 192.0 lb

## 2015-10-03 DIAGNOSIS — K5720 Diverticulitis of large intestine with perforation and abscess without bleeding: Secondary | ICD-10-CM

## 2015-10-03 NOTE — Progress Notes (Signed)
Outpatient Surgical Follow Up  10/03/2015  David Morgan is an 82 y.o. male.   Chief Complaint  Patient presents with  . Follow Up ER  . Diverticulitis - Drain placed    HPI:  82 year old male returns to clinic for follow-up from a recent diverticulitis with abscess. He had an interventional radiology placed drain which she states has had near 0 output over the last 24 hours. He denies any fevers, chills, nausea, vomiting, diarrhea, constipation. He has had a decreased appetite but has been tolerating what he has been eating. Strongly desires to have the drain removed in clinic today.  Past Medical History  Diagnosis Date  . Lesion of bladder   . History of bladder cancer   . Hypertension   . Hyperlipidemia   . Peripheral vascular disease ([place])   . Claudication ([place])   . Emphysema/COPD ([place])   . Mild asthma     NO INHALER  . Short of breath on exertion   . Hypothyroidism   . Urgency of urination   . Nocturia   . Arthritis   . Wears glasses   . History of TIA (transient ischemic attack)     2012--  NO RESIDUAL (PER SCAN HAD A PREVIOUS TIA BEFORE 2012)  . History of carotid artery stenosis   . Cancer ([place])   . Diverticulitis     Past Surgical History  Procedure Laterality Date  . Transurethral resection of bladder tumor  1990  . Total knee arthroplasty Right 2004  . Carotid endarterectomy Right 2005  . Carpal tunnel release Bilateral 2002  &  2007  . Right shoulder  surgery  2005  . Orif hip fracture Left 2008    RETAINED HARDWARE  . Umbilical hernia repair   2009  &  2011  . Inguinal hernia repair  YRS AGO  . Cataract extraction w/ intraocular lens  implant, bilateral    . Cystoscopy with biopsy N/A 12/26/2013    Procedure: CYSTOSCOPY WITH BLADDER BIOPSY;  Surgeon: Claybon Jabs, MD;  Location: [facility];  Service: Urology;  Laterality: N/A;  . Cystoscopy w/ retrogrades Bilateral 12/26/2013    Procedure: BILATERAL RETROGRADE PYELOGRAM;  Surgeon:  Claybon Jabs, MD;  Location: [facility];  Service: Urology;  Laterality: Bilateral;  . Laparoscopy  06/28/2015    Procedure: LAPAROSCOPY DIAGNOSTIC;  Surgeon: Clayburn Pert, MD;  Location: [facility];  Service: General;;  . Laparoscopic lysis of adhesions  06/28/2015    Procedure: LAPAROSCOPIC LYSIS OF ADHESIONS;  Surgeon: Clayburn Pert, MD;  Location: [facility];  Service: General;;  . Laparotomy N/A 09/03/2015    Procedure: umbilical hernia repair with mesh;  Surgeon: Hubbard Robinson, MD;  Location: [facility];  Service: General;  Laterality: N/A;    Family History  Problem Relation Age of Onset  . Hypertension Father     Social History:  reports that he quit smoking about 21 years ago. His smoking use included Cigarettes. He has a 40 pack-year smoking history. He has never used smokeless tobacco. He reports that he does not drink alcohol or use illicit drugs.  Allergies:  Allergies  Allergen Reactions  . Adhesive [Tape] Rash  . Augmentin [Amoxicillin-Pot Clavulanate] Itching, Rash and Other (See Comments)    Has patient had a PCN reaction causing immediate rash, facial/tongue/throat swelling, SOB or lightheadedness with hypotension: No Has patient had a PCN reaction causing severe rash involving mucus membranes or skin necrosis: No Has patient had a PCN  reaction that required hospitalization No Has patient had a PCN reaction occurring within the last 10 years: No If all of the above answers are "NO", then may proceed with Cephalosporin use.     Medications reviewed.    ROS  a multipoint review of systems was completed, all pertinent positives and negatives were documented within the history of present illness and the remainder are negative.   BP 129/64 mmHg  Pulse 90  Wt 87.091 kg (192 lb)  Physical Exam  Gen.: No acute distress Chest: Clear to auscultation Heart: Regular rate and rhythm Abdomen: Soft, nontender, nondistended. Well approximated  umbilical hernia site without evidence of erythema or drainage. Pigtail drain in left lower quadrant with no output in the bulb.  Extremities: Moves all extremities well without any evidence of pitting edema.    No results found for this or any previous visit (from the past 48 hour(s)). No results found.  Assessment/Plan:  1. Diverticulitis of large intestine with abscess without bleeding  82 year old male status post a fourth bout of diverticulitis in last year. Has done well with the IR placed drain. I are placed drain was removed today in clinic without any difficulty. Discussed with patient and his wife that should he develop any signs of fever, nausea, vomiting, worsening abdominal pain that he should return to the hospital immediately. Otherwise, he'll follow-up in clinic in 4 weeks to document improvement and to continue to discuss plans for elective sigmoid colectomy once the inflammation is fully resolved.     Clayburn Pert, MD FACS General Surgeon  10/03/2015,3:14 PM

## 2015-10-03 NOTE — Patient Instructions (Signed)
Call us immediately if you start having fever, chills, vomiting or abdominal pain.

## 2015-10-09 DIAGNOSIS — J449 Chronic obstructive pulmonary disease, unspecified: Secondary | ICD-10-CM | POA: Diagnosis not present

## 2015-10-09 DIAGNOSIS — K5780 Diverticulitis of intestine, part unspecified, with perforation and abscess without bleeding: Secondary | ICD-10-CM | POA: Diagnosis not present

## 2015-10-09 DIAGNOSIS — Z8551 Personal history of malignant neoplasm of bladder: Secondary | ICD-10-CM | POA: Diagnosis not present

## 2015-10-09 DIAGNOSIS — R531 Weakness: Secondary | ICD-10-CM | POA: Diagnosis not present

## 2015-10-09 DIAGNOSIS — I10 Essential (primary) hypertension: Secondary | ICD-10-CM | POA: Diagnosis not present

## 2015-10-09 DIAGNOSIS — R262 Difficulty in walking, not elsewhere classified: Secondary | ICD-10-CM | POA: Diagnosis not present

## 2015-10-16 DIAGNOSIS — K5780 Diverticulitis of intestine, part unspecified, with perforation and abscess without bleeding: Secondary | ICD-10-CM | POA: Diagnosis not present

## 2015-10-16 DIAGNOSIS — R262 Difficulty in walking, not elsewhere classified: Secondary | ICD-10-CM | POA: Diagnosis not present

## 2015-10-16 DIAGNOSIS — R531 Weakness: Secondary | ICD-10-CM | POA: Diagnosis not present

## 2015-10-16 DIAGNOSIS — J449 Chronic obstructive pulmonary disease, unspecified: Secondary | ICD-10-CM | POA: Diagnosis not present

## 2015-10-16 DIAGNOSIS — I10 Essential (primary) hypertension: Secondary | ICD-10-CM | POA: Diagnosis not present

## 2015-10-16 DIAGNOSIS — Z8551 Personal history of malignant neoplasm of bladder: Secondary | ICD-10-CM | POA: Diagnosis not present

## 2015-10-19 DIAGNOSIS — J449 Chronic obstructive pulmonary disease, unspecified: Secondary | ICD-10-CM | POA: Diagnosis not present

## 2015-10-19 DIAGNOSIS — K5780 Diverticulitis of intestine, part unspecified, with perforation and abscess without bleeding: Secondary | ICD-10-CM | POA: Diagnosis not present

## 2015-10-19 DIAGNOSIS — Z8551 Personal history of malignant neoplasm of bladder: Secondary | ICD-10-CM | POA: Diagnosis not present

## 2015-10-19 DIAGNOSIS — I10 Essential (primary) hypertension: Secondary | ICD-10-CM | POA: Diagnosis not present

## 2015-10-19 DIAGNOSIS — R262 Difficulty in walking, not elsewhere classified: Secondary | ICD-10-CM | POA: Diagnosis not present

## 2015-10-19 DIAGNOSIS — R531 Weakness: Secondary | ICD-10-CM | POA: Diagnosis not present

## 2015-10-23 DIAGNOSIS — I10 Essential (primary) hypertension: Secondary | ICD-10-CM | POA: Diagnosis not present

## 2015-10-23 DIAGNOSIS — K5780 Diverticulitis of intestine, part unspecified, with perforation and abscess without bleeding: Secondary | ICD-10-CM | POA: Diagnosis not present

## 2015-10-23 DIAGNOSIS — R531 Weakness: Secondary | ICD-10-CM | POA: Diagnosis not present

## 2015-10-23 DIAGNOSIS — R262 Difficulty in walking, not elsewhere classified: Secondary | ICD-10-CM | POA: Diagnosis not present

## 2015-10-23 DIAGNOSIS — J449 Chronic obstructive pulmonary disease, unspecified: Secondary | ICD-10-CM | POA: Diagnosis not present

## 2015-10-23 DIAGNOSIS — Z8551 Personal history of malignant neoplasm of bladder: Secondary | ICD-10-CM | POA: Diagnosis not present

## 2015-10-26 DIAGNOSIS — R531 Weakness: Secondary | ICD-10-CM | POA: Diagnosis not present

## 2015-10-26 DIAGNOSIS — I10 Essential (primary) hypertension: Secondary | ICD-10-CM | POA: Diagnosis not present

## 2015-10-26 DIAGNOSIS — J449 Chronic obstructive pulmonary disease, unspecified: Secondary | ICD-10-CM | POA: Diagnosis not present

## 2015-10-26 DIAGNOSIS — Z8551 Personal history of malignant neoplasm of bladder: Secondary | ICD-10-CM | POA: Diagnosis not present

## 2015-10-26 DIAGNOSIS — K5780 Diverticulitis of intestine, part unspecified, with perforation and abscess without bleeding: Secondary | ICD-10-CM | POA: Diagnosis not present

## 2015-10-26 DIAGNOSIS — R262 Difficulty in walking, not elsewhere classified: Secondary | ICD-10-CM | POA: Diagnosis not present

## 2015-10-31 ENCOUNTER — Encounter: Payer: Self-pay | Admitting: Surgery

## 2015-10-31 ENCOUNTER — Ambulatory Visit: Payer: Self-pay | Admitting: Surgery

## 2015-10-31 ENCOUNTER — Ambulatory Visit (INDEPENDENT_AMBULATORY_CARE_PROVIDER_SITE_OTHER): Payer: PPO | Admitting: Surgery

## 2015-10-31 VITALS — BP 104/62 | HR 85 | Temp 97.3°F | Wt 192.0 lb

## 2015-10-31 DIAGNOSIS — K5720 Diverticulitis of large intestine with perforation and abscess without bleeding: Secondary | ICD-10-CM

## 2015-10-31 MED ORDER — CIPROFLOXACIN HCL 500 MG PO TABS: 500.0000 mg | ORAL_TABLET | Freq: Two times a day (BID) | ORAL | Status: DC

## 2015-10-31 MED ORDER — METRONIDAZOLE 500 MG PO TABS: 500.0000 mg | ORAL_TABLET | Freq: Three times a day (TID) | ORAL | Status: DC

## 2015-10-31 NOTE — Addendum Note (Signed)
Addended by: Wayna Chalet on: 10/31/2015 05:15 PM   Modules accepted: Orders

## 2015-10-31 NOTE — Patient Instructions (Addendum)
Discussed with patient and his wife that should he develop any signs of fever, nausea, vomiting, worsening abdominal pain that he should return to the hospital immediately.   At this time you are able to stay on antibiotics for six more weeks. If this changes, please give [place] a call so we could schedule a CT Scan and then see you back at the office.

## 2015-10-31 NOTE — Progress Notes (Signed)
82yr old male with two recent bouts of diverticulitis with abscess.  He is about 6 weeks out from the last bout that required IR drain placement.  Patient doing better today.  He states energy better patient states that his appetite has improved and he's eating well. Patient states that he is having 2 bowel movements in the a.m. Patient denies having any diarrhea or blood in stool. Also denies having any left lower abdominal pain or any type of the abdominal pain he was having before. He is walking around the house with a walker and states that he probably could walk around some with a cane however he is scared of falling so chooses the more safe option.  Filed Vitals:   10/31/15 1505  BP: 104/62  Pulse: 85  Temp: 97.3 F (36.3 C)   PE:  Gen: NAD Res: CTAB/L  Cardio: RRR Abd: soft, non-distended, non-tender Ext: 2+ pulses, no edema  A/P: 82 year old has had a complicated course with diverticulitis including having to have 2 abscesses drained and having a incarcerated umbilical hernia repair between. I do long discussion with the patient and his wife concerning the options for treatment of this to include prolonged antibiotic course with observation versus an operation. Did discuss the risk benefits alternatives and expected outcomes with the treatment options, including bleeding infection, anastomotic leak, need for further drainage procedures, need for ostomy placement, prolonged ICU stay, pneumonia, decrease in functional status, need for rehabilitation, heart attack, stroke, blood clots and death. I also discussed the risk involved with prolonged antibiotic therapy including resistance to the antibiotics, recurrence of the diverticulitis even on the antibiotics and potential for C. difficile colitis as well. After given time to consider the patient and his wife would like to choose prolonged antibiotic course as opposed to going through surgery. We will continue his Cipro and Flagyl for another 6  weeks and have her return to clinic to evaluate how he is doing. We'll likely keep him on this for a course for 6 months and then try taking him off of it at that time. Patient is to stay on a high-fiber diet and to avoid nuts, seeds, corn and popcorn.

## 2015-11-13 ENCOUNTER — Telehealth: Payer: Self-pay | Admitting: General Surgery

## 2015-11-13 NOTE — Telephone Encounter (Signed)
I put a Home Health Discharge Report to the patients chart

## 2015-11-13 NOTE — Telephone Encounter (Signed)
Noted  

## 2015-11-21 DIAGNOSIS — L570 Actinic keratosis: Secondary | ICD-10-CM | POA: Diagnosis not present

## 2015-11-21 DIAGNOSIS — L578 Other skin changes due to chronic exposure to nonionizing radiation: Secondary | ICD-10-CM | POA: Diagnosis not present

## 2015-11-21 DIAGNOSIS — L812 Freckles: Secondary | ICD-10-CM | POA: Diagnosis not present

## 2015-11-21 DIAGNOSIS — L821 Other seborrheic keratosis: Secondary | ICD-10-CM | POA: Diagnosis not present

## 2015-11-27 DIAGNOSIS — N400 Enlarged prostate without lower urinary tract symptoms: Secondary | ICD-10-CM | POA: Diagnosis not present

## 2015-11-27 DIAGNOSIS — I10 Essential (primary) hypertension: Secondary | ICD-10-CM | POA: Diagnosis not present

## 2015-11-27 DIAGNOSIS — K5720 Diverticulitis of large intestine with perforation and abscess without bleeding: Secondary | ICD-10-CM | POA: Diagnosis not present

## 2015-11-27 DIAGNOSIS — E079 Disorder of thyroid, unspecified: Secondary | ICD-10-CM | POA: Diagnosis not present

## 2015-12-04 DIAGNOSIS — K5720 Diverticulitis of large intestine with perforation and abscess without bleeding: Secondary | ICD-10-CM | POA: Diagnosis not present

## 2015-12-04 DIAGNOSIS — I10 Essential (primary) hypertension: Secondary | ICD-10-CM | POA: Diagnosis not present

## 2015-12-04 DIAGNOSIS — E7800 Pure hypercholesterolemia, unspecified: Secondary | ICD-10-CM | POA: Diagnosis not present

## 2015-12-04 DIAGNOSIS — N400 Enlarged prostate without lower urinary tract symptoms: Secondary | ICD-10-CM | POA: Diagnosis not present

## 2015-12-04 DIAGNOSIS — R413 Other amnesia: Secondary | ICD-10-CM | POA: Diagnosis not present

## 2015-12-04 DIAGNOSIS — Z23 Encounter for immunization: Secondary | ICD-10-CM | POA: Diagnosis not present

## 2015-12-04 DIAGNOSIS — G20 Parkinson's disease: Secondary | ICD-10-CM | POA: Diagnosis not present

## 2015-12-13 ENCOUNTER — Telehealth: Payer: Self-pay | Admitting: Surgery

## 2015-12-13 NOTE — Telephone Encounter (Signed)
Returned phone call to patient's wife at this time. She states that patient has been having weakness and tremors x 2 weeks. He saw Dr. [name] this past week and was given 2 differentials (Benign Tremors or Parkinson's Disease). Patient was placed on a medication and has actually gotten worse per wife.

## 2015-12-13 NOTE — Telephone Encounter (Signed)
Patients wife,Fay, called and said patient is having weakness, limbs jerking so much he can't stand up. Could this be the antibiotics he's on? Cipro and Flagyl.

## 2015-12-19 ENCOUNTER — Ambulatory Visit: Payer: Self-pay | Admitting: Surgery

## 2015-12-22 ENCOUNTER — Other Ambulatory Visit: Payer: Self-pay | Admitting: Surgery

## 2015-12-24 ENCOUNTER — Other Ambulatory Visit: Payer: Self-pay | Admitting: Surgery

## 2016-01-04 ENCOUNTER — Other Ambulatory Visit: Payer: Self-pay

## 2016-01-09 ENCOUNTER — Encounter: Payer: Self-pay | Admitting: Surgery

## 2016-01-09 ENCOUNTER — Other Ambulatory Visit: Payer: Self-pay

## 2016-01-09 ENCOUNTER — Ambulatory Visit (INDEPENDENT_AMBULATORY_CARE_PROVIDER_SITE_OTHER): Payer: PPO | Admitting: Surgery

## 2016-01-09 VITALS — BP 151/80 | HR 76 | Temp 98.7°F

## 2016-01-09 DIAGNOSIS — K5720 Diverticulitis of large intestine with perforation and abscess without bleeding: Secondary | ICD-10-CM

## 2016-01-09 DIAGNOSIS — K5780 Diverticulitis of intestine, part unspecified, with perforation and abscess without bleeding: Secondary | ICD-10-CM

## 2016-01-09 MED ORDER — CIPROFLOXACIN HCL 500 MG PO TABS: 500.0000 mg | ORAL_TABLET | Freq: Two times a day (BID) | ORAL | Status: DC

## 2016-01-09 MED ORDER — METRONIDAZOLE 500 MG PO TABS: 500.0000 mg | ORAL_TABLET | Freq: Three times a day (TID) | ORAL | Status: DC

## 2016-01-09 NOTE — Patient Instructions (Signed)
We will call you in August to see you for Diverticulitis.

## 2016-01-09 NOTE — Progress Notes (Signed)
Subjective:     Patient ID: David Morgan, male   DOB: 04-20-1925, 82 y.o.   MRN: [id number]  HPI 82yr old male with two recent bouts of diverticulitis with abscess.  He has been on chronic antibiotic treatment for about 3 months.  He has not had any abdominal pain.  His appetite is still poor but he has been eating.  He has a BM that is loose about 2-3 times a day but not frank diarrhea.  He has had falls recently that is attributed to his tremor.  He is seeing his PCP Dr. [name] for this and f/u with neurologist in June.   Past Medical History  Diagnosis Date  . Lesion of bladder   . History of bladder cancer   . Hypertension   . Hyperlipidemia   . Peripheral vascular disease ([place])   . Claudication ([place])   . Emphysema/COPD ([place])   . Mild asthma     NO INHALER  . Short of breath on exertion   . Hypothyroidism   . Urgency of urination   . Nocturia   . Arthritis   . Wears glasses   . History of TIA (transient ischemic attack)     2012--  NO RESIDUAL (PER SCAN HAD A PREVIOUS TIA BEFORE 2012)  . History of carotid artery stenosis   . Cancer ([place])   . Diverticulitis    Past Surgical History  Procedure Laterality Date  . Transurethral resection of bladder tumor  1990  . Total knee arthroplasty Right 2004  . Carotid endarterectomy Right 2005  . Carpal tunnel release Bilateral 2002  &  2007  . Right shoulder  surgery  2005  . Orif hip fracture Left 2008    RETAINED HARDWARE  . Umbilical hernia repair   2009  &  2011  . Inguinal hernia repair  YRS AGO  . Cataract extraction w/ intraocular lens  implant, bilateral    . Cystoscopy with biopsy N/A 12/26/2013    Procedure: CYSTOSCOPY WITH BLADDER BIOPSY;  Surgeon: Claybon Jabs, MD;  [facility];  Service: Urology;  Laterality: N/A;  . Cystoscopy w/ retrogrades Bilateral 12/26/2013    Procedure: BILATERAL RETROGRADE PYELOGRAM;  Surgeon: Claybon Jabs, MD;  Location: [facility];  Service:  Urology;  Laterality: Bilateral;  . Laparoscopy  06/28/2015    Procedure: LAPAROSCOPY DIAGNOSTIC;  Surgeon: Clayburn Pert, MD;  Location: [facility];  Service: General;;  . Laparoscopic lysis of adhesions  06/28/2015    Procedure: LAPAROSCOPIC LYSIS OF ADHESIONS;  Surgeon: Clayburn Pert, MD;  Location: [facility];  Service: General;;  . Laparotomy N/A 09/03/2015    Procedure: umbilical hernia repair with mesh;  Surgeon: Hubbard Robinson, MD;  Location: [facility];  Service: General;  Laterality: N/A;   Family History  Problem Relation Age of Onset  . Hypertension Father    Social History   Social History  . Marital Status: Married    Spouse Name: N/A  . Number of Children: N/A  . Years of Education: N/A   Social History Main Topics  . Smoking status: Former Smoker -- 1.00 packs/day for 40 years    Types: Cigarettes    Quit date: 12/21/1993  . Smokeless tobacco: Never Used  . Alcohol Use: No     Comment: RARE  . Drug Use: No  . Sexual Activity: No   Other Topics Concern  . None   Social History Narrative    Current outpatient prescriptions:  .  acetaminophen (TYLENOL) 500 MG tablet, Take 2 tablets (1,000 mg total) by mouth every 6 (six) hours as needed., Disp: 30 tablet, Rfl: 0 .  amLODipine (NORVASC) 2.5 MG tablet, Take 2.5 mg by mouth every morning. Reported on 10/03/2015, Disp: , Rfl:  .  atorvastatin (LIPITOR) 40 MG tablet, Take 40 mg by mouth every evening., Disp: , Rfl:  .  carbidopa-levodopa (SINEMET IR) 25-100 MG tablet, Take 1 tablet by mouth 3 (three) times daily., Disp: , Rfl:  .  carvedilol (COREG) 3.125 MG tablet, Take 3.125 mg by mouth 2 (two) times daily with a meal., Disp: , Rfl:  .  ciprofloxacin (CIPRO) 500 MG tablet, Take 1 tablet (500 mg total) by mouth 2 (two) times daily., Disp: 180 tablet, Rfl: 0 .  gabapentin (NEURONTIN) 100 MG capsule, Take 100 mg by mouth 3 (three) times daily. , Disp: , Rfl:  .  Hydrocodone-Acetaminophen (VICODIN) 5-300 MG TABS,  Take 1 tablet by mouth every 4 (four) hours as needed., Disp: 30 each, Rfl: 0 .  levothyroxine (SYNTHROID, LEVOTHROID) 137 MCG tablet, Take 1 tablet by mouth 1 day or 1 dose., Disp: , Rfl:  .  levothyroxine (SYNTHROID, LEVOTHROID) 150 MCG tablet, Take 150 mcg by mouth every evening., Disp: , Rfl:  .  losartan (COZAAR) 50 MG tablet, Take 50 mg by mouth daily. , Disp: , Rfl:  .  metroNIDAZOLE (FLAGYL) 500 MG tablet, Take 1 tablet (500 mg total) by mouth 3 (three) times daily., Disp: 270 tablet, Rfl: 0 .  Multiple Vitamin (MULTI-VITAMINS) TABS, Take 1 tablet by mouth daily., Disp: , Rfl:  .  ondansetron (ZOFRAN) 4 MG tablet, Take 1 tablet (4 mg total) by mouth every 6 (six) hours as needed for nausea., Disp: 20 tablet, Rfl: 0 .  tamsulosin (FLOMAX) 0.4 MG CAPS capsule, Take 0.8 mg by mouth every evening., Disp: , Rfl:  Allergies  Allergen Reactions  . Adhesive [Tape] Rash  . Augmentin [Amoxicillin-Pot Clavulanate] Itching, Rash and Other (See Comments)    Has patient had a PCN reaction causing immediate rash, facial/tongue/throat swelling, SOB or lightheadedness with hypotension: No Has patient had a PCN reaction causing severe rash involving mucus membranes or skin necrosis: No Has patient had a PCN reaction that required hospitalization No Has patient had a PCN reaction occurring within the last 10 years: No If all of the above answers are "NO", then may proceed with Cephalosporin use.        Review of Systems  Constitutional: Negative for activity change and fatigue.  HENT: Negative for congestion and sore throat.   Respiratory: Negative for cough and wheezing.   Cardiovascular: Negative for chest pain and palpitations.  Gastrointestinal: Negative for diarrhea and abdominal distention.  Musculoskeletal: Negative for back pain.  Skin: Negative for wound.  Neurological: Positive for tremors and weakness.  Psychiatric/Behavioral: Negative for agitation. The patient is not  nervous/anxious.   All other systems reviewed and are negative.      Filed Vitals:   01/09/16 1326  BP: 151/80  Pulse: 76  Temp: 98.7 F (37.1 C)    Objective:   Physical Exam  Constitutional: He is oriented to person, place, and time. He appears well-developed and well-nourished. No distress.  HENT:  Head: Normocephalic and atraumatic.  Right Ear: External ear normal.  Left Ear: External ear normal.  Nose: Nose normal.  Mouth/Throat: Oropharynx is clear and moist. No oropharyngeal exudate.  Fungal rash on posterior scalp  Eyes: Conjunctivae and EOM are normal. Pupils are  equal, round, and reactive to light. No scleral icterus.  Neck: Normal range of motion. Neck supple. No tracheal deviation present.  Cardiovascular: Normal rate and regular rhythm.   Pulmonary/Chest: Effort normal and breath sounds normal. No respiratory distress.  Abdominal: Soft. Bowel sounds are normal. He exhibits no distension. There is no tenderness.  Musculoskeletal: Normal range of motion. He exhibits no edema or tenderness.  Neurological: He is alert and oriented to person, place, and time. No cranial nerve deficit.  Skin: Skin is warm and dry. No rash noted. No erythema. No pallor.  Psychiatric: He has a normal mood and affect. His behavior is normal. Judgment and thought content normal.  Vitals reviewed.      Assessment:     82yr old with diverticulitis with abscess on chronic antibiotic therapy    Plan:     He seems to be doing well, without abdominal pain, eating beter and has stopped loosing weight.  Will continue abx for 3 more months then will try him off if he continues to do well.  Will have him f/u in clinic in 3 months.

## 2016-02-04 DIAGNOSIS — R531 Weakness: Secondary | ICD-10-CM | POA: Diagnosis not present

## 2016-02-04 DIAGNOSIS — R251 Tremor, unspecified: Secondary | ICD-10-CM | POA: Insufficient documentation

## 2016-02-04 DIAGNOSIS — R262 Difficulty in walking, not elsewhere classified: Secondary | ICD-10-CM | POA: Insufficient documentation

## 2016-02-06 DIAGNOSIS — R251 Tremor, unspecified: Secondary | ICD-10-CM | POA: Diagnosis not present

## 2016-02-06 DIAGNOSIS — R269 Unspecified abnormalities of gait and mobility: Secondary | ICD-10-CM | POA: Diagnosis not present

## 2016-02-06 DIAGNOSIS — J449 Chronic obstructive pulmonary disease, unspecified: Secondary | ICD-10-CM | POA: Diagnosis not present

## 2016-02-06 DIAGNOSIS — M6281 Muscle weakness (generalized): Secondary | ICD-10-CM | POA: Diagnosis not present

## 2016-02-06 DIAGNOSIS — M6250 Muscle wasting and atrophy, not elsewhere classified, unspecified site: Secondary | ICD-10-CM | POA: Diagnosis not present

## 2016-02-06 DIAGNOSIS — E119 Type 2 diabetes mellitus without complications: Secondary | ICD-10-CM | POA: Diagnosis not present

## 2016-02-06 DIAGNOSIS — M150 Primary generalized (osteo)arthritis: Secondary | ICD-10-CM | POA: Diagnosis not present

## 2016-02-06 DIAGNOSIS — I10 Essential (primary) hypertension: Secondary | ICD-10-CM | POA: Diagnosis not present

## 2016-02-06 DIAGNOSIS — E039 Hypothyroidism, unspecified: Secondary | ICD-10-CM | POA: Diagnosis not present

## 2016-02-11 DIAGNOSIS — M6250 Muscle wasting and atrophy, not elsewhere classified, unspecified site: Secondary | ICD-10-CM | POA: Diagnosis not present

## 2016-02-11 DIAGNOSIS — M150 Primary generalized (osteo)arthritis: Secondary | ICD-10-CM | POA: Diagnosis not present

## 2016-02-11 DIAGNOSIS — E039 Hypothyroidism, unspecified: Secondary | ICD-10-CM | POA: Diagnosis not present

## 2016-02-11 DIAGNOSIS — E119 Type 2 diabetes mellitus without complications: Secondary | ICD-10-CM | POA: Diagnosis not present

## 2016-02-11 DIAGNOSIS — I10 Essential (primary) hypertension: Secondary | ICD-10-CM | POA: Diagnosis not present

## 2016-02-11 DIAGNOSIS — R251 Tremor, unspecified: Secondary | ICD-10-CM | POA: Diagnosis not present

## 2016-02-11 DIAGNOSIS — M6281 Muscle weakness (generalized): Secondary | ICD-10-CM | POA: Diagnosis not present

## 2016-02-11 DIAGNOSIS — J449 Chronic obstructive pulmonary disease, unspecified: Secondary | ICD-10-CM | POA: Diagnosis not present

## 2016-02-11 DIAGNOSIS — R269 Unspecified abnormalities of gait and mobility: Secondary | ICD-10-CM | POA: Diagnosis not present

## 2016-02-12 DIAGNOSIS — R531 Weakness: Secondary | ICD-10-CM | POA: Diagnosis not present

## 2016-02-12 DIAGNOSIS — K5720 Diverticulitis of large intestine with perforation and abscess without bleeding: Secondary | ICD-10-CM | POA: Diagnosis not present

## 2016-02-12 DIAGNOSIS — G609 Hereditary and idiopathic neuropathy, unspecified: Secondary | ICD-10-CM | POA: Diagnosis not present

## 2016-02-12 DIAGNOSIS — R29898 Other symptoms and signs involving the musculoskeletal system: Secondary | ICD-10-CM | POA: Diagnosis not present

## 2016-02-12 DIAGNOSIS — R251 Tremor, unspecified: Secondary | ICD-10-CM | POA: Diagnosis not present

## 2016-02-12 DIAGNOSIS — I10 Essential (primary) hypertension: Secondary | ICD-10-CM | POA: Diagnosis not present

## 2016-02-12 DIAGNOSIS — R296 Repeated falls: Secondary | ICD-10-CM | POA: Diagnosis not present

## 2016-02-12 DIAGNOSIS — R262 Difficulty in walking, not elsewhere classified: Secondary | ICD-10-CM | POA: Diagnosis not present

## 2016-02-13 ENCOUNTER — Other Ambulatory Visit: Payer: Self-pay | Admitting: Internal Medicine

## 2016-02-13 DIAGNOSIS — R296 Repeated falls: Secondary | ICD-10-CM

## 2016-02-13 DIAGNOSIS — R29898 Other symptoms and signs involving the musculoskeletal system: Secondary | ICD-10-CM

## 2016-02-18 DIAGNOSIS — E119 Type 2 diabetes mellitus without complications: Secondary | ICD-10-CM | POA: Diagnosis not present

## 2016-02-18 DIAGNOSIS — R269 Unspecified abnormalities of gait and mobility: Secondary | ICD-10-CM | POA: Diagnosis not present

## 2016-02-18 DIAGNOSIS — E039 Hypothyroidism, unspecified: Secondary | ICD-10-CM | POA: Diagnosis not present

## 2016-02-18 DIAGNOSIS — M6250 Muscle wasting and atrophy, not elsewhere classified, unspecified site: Secondary | ICD-10-CM | POA: Diagnosis not present

## 2016-02-18 DIAGNOSIS — M150 Primary generalized (osteo)arthritis: Secondary | ICD-10-CM | POA: Diagnosis not present

## 2016-02-18 DIAGNOSIS — I10 Essential (primary) hypertension: Secondary | ICD-10-CM | POA: Diagnosis not present

## 2016-02-18 DIAGNOSIS — M6281 Muscle weakness (generalized): Secondary | ICD-10-CM | POA: Diagnosis not present

## 2016-02-18 DIAGNOSIS — R251 Tremor, unspecified: Secondary | ICD-10-CM | POA: Diagnosis not present

## 2016-02-18 DIAGNOSIS — J449 Chronic obstructive pulmonary disease, unspecified: Secondary | ICD-10-CM | POA: Diagnosis not present

## 2016-02-21 DIAGNOSIS — I10 Essential (primary) hypertension: Secondary | ICD-10-CM | POA: Diagnosis not present

## 2016-02-21 DIAGNOSIS — E039 Hypothyroidism, unspecified: Secondary | ICD-10-CM | POA: Diagnosis not present

## 2016-02-21 DIAGNOSIS — J449 Chronic obstructive pulmonary disease, unspecified: Secondary | ICD-10-CM | POA: Diagnosis not present

## 2016-02-21 DIAGNOSIS — E119 Type 2 diabetes mellitus without complications: Secondary | ICD-10-CM | POA: Diagnosis not present

## 2016-02-21 DIAGNOSIS — R251 Tremor, unspecified: Secondary | ICD-10-CM | POA: Diagnosis not present

## 2016-02-21 DIAGNOSIS — M6281 Muscle weakness (generalized): Secondary | ICD-10-CM | POA: Diagnosis not present

## 2016-02-21 DIAGNOSIS — M150 Primary generalized (osteo)arthritis: Secondary | ICD-10-CM | POA: Diagnosis not present

## 2016-02-21 DIAGNOSIS — R269 Unspecified abnormalities of gait and mobility: Secondary | ICD-10-CM | POA: Diagnosis not present

## 2016-02-21 DIAGNOSIS — M6250 Muscle wasting and atrophy, not elsewhere classified, unspecified site: Secondary | ICD-10-CM | POA: Diagnosis not present

## 2016-02-25 DIAGNOSIS — E119 Type 2 diabetes mellitus without complications: Secondary | ICD-10-CM | POA: Diagnosis not present

## 2016-02-25 DIAGNOSIS — J449 Chronic obstructive pulmonary disease, unspecified: Secondary | ICD-10-CM | POA: Diagnosis not present

## 2016-02-25 DIAGNOSIS — R269 Unspecified abnormalities of gait and mobility: Secondary | ICD-10-CM | POA: Diagnosis not present

## 2016-02-25 DIAGNOSIS — I10 Essential (primary) hypertension: Secondary | ICD-10-CM | POA: Diagnosis not present

## 2016-02-25 DIAGNOSIS — M6250 Muscle wasting and atrophy, not elsewhere classified, unspecified site: Secondary | ICD-10-CM | POA: Diagnosis not present

## 2016-02-25 DIAGNOSIS — M150 Primary generalized (osteo)arthritis: Secondary | ICD-10-CM | POA: Diagnosis not present

## 2016-02-25 DIAGNOSIS — R251 Tremor, unspecified: Secondary | ICD-10-CM | POA: Diagnosis not present

## 2016-02-25 DIAGNOSIS — M6281 Muscle weakness (generalized): Secondary | ICD-10-CM | POA: Diagnosis not present

## 2016-02-25 DIAGNOSIS — E039 Hypothyroidism, unspecified: Secondary | ICD-10-CM | POA: Diagnosis not present

## 2016-02-29 DIAGNOSIS — R269 Unspecified abnormalities of gait and mobility: Secondary | ICD-10-CM | POA: Diagnosis not present

## 2016-02-29 DIAGNOSIS — M150 Primary generalized (osteo)arthritis: Secondary | ICD-10-CM | POA: Diagnosis not present

## 2016-02-29 DIAGNOSIS — E039 Hypothyroidism, unspecified: Secondary | ICD-10-CM | POA: Diagnosis not present

## 2016-02-29 DIAGNOSIS — I10 Essential (primary) hypertension: Secondary | ICD-10-CM | POA: Diagnosis not present

## 2016-02-29 DIAGNOSIS — M6281 Muscle weakness (generalized): Secondary | ICD-10-CM | POA: Diagnosis not present

## 2016-02-29 DIAGNOSIS — E119 Type 2 diabetes mellitus without complications: Secondary | ICD-10-CM | POA: Diagnosis not present

## 2016-02-29 DIAGNOSIS — J449 Chronic obstructive pulmonary disease, unspecified: Secondary | ICD-10-CM | POA: Diagnosis not present

## 2016-02-29 DIAGNOSIS — R251 Tremor, unspecified: Secondary | ICD-10-CM | POA: Diagnosis not present

## 2016-02-29 DIAGNOSIS — M6250 Muscle wasting and atrophy, not elsewhere classified, unspecified site: Secondary | ICD-10-CM | POA: Diagnosis not present

## 2016-03-01 ENCOUNTER — Ambulatory Visit: Payer: PPO

## 2016-03-03 DIAGNOSIS — M150 Primary generalized (osteo)arthritis: Secondary | ICD-10-CM | POA: Diagnosis not present

## 2016-03-03 DIAGNOSIS — E119 Type 2 diabetes mellitus without complications: Secondary | ICD-10-CM | POA: Diagnosis not present

## 2016-03-03 DIAGNOSIS — E039 Hypothyroidism, unspecified: Secondary | ICD-10-CM | POA: Diagnosis not present

## 2016-03-03 DIAGNOSIS — M6281 Muscle weakness (generalized): Secondary | ICD-10-CM | POA: Diagnosis not present

## 2016-03-03 DIAGNOSIS — J449 Chronic obstructive pulmonary disease, unspecified: Secondary | ICD-10-CM | POA: Diagnosis not present

## 2016-03-03 DIAGNOSIS — M6250 Muscle wasting and atrophy, not elsewhere classified, unspecified site: Secondary | ICD-10-CM | POA: Diagnosis not present

## 2016-03-03 DIAGNOSIS — R269 Unspecified abnormalities of gait and mobility: Secondary | ICD-10-CM | POA: Diagnosis not present

## 2016-03-03 DIAGNOSIS — R251 Tremor, unspecified: Secondary | ICD-10-CM | POA: Diagnosis not present

## 2016-03-03 DIAGNOSIS — I10 Essential (primary) hypertension: Secondary | ICD-10-CM | POA: Diagnosis not present

## 2016-03-06 DIAGNOSIS — R531 Weakness: Secondary | ICD-10-CM | POA: Diagnosis not present

## 2016-03-07 DIAGNOSIS — J449 Chronic obstructive pulmonary disease, unspecified: Secondary | ICD-10-CM | POA: Diagnosis not present

## 2016-03-07 DIAGNOSIS — M150 Primary generalized (osteo)arthritis: Secondary | ICD-10-CM | POA: Diagnosis not present

## 2016-03-07 DIAGNOSIS — R251 Tremor, unspecified: Secondary | ICD-10-CM | POA: Diagnosis not present

## 2016-03-07 DIAGNOSIS — E039 Hypothyroidism, unspecified: Secondary | ICD-10-CM | POA: Diagnosis not present

## 2016-03-07 DIAGNOSIS — R269 Unspecified abnormalities of gait and mobility: Secondary | ICD-10-CM | POA: Diagnosis not present

## 2016-03-07 DIAGNOSIS — E119 Type 2 diabetes mellitus without complications: Secondary | ICD-10-CM | POA: Diagnosis not present

## 2016-03-07 DIAGNOSIS — M6250 Muscle wasting and atrophy, not elsewhere classified, unspecified site: Secondary | ICD-10-CM | POA: Diagnosis not present

## 2016-03-07 DIAGNOSIS — I10 Essential (primary) hypertension: Secondary | ICD-10-CM | POA: Diagnosis not present

## 2016-03-07 DIAGNOSIS — M6281 Muscle weakness (generalized): Secondary | ICD-10-CM | POA: Diagnosis not present

## 2016-03-11 ENCOUNTER — Emergency Department
Admission: EM | Admit: 2016-03-11 | Discharge: 2016-03-11 | Disposition: A | Discharge status: Home / Self Care | Payer: PPO | Attending: Emergency Medicine | Admitting: Emergency Medicine

## 2016-03-11 ENCOUNTER — Emergency Department: Payer: PPO

## 2016-03-11 ENCOUNTER — Encounter: Payer: Self-pay | Admitting: Emergency Medicine

## 2016-03-11 DIAGNOSIS — I10 Essential (primary) hypertension: Secondary | ICD-10-CM | POA: Diagnosis not present

## 2016-03-11 DIAGNOSIS — M6281 Muscle weakness (generalized): Secondary | ICD-10-CM | POA: Diagnosis not present

## 2016-03-11 DIAGNOSIS — Z8551 Personal history of malignant neoplasm of bladder: Secondary | ICD-10-CM | POA: Insufficient documentation

## 2016-03-11 DIAGNOSIS — R9082 White matter disease, unspecified: Secondary | ICD-10-CM | POA: Diagnosis not present

## 2016-03-11 DIAGNOSIS — R296 Repeated falls: Secondary | ICD-10-CM | POA: Diagnosis not present

## 2016-03-11 DIAGNOSIS — N419 Inflammatory disease of prostate, unspecified: Secondary | ICD-10-CM | POA: Diagnosis not present

## 2016-03-11 DIAGNOSIS — Z87891 Personal history of nicotine dependence: Secondary | ICD-10-CM | POA: Diagnosis not present

## 2016-03-11 DIAGNOSIS — Z8679 Personal history of other diseases of the circulatory system: Secondary | ICD-10-CM | POA: Insufficient documentation

## 2016-03-11 DIAGNOSIS — J45909 Unspecified asthma, uncomplicated: Secondary | ICD-10-CM | POA: Insufficient documentation

## 2016-03-11 DIAGNOSIS — E785 Hyperlipidemia, unspecified: Secondary | ICD-10-CM | POA: Diagnosis not present

## 2016-03-11 DIAGNOSIS — Z792 Long term (current) use of antibiotics: Secondary | ICD-10-CM | POA: Insufficient documentation

## 2016-03-11 DIAGNOSIS — Z5189 Encounter for other specified aftercare: Secondary | ICD-10-CM | POA: Diagnosis not present

## 2016-03-11 DIAGNOSIS — Z8673 Personal history of transient ischemic attack (TIA), and cerebral infarction without residual deficits: Secondary | ICD-10-CM | POA: Insufficient documentation

## 2016-03-11 DIAGNOSIS — E039 Hypothyroidism, unspecified: Secondary | ICD-10-CM | POA: Diagnosis not present

## 2016-03-11 DIAGNOSIS — M1990 Unspecified osteoarthritis, unspecified site: Secondary | ICD-10-CM | POA: Insufficient documentation

## 2016-03-11 DIAGNOSIS — R52 Pain, unspecified: Secondary | ICD-10-CM | POA: Diagnosis not present

## 2016-03-11 DIAGNOSIS — R41841 Cognitive communication deficit: Secondary | ICD-10-CM | POA: Diagnosis not present

## 2016-03-11 DIAGNOSIS — Z79899 Other long term (current) drug therapy: Secondary | ICD-10-CM | POA: Diagnosis not present

## 2016-03-11 DIAGNOSIS — R262 Difficulty in walking, not elsewhere classified: Secondary | ICD-10-CM | POA: Diagnosis not present

## 2016-03-11 DIAGNOSIS — R6889 Other general symptoms and signs: Secondary | ICD-10-CM | POA: Diagnosis not present

## 2016-03-11 DIAGNOSIS — Z7401 Bed confinement status: Secondary | ICD-10-CM | POA: Diagnosis not present

## 2016-03-11 DIAGNOSIS — R531 Weakness: Secondary | ICD-10-CM

## 2016-03-11 DIAGNOSIS — G20 Parkinson's disease: Secondary | ICD-10-CM | POA: Diagnosis not present

## 2016-03-11 DIAGNOSIS — R498 Other voice and resonance disorders: Secondary | ICD-10-CM | POA: Diagnosis not present

## 2016-03-11 DIAGNOSIS — K5700 Diverticulitis of small intestine with perforation and abscess without bleeding: Secondary | ICD-10-CM | POA: Diagnosis not present

## 2016-03-11 LAB — BASIC METABOLIC PANEL
Anion gap: 9 (ref 5–15)
BUN: 10 mg/dL (ref 6–20)
CALCIUM: 8.8 mg/dL — AB (ref 8.9–10.3)
CO2: 25 mmol/L (ref 22–32)
CREATININE: 0.6 mg/dL — AB (ref 0.61–1.24)
Chloride: 104 mmol/L (ref 101–111)
GFR calc non Af Amer: >60 mL/min (ref >60)
Glucose, Bld: 152 mg/dL — ABNORMAL HIGH (ref 65–99)
Potassium: 3.8 mmol/L (ref 3.5–5.1)
Sodium: 138 mmol/L (ref 135–145)

## 2016-03-11 LAB — GLUCOSE, CAPILLARY: Glucose-Capillary: 138 mg/dL — ABNORMAL HIGH (ref 65–99)

## 2016-03-11 LAB — CBC
HCT: 43.8 % (ref 40.0–52.0)
Hemoglobin: 15.2 g/dL (ref 13.0–18.0)
MCH: 30.5 pg (ref 26.0–34.0)
MCHC: 34.6 g/dL (ref 32.0–36.0)
MCV: 88.1 fL (ref 80.0–100.0)
PLATELETS: 174 10*3/uL (ref 150–440)
RBC: 4.97 MIL/uL (ref 4.40–5.90)
RDW: 14.7 % — AB (ref 11.5–14.5)
WBC: 5.8 10*3/uL (ref 3.8–10.6)

## 2016-03-11 LAB — URINALYSIS COMPLETE WITH MICROSCOPIC (ARMC ONLY)
BILIRUBIN URINE: NEGATIVE
Bacteria, UA: NONE SEEN
GLUCOSE, UA: NEGATIVE mg/dL
Hgb urine dipstick: NEGATIVE
KETONES UR: NEGATIVE mg/dL
Nitrite: NEGATIVE
Protein, ur: NEGATIVE mg/dL
SPECIFIC GRAVITY, URINE: 1.011 (ref 1.005–1.030)
Squamous Epithelial / LPF: NONE SEEN
pH: 6 (ref 5.0–8.0)

## 2016-03-11 LAB — TROPONIN I: Troponin I: <0.03 ng/mL (ref <0.03)

## 2016-03-11 MED ORDER — ONDANSETRON HCL 4 MG/2ML IJ SOLN
4.0000 mg | Freq: Once | INTRAMUSCULAR | Status: AC
  Administered 2016-03-11: 4 mg via INTRAVENOUS

## 2016-03-11 MED ORDER — ONDANSETRON HCL 4 MG/2ML IJ SOLN
INTRAMUSCULAR | Status: AC
  Filled 2016-03-11: qty 2

## 2016-03-11 NOTE — Clinical Social Work Note (Signed)
Clinical Social Work Assessment  Patient Details  Name: David Morgan MRN: [id number] Date of Birth: 05-16-1925  Date of referral:  03/11/16               Reason for consult:  Facility Placement                Permission sought to share information with:  Family Supports, Customer service manager Permission granted to share information::  Yes, Verbal Permission Granted  Name::      Bakari Vernet (wife) [phone]  Agency::     Relationship::     Contact Information:     Housing/Transportation Living arrangements for the past 2 months:  Single Family Home Source of Information:  Patient, Spouse Patient Interpreter Needed:  None Criminal Activity/Legal Involvement Pertinent to Current Situation/Hospitalization:  No - Comment as needed Significant Relationships:  Other Family Members, Spouse Lives with:  Spouse Do you feel safe going back to the place where you live?  No Need for family participation in patient care:  Yes (Comment)  Care giving concerns: Pt is unable to live independently at this time.  Social Worker assessment / plan:  CSW received consult for placement concerns. CSW engaged with pt at his bedside with pt's wife present. CSW introduced herself and her role as a Education officer, museum. Pt's wife states that she is no longer able to care for pt by herself. Pt's wife would like to see pt receive rehab at a [facility]. Pt has become more and more dependent with mobility and has frequent spells of weakness. Pt presented as very hopeless and defeated. Pt wishes that he was not in this situation and states he is willing to try anything that will help him. CSW demonstrated understanding and provided emotional support. CSW explained that pt will not be admitted to the hospital, but that [facility] does not require a 3 day inpatient stay for SNF coverage. Pt and pt's wife would prefer for pt to go to [facility], however, they are open to other  facilities as well. CSW faxed out [facility] and will present bed offers to the family as they come in. CSW will continue to follow pt and assist as needed.  Employment status:  Retired Nurse, adult PT Recommendations:  Not assessed at this time Information / Referral to community resources:  [place]  Patient/Family's Response to care: Pt and pt's wife are agreeable to SNF for pt upon d/c.  Patient/Family's Understanding of and Emotional Response to Diagnosis, Current Treatment, and Prognosis: Pt is appreciative of CSW's assistance at this time.  Emotional Assessment Appearance:  Appears stated age Attitude/Demeanor/Rapport:  Other (Cooperative) Affect (typically observed):  Depressed, Frustrated, Hopeless Orientation:  Oriented to Self, Oriented to Place, Oriented to  Time, Oriented to Situation Alcohol / Substance use:  Not Applicable Psych involvement (Current and /or in the community):  No (Comment)  Discharge Needs  Concerns to be addressed:  Discharge Planning Concerns Readmission within the last 30 days:  No Current discharge risk:  Dependent with Mobility Barriers to Discharge:  No Barriers Identified   Georga Kaufmann, LCSWA 03/11/2016, 2:42 PM

## 2016-03-11 NOTE — ED Notes (Signed)
Patient transported to MRI 

## 2016-03-11 NOTE — ED Provider Notes (Addendum)
[facility] Emergency Department Provider Note  Time seen: 1:21 PM  I have reviewed the triage vital signs and the nursing notes.   HISTORY  Chief Complaint Weakness    HPI David Morgan is a 82 y.o. male with a past medical history of hyperlipidemia, hypothyroidism, hypertension, TIA, who presents the emergency department with generalized weakness. According to the wife for the past 3 or 4 months the patient has been getting progressively weaker throughout the day. She states in the mornings the patient is able to get up and walk around using his walker, however by the afternoon he is often too weak to get up and sleeps in the recliner. She states this has been ongoing for several months although worsening slowly. Today she states the patient had generalized weakness this morning and was unable to get up out of bed so she called EMS and they brought him to the emergency department for evaluation. Patient denies any complaints besides generalized weakness. The wife does note his speech is somewhat slurred today but states when he gets weak his speech will slur and that has been ongoing for 3 or 4 months as well. Patient denies any fever, cough, congestion, dysuria, diarrhea or vomiting.     Past Medical History:  Diagnosis Date  . Arthritis   . Cancer ([place])   . Claudication ([place])   . Diverticulitis   . Emphysema/COPD ([place])   . History of bladder cancer   . History of carotid artery stenosis   . History of TIA (transient ischemic attack)    2012--  NO RESIDUAL (PER SCAN HAD A PREVIOUS TIA BEFORE 2012)  . Hyperlipidemia   . Hypertension   . Hypothyroidism   . Lesion of bladder   . Mild asthma    NO INHALER  . Nocturia   . Peripheral vascular disease ([place])   . Short of breath on exertion   . Urgency of urination   . Wears glasses     Patient Active Problem List   Diagnosis Date Noted  . Diverticulitis of intestine with abscess 09/18/2015  .  Diverticulitis of intestine with perforation without bleeding   . Disease of thyroid gland 07/10/2015  . BP (high blood pressure) 07/10/2015  . HLD (hyperlipidemia) 07/10/2015  . Benign fibroma of prostate 08/02/2014    Past Surgical History:  Procedure Laterality Date  . CAROTID ENDARTERECTOMY Right 2005  . CARPAL TUNNEL RELEASE Bilateral 2002  &  2007  . CATARACT EXTRACTION W/ INTRAOCULAR LENS  IMPLANT, BILATERAL    . CYSTOSCOPY W/ RETROGRADES Bilateral 12/26/2013   Procedure: BILATERAL RETROGRADE PYELOGRAM;  Surgeon: Claybon Jabs, MD;  Location: [facility];  Service: Urology;  Laterality: Bilateral;  . CYSTOSCOPY WITH BIOPSY N/A 12/26/2013   Procedure: CYSTOSCOPY WITH BLADDER BIOPSY;  Surgeon: Claybon Jabs, MD;  Location: [facility];  Service: Urology;  Laterality: N/A;  . INGUINAL HERNIA REPAIR  YRS AGO  . LAPAROSCOPIC LYSIS OF ADHESIONS  06/28/2015   Procedure: LAPAROSCOPIC LYSIS OF ADHESIONS;  Surgeon: Clayburn Pert, MD;  Location: [facility];  Service: General;;  . LAPAROSCOPY  06/28/2015   Procedure: LAPAROSCOPY DIAGNOSTIC;  Surgeon: Clayburn Pert, MD;  Location: [facility];  Service: General;;  . LAPAROTOMY N/A 09/03/2015   Procedure: umbilical hernia repair with mesh;  Surgeon: Hubbard Robinson, MD;  Location: [facility];  Service: General;  Laterality: N/A;  . ORIF HIP FRACTURE Left 2008   RETAINED HARDWARE  . RIGHT SHOULDER  SURGERY  2005  . TOTAL KNEE ARTHROPLASTY Right 2004  . TRANSURETHRAL RESECTION OF BLADDER TUMOR  1990  . UMBILICAL HERNIA REPAIR   2009  &  2011    Current Outpatient Rx  . Order #: [id number] Class: Normal  . Order #: [id number] Class: Historical Med  . Order #: [id number] Class: Historical Med  . Order #: [id number] Class: Historical Med  . Order #: [id number] Class: Historical Med  . Order #: [id number] Class: Normal  . Order #: [id number] Class: Historical Med  . Order #: [id number] Class: Print  . Order #: [id number] Class:  Historical Med  . Order #: [id number] Class: Historical Med  . Order #: [id number] Class: Historical Med  . Order #: [id number] Class: Normal  . Order #: [id number] Class: Historical Med  . Order #: [id number] Class: Print  . Order #: [id number] Class: Historical Med    Allergies Adhesive [tape] and Augmentin [amoxicillin-pot clavulanate]  Family History  Problem Relation Age of Onset  . Hypertension Father     Social History Social History  Substance Use Topics  . Smoking status: Former Smoker    Packs/day: 1.00    Years: 40.00    Types: Cigarettes    Quit date: 12/21/1993  . Smokeless tobacco: Never Used  . Alcohol use No     Comment: RARE    Review of Systems Constitutional: Negative for fever. Cardiovascular: Negative for chest pain. Respiratory: Negative for shortness of breath. Gastrointestinal: Negative for abdominal pain, vomiting and diarrhea. Genitourinary: Negative for dysuria. Neurological: Negative for . Denies focal weakness or numbness states generalized weakness. 10-point ROS otherwise negative.  ____________________________________________   PHYSICAL EXAM:  VITAL SIGNS: ED Triage Vitals  Enc Vitals Group     BP 03/11/16 1028 (!) 151/82     Pulse Rate 03/11/16 1028 77     Resp 03/11/16 1028 18     Temp 03/11/16 1028 98.2 F (36.8 C)     Temp Source 03/11/16 1028 Oral     SpO2 03/11/16 1028 96 %     Weight 03/11/16 1028 185 lb (83.9 kg)     Height 03/11/16 1028 5\' 10"  (1.778 m)     Head Circumference --      [facility] --      Pain Score 03/11/16 1029 0     Pain Loc --      Pain Edu? --      Excl. in [place]? --     Constitutional: Alert and oriented. Well appearing and in no distress. Eyes: Normal exam ENT   Head: Normocephalic and atraumatic.   Mouth/Throat: Mucous membranes are moist. Cardiovascular: Normal rate, regular rhythm. No murmur Respiratory: Normal respiratory effort without tachypnea nor retractions. Breath sounds are clear   Gastrointestinal: Soft and nontender. No distention.   Musculoskeletal: Nontender with normal range of motion in all extremities. Neurologic:  Mild slurred speech. No gross focal neurologic deficits. 4+/5 motor in all extremities. Sensation intact and equal in all extremities. Skin:  Skin is warm, dry and intact.  Psychiatric: Mood and affect are normal.  ____________________________________________    EKG  EKG reviewed and interpreted by myself shows normal sinus rhythm at 73 bpm, narrow QRS, normal axis, normal intervals, no concerning ST changes noted.  ____________________________________________    RADIOLOGY  CT head shows no acute abnormality.  ____________________________________________    INITIAL IMPRESSION / ASSESSMENT AND PLAN / ED COURSE  Pertinent labs & imaging results that were available during my care of the patient were reviewed by me and considered in my medical decision making (see  chart for details).  CT head shows no acute abnormality. Patient's labs are largely within normal limits. I discussed this with the wife who states she cannot take the patient home, she can no longer safely care for him at home. Patient has a largely normal medical workup, I do not believe he meets criteria for medical admission. I recommended they follow up with neurology, she states they're currently working with Dr. Melrose Nakayama. However the wife does not believe she can take the patient home, we will consult social work.  Social work has seen the patient. We have ordered physical therapy consultation will be seeing the patient to decide upon placement.  Patient care signed out to Dr. Dineen Kid.   Physical therapy has seen the patient, they stated patient is unable to stand or walk by himself, unable to maintain seated position without falling. They do say the patient appears to fall to his right be time he falls. We'll proceed with an MRI to further  evaluate  ____________________________________________   FINAL CLINICAL IMPRESSION(S) / ED DIAGNOSES  Generalized weakness    Harvest Dark, MD 03/11/16 1518    Harvest Dark, MD 03/11/16 [id number]    Harvest Dark, MD 03/11/16 1556

## 2016-03-11 NOTE — ED Triage Notes (Signed)
Patient presents to the ED via [facility] from home with increased lower extremity weakness over the past few days and multiple falls over the past month.  Patient was diagnosed with sundowners recently and lives at home with his wife.  This morning patient had increased difficulty getting up out of bed and moving.  Patient reports difficulty speaking over the past two days.  Wife states, "it's been more than the past few days, any time he gets weak and shaky, he sounds like a drunk."  Patient's wife states, "he woke me up this morning hollering four and one is five.  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  Speech is difficulty to understand.

## 2016-03-11 NOTE — ED Notes (Signed)
Pts wife wished to speak w/ MD Paduchowski, Paduchowski informed and went to pt room.

## 2016-03-11 NOTE — Evaluation (Signed)
Physical Therapy Evaluation Patient Details Name: David Morgan MRN: [id number] DOB: 1925-08-05 Today's Date: 03/11/2016   History of Present Illness  presented to ER secondary to progressive LE weakness, inability to ambulate/get up from chair and slurred speech; wife endorses multiple falls over past few months, worsening in recent weeks.  Head CT negative for acute change; MD to order MRI after PT evaluation (will follow for results)  Clinical Impression  Upon evaluation, patient alert and oriented to basic information; follows simple commands, though rather HOH.  Speech slurred and dysarthric, requiring slower rate and clear ennunciation for optimal intelligibility.  R UE appears to have mild drift with elevation; bilat UEs with marked dysmetria/ataxia with finger/nose testing.  Bilat LEs grossly symmetrical, strength at least 4-/5; negative babinski, negative clonus.  Denies sensory changes.   Currently requiring max assist +2 for bed mobility and initial sitting balance (improving to cga/close sup for brief periods).  Very poor sitting balance and functional reach with constant assist from therapist required to prevent posterior LOB.  Able to complete sit/stand with RW, mod assist +2, but demonstrates heavy R lateral lean with limited active use/WBing R LE.  Unable to correct/maintain standing balance without assist from therapist.  Unsafe to attempt stepping/gait due to high fall risk. Would benefit from skilled PT to address above deficits and promote optimal return to PLOF; recommend transition to STR upon discharge from acute hospitalization.     Follow Up Recommendations SNF    Equipment Recommendations       Recommendations for Other Services       Precautions / Restrictions Precautions Precautions: Fall Restrictions Weight Bearing Restrictions: No      Mobility  Bed Mobility Overal bed mobility: Needs Assistance;+2 for physical assistance Bed Mobility: Supine to  Sit;Sit to Supine     Supine to sit: Max assist;+2 for physical assistance Sit to supine: Max assist;+2 for physical assistance   General bed mobility comments: poor truncal activation and control noted  Transfers Overall transfer level: Needs assistance Equipment used: Rolling walker (2 wheeled) Transfers: Sit to/from Stand Sit to Stand: Mod assist;+2 physical assistance         General transfer comment: R lateral lean, limited active use/WBing R LE with movement transition adn static stance  Ambulation/Gait             General Gait Details: unsafe/unable  Stairs            Wheelchair Mobility    Modified Rankin (Stroke Patients Only)       Balance Overall balance assessment: Needs assistance Sitting-balance support: No upper extremity supported;Feet supported Sitting balance-Leahy Scale: Poor Sitting balance - Comments: Unable to achieve upright/midline sitting posture without constant physical assist from therapist; absent functional reach or ability to maintain sitting balance with any weight shift outside immediate BOS Postural control: Posterior lean;Right lateral lean Standing balance support: Bilateral upper extremity supported Standing balance-Leahy Scale: Zero Standing balance comment: R lateral lean, mod assist +2                             Pertinent Vitals/Pain Pain Assessment: No/denies pain    Home Living Family/patient expects to be discharged to:: Private residence Living Arrangements: Spouse/significant other Available Help at Discharge: Family;Available PRN/intermittently Type of Home: House Home Access: Ramped entrance     Home Layout: One level Home Equipment: Walker - 2 wheels;Walker - 4 wheels;Bedside commode Additional Comments: RW at home; Rollator  in community    Prior Function           Comments: Sup with RW for limited ambulation of household distances; multiple fall history.  Wife does report functional  decline throughout the day, recently diagnosed with 'sundowners' per chart.     Hand Dominance        Extremity/Trunk Assessment   Upper Extremity Assessment: Generalized weakness (mild R UE drift?, moderate dysmetria bilat UEs with finger/nose testing.  R UE strength 4-/5, L UE strength 4+/5.  Denies sensory deficit)           Lower Extremity Assessment: Generalized weakness (Grossly 4-/5 throughout bilat LEs; denies sensory deficit. No clonus, negative Babinski bilat)         Communication   Communication: HOH (speech slurred and dysarthric)  Cognition Arousal/Alertness: Awake/alert Behavior During Therapy: Impulsive Overall Cognitive Status: Within Functional Limits for tasks assessed                      General Comments      Exercises Other Exercises Other Exercises: Unsupported sitting balance, worked on weight shifting and orientation to midline awareness--with accommodation to position, able to maintain with cga/close sup for brief periods; unable to integrate additional functional activity into sitting position Other Exercises: Sit/stand x2 with RW, mod assist +2--patient with very poor righting responses, standing balance; high risk for falls.  Unsafe/unable to attempt stepping/gait at this time.      Assessment/Plan    PT Assessment Patient needs continued PT services  PT Diagnosis Difficulty walking;Generalized weakness   PT Problem List Decreased strength;Decreased activity tolerance;Decreased balance;Decreased mobility;Decreased coordination;Decreased knowledge of use of DME;Decreased safety awareness;Decreased knowledge of precautions  PT Treatment Interventions DME instruction;Gait training;Stair training;Functional mobility training;Therapeutic activities;Therapeutic exercise;Balance training;Neuromuscular re-education;Patient/family education   PT Goals (Current goals can be found in the Care Plan section) Acute Rehab PT Goals Patient Stated  Goal: to get better PT Goal Formulation: With patient/family Time For Goal Achievement: 03/25/16 Potential to Achieve Goals: Fair    Frequency Min 2X/week   Barriers to discharge Decreased caregiver support      Co-evaluation               End of Session   Activity Tolerance: Patient tolerated treatment well Patient left: in bed;with call bell/phone within reach;with family/visitor present Nurse Communication: Mobility status    Functional Assessment Tool Used: clinical judgement, sitting functional reach Functional Limitation: Mobility: Walking and moving around Mobility: Walking and Moving Around Current Status [id number]): At least 80 percent but less than 100 percent impaired, limited or restricted Mobility: Walking and Moving Around Goal Status [phone]): At least 20 percent but less than 40 percent impaired, limited or restricted    Time: 1521-1543 PT Time Calculation (min) (ACUTE ONLY): 22 min   Charges:         PT G Codes:   PT G-Codes **NOT FOR INPATIENT CLASS** Functional Assessment Tool Used: clinical judgement, sitting functional reach Functional Limitation: Mobility: Walking and moving around Mobility: Walking and Moving Around Current Status [id number]): At least 80 percent but less than 100 percent impaired, limited or restricted Mobility: Walking and Moving Around Goal Status [phone]): At least 20 percent but less than 40 percent impaired, limited or restricted    Jerusalen Mateja H. Owens Shark, PT, DPT, NCS 03/11/16, 4:24 PM [phone]

## 2016-03-11 NOTE — ED Provider Notes (Signed)
Signout from Dr. Kerman Passey to follow-up on this patient's MRI. Patient has been accepted [facility] for physical therapy. Physical Exam  BP (!) 164/85   Pulse 76   Temp 98.2 F (36.8 C)   Resp 20   Ht 5\' 10"  (1.778 m)   Wt 185 lb (83.9 kg)   SpO2 95%   BMI 26.54 kg/m  ----------------------------------------- 6:03 PM on 03/11/2016 -----------------------------------------   Physical Exam Patient resting comfortably at this time. No acute distress. ED Course  Procedures  MDM MRI without any evidence of acute stroke. Chronic ischemic disease evident. He will be dispositioned to [facility]. The family has been updated and her understanding and are willing to comply.       Orbie Pyo, MD 03/11/16 [phone]

## 2016-03-11 NOTE — ED Notes (Signed)
Pt had emesis episode when changing pt.  MD Schaevitz informed

## 2016-03-11 NOTE — ED Notes (Signed)
Blood sugar with EMS was 167

## 2016-03-11 NOTE — Progress Notes (Signed)
Pt received bed offers at [facility], [facility], and [facility]. CSW presented bed offers to the family and pt and his wife have decided that pt will go to [facility]. Pending results from MRI pt will be ready for d/c to [facility] tonight.  Georga Kaufmann, MSW, [place]

## 2016-03-11 NOTE — Clinical Social Work Placement (Signed)
   CLINICAL SOCIAL WORK PLACEMENT  NOTE  Date:  03/11/2016  Patient Details  Name: David Morgan MRN: [id number] Date of Birth: 03-25-1925  Clinical Social Work is seeking post-discharge placement for this patient at the [place] level of care (*CSW will initial, date and re-position this form in  chart as items are completed):  Yes   Patient/family provided with [place] Work Department's list of facilities offering this level of care within the geographic area requested by the patient (or if unable, by the patient's family).  Yes   Patient/family informed of their freedom to choose among providers that offer the needed level of care, that participate in Medicare, Medicaid or managed care program needed by the patient, have an available bed and are willing to accept the patient.  Yes   Patient/family informed of [facility]'s ownership interest in [facility] and [facility], as well as of the fact that they are under no obligation to receive care at these facilities.  PASRR submitted to EDS on       PASRR number received on       Existing PASRR number confirmed on 03/11/16     FL2 transmitted to all facilities in geographic area requested by pt/family on 03/11/16     FL2 transmitted to all facilities within larger geographic area on       Patient informed that his/her managed care company has contracts with or will negotiate with certain facilities, including the following:        Yes   Patient/family informed of bed offers received.  Patient chooses bed at [facility]     Physician recommends and patient chooses bed at      Patient to be transferred to [facility] on 03/11/16.  Patient to be transferred to facility by [facility]     Patient family notified on 03/11/16 of transfer.  Name of family member notified:  Aerion Volker (wife) [phone]     PHYSICIAN Please prepare priority  discharge summary, including medications     Additional Comment: 03/11/16 Kara Mead. Marshell Levan, MSW, [place] 03/11/16   _______________________________________________ Georga Kaufmann, LCSWA 03/11/2016, 5:40 PM

## 2016-03-11 NOTE — Progress Notes (Signed)
Pending being medically cleared for d/c pt will be transported via EMS to [facility] room 503. Family has been informed and is agreeable with plan. RN can call report to [address] nurse at [phone]. CSW signing off as there are no further social work needs at this time. However, CSW will be available should another need arise.  Georga Kaufmann, MSW, [place]

## 2016-03-11 NOTE — NC FL2 (Signed)
[place] LEVEL OF CARE SCREENING TOOL     IDENTIFICATION  Patient Name: David Morgan Birthdate: 1925-04-10 Sex: male Admission Date (Current Location): 03/11/2016  [place] and [place] Number:  Engineering geologist and Address:  [facility], [address] [place],  [address]      Provider Number: [id number]  Attending Physician Name and Address:  Harvest Dark, MD  Relative Name and Phone Number:       Current Level of Care: Hospital Recommended Level of Care: [place] Prior Approval Number:    Date Approved/Denied:   PASRR Number: [id number]  Discharge Plan: SNF    Current Diagnoses: Patient Active Problem List   Diagnosis Date Noted  . Diverticulitis of intestine with abscess 09/18/2015  . Diverticulitis of intestine with perforation without bleeding   . Disease of thyroid gland 07/10/2015  . BP (high blood pressure) 07/10/2015  . HLD (hyperlipidemia) 07/10/2015  . Benign fibroma of prostate 08/02/2014    Orientation RESPIRATION BLADDER Height & Weight     Self, Time, Situation, Place  Normal Continent Weight: 185 lb (83.9 kg) Height:  5\' 10"  (177.8 cm)  BEHAVIORAL SYMPTOMS/MOOD NEUROLOGICAL BOWEL NUTRITION STATUS      Continent Diet (Heart Healthy)  AMBULATORY STATUS COMMUNICATION OF NEEDS Skin   Extensive Assist Verbally Normal                       Personal Care Assistance Level of Assistance  Bathing, Feeding, Dressing Bathing Assistance: Limited assistance Feeding assistance: Independent Dressing Assistance: Limited assistance     Functional Limitations Info  Sight, Hearing, Speech Sight Info: Adequate Hearing Info: Impaired Speech Info: Impaired (Pt is difficult to understand at times)    [place]  PT (By licensed PT), OT (By licensed OT)     PT Frequency: 5 OT Frequency: 5            Contractures      Additional Factors Info   Code Status, Allergies Code Status Info: Full Code Allergies Info: Adhesive Tape, Augmentin Amoxicillin-pot Clavulanate           Current Medications (03/11/2016):  This is the current hospital active medication list No current facility-administered medications for this encounter.    Current Outpatient Prescriptions  Medication Sig Dispense Refill  . acetaminophen (TYLENOL) 500 MG tablet Take 2 tablets (1,000 mg total) by mouth every 6 (six) hours as needed. 30 tablet 0  . amLODipine (NORVASC) 2.5 MG tablet Take 2.5 mg by mouth every morning. Reported on 10/03/2015    . atorvastatin (LIPITOR) 40 MG tablet Take 40 mg by mouth every evening.    . carbidopa-levodopa (SINEMET IR) 25-100 MG tablet Take 1 tablet by mouth 3 (three) times daily.    . carvedilol (COREG) 3.125 MG tablet Take 3.125 mg by mouth 2 (two) times daily with a meal.    . ciprofloxacin (CIPRO) 500 MG tablet Take 1 tablet (500 mg total) by mouth 2 (two) times daily. 180 tablet 0  . gabapentin (NEURONTIN) 100 MG capsule Take 100 mg by mouth 3 (three) times daily.     . Hydrocodone-Acetaminophen (VICODIN) 5-300 MG TABS Take 1 tablet by mouth every 4 (four) hours as needed. 30 each 0  . levothyroxine (SYNTHROID, LEVOTHROID) 137 MCG tablet Take 1 tablet by mouth 1 day or 1 dose.    . levothyroxine (SYNTHROID, LEVOTHROID) 150 MCG tablet Take 150 mcg by mouth every evening.    [name]  losartan (COZAAR) 50 MG tablet Take 50 mg by mouth daily.     . metroNIDAZOLE (FLAGYL) 500 MG tablet Take 1 tablet (500 mg total) by mouth 3 (three) times daily. 270 tablet 0  . Multiple Vitamin (MULTI-VITAMINS) TABS Take 1 tablet by mouth daily.    . ondansetron (ZOFRAN) 4 MG tablet Take 1 tablet (4 mg total) by mouth every 6 (six) hours as needed for nausea. 20 tablet 0  . tamsulosin (FLOMAX) 0.4 MG CAPS capsule Take 0.8 mg by mouth every evening.       Discharge Medications: Please see discharge summary for a list of discharge  medications.  Relevant Imaging Results:  Relevant Lab Results:   Additional Information SSN [id number]  Georga Kaufmann, LCSWA

## 2016-03-11 NOTE — ED Notes (Signed)
PT w/pt.  

## 2016-03-18 DIAGNOSIS — Z5189 Encounter for other specified aftercare: Secondary | ICD-10-CM | POA: Diagnosis not present

## 2016-03-18 DIAGNOSIS — N419 Inflammatory disease of prostate, unspecified: Secondary | ICD-10-CM | POA: Diagnosis not present

## 2016-03-18 DIAGNOSIS — R41841 Cognitive communication deficit: Secondary | ICD-10-CM | POA: Diagnosis not present

## 2016-03-18 DIAGNOSIS — E039 Hypothyroidism, unspecified: Secondary | ICD-10-CM | POA: Diagnosis not present

## 2016-03-18 DIAGNOSIS — R52 Pain, unspecified: Secondary | ICD-10-CM | POA: Diagnosis not present

## 2016-03-18 DIAGNOSIS — M6281 Muscle weakness (generalized): Secondary | ICD-10-CM | POA: Diagnosis not present

## 2016-03-18 DIAGNOSIS — G20 Parkinson's disease: Secondary | ICD-10-CM | POA: Diagnosis not present

## 2016-03-18 DIAGNOSIS — K5700 Diverticulitis of small intestine with perforation and abscess without bleeding: Secondary | ICD-10-CM | POA: Diagnosis not present

## 2016-03-18 DIAGNOSIS — E785 Hyperlipidemia, unspecified: Secondary | ICD-10-CM | POA: Diagnosis not present

## 2016-03-18 DIAGNOSIS — I10 Essential (primary) hypertension: Secondary | ICD-10-CM | POA: Diagnosis not present

## 2016-03-18 DIAGNOSIS — G933 Postviral fatigue syndrome: Secondary | ICD-10-CM | POA: Diagnosis not present

## 2016-03-18 DIAGNOSIS — E784 Other hyperlipidemia: Secondary | ICD-10-CM | POA: Diagnosis not present

## 2016-03-18 DIAGNOSIS — R498 Other voice and resonance disorders: Secondary | ICD-10-CM | POA: Diagnosis not present

## 2016-03-26 DIAGNOSIS — I10 Essential (primary) hypertension: Secondary | ICD-10-CM | POA: Diagnosis not present

## 2016-03-26 DIAGNOSIS — K5753 Diverticulitis of both small and large intestine without perforation or abscess with bleeding: Secondary | ICD-10-CM | POA: Diagnosis not present

## 2016-03-26 DIAGNOSIS — M549 Dorsalgia, unspecified: Secondary | ICD-10-CM | POA: Diagnosis not present

## 2016-03-26 DIAGNOSIS — G933 Postviral fatigue syndrome: Secondary | ICD-10-CM | POA: Diagnosis not present

## 2016-03-26 DIAGNOSIS — G629 Polyneuropathy, unspecified: Secondary | ICD-10-CM | POA: Diagnosis not present

## 2016-03-26 DIAGNOSIS — E039 Hypothyroidism, unspecified: Secondary | ICD-10-CM | POA: Diagnosis not present

## 2016-03-26 DIAGNOSIS — E784 Other hyperlipidemia: Secondary | ICD-10-CM | POA: Diagnosis not present

## 2016-03-27 DIAGNOSIS — R262 Difficulty in walking, not elsewhere classified: Secondary | ICD-10-CM | POA: Diagnosis not present

## 2016-03-27 DIAGNOSIS — R4701 Aphasia: Secondary | ICD-10-CM | POA: Diagnosis not present

## 2016-03-27 DIAGNOSIS — R531 Weakness: Secondary | ICD-10-CM | POA: Diagnosis not present

## 2016-03-27 DIAGNOSIS — R251 Tremor, unspecified: Secondary | ICD-10-CM | POA: Diagnosis not present

## 2016-03-28 ENCOUNTER — Telehealth: Payer: Self-pay

## 2016-03-28 NOTE — Patient Outreach (Signed)
Received a [employer] referral, Called Mr Mckellips back and his wife Massie Maroon answered the phone.  Mr. Pelster is currently in a [place] due to not being able to walk. She is hoping that he will be going home soon.  Has had [facility] services in the past. I asked her if she would like a Education officer, museum give her a call to help with bridging the gap between the nursing facility to home she stated she would.  I will assign Chrystal Land LCSW.

## 2016-04-02 ENCOUNTER — Other Ambulatory Visit: Payer: Self-pay | Admitting: *Deleted

## 2016-04-02 NOTE — Patient Outreach (Addendum)
[facility]) Care Management  04/02/2016  David Morgan [birth year] [id number]   Phone call to patient's wife to assess for social work needs.  Per patient's wife, she was interested in any financial resources to assist with patient's co-pay for continued stay at [facility] as he is in his 22nd day and the current daily co-pay in $150.00 per day. Per patient's spouse, she is unable to manage patient at home at this time due to multiple falls, he is still very weak and not able to stand. Patient in need of total care at this time.  Per patient's spouse, she would rather patient remain at [facility] receiving daily therapy versus taking patient home with a private duty aid to only receive therapy twice per week.  Per patient's spouse, they are currently using money from a prior investment to fund his continued stay.  This social worker recommended that once they have spent down that asset, they would then need to apply for long term medicaid.  Per patient's spouse, she is aware of this and plans to apply for Medicaid once they have completed the spend down process for long term care. They have also spoken with the discharge planner on the unit [facility].   Plan:  This social worker's contact information provided for assistance in the event that patient's long term care plan changes.   Sheralyn Boatman [facility] Care Management [phone]

## 2016-04-04 DIAGNOSIS — G933 Postviral fatigue syndrome: Secondary | ICD-10-CM | POA: Diagnosis not present

## 2016-04-04 DIAGNOSIS — M545 Low back pain: Secondary | ICD-10-CM | POA: Diagnosis not present

## 2016-04-04 DIAGNOSIS — E784 Other hyperlipidemia: Secondary | ICD-10-CM | POA: Diagnosis not present

## 2016-04-04 DIAGNOSIS — G629 Polyneuropathy, unspecified: Secondary | ICD-10-CM | POA: Diagnosis not present

## 2016-04-04 DIAGNOSIS — E039 Hypothyroidism, unspecified: Secondary | ICD-10-CM | POA: Diagnosis not present

## 2016-04-04 DIAGNOSIS — I10 Essential (primary) hypertension: Secondary | ICD-10-CM | POA: Diagnosis not present

## 2016-04-14 DIAGNOSIS — E785 Hyperlipidemia, unspecified: Secondary | ICD-10-CM | POA: Diagnosis not present

## 2016-04-14 DIAGNOSIS — K5792 Diverticulitis of intestine, part unspecified, without perforation or abscess without bleeding: Secondary | ICD-10-CM | POA: Diagnosis not present

## 2016-04-14 DIAGNOSIS — J449 Chronic obstructive pulmonary disease, unspecified: Secondary | ICD-10-CM | POA: Diagnosis not present

## 2016-04-14 DIAGNOSIS — R41841 Cognitive communication deficit: Secondary | ICD-10-CM | POA: Diagnosis not present

## 2016-04-14 DIAGNOSIS — E039 Hypothyroidism, unspecified: Secondary | ICD-10-CM | POA: Diagnosis not present

## 2016-04-14 DIAGNOSIS — E1140 Type 2 diabetes mellitus with diabetic neuropathy, unspecified: Secondary | ICD-10-CM | POA: Diagnosis not present

## 2016-04-14 DIAGNOSIS — E119 Type 2 diabetes mellitus without complications: Secondary | ICD-10-CM | POA: Diagnosis not present

## 2016-04-14 DIAGNOSIS — E1151 Type 2 diabetes mellitus with diabetic peripheral angiopathy without gangrene: Secondary | ICD-10-CM | POA: Diagnosis not present

## 2016-04-14 DIAGNOSIS — I10 Essential (primary) hypertension: Secondary | ICD-10-CM | POA: Diagnosis not present

## 2016-04-14 DIAGNOSIS — M6281 Muscle weakness (generalized): Secondary | ICD-10-CM | POA: Diagnosis not present

## 2016-04-17 DIAGNOSIS — M6281 Muscle weakness (generalized): Secondary | ICD-10-CM | POA: Diagnosis not present

## 2016-04-17 DIAGNOSIS — E1140 Type 2 diabetes mellitus with diabetic neuropathy, unspecified: Secondary | ICD-10-CM | POA: Diagnosis not present

## 2016-04-17 DIAGNOSIS — E785 Hyperlipidemia, unspecified: Secondary | ICD-10-CM | POA: Diagnosis not present

## 2016-04-17 DIAGNOSIS — E039 Hypothyroidism, unspecified: Secondary | ICD-10-CM | POA: Diagnosis not present

## 2016-04-17 DIAGNOSIS — I10 Essential (primary) hypertension: Secondary | ICD-10-CM | POA: Diagnosis not present

## 2016-04-17 DIAGNOSIS — R41841 Cognitive communication deficit: Secondary | ICD-10-CM | POA: Diagnosis not present

## 2016-04-17 DIAGNOSIS — K5792 Diverticulitis of intestine, part unspecified, without perforation or abscess without bleeding: Secondary | ICD-10-CM | POA: Diagnosis not present

## 2016-04-17 DIAGNOSIS — E1151 Type 2 diabetes mellitus with diabetic peripheral angiopathy without gangrene: Secondary | ICD-10-CM | POA: Diagnosis not present

## 2016-04-17 DIAGNOSIS — J449 Chronic obstructive pulmonary disease, unspecified: Secondary | ICD-10-CM | POA: Diagnosis not present

## 2016-04-18 DIAGNOSIS — R262 Difficulty in walking, not elsewhere classified: Secondary | ICD-10-CM | POA: Diagnosis not present

## 2016-04-18 DIAGNOSIS — R531 Weakness: Secondary | ICD-10-CM | POA: Diagnosis not present

## 2016-04-18 DIAGNOSIS — K5720 Diverticulitis of large intestine with perforation and abscess without bleeding: Secondary | ICD-10-CM | POA: Diagnosis not present

## 2016-04-18 DIAGNOSIS — R251 Tremor, unspecified: Secondary | ICD-10-CM | POA: Diagnosis not present

## 2016-04-18 DIAGNOSIS — I10 Essential (primary) hypertension: Secondary | ICD-10-CM | POA: Diagnosis not present

## 2016-04-22 DIAGNOSIS — I10 Essential (primary) hypertension: Secondary | ICD-10-CM | POA: Diagnosis not present

## 2016-04-22 DIAGNOSIS — E1140 Type 2 diabetes mellitus with diabetic neuropathy, unspecified: Secondary | ICD-10-CM | POA: Diagnosis not present

## 2016-04-22 DIAGNOSIS — M6281 Muscle weakness (generalized): Secondary | ICD-10-CM | POA: Diagnosis not present

## 2016-04-22 DIAGNOSIS — R41841 Cognitive communication deficit: Secondary | ICD-10-CM | POA: Diagnosis not present

## 2016-04-22 DIAGNOSIS — J449 Chronic obstructive pulmonary disease, unspecified: Secondary | ICD-10-CM | POA: Diagnosis not present

## 2016-04-22 DIAGNOSIS — E1151 Type 2 diabetes mellitus with diabetic peripheral angiopathy without gangrene: Secondary | ICD-10-CM | POA: Diagnosis not present

## 2016-04-22 DIAGNOSIS — K5792 Diverticulitis of intestine, part unspecified, without perforation or abscess without bleeding: Secondary | ICD-10-CM | POA: Diagnosis not present

## 2016-04-22 DIAGNOSIS — E039 Hypothyroidism, unspecified: Secondary | ICD-10-CM | POA: Diagnosis not present

## 2016-04-22 DIAGNOSIS — E785 Hyperlipidemia, unspecified: Secondary | ICD-10-CM | POA: Diagnosis not present

## 2016-04-25 DIAGNOSIS — H532 Diplopia: Secondary | ICD-10-CM | POA: Diagnosis not present

## 2016-04-29 DIAGNOSIS — I10 Essential (primary) hypertension: Secondary | ICD-10-CM | POA: Diagnosis not present

## 2016-04-29 DIAGNOSIS — J449 Chronic obstructive pulmonary disease, unspecified: Secondary | ICD-10-CM | POA: Diagnosis not present

## 2016-04-29 DIAGNOSIS — E1140 Type 2 diabetes mellitus with diabetic neuropathy, unspecified: Secondary | ICD-10-CM | POA: Diagnosis not present

## 2016-04-29 DIAGNOSIS — E785 Hyperlipidemia, unspecified: Secondary | ICD-10-CM | POA: Diagnosis not present

## 2016-04-29 DIAGNOSIS — M6281 Muscle weakness (generalized): Secondary | ICD-10-CM | POA: Diagnosis not present

## 2016-04-29 DIAGNOSIS — K5792 Diverticulitis of intestine, part unspecified, without perforation or abscess without bleeding: Secondary | ICD-10-CM | POA: Diagnosis not present

## 2016-04-29 DIAGNOSIS — E1151 Type 2 diabetes mellitus with diabetic peripheral angiopathy without gangrene: Secondary | ICD-10-CM | POA: Diagnosis not present

## 2016-04-29 DIAGNOSIS — E039 Hypothyroidism, unspecified: Secondary | ICD-10-CM | POA: Diagnosis not present

## 2016-04-29 DIAGNOSIS — R41841 Cognitive communication deficit: Secondary | ICD-10-CM | POA: Diagnosis not present

## 2016-05-02 ENCOUNTER — Encounter: Payer: Self-pay | Admitting: General Surgery

## 2016-05-02 ENCOUNTER — Ambulatory Visit (INDEPENDENT_AMBULATORY_CARE_PROVIDER_SITE_OTHER): Payer: PPO | Admitting: General Surgery

## 2016-05-02 VITALS — BP 163/80 | HR 73 | Temp 98.0°F | Ht 70.0 in | Wt 176.8 lb

## 2016-05-02 DIAGNOSIS — I10 Essential (primary) hypertension: Secondary | ICD-10-CM | POA: Diagnosis not present

## 2016-05-02 DIAGNOSIS — M6281 Muscle weakness (generalized): Secondary | ICD-10-CM | POA: Diagnosis not present

## 2016-05-02 DIAGNOSIS — R41841 Cognitive communication deficit: Secondary | ICD-10-CM | POA: Diagnosis not present

## 2016-05-02 DIAGNOSIS — E039 Hypothyroidism, unspecified: Secondary | ICD-10-CM | POA: Diagnosis not present

## 2016-05-02 DIAGNOSIS — K5792 Diverticulitis of intestine, part unspecified, without perforation or abscess without bleeding: Secondary | ICD-10-CM | POA: Diagnosis not present

## 2016-05-02 DIAGNOSIS — Z8719 Personal history of other diseases of the digestive system: Secondary | ICD-10-CM | POA: Insufficient documentation

## 2016-05-02 DIAGNOSIS — E1140 Type 2 diabetes mellitus with diabetic neuropathy, unspecified: Secondary | ICD-10-CM | POA: Diagnosis not present

## 2016-05-02 DIAGNOSIS — J449 Chronic obstructive pulmonary disease, unspecified: Secondary | ICD-10-CM | POA: Diagnosis not present

## 2016-05-02 DIAGNOSIS — E1151 Type 2 diabetes mellitus with diabetic peripheral angiopathy without gangrene: Secondary | ICD-10-CM | POA: Diagnosis not present

## 2016-05-02 DIAGNOSIS — E785 Hyperlipidemia, unspecified: Secondary | ICD-10-CM | POA: Diagnosis not present

## 2016-05-02 NOTE — Progress Notes (Signed)
Outpatient Surgical Follow Up  05/02/2016  David Morgan is an 82 y.o. male.   Chief Complaint  Patient presents with  . Follow-up    Diverticulitis    HPI: 82 year old male returns to clinic for follow-up from diverticulitis. He was treated with a prolonged course of antibiotics. He came off his antibiotics approximately 2 weeks ago without any return of abdominal pain, nausea, vomiting, chest pain, shortness of breath, diarrhea, constipation. He has been symptom-free and feeling well. Since I last saw him he did spend 30 days and [facility] for rehabilitation and has had marked improvement in his mobility and able to care for himself. No other current complaints.  Past Medical History:  Diagnosis Date  . Arthritis   . Cancer ([place])   . Claudication ([place])   . Diverticulitis   . Emphysema/COPD ([place])   . History of bladder cancer   . History of carotid artery stenosis   . History of TIA (transient ischemic attack)    2012--  NO RESIDUAL (PER SCAN HAD A PREVIOUS TIA BEFORE 2012)  . Hyperlipidemia   . Hypertension   . Hypothyroidism   . Lesion of bladder   . Mild asthma    NO INHALER  . Nocturia   . Peripheral vascular disease ([place])   . Short of breath on exertion   . Urgency of urination   . Wears glasses     Past Surgical History:  Procedure Laterality Date  . CAROTID ENDARTERECTOMY Right 2005  . CARPAL TUNNEL RELEASE Bilateral 2002  &  2007  . CATARACT EXTRACTION W/ INTRAOCULAR LENS  IMPLANT, BILATERAL    . CYSTOSCOPY W/ RETROGRADES Bilateral 12/26/2013   Procedure: BILATERAL RETROGRADE PYELOGRAM;  Surgeon: Claybon Jabs, MD;  Location: [facility];  Service: Urology;  Laterality: Bilateral;  . CYSTOSCOPY WITH BIOPSY N/A 12/26/2013   Procedure: CYSTOSCOPY WITH BLADDER BIOPSY;  Surgeon: Claybon Jabs, MD;  Location: [facility];  Service: Urology;  Laterality: N/A;  . INGUINAL HERNIA REPAIR  YRS AGO  . LAPAROSCOPIC LYSIS OF  ADHESIONS  06/28/2015   Procedure: LAPAROSCOPIC LYSIS OF ADHESIONS;  Surgeon: Clayburn Pert, MD;  Location: [facility];  Service: General;;  . LAPAROSCOPY  06/28/2015   Procedure: LAPAROSCOPY DIAGNOSTIC;  Surgeon: Clayburn Pert, MD;  Location: [facility];  Service: General;;  . LAPAROTOMY N/A 09/03/2015   Procedure: umbilical hernia repair with mesh;  Surgeon: Hubbard Robinson, MD;  Location: [facility];  Service: General;  Laterality: N/A;  . ORIF HIP FRACTURE Left 2008   RETAINED HARDWARE  . RIGHT SHOULDER  SURGERY  2005  . TOTAL KNEE ARTHROPLASTY Right 2004  . TRANSURETHRAL RESECTION OF BLADDER TUMOR  1990  . UMBILICAL HERNIA REPAIR   2009  &  2011    Family History  Problem Relation Age of Onset  . Hypertension Father     Social History:  reports that he quit smoking about 22 years ago. His smoking use included Cigarettes. He has a 40.00 pack-year smoking history. He has never used smokeless tobacco. He reports that he does not drink alcohol or use drugs.  Allergies:  Allergies  Allergen Reactions  . Adhesive [Tape] Rash  . Augmentin [Amoxicillin-Pot Clavulanate] Itching, Rash and Other (See Comments)    Has patient had a PCN reaction causing immediate rash, facial/tongue/throat swelling, SOB or lightheadedness with hypotension: No Has patient had a PCN reaction causing severe rash involving mucus membranes or skin necrosis: No Has patient had a  PCN reaction that required hospitalization No Has patient had a PCN reaction occurring within the last 10 years: No If all of the above answers are "NO", then may proceed with Cephalosporin use.     Medications reviewed.    ROS A multipoint review of systems was completed. All pertinent positives and negatives are documented within the history of present illness and remainder are negative.   BP (!) 163/80   Pulse 73   Temp 98 F (36.7 C) (Oral)   Ht 5\' 10"  (1.778 m)   Wt 80.2 kg (176 lb 12.8 oz)   BMI 25.37 kg/m    Physical Exam Gen.: No acute distress Neck: Supple and nontender Chest: Clear to auscultation Heart: Regular in rhythm Abdomen: Soft, nontender, nondistended. Extremities: Moves all extremities well. No evidence of edema.    No results found for this or any previous visit (from the past 48 hour(s)). No results found.  Assessment/Plan:  1. History of diverticulitis 82 year old male with a history of diverticulitis that required drainage last winter. Was treated with a prolonged course of oral antibiotics and is a complete resolution of symptoms. Discussed at length with the patient and his wife that should there be any concern for return of his symptoms that they are to return to clinic immediately for further evaluation. They voiced understanding and will follow-up in clinic on an as-needed basis.  Total of 15 minutes was spent for this encounter with greater than 50% of it being used for counseling and coordination of care.     Clayburn Pert, MD [facility] General Surgeon  05/02/2016,11:49 AM

## 2016-05-02 NOTE — Patient Instructions (Signed)
Please call our office if you have any questions or concerns.  

## 2016-05-05 DIAGNOSIS — R41841 Cognitive communication deficit: Secondary | ICD-10-CM | POA: Diagnosis not present

## 2016-05-05 DIAGNOSIS — K5792 Diverticulitis of intestine, part unspecified, without perforation or abscess without bleeding: Secondary | ICD-10-CM | POA: Diagnosis not present

## 2016-05-05 DIAGNOSIS — E785 Hyperlipidemia, unspecified: Secondary | ICD-10-CM | POA: Diagnosis not present

## 2016-05-05 DIAGNOSIS — E1151 Type 2 diabetes mellitus with diabetic peripheral angiopathy without gangrene: Secondary | ICD-10-CM | POA: Diagnosis not present

## 2016-05-05 DIAGNOSIS — E039 Hypothyroidism, unspecified: Secondary | ICD-10-CM | POA: Diagnosis not present

## 2016-05-05 DIAGNOSIS — M6281 Muscle weakness (generalized): Secondary | ICD-10-CM | POA: Diagnosis not present

## 2016-05-05 DIAGNOSIS — E1140 Type 2 diabetes mellitus with diabetic neuropathy, unspecified: Secondary | ICD-10-CM | POA: Diagnosis not present

## 2016-05-05 DIAGNOSIS — J449 Chronic obstructive pulmonary disease, unspecified: Secondary | ICD-10-CM | POA: Diagnosis not present

## 2016-05-05 DIAGNOSIS — I10 Essential (primary) hypertension: Secondary | ICD-10-CM | POA: Diagnosis not present

## 2016-05-06 ENCOUNTER — Ambulatory Visit: Payer: Self-pay | Admitting: Surgery

## 2016-05-23 IMAGING — CT CT ABD-PELV W/ CM
1 of 3 series · 12 of 32 positions shown, 17 images · IV contrast (omnipaque)
Comparison: CT dated 08/06/2015 and 06/28/2015

CLINICAL DATA: 89-year-old male with history of hernia repair as
well as history of diverticulitis. Abdominal pain.

EXAM:
CT ABDOMEN AND PELVIS WITH CONTRAST
TECHNIQUE: Multidetector CT imaging of the abdomen and pelvis was performed
using the standard protocol following bolus administration of
intravenous contrast.
CONTRAST:  100mL OMNIPAQUE IOHEXOL 300 MG/ML  SOLN

[Series 2: routine abd pel with · axial · 0.85mm/px · z∈[-530,-130]mm · 12 of 92 slices shown, 17 images]
[im 6/92  soft-tissue]
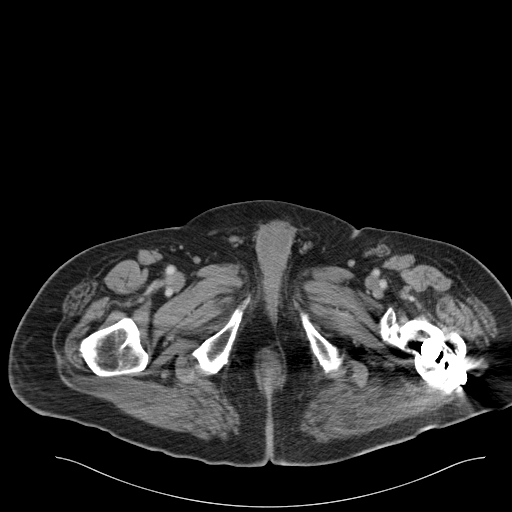
[im 6/92  bone]
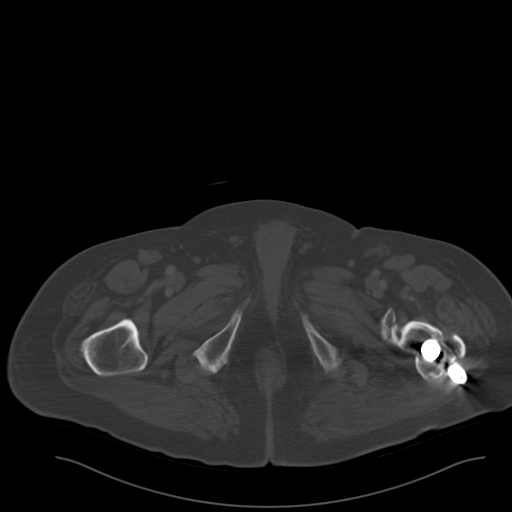
[im 16/92  soft-tissue]
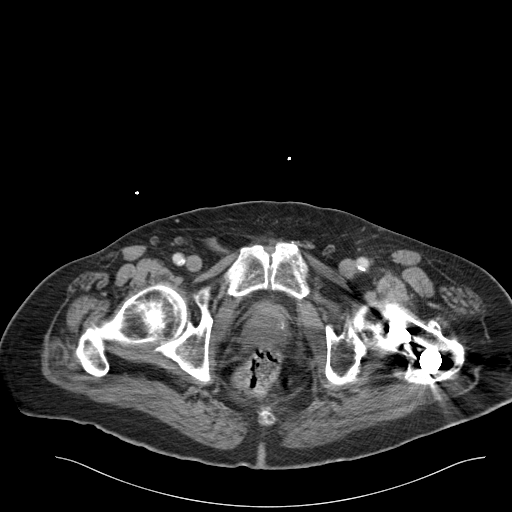
[im 21/92  soft-tissue]
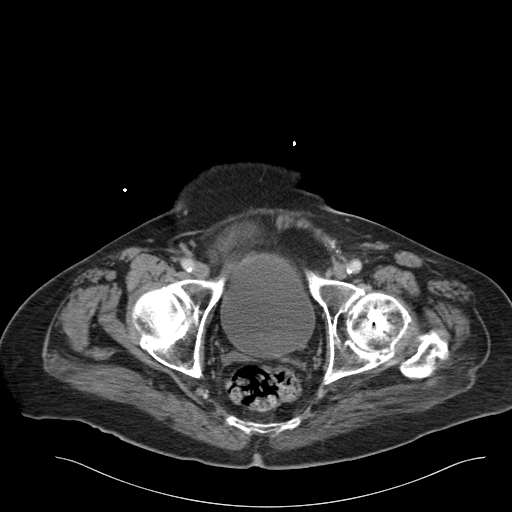
[im 31/92  soft-tissue]
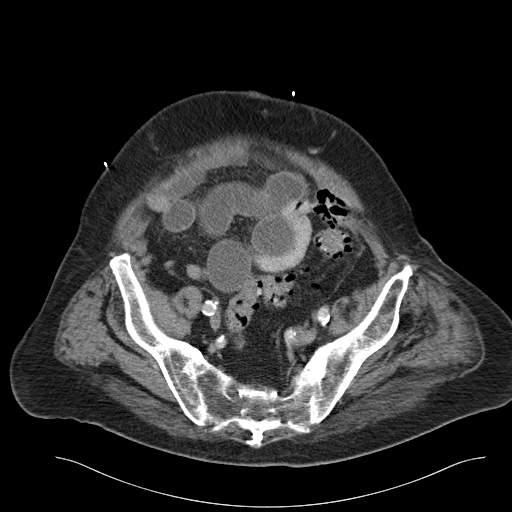
[im 36/92  soft-tissue]
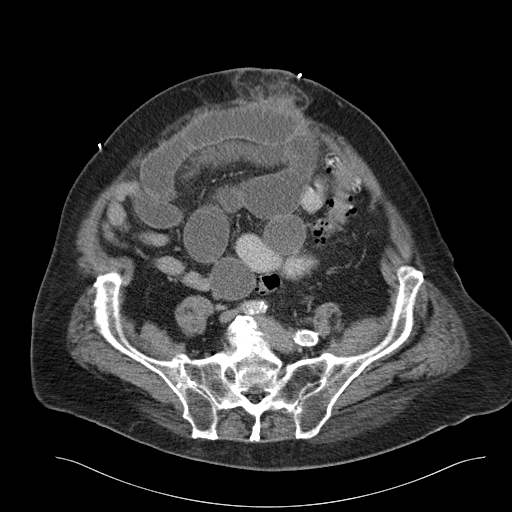
[im 46/92  soft-tissue]
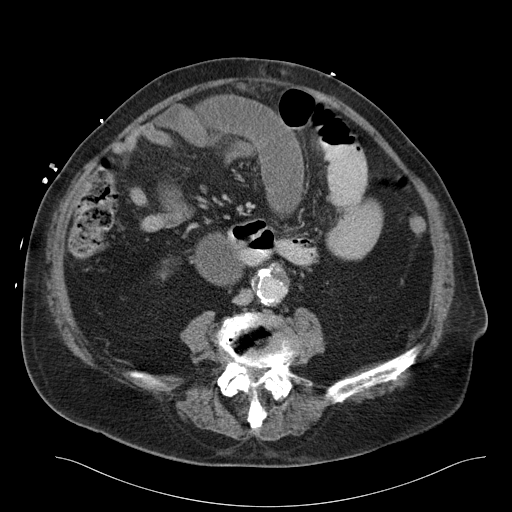
[im 56/92  soft-tissue]
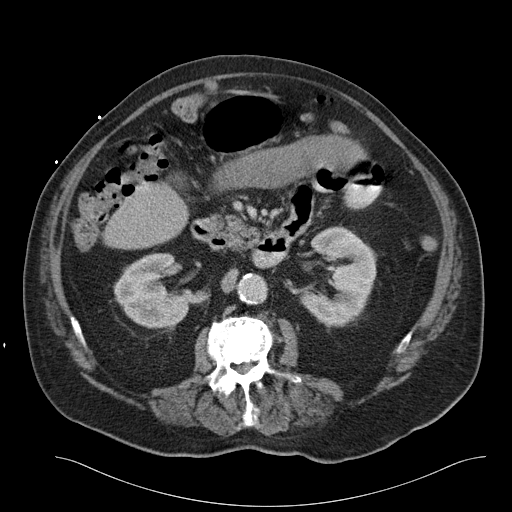
[im 61/92  soft-tissue]
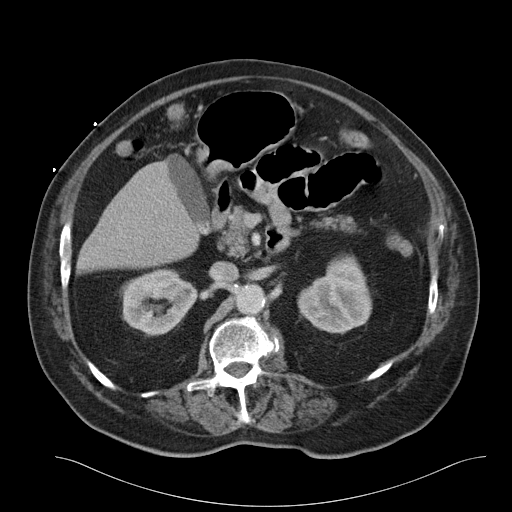
[im 71/92  soft-tissue]
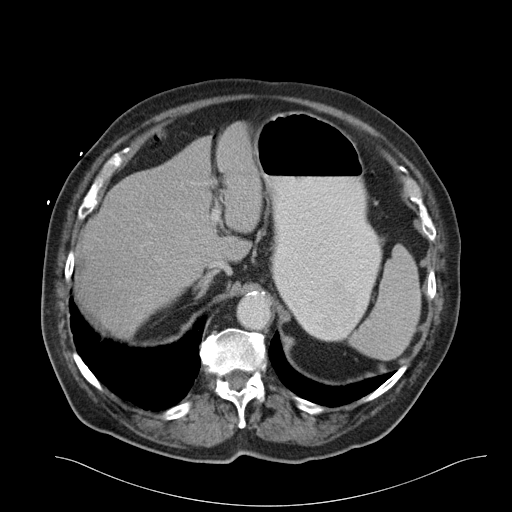
[im 71/92  lung]
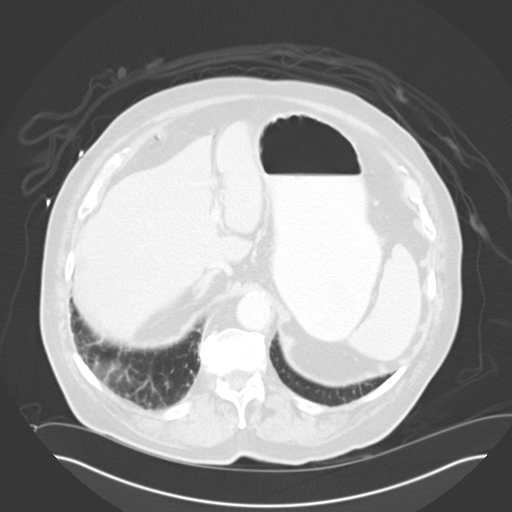
[im 71/92  bone]
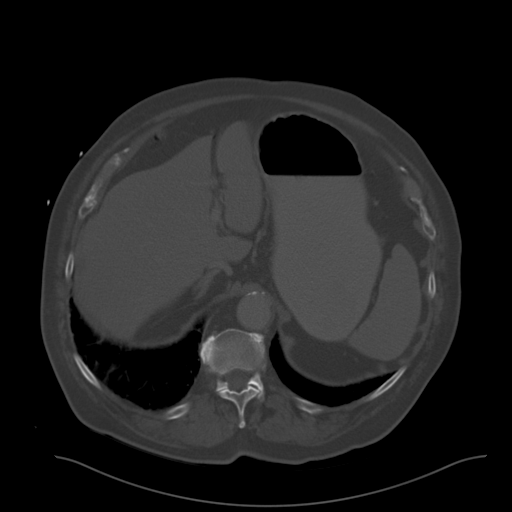
[im 76/92  soft-tissue]
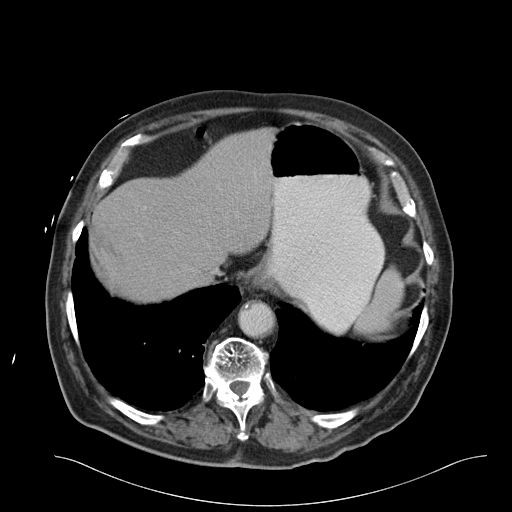
[im 76/92  lung]
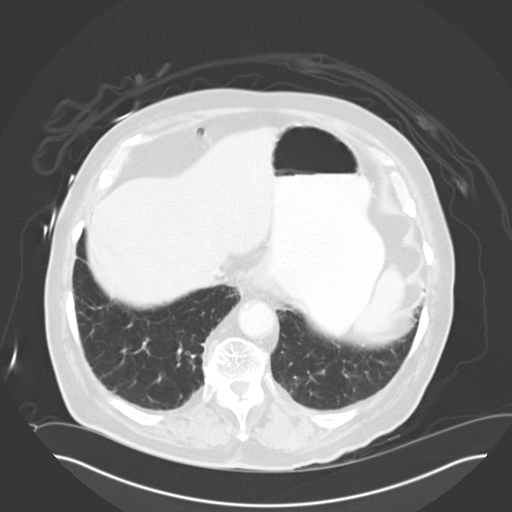
[im 81/92  lung]
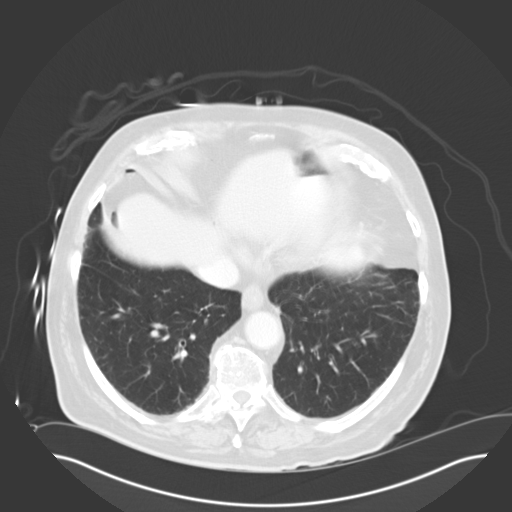
[im 86/92  soft-tissue]
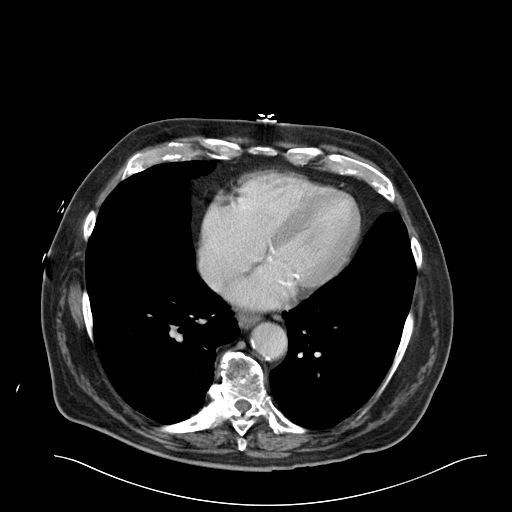
[im 86/92  lung]
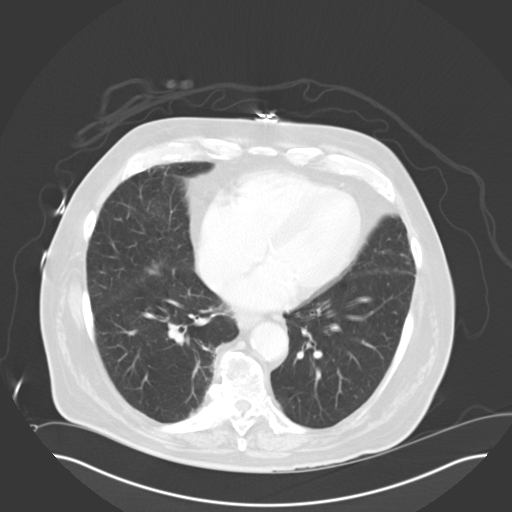

[12 of 32 positions shown; findings below may reference images not displayed]

FINDINGS: The visualized lung bases are clear. There is coronary vascular
calcification.

Small scattered pockets of intraperitoneal air noted in the anterior
and upper abdomen. There has been interval increase in the
pneumoperitoneum compared to the prior study. No free fluid.

There is stone within the neck of the gallbladder. No
pericholecystic fluid. The liver, pancreas, spleen, adrenal glands
appear unremarkable. Small stable right renal hypodense lesions are
not well characterized but may represent cysts. Ultrasound is
recommended for further evaluation. There is no hydronephrosis on
either side. The visualized ureters and urinary bladder appear
unremarkable. The prostate and seminal vesicles are grossly
unremarkable.

There is extensive sigmoid diverticulosis with muscular hypertrophy.
There is focal abutment of the sigmoid colon to the anterior
peritoneal wall compatible with adhesions. No definite active
inflammatory changes identified. There is a tract like structure
extending from the sigmoid colon along the left anterior pelvic wall
which may represent residual air within previously seen perisigmoid
diverticular abscess or represent a fistulous tract.

There is a small supraumbilical hernia with focal herniation of
small bowel and resulting small bowel obstruction. A small fat
containing umbilical hernia is noted. A small left periumbilical
hernia is also seen containing small amount of fat with minimal
protrusion of a small bowel wall. There is stranding of the
periumbilical subcutaneous fat compatible with inflammatory changes
and possible strangulation and incarceration of the herniated small
bowel. Normal appendix.

There is aortoiliac atherosclerotic disease. There is a 3.3 cm
infrarenal abdominal aortic aneurysm as seen previously. Follow-up
is recommended. The origins of the celiac axis and SMA appear
patent. The origins of the renal arteries appear patent. The origin
of the IMA appears thrombosed. No portal venous gas identified.
There is no adenopathy. There is osteopenia with extensive
degenerative changes of the spine. Left femoral intra medullary
orthopedic hardware. Multilevel compression fracture, L3
vertebroplasty, and disc desiccation and vacuum phenomena noted. No
acute fracture.
IMPRESSION: Small supraumbilical hernia containing a short segment of small
bowel with findings of strangulation/ incarceration as well as
small-bowel obstruction.

Sigmoid diverticulosis with muscular hypertrophy. A containing tract
like structure extending from the sigmoid along the left anterior
pelvic wall likely represents air within residual perisigmoid
collection versus a fistulous tract.

Pneumoperitoneum, increased from prior study.

These results were called by telephone at the time of interpretation
on 09/03/2015 at [DATE] to Dr. SEIDULLA YEGOROVA , who verbally
acknowledged these results.

## 2016-05-25 IMAGING — CR DG CHEST 1V PORT
1 series · 1 of 1 positions shown · non-contrast
Comparison: 11/22/2007

CLINICAL DATA: Abdominal pain, nausea and vomiting

EXAM:
PORTABLE CHEST 1 VIEW

[ap]
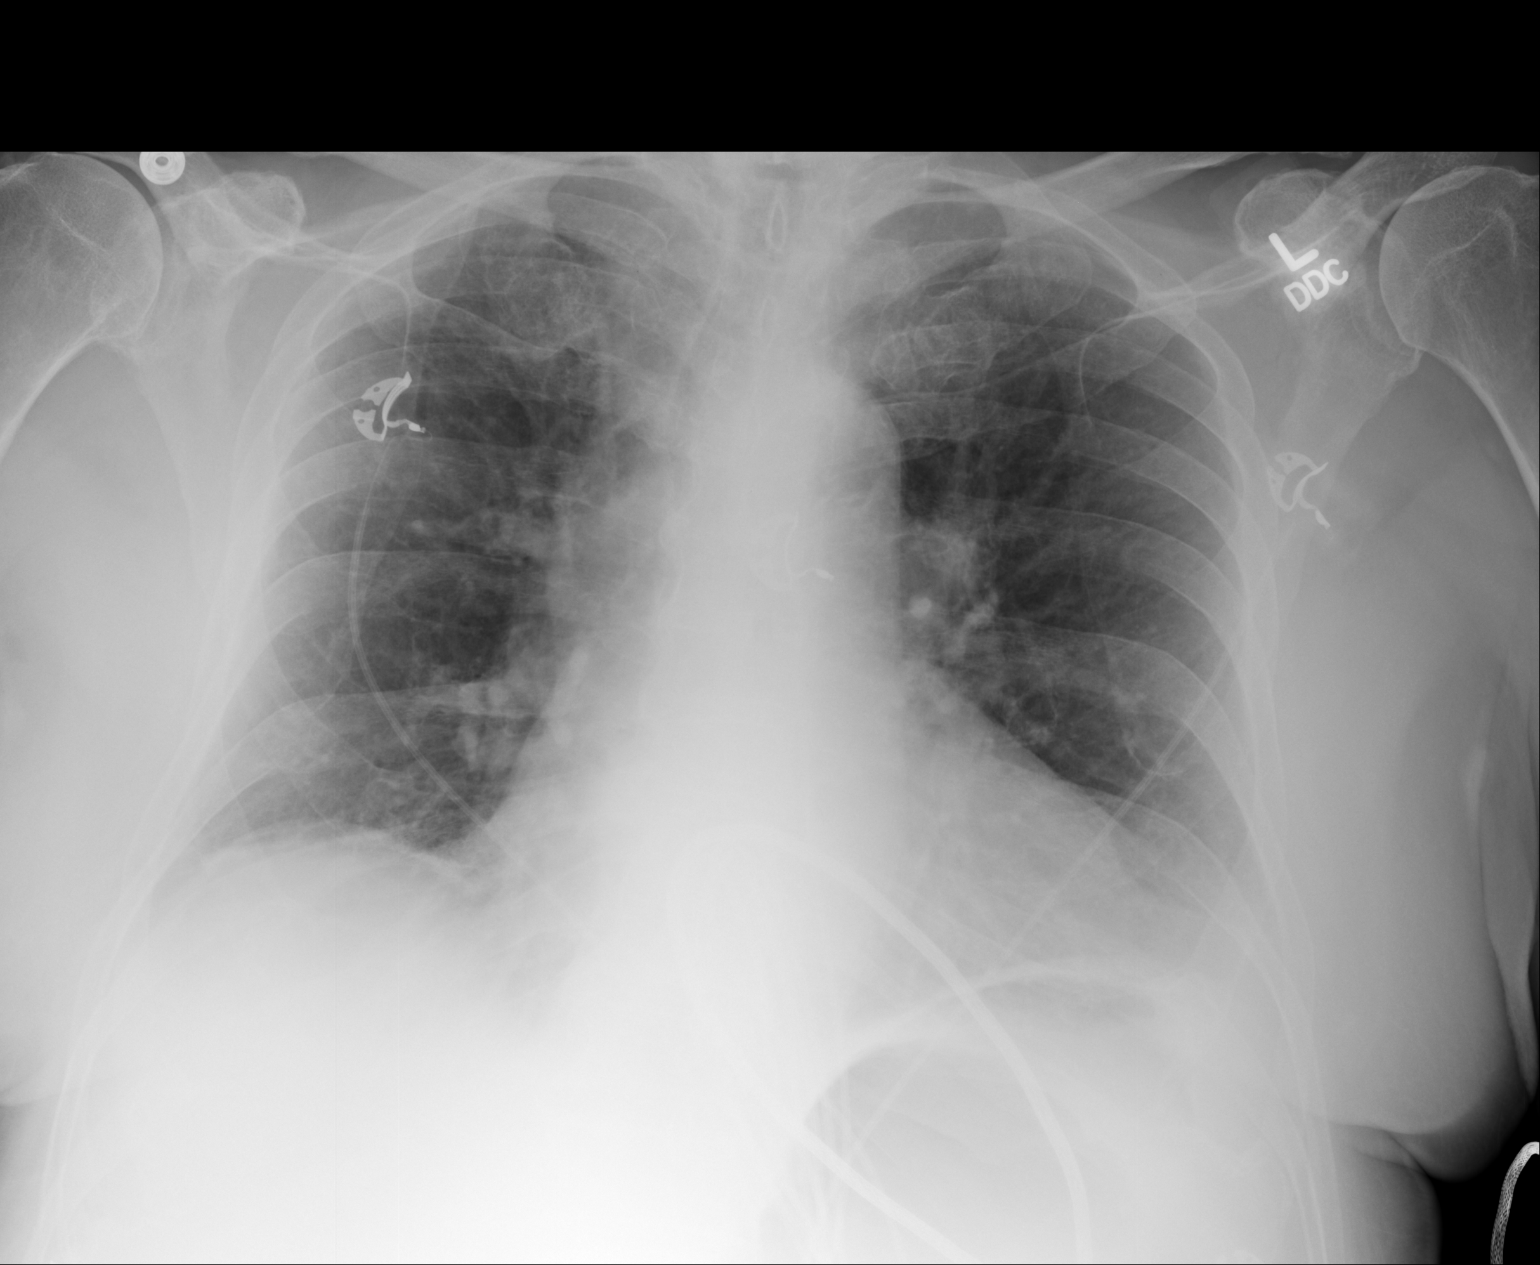

[1 of 1 positions shown; findings below may reference images not displayed]

FINDINGS: Cardiomediastinal silhouette is stable. No infiltrate or pulmonary
edema. Mild basilar atelectasis. Bony thorax is stable.
IMPRESSION: No infiltrate or pulmonary edema.  Bilateral basilar atelectasis.

## 2016-07-16 DIAGNOSIS — R251 Tremor, unspecified: Secondary | ICD-10-CM | POA: Diagnosis not present

## 2016-07-16 DIAGNOSIS — K5720 Diverticulitis of large intestine with perforation and abscess without bleeding: Secondary | ICD-10-CM | POA: Diagnosis not present

## 2016-07-16 DIAGNOSIS — I10 Essential (primary) hypertension: Secondary | ICD-10-CM | POA: Diagnosis not present

## 2016-07-16 DIAGNOSIS — R531 Weakness: Secondary | ICD-10-CM | POA: Diagnosis not present

## 2016-07-16 DIAGNOSIS — R262 Difficulty in walking, not elsewhere classified: Secondary | ICD-10-CM | POA: Diagnosis not present

## 2016-08-01 DIAGNOSIS — I10 Essential (primary) hypertension: Secondary | ICD-10-CM | POA: Diagnosis not present

## 2016-08-01 DIAGNOSIS — Z0000 Encounter for general adult medical examination without abnormal findings: Secondary | ICD-10-CM | POA: Diagnosis not present

## 2016-08-01 DIAGNOSIS — G5790 Unspecified mononeuropathy of unspecified lower limb: Secondary | ICD-10-CM | POA: Diagnosis not present

## 2016-08-01 DIAGNOSIS — E079 Disorder of thyroid, unspecified: Secondary | ICD-10-CM | POA: Diagnosis not present

## 2016-08-01 DIAGNOSIS — R262 Difficulty in walking, not elsewhere classified: Secondary | ICD-10-CM | POA: Diagnosis not present

## 2016-08-01 DIAGNOSIS — D696 Thrombocytopenia, unspecified: Secondary | ICD-10-CM | POA: Diagnosis not present

## 2016-08-01 DIAGNOSIS — K5720 Diverticulitis of large intestine with perforation and abscess without bleeding: Secondary | ICD-10-CM | POA: Diagnosis not present

## 2016-08-01 DIAGNOSIS — R251 Tremor, unspecified: Secondary | ICD-10-CM | POA: Diagnosis not present

## 2016-12-05 DIAGNOSIS — K5720 Diverticulitis of large intestine with perforation and abscess without bleeding: Secondary | ICD-10-CM | POA: Diagnosis not present

## 2016-12-05 DIAGNOSIS — R251 Tremor, unspecified: Secondary | ICD-10-CM | POA: Diagnosis not present

## 2016-12-05 DIAGNOSIS — E079 Disorder of thyroid, unspecified: Secondary | ICD-10-CM | POA: Diagnosis not present

## 2016-12-05 DIAGNOSIS — I10 Essential (primary) hypertension: Secondary | ICD-10-CM | POA: Diagnosis not present

## 2016-12-05 DIAGNOSIS — R262 Difficulty in walking, not elsewhere classified: Secondary | ICD-10-CM | POA: Diagnosis not present

## 2016-12-05 DIAGNOSIS — D696 Thrombocytopenia, unspecified: Secondary | ICD-10-CM | POA: Diagnosis not present

## 2016-12-05 DIAGNOSIS — Z125 Encounter for screening for malignant neoplasm of prostate: Secondary | ICD-10-CM | POA: Diagnosis not present

## 2016-12-05 DIAGNOSIS — G5790 Unspecified mononeuropathy of unspecified lower limb: Secondary | ICD-10-CM | POA: Diagnosis not present

## 2016-12-12 DIAGNOSIS — Z0000 Encounter for general adult medical examination without abnormal findings: Secondary | ICD-10-CM | POA: Diagnosis not present

## 2016-12-12 DIAGNOSIS — D696 Thrombocytopenia, unspecified: Secondary | ICD-10-CM | POA: Diagnosis not present

## 2016-12-12 DIAGNOSIS — I10 Essential (primary) hypertension: Secondary | ICD-10-CM | POA: Diagnosis not present

## 2016-12-12 DIAGNOSIS — R739 Hyperglycemia, unspecified: Secondary | ICD-10-CM | POA: Diagnosis not present

## 2016-12-12 DIAGNOSIS — E7800 Pure hypercholesterolemia, unspecified: Secondary | ICD-10-CM | POA: Diagnosis not present

## 2016-12-12 DIAGNOSIS — E079 Disorder of thyroid, unspecified: Secondary | ICD-10-CM | POA: Diagnosis not present

## 2016-12-12 DIAGNOSIS — R262 Difficulty in walking, not elsewhere classified: Secondary | ICD-10-CM | POA: Diagnosis not present

## 2016-12-12 DIAGNOSIS — K5720 Diverticulitis of large intestine with perforation and abscess without bleeding: Secondary | ICD-10-CM | POA: Diagnosis not present

## 2016-12-12 DIAGNOSIS — Z23 Encounter for immunization: Secondary | ICD-10-CM | POA: Diagnosis not present

## 2017-03-02 DIAGNOSIS — L812 Freckles: Secondary | ICD-10-CM | POA: Diagnosis not present

## 2017-03-02 DIAGNOSIS — L821 Other seborrheic keratosis: Secondary | ICD-10-CM | POA: Diagnosis not present

## 2017-03-02 DIAGNOSIS — L578 Other skin changes due to chronic exposure to nonionizing radiation: Secondary | ICD-10-CM | POA: Diagnosis not present

## 2017-03-02 DIAGNOSIS — Z1283 Encounter for screening for malignant neoplasm of skin: Secondary | ICD-10-CM | POA: Diagnosis not present

## 2017-03-02 DIAGNOSIS — L570 Actinic keratosis: Secondary | ICD-10-CM | POA: Diagnosis not present

## 2017-03-02 DIAGNOSIS — L720 Epidermal cyst: Secondary | ICD-10-CM | POA: Diagnosis not present

## 2017-03-02 DIAGNOSIS — D692 Other nonthrombocytopenic purpura: Secondary | ICD-10-CM | POA: Diagnosis not present

## 2017-03-02 DIAGNOSIS — D1800 Hemangioma unspecified site: Secondary | ICD-10-CM | POA: Diagnosis not present

## 2017-04-09 DIAGNOSIS — K5720 Diverticulitis of large intestine with perforation and abscess without bleeding: Secondary | ICD-10-CM | POA: Diagnosis not present

## 2017-04-09 DIAGNOSIS — D696 Thrombocytopenia, unspecified: Secondary | ICD-10-CM | POA: Diagnosis not present

## 2017-04-09 DIAGNOSIS — R262 Difficulty in walking, not elsewhere classified: Secondary | ICD-10-CM | POA: Diagnosis not present

## 2017-04-09 DIAGNOSIS — Z0000 Encounter for general adult medical examination without abnormal findings: Secondary | ICD-10-CM | POA: Diagnosis not present

## 2017-04-09 DIAGNOSIS — R739 Hyperglycemia, unspecified: Secondary | ICD-10-CM | POA: Diagnosis not present

## 2017-04-09 DIAGNOSIS — I10 Essential (primary) hypertension: Secondary | ICD-10-CM | POA: Diagnosis not present

## 2017-04-09 DIAGNOSIS — E7800 Pure hypercholesterolemia, unspecified: Secondary | ICD-10-CM | POA: Diagnosis not present

## 2017-04-16 DIAGNOSIS — M5441 Lumbago with sciatica, right side: Secondary | ICD-10-CM | POA: Diagnosis not present

## 2017-04-16 DIAGNOSIS — K5720 Diverticulitis of large intestine with perforation and abscess without bleeding: Secondary | ICD-10-CM | POA: Diagnosis not present

## 2017-04-16 DIAGNOSIS — R29898 Other symptoms and signs involving the musculoskeletal system: Secondary | ICD-10-CM | POA: Diagnosis not present

## 2017-04-16 DIAGNOSIS — R251 Tremor, unspecified: Secondary | ICD-10-CM | POA: Diagnosis not present

## 2017-04-16 DIAGNOSIS — G8929 Other chronic pain: Secondary | ICD-10-CM | POA: Diagnosis not present

## 2017-04-16 DIAGNOSIS — R262 Difficulty in walking, not elsewhere classified: Secondary | ICD-10-CM | POA: Diagnosis not present

## 2017-04-16 DIAGNOSIS — I10 Essential (primary) hypertension: Secondary | ICD-10-CM | POA: Diagnosis not present

## 2017-04-16 DIAGNOSIS — R739 Hyperglycemia, unspecified: Secondary | ICD-10-CM | POA: Diagnosis not present

## 2017-04-16 DIAGNOSIS — M5442 Lumbago with sciatica, left side: Secondary | ICD-10-CM | POA: Diagnosis not present

## 2017-04-27 DIAGNOSIS — R262 Difficulty in walking, not elsewhere classified: Secondary | ICD-10-CM | POA: Diagnosis not present

## 2017-04-27 DIAGNOSIS — Z96651 Presence of right artificial knee joint: Secondary | ICD-10-CM | POA: Diagnosis not present

## 2017-04-27 DIAGNOSIS — R29898 Other symptoms and signs involving the musculoskeletal system: Secondary | ICD-10-CM | POA: Diagnosis not present

## 2017-04-27 DIAGNOSIS — E119 Type 2 diabetes mellitus without complications: Secondary | ICD-10-CM | POA: Diagnosis not present

## 2017-04-27 DIAGNOSIS — J449 Chronic obstructive pulmonary disease, unspecified: Secondary | ICD-10-CM | POA: Diagnosis not present

## 2017-04-27 DIAGNOSIS — H9193 Unspecified hearing loss, bilateral: Secondary | ICD-10-CM | POA: Diagnosis not present

## 2017-04-27 DIAGNOSIS — N401 Enlarged prostate with lower urinary tract symptoms: Secondary | ICD-10-CM | POA: Diagnosis not present

## 2017-04-27 DIAGNOSIS — R351 Nocturia: Secondary | ICD-10-CM | POA: Diagnosis not present

## 2017-04-27 DIAGNOSIS — M5442 Lumbago with sciatica, left side: Secondary | ICD-10-CM | POA: Diagnosis not present

## 2017-04-27 DIAGNOSIS — M5441 Lumbago with sciatica, right side: Secondary | ICD-10-CM | POA: Diagnosis not present

## 2017-04-27 DIAGNOSIS — G8929 Other chronic pain: Secondary | ICD-10-CM | POA: Diagnosis not present

## 2017-04-27 DIAGNOSIS — I10 Essential (primary) hypertension: Secondary | ICD-10-CM | POA: Diagnosis not present

## 2017-04-29 DIAGNOSIS — R29898 Other symptoms and signs involving the musculoskeletal system: Secondary | ICD-10-CM | POA: Diagnosis not present

## 2017-04-29 DIAGNOSIS — N401 Enlarged prostate with lower urinary tract symptoms: Secondary | ICD-10-CM | POA: Diagnosis not present

## 2017-04-29 DIAGNOSIS — J449 Chronic obstructive pulmonary disease, unspecified: Secondary | ICD-10-CM | POA: Diagnosis not present

## 2017-04-29 DIAGNOSIS — R351 Nocturia: Secondary | ICD-10-CM | POA: Diagnosis not present

## 2017-04-29 DIAGNOSIS — I10 Essential (primary) hypertension: Secondary | ICD-10-CM | POA: Diagnosis not present

## 2017-04-29 DIAGNOSIS — R262 Difficulty in walking, not elsewhere classified: Secondary | ICD-10-CM | POA: Diagnosis not present

## 2017-04-29 DIAGNOSIS — M5441 Lumbago with sciatica, right side: Secondary | ICD-10-CM | POA: Diagnosis not present

## 2017-04-29 DIAGNOSIS — G8929 Other chronic pain: Secondary | ICD-10-CM | POA: Diagnosis not present

## 2017-04-29 DIAGNOSIS — Z96651 Presence of right artificial knee joint: Secondary | ICD-10-CM | POA: Diagnosis not present

## 2017-04-29 DIAGNOSIS — M5442 Lumbago with sciatica, left side: Secondary | ICD-10-CM | POA: Diagnosis not present

## 2017-04-29 DIAGNOSIS — E119 Type 2 diabetes mellitus without complications: Secondary | ICD-10-CM | POA: Diagnosis not present

## 2017-04-29 DIAGNOSIS — H9193 Unspecified hearing loss, bilateral: Secondary | ICD-10-CM | POA: Diagnosis not present

## 2017-05-12 DIAGNOSIS — E119 Type 2 diabetes mellitus without complications: Secondary | ICD-10-CM | POA: Diagnosis not present

## 2017-05-12 DIAGNOSIS — Z96651 Presence of right artificial knee joint: Secondary | ICD-10-CM | POA: Diagnosis not present

## 2017-05-12 DIAGNOSIS — M5442 Lumbago with sciatica, left side: Secondary | ICD-10-CM | POA: Diagnosis not present

## 2017-05-12 DIAGNOSIS — I10 Essential (primary) hypertension: Secondary | ICD-10-CM | POA: Diagnosis not present

## 2017-05-12 DIAGNOSIS — R262 Difficulty in walking, not elsewhere classified: Secondary | ICD-10-CM | POA: Diagnosis not present

## 2017-05-12 DIAGNOSIS — N401 Enlarged prostate with lower urinary tract symptoms: Secondary | ICD-10-CM | POA: Diagnosis not present

## 2017-05-12 DIAGNOSIS — R351 Nocturia: Secondary | ICD-10-CM | POA: Diagnosis not present

## 2017-05-12 DIAGNOSIS — J449 Chronic obstructive pulmonary disease, unspecified: Secondary | ICD-10-CM | POA: Diagnosis not present

## 2017-05-12 DIAGNOSIS — M5441 Lumbago with sciatica, right side: Secondary | ICD-10-CM | POA: Diagnosis not present

## 2017-05-12 DIAGNOSIS — H9193 Unspecified hearing loss, bilateral: Secondary | ICD-10-CM | POA: Diagnosis not present

## 2017-05-12 DIAGNOSIS — G8929 Other chronic pain: Secondary | ICD-10-CM | POA: Diagnosis not present

## 2017-05-12 DIAGNOSIS — R29898 Other symptoms and signs involving the musculoskeletal system: Secondary | ICD-10-CM | POA: Diagnosis not present

## 2017-05-20 DIAGNOSIS — J449 Chronic obstructive pulmonary disease, unspecified: Secondary | ICD-10-CM | POA: Diagnosis not present

## 2017-05-20 DIAGNOSIS — M5441 Lumbago with sciatica, right side: Secondary | ICD-10-CM | POA: Diagnosis not present

## 2017-05-20 DIAGNOSIS — R29898 Other symptoms and signs involving the musculoskeletal system: Secondary | ICD-10-CM | POA: Diagnosis not present

## 2017-05-20 DIAGNOSIS — R262 Difficulty in walking, not elsewhere classified: Secondary | ICD-10-CM | POA: Diagnosis not present

## 2017-05-20 DIAGNOSIS — N401 Enlarged prostate with lower urinary tract symptoms: Secondary | ICD-10-CM | POA: Diagnosis not present

## 2017-05-20 DIAGNOSIS — H9193 Unspecified hearing loss, bilateral: Secondary | ICD-10-CM | POA: Diagnosis not present

## 2017-05-20 DIAGNOSIS — I10 Essential (primary) hypertension: Secondary | ICD-10-CM | POA: Diagnosis not present

## 2017-05-20 DIAGNOSIS — E119 Type 2 diabetes mellitus without complications: Secondary | ICD-10-CM | POA: Diagnosis not present

## 2017-05-20 DIAGNOSIS — G8929 Other chronic pain: Secondary | ICD-10-CM | POA: Diagnosis not present

## 2017-05-20 DIAGNOSIS — M5442 Lumbago with sciatica, left side: Secondary | ICD-10-CM | POA: Diagnosis not present

## 2017-05-20 DIAGNOSIS — Z96651 Presence of right artificial knee joint: Secondary | ICD-10-CM | POA: Diagnosis not present

## 2017-05-20 DIAGNOSIS — R351 Nocturia: Secondary | ICD-10-CM | POA: Diagnosis not present

## 2017-07-01 DIAGNOSIS — J449 Chronic obstructive pulmonary disease, unspecified: Secondary | ICD-10-CM | POA: Diagnosis not present

## 2017-07-01 DIAGNOSIS — E119 Type 2 diabetes mellitus without complications: Secondary | ICD-10-CM | POA: Diagnosis not present

## 2017-07-01 DIAGNOSIS — I10 Essential (primary) hypertension: Secondary | ICD-10-CM | POA: Diagnosis not present

## 2017-07-01 DIAGNOSIS — M5441 Lumbago with sciatica, right side: Secondary | ICD-10-CM | POA: Diagnosis not present

## 2017-08-13 DIAGNOSIS — R29898 Other symptoms and signs involving the musculoskeletal system: Secondary | ICD-10-CM | POA: Diagnosis not present

## 2017-08-13 DIAGNOSIS — I10 Essential (primary) hypertension: Secondary | ICD-10-CM | POA: Diagnosis not present

## 2017-08-13 DIAGNOSIS — G8929 Other chronic pain: Secondary | ICD-10-CM | POA: Diagnosis not present

## 2017-08-13 DIAGNOSIS — K5720 Diverticulitis of large intestine with perforation and abscess without bleeding: Secondary | ICD-10-CM | POA: Diagnosis not present

## 2017-08-13 DIAGNOSIS — R739 Hyperglycemia, unspecified: Secondary | ICD-10-CM | POA: Diagnosis not present

## 2017-08-13 DIAGNOSIS — R262 Difficulty in walking, not elsewhere classified: Secondary | ICD-10-CM | POA: Diagnosis not present

## 2017-08-13 DIAGNOSIS — M5441 Lumbago with sciatica, right side: Secondary | ICD-10-CM | POA: Diagnosis not present

## 2017-08-13 DIAGNOSIS — M5442 Lumbago with sciatica, left side: Secondary | ICD-10-CM | POA: Diagnosis not present

## 2017-08-13 DIAGNOSIS — R251 Tremor, unspecified: Secondary | ICD-10-CM | POA: Diagnosis not present

## 2017-08-20 DIAGNOSIS — E079 Disorder of thyroid, unspecified: Secondary | ICD-10-CM | POA: Diagnosis not present

## 2017-08-20 DIAGNOSIS — L03116 Cellulitis of left lower limb: Secondary | ICD-10-CM | POA: Diagnosis not present

## 2017-08-20 DIAGNOSIS — R739 Hyperglycemia, unspecified: Secondary | ICD-10-CM | POA: Diagnosis not present

## 2017-08-20 DIAGNOSIS — I10 Essential (primary) hypertension: Secondary | ICD-10-CM | POA: Diagnosis not present

## 2017-09-23 DIAGNOSIS — E079 Disorder of thyroid, unspecified: Secondary | ICD-10-CM | POA: Diagnosis not present

## 2017-11-11 DIAGNOSIS — E039 Hypothyroidism, unspecified: Secondary | ICD-10-CM | POA: Diagnosis not present

## 2017-12-21 ENCOUNTER — Emergency Department: Payer: PPO

## 2017-12-21 ENCOUNTER — Encounter: Payer: Self-pay | Admitting: Emergency Medicine

## 2017-12-21 ENCOUNTER — Observation Stay
Admission: EM | Admit: 2017-12-21 | Discharge: 2017-12-22 | Disposition: A | Discharge status: Home Health | Payer: PPO | Attending: Internal Medicine | Admitting: Internal Medicine

## 2017-12-21 ENCOUNTER — Other Ambulatory Visit: Payer: Self-pay

## 2017-12-21 DIAGNOSIS — J439 Emphysema, unspecified: Secondary | ICD-10-CM | POA: Insufficient documentation

## 2017-12-21 DIAGNOSIS — R4182 Altered mental status, unspecified: Secondary | ICD-10-CM | POA: Diagnosis not present

## 2017-12-21 DIAGNOSIS — Z8249 Family history of ischemic heart disease and other diseases of the circulatory system: Secondary | ICD-10-CM | POA: Insufficient documentation

## 2017-12-21 DIAGNOSIS — N400 Enlarged prostate without lower urinary tract symptoms: Secondary | ICD-10-CM | POA: Insufficient documentation

## 2017-12-21 DIAGNOSIS — Z79899 Other long term (current) drug therapy: Secondary | ICD-10-CM | POA: Insufficient documentation

## 2017-12-21 DIAGNOSIS — R296 Repeated falls: Secondary | ICD-10-CM | POA: Insufficient documentation

## 2017-12-21 DIAGNOSIS — Z87891 Personal history of nicotine dependence: Secondary | ICD-10-CM | POA: Insufficient documentation

## 2017-12-21 DIAGNOSIS — L8990 Pressure ulcer of unspecified site, unspecified stage: Secondary | ICD-10-CM

## 2017-12-21 DIAGNOSIS — Z8673 Personal history of transient ischemic attack (TIA), and cerebral infarction without residual deficits: Secondary | ICD-10-CM | POA: Diagnosis not present

## 2017-12-21 DIAGNOSIS — M48061 Spinal stenosis, lumbar region without neurogenic claudication: Secondary | ICD-10-CM | POA: Diagnosis not present

## 2017-12-21 DIAGNOSIS — S32010A Wedge compression fracture of first lumbar vertebra, initial encounter for closed fracture: Principal | ICD-10-CM | POA: Insufficient documentation

## 2017-12-21 DIAGNOSIS — M1990 Unspecified osteoarthritis, unspecified site: Secondary | ICD-10-CM | POA: Diagnosis not present

## 2017-12-21 DIAGNOSIS — S3992XA Unspecified injury of lower back, initial encounter: Secondary | ICD-10-CM | POA: Diagnosis not present

## 2017-12-21 DIAGNOSIS — Z880 Allergy status to penicillin: Secondary | ICD-10-CM | POA: Diagnosis not present

## 2017-12-21 DIAGNOSIS — Z96651 Presence of right artificial knee joint: Secondary | ICD-10-CM | POA: Insufficient documentation

## 2017-12-21 DIAGNOSIS — M8580 Other specified disorders of bone density and structure, unspecified site: Secondary | ICD-10-CM | POA: Diagnosis not present

## 2017-12-21 DIAGNOSIS — M549 Dorsalgia, unspecified: Secondary | ICD-10-CM | POA: Diagnosis present

## 2017-12-21 DIAGNOSIS — G2581 Restless legs syndrome: Secondary | ICD-10-CM | POA: Diagnosis not present

## 2017-12-21 DIAGNOSIS — Z888 Allergy status to other drugs, medicaments and biological substances status: Secondary | ICD-10-CM | POA: Diagnosis not present

## 2017-12-21 DIAGNOSIS — K5790 Diverticulosis of intestine, part unspecified, without perforation or abscess without bleeding: Secondary | ICD-10-CM | POA: Insufficient documentation

## 2017-12-21 DIAGNOSIS — M545 Low back pain: Secondary | ICD-10-CM | POA: Diagnosis not present

## 2017-12-21 DIAGNOSIS — R4181 Age-related cognitive decline: Secondary | ICD-10-CM | POA: Diagnosis not present

## 2017-12-21 DIAGNOSIS — R410 Disorientation, unspecified: Secondary | ICD-10-CM | POA: Diagnosis not present

## 2017-12-21 DIAGNOSIS — Z8719 Personal history of other diseases of the digestive system: Secondary | ICD-10-CM | POA: Diagnosis not present

## 2017-12-21 DIAGNOSIS — E039 Hypothyroidism, unspecified: Secondary | ICD-10-CM | POA: Diagnosis not present

## 2017-12-21 DIAGNOSIS — I700 Atherosclerosis of aorta: Secondary | ICD-10-CM | POA: Diagnosis not present

## 2017-12-21 DIAGNOSIS — Z8551 Personal history of malignant neoplasm of bladder: Secondary | ICD-10-CM | POA: Insufficient documentation

## 2017-12-21 DIAGNOSIS — I714 Abdominal aortic aneurysm, without rupture: Secondary | ICD-10-CM | POA: Diagnosis not present

## 2017-12-21 DIAGNOSIS — E785 Hyperlipidemia, unspecified: Secondary | ICD-10-CM | POA: Diagnosis not present

## 2017-12-21 DIAGNOSIS — M419 Scoliosis, unspecified: Secondary | ICD-10-CM | POA: Insufficient documentation

## 2017-12-21 DIAGNOSIS — I739 Peripheral vascular disease, unspecified: Secondary | ICD-10-CM | POA: Insufficient documentation

## 2017-12-21 DIAGNOSIS — W19XXXA Unspecified fall, initial encounter: Secondary | ICD-10-CM | POA: Diagnosis not present

## 2017-12-21 LAB — COMPREHENSIVE METABOLIC PANEL
ALBUMIN: 3.9 g/dL (ref 3.5–5.0)
ALK PHOS: 125 U/L (ref 38–126)
ALT: 34 U/L (ref 17–63)
ANION GAP: 8 (ref 5–15)
AST: 39 U/L (ref 15–41)
BUN: 15 mg/dL (ref 6–20)
CALCIUM: 8.5 mg/dL — AB (ref 8.9–10.3)
CHLORIDE: 100 mmol/L — AB (ref 101–111)
CO2: 26 mmol/L (ref 22–32)
Creatinine, Ser: 0.89 mg/dL (ref 0.61–1.24)
GFR calc non Af Amer: >60 mL/min (ref >60)
GLUCOSE: 153 mg/dL — AB (ref 65–99)
Potassium: 4.1 mmol/L (ref 3.5–5.1)
SODIUM: 134 mmol/L — AB (ref 135–145)
Total Bilirubin: 1.1 mg/dL (ref 0.3–1.2)
Total Protein: 8 g/dL (ref 6.5–8.1)

## 2017-12-21 LAB — URINALYSIS, COMPLETE (UACMP) WITH MICROSCOPIC
BACTERIA UA: NONE SEEN
Bilirubin Urine: NEGATIVE
Glucose, UA: NEGATIVE mg/dL
HGB URINE DIPSTICK: NEGATIVE
Ketones, ur: 5 mg/dL — AB
Leukocytes, UA: NEGATIVE
Nitrite: NEGATIVE
PROTEIN: 30 mg/dL — AB
SPECIFIC GRAVITY, URINE: 1.013 (ref 1.005–1.030)
SQUAMOUS EPITHELIAL / LPF: NONE SEEN (ref 0–5)
pH: 7 (ref 5.0–8.0)

## 2017-12-21 LAB — CBC
HEMATOCRIT: 47 % (ref 40.0–52.0)
HEMOGLOBIN: 15.7 g/dL (ref 13.0–18.0)
MCH: 29.7 pg (ref 26.0–34.0)
MCHC: 33.5 g/dL (ref 32.0–36.0)
MCV: 88.5 fL (ref 80.0–100.0)
Platelets: 154 10*3/uL (ref 150–440)
RBC: 5.31 MIL/uL (ref 4.40–5.90)
RDW: 14.7 % — AB (ref 11.5–14.5)
WBC: 7.5 10*3/uL (ref 3.8–10.6)

## 2017-12-21 LAB — TROPONIN I: Troponin I: <0.03 ng/mL (ref <0.03)

## 2017-12-21 MED ORDER — GABAPENTIN 100 MG PO CAPS
100.0000 mg | ORAL_CAPSULE | Freq: Three times a day (TID) | ORAL | Status: DC
  Administered 2017-12-21 (×2): 100 mg via ORAL
  Filled 2017-12-21 (×3): qty 1

## 2017-12-21 MED ORDER — LOSARTAN POTASSIUM 50 MG PO TABS
50.0000 mg | ORAL_TABLET | Freq: Every day | ORAL | Status: DC
  Administered 2017-12-22: 50 mg via ORAL
  Filled 2017-12-21: qty 1

## 2017-12-21 MED ORDER — ENOXAPARIN SODIUM 40 MG/0.4ML ~~LOC~~ SOLN
40.0000 mg | SUBCUTANEOUS | Status: DC
  Administered 2017-12-21: 40 mg via SUBCUTANEOUS
  Filled 2017-12-21: qty 0.4

## 2017-12-21 MED ORDER — ONDANSETRON HCL 4 MG/2ML IJ SOLN: 4.0000 mg | Freq: Four times a day (QID) | INTRAMUSCULAR | Status: DC | PRN

## 2017-12-21 MED ORDER — CARVEDILOL 3.125 MG PO TABS
3.1250 mg | ORAL_TABLET | Freq: Two times a day (BID) | ORAL | Status: DC
  Administered 2017-12-21 – 2017-12-22 (×2): 3.125 mg via ORAL
  Filled 2017-12-21 (×2): qty 1

## 2017-12-21 MED ORDER — LEVOTHYROXINE SODIUM 112 MCG PO TABS
112.0000 ug | ORAL_TABLET | Freq: Every day | ORAL | Status: DC
  Administered 2017-12-22: 112 ug via ORAL
  Filled 2017-12-21: qty 1

## 2017-12-21 MED ORDER — ONDANSETRON HCL 4 MG PO TABS: 4.0000 mg | ORAL_TABLET | Freq: Four times a day (QID) | ORAL | Status: DC | PRN

## 2017-12-21 MED ORDER — ADULT MULTIVITAMIN W/MINERALS CH
1.0000 | ORAL_TABLET | Freq: Every day | ORAL | Status: DC
  Administered 2017-12-22: 1 via ORAL
  Filled 2017-12-21: qty 1

## 2017-12-21 MED ORDER — ACETAMINOPHEN 325 MG PO TABS
650.0000 mg | ORAL_TABLET | Freq: Four times a day (QID) | ORAL | Status: DC | PRN
  Administered 2017-12-21: 650 mg via ORAL
  Filled 2017-12-21: qty 2

## 2017-12-21 MED ORDER — ROPINIROLE HCL 0.25 MG PO TABS
0.5000 mg | ORAL_TABLET | Freq: Every day | ORAL | Status: DC
  Administered 2017-12-21: 0.5 mg via ORAL
  Filled 2017-12-21 (×2): qty 2

## 2017-12-21 MED ORDER — ACETAMINOPHEN 650 MG RE SUPP: 650.0000 mg | Freq: Four times a day (QID) | RECTAL | Status: DC | PRN

## 2017-12-21 MED ORDER — TAMSULOSIN HCL 0.4 MG PO CAPS
0.8000 mg | ORAL_CAPSULE | Freq: Every evening | ORAL | Status: DC
  Administered 2017-12-21: 0.8 mg via ORAL
  Filled 2017-12-21: qty 2

## 2017-12-21 MED ORDER — TRAMADOL HCL 50 MG PO TABS: 50.0000 mg | ORAL_TABLET | Freq: Four times a day (QID) | ORAL | Status: DC | PRN

## 2017-12-21 NOTE — NC FL2 (Signed)
[place] LEVEL OF CARE SCREENING TOOL     IDENTIFICATION  Patient Name: David Morgan Birthdate: 1924/11/11 Sex: male Admission Date (Current Location): 12/21/2017  [place] and [place] Number:  Engineering geologist and Address:  [facility], [address] [place], [place] [address]      Provider Number: [id number]  Attending Physician Name and Address:  Henreitta Leber, MD  Relative Name and Phone Number:       Current Level of Care: Hospital Recommended Level of Care: [place] Prior Approval Number:    Date Approved/Denied:   PASRR Number: [id number])  Discharge Plan: SNF    Current Diagnoses: Patient Active Problem List   Diagnosis Date Noted  . Back pain 12/21/2017  . History of diverticulitis 05/02/2016  . Difficulty walking 02/04/2016  . Tremor 02/04/2016  . Weakness 02/04/2016  . Disease of thyroid gland 07/10/2015  . BP (high blood pressure) 07/10/2015  . HLD (hyperlipidemia) 07/10/2015  . Benign fibroma of prostate 08/02/2014    Orientation RESPIRATION BLADDER Height & Weight     Self, Place, Situation  Normal Continent Weight: 230 lb (104.3 kg) Height:  5\' 6"  (167.6 cm)  BEHAVIORAL SYMPTOMS/MOOD NEUROLOGICAL BOWEL NUTRITION STATUS      Continent Diet(Diet: Heart Healthy )  AMBULATORY STATUS COMMUNICATION OF NEEDS Skin   Extensive Assist Verbally Normal                       Personal Care Assistance Level of Assistance  Bathing, Feeding, Dressing Bathing Assistance: Limited assistance Feeding assistance: Independent Dressing Assistance: Limited assistance     Functional Limitations Info  Sight, Hearing, Speech Sight Info: Adequate Hearing Info: Adequate Speech Info: Adequate    SPECIAL CARE FACTORS FREQUENCY  PT (By licensed PT), OT (By licensed OT)     PT Frequency: (5) OT Frequency: (5)            Contractures      Additional Factors Info  Code Status,  Allergies Code Status Info: (Full Code. ) Allergies Info: (Adhesive Tape, Augmentin Amoxicillin-pot Clavulanate)           Current Medications (12/21/2017):  This is the current hospital active medication list Current Facility-Administered Medications  Medication Dose Route Frequency Provider Last Rate Last Dose  . acetaminophen (TYLENOL) tablet 650 mg  650 mg Oral Q6H PRN Henreitta Leber, MD       Or  . acetaminophen (TYLENOL) suppository 650 mg  650 mg Rectal Q6H PRN Henreitta Leber, MD      . carvedilol (COREG) tablet 3.125 mg  3.125 mg Oral BID WC Sainani, Belia Heman, MD      . enoxaparin (LOVENOX) injection 40 mg  40 mg Subcutaneous Q24H Sainani, Vivek J, MD      . gabapentin (NEURONTIN) capsule 100 mg  100 mg Oral TID Henreitta Leber, MD      . Derrill Memo ON 12/22/2017] levothyroxine (SYNTHROID, LEVOTHROID) tablet 112 mcg  112 mcg Oral QAC breakfast Sainani, Belia Heman, MD      . losartan (COZAAR) tablet 50 mg  50 mg Oral Daily Sainani, Belia Heman, MD      . multivitamin with minerals tablet 1 tablet  1 tablet Oral Daily Sainani, Belia Heman, MD      . ondansetron (ZOFRAN) tablet 4 mg  4 mg Oral Q6H PRN Henreitta Leber, MD       Or  . ondansetron [facility]) injection  4 mg  4 mg Intravenous Q6H PRN Sainani, Belia Heman, MD      . rOPINIRole (REQUIP) tablet 0.5 mg  0.5 mg Oral QHS Sainani, Belia Heman, MD      . tamsulosin (FLOMAX) capsule 0.8 mg  0.8 mg Oral QPM Sainani, Belia Heman, MD      . traMADol (ULTRAM) tablet 50 mg  50 mg Oral Q6H PRN Henreitta Leber, MD         Discharge Medications: Please see discharge summary for a list of discharge medications.  Relevant Imaging Results:  Relevant Lab Results:   Additional Information (SSN: [id number])  Damacio Weisgerber, Veronia Beets, LCSW

## 2017-12-21 NOTE — ED Provider Notes (Signed)
[facility] Emergency Department Provider Note  Time seen: 8:21 AM  I have reviewed the triage vital signs and the nursing notes.   HISTORY  Chief Complaint Back Pain and Fall    HPI David Morgan is a 84 y.o. male with a past medical history of arthritis, hypertension, hyperlipidemia, presents to the emergency department after a fall.  According to the wife yesterday the patient was complaining of seeing things around the house such as water leaking thing running across the carpet.  Wife states the patient has never hallucinated previously.  States around 5:00 this morning he got up to take his medications and fell backwards onto the ground.  They called EMS who helped the patient back into his chair, and at that time refused transport however the patient's back began hurting worse so he came to the emergency department via EMS 2 hours later.  Patient has a history of chronic back pain has had a prior kyphoplasty but states acutely worse currently especially with movement of the left lower extremity.  Patient has been ambulatory since the fall with discomfort but able to walk per wife.  Denies any chest pain or abdominal pain.  No urinary complaints.  Largely negative review of systems otherwise.   Past Medical History:  Diagnosis Date  . Arthritis   . Cancer ([place])   . Claudication ([place])   . Diverticulitis   . Emphysema/COPD ([place])   . History of bladder cancer   . History of carotid artery stenosis   . History of TIA (transient ischemic attack)    2012--  NO RESIDUAL (PER SCAN HAD A PREVIOUS TIA BEFORE 2012)  . Hyperlipidemia   . Hypertension   . Hypothyroidism   . Lesion of bladder   . Mild asthma    NO INHALER  . Nocturia   . Peripheral vascular disease ([place])   . Short of breath on exertion   . Urgency of urination   . Wears glasses     Patient Active Problem List   Diagnosis Date Noted  . History of diverticulitis 05/02/2016  . Difficulty  walking 02/04/2016  . Tremor 02/04/2016  . Weakness 02/04/2016  . Disease of thyroid gland 07/10/2015  . BP (high blood pressure) 07/10/2015  . HLD (hyperlipidemia) 07/10/2015  . Benign fibroma of prostate 08/02/2014    Past Surgical History:  Procedure Laterality Date  . CAROTID ENDARTERECTOMY Right 2005  . CARPAL TUNNEL RELEASE Bilateral 2002  &  2007  . CATARACT EXTRACTION W/ INTRAOCULAR LENS  IMPLANT, BILATERAL    . CYSTOSCOPY W/ RETROGRADES Bilateral 12/26/2013   Procedure: BILATERAL RETROGRADE PYELOGRAM;  Surgeon: Claybon Jabs, MD;  Location[facility];  Service: Urology;  Laterality: Bilateral;  . CYSTOSCOPY WITH BIOPSY N/A 12/26/2013   Procedure: CYSTOSCOPY WITH BLADDER BIOPSY;  Surgeon: Claybon Jabs, MD;  Location: [facility];  Service: Urology;  Laterality: N/A;  . INGUINAL HERNIA REPAIR  YRS AGO  . LAPAROSCOPIC LYSIS OF ADHESIONS  06/28/2015   Procedure: LAPAROSCOPIC LYSIS OF ADHESIONS;  Surgeon: Clayburn Pert, MD;  Location: [facility] OR[facility];  Service: General;;  . LAPAROSCOPY  06/28/2015   Procedure: LAPAROSCOPY DIAGNOSTIC;  Surgeon: Clayburn Pert, MD;  Location: [facility] OR[facility];  Service: General;;  . LAPAROTOMY N/A 09/03/2015   Procedure: umbilical hernia repair with mesh;  Surgeon: Hubbard Robinson, MD;  Location: [facility] OR[facility];  Service: General;  Laterality: N/A;  . ORIF HIP FRACTURE Left 2008   RETAINED HARDWARE  .  RIGHT SHOULDER  SURGERY  2005  . TOTAL KNEE ARTHROPLASTY Right 2004  . TRANSURETHRAL RESECTION OF BLADDER TUMOR  1990  . UMBILICAL HERNIA REPAIR   2009  &  2011    Prior to Admission medications   Medication Sig Start Date End Date Taking? Authorizing Provider  acetaminophen (TYLENOL) 500 MG tablet Take 2 tablets (1,000 mg total) by mouth every 6 (six) hours as needed. 09/21/15   Hubbard Robinson, MD  amLODipine (NORVASC) 2.5 MG tablet Take 2.5 mg by mouth every morning. Reported on 10/03/2015    [provider]   atorvastatin (LIPITOR) 40 MG tablet Take 40 mg by mouth every evening.    [provider]  atorvastatin (LIPITOR) 40 MG tablet Take 1 tablet by mouth 1 day or 1 dose. 08/02/15   [provider]  carbidopa-levodopa (SINEMET IR) 25-100 MG tablet Take 1 tablet by mouth 3 (three) times daily. 12/04/15 12/03/16  [provider]  carvedilol (COREG) 3.125 MG tablet Take 3.125 mg by mouth 2 (two) times daily with a meal.    [provider]  carvedilol (COREG) 3.125 MG tablet Take 1 tablet by mouth 1 day or 1 dose. 08/02/15   [provider]  ciprofloxacin (CIPRO) 500 MG tablet Take 1 tablet (500 mg total) by mouth 2 (two) times daily. 01/09/16   Hubbard Robinson, MD  gabapentin (NEURONTIN) 100 MG capsule Take 100 mg by mouth 3 (three) times daily.     [provider]  Hydrocodone-Acetaminophen (VICODIN) 5-300 MG TABS Take 1 tablet by mouth every 4 (four) hours as needed. 09/21/15   Hubbard Robinson, MD  levothyroxine (SYNTHROID, LEVOTHROID) 137 MCG tablet Take 1 tablet by mouth 1 day or 1 dose. 12/04/15   [provider]  levothyroxine (SYNTHROID, LEVOTHROID) 137 MCG tablet Take 1 tablet by mouth 1 day or 1 dose. 12/04/15 12/03/16  [provider]  levothyroxine (SYNTHROID, LEVOTHROID) 150 MCG tablet Take 150 mcg by mouth every evening.    [provider]  losartan (COZAAR) 50 MG tablet Take 50 mg by mouth daily.  06/26/15   [provider]  losartan (COZAAR) 50 MG tablet Take 1 tablet by mouth 1 day or 1 dose. 08/02/15 08/01/16  [provider]  Multiple Vitamin (MULTI-VITAMINS) TABS Take 1 tablet by mouth daily.    [provider]  Multiple Vitamin (MULTI-VITAMINS) TABS Take 1 tablet by mouth 1 day or 1 dose.    [provider]  ondansetron (ZOFRAN) 4 MG tablet Take 1 tablet (4 mg total) by mouth every 6 (six) hours as needed for nausea. 08/10/15   Sherri Rad, MD  tamsulosin (FLOMAX) 0.4 MG  CAPS capsule Take 0.8 mg by mouth every evening.    [provider]  tamsulosin (FLOMAX) 0.4 MG CAPS capsule Take 1 capsule by mouth 1 day or 1 dose. 08/02/15   [provider]  VITAMIN B COMPLEX-C PO Take 1 tablet by mouth 1 day or 1 dose.    [provider]    Allergies  Allergen Reactions  . Adhesive [Tape] Rash  . Augmentin [Amoxicillin-Pot Clavulanate] Itching, Rash and Other (See Comments)    Has patient had a PCN reaction causing immediate rash, facial/tongue/throat swelling, SOB or lightheadedness with hypotension: No Has patient had a PCN reaction causing severe rash involving mucus membranes or skin necrosis: No Has patient had a PCN reaction that required hospitalization No Has patient had a PCN reaction occurring within the last 10  years: No If all of the above answers are "NO", then may proceed with Cephalosporin use.     Family History  Problem Relation Age of Onset  . Hypertension Father     Social History Social History   Tobacco Use  . Smoking status: Former Smoker    Packs/day: 1.00    Years: 40.00    Pack years: 40.00    Types: Cigarettes    Last attempt to quit: 12/21/1993    Years since quitting: 24.0  . Smokeless tobacco: Never Used  Substance Use Topics  . Alcohol use: No    Comment: RARE  . Drug use: No    Review of Systems Constitutional: Negative for fever. Eyes: Negative for visual complaints ENT: Negative for recent illness/congestion Cardiovascular: Negative for chest pain. Respiratory: Negative for shortness of breath. Gastrointestinal: Negative for abdominal pain, vomiting and diarrhea. Genitourinary: Negative for urinary compaints Musculoskeletal: Lower back pain Skin: Negative for skin complaints  Neurological: Negative for headache All other ROS negative  ____________________________________________   PHYSICAL EXAM:  VITAL SIGNS: ED Triage Vitals [12/21/17 0737]  Enc Vitals Group     BP (!) 193/96      Pulse Rate 82     Resp 16     Temp 98.5 F (36.9 C)     Temp Source Oral     SpO2 96 %     Weight 230 lb (104.3 kg)     Height 5\' 6"  (1.676 m)     Head Circumference      [facility] Flow      Pain Score 2     Pain Loc      Pain Edu?      Excl. in [place]?     Constitutional: Alert, well-appearing, no distress. Eyes: Normal exam ENT   Head: Normocephalic and atraumatic   Mouth/Throat: Mucous membranes are moist. Cardiovascular: Normal rate, regular rhythm. Respiratory: Normal respiratory effort without tachypnea nor retractions. Breath sounds are clear Gastrointestinal: Soft and nontender. No distention.  Musculoskeletal: Moderate lumbar tenderness, pain in his left pelvis with movement of the left lower extremity.  No hip tenderness to palpation.  Good range of motion in upper and lower extremities although pain elicited in lower back and pelvis with range of motion of left lower extremity. Neurologic:  Normal speech and language. No gross focal neurologic deficits Skin:  Skin is warm, dry and intact.  Psychiatric: Mood and affect are normal.   ____________________________________________    EKG  EKG reviewed and interpreted by myself shows normal sinus rhythm at 80 bpm with a narrow QRS, normal axis, largely normal intervals with no concerning ST changes.  ____________________________________________    RADIOLOGY  CT head negative CT lumbar/pelvis shows acute vertebral compression fracture, mild.  ____________________________________________   INITIAL IMPRESSION / ASSESSMENT AND PLAN / ED COURSE  Pertinent labs & imaging results that were available during my care of the patient were reviewed by me and considered in my medical decision making (see chart for details).  Patient presents to the emergency department after a fall with lower back pain, but also has a secondary complaint wife states the patient was seeing things yesterday which has never happened before.   States a history of falls which is not uncommon as the patient has balance issues, but is never been confused or hallucinated previously.  Currently the patient appears well, no distress, answers questions appropriately and correctly.  We will check labs, urinalysis, obtain CT imaging of the head lower back  and pelvis as well.  Differential this time could include infectious abnormality, metabolic/electrolyte abnormality, intracranial injury/tumor/mass.  Patient and wife agreeable to this plan of care.  Imaging is negative for acute intracranial abnormality.  Positive for acute vertebral compression fracture.  L5 segmentation appears to be chronic.  I discussed this with Dr. Harlow Mares of orthopedic surgery..  Labs are largely within normal limits.  Wife denies any new medications besides Claritin but the patient has taken this before without ill effects.  It is not clear what is causing the patient's confusion, continues to hallucinate, warned the nurse not to fall down the stairwell next to the bed.  Given the patient's acute confusion/delirium with fall we will admit to the hospitalist service.  ____________________________________________   FINAL CLINICAL IMPRESSION(S) / ED DIAGNOSES  Back pain Confusion    Harvest Dark, MD 12/21/17 1127

## 2017-12-21 NOTE — ED Notes (Signed)
Per wife patient has had increasing confusion. Attempting to get urine spec via urinal.

## 2017-12-21 NOTE — Clinical Social Work Note (Addendum)
Clinical Social Work Assessment  Patient Details  Name: David Morgan MRN: [id number] Date of Birth: Apr 08, 1925  Date of referral:  12/21/17               Reason for consult:  Facility Placement                Permission sought to share information with:  Chartered certified accountant granted to share information::  Yes, Verbal Permission Granted  Name::      [place]::   [place]   Relationship::     Contact Information:     Housing/Transportation Living arrangements for the past 2 months:  [address] of Information:  Adult Children Patient Interpreter Needed:  None Criminal Activity/Legal Involvement Pertinent to Current Situation/Hospitalization:  No - Comment as needed Significant Relationships:  Adult Children Lives with:  Spouse Do you feel safe going back to the place where you live?  Yes Need for family participation in patient care:  Yes (Comment)  Care giving concerns:  Patient lives in [place] with his wife [name].    Social Worker assessment / plan:  Holiday representative (CSW) received SNF consult. PT is pending. CSW met with patient's step daughter and step granddaughter outside of the room. Nurse was at the bedside doing the admission. Per step daughter her mother [name] is patient's wife. Patient and [name] live together in [place]. Per step daughter [name] is requesting that patient go to [facility] for short term rehab. CSW explained that PT will have to evaluate patient first and make a recommendation. CSW also explained that Health Team will have to approve SNF based on the PT evaluation. Per step daughter patient has been to [facility] several years ago. FL2 complete and faxed out. Health Team case manager is aware of above. CSW will continue to follow and assist as needed.   Employment status:  Retired Nurse, adult PT Recommendations:  Not assessed at this time Information / Referral  to community resources:  [place]  Patient/Family's Response to care:  Patient's family requested [facility].   Patient/Family's Understanding of and Emotional Response to Diagnosis, Current Treatment, and Prognosis:  Patient's family was very pleasant and thanked CSW for assistance.   Emotional Assessment Appearance:  Appears stated age Attitude/Demeanor/Rapport:  Unable to Assess Affect (typically observed):  Unable to Assess Orientation:  Oriented to Self, Oriented to Place, Oriented to Situation, Fluctuating Orientation (Suspected and/or reported Sundowners) Alcohol / Substance use:  Not Applicable Psych involvement (Current and /or in the community):  No (Comment)  Discharge Needs  Concerns to be addressed:  Discharge Planning Concerns Readmission within the last 30 days:  No Current discharge risk:  Dependent with Mobility Barriers to Discharge:  Continued Medical Work up   [employer], Veronia Beets, LCSW 12/21/2017, 3:09 PM

## 2017-12-21 NOTE — Clinical Social Work Placement (Signed)
   CLINICAL SOCIAL WORK PLACEMENT  NOTE  Date:  12/21/2017  Patient Details  Name: David Morgan MRN: [id number] Date of Birth: January 30, 1925  Clinical Social Work is seeking post-discharge placement for this patient at the [place] level of care (*CSW will initial, date and re-position this form in  chart as items are completed):  Yes   Patient/family provided with [place] Work Department's list of facilities offering this level of care within the geographic area requested by the patient (or if unable, by the patient's family).  Yes   Patient/family informed of their freedom to choose among providers that offer the needed level of care, that participate in Medicare, Medicaid or managed care program needed by the patient, have an available bed and are willing to accept the patient.  Yes   Patient/family informed of [facility]'s ownership interest in [facility] and [facility], as well as of the fact that they are under no obligation to receive care at these facilities.  PASRR submitted to EDS on 12/21/17     PASRR number received on 12/21/17     Existing PASRR number confirmed on       FL2 transmitted to all facilities in geographic area requested by pt/family on 12/21/17     FL2 transmitted to all facilities within larger geographic area on       Patient informed that his/her managed care company has contracts with or will negotiate with certain facilities, including the following:            Patient/family informed of bed offers received.  Patient chooses bed at       Physician recommends and patient chooses bed at      Patient to be transferred to   on  .  Patient to be transferred to facility by       Patient family notified on   of transfer.  Name of family member notified:        PHYSICIAN       Additional Comment:    _______________________________________________ Brad Lieurance, Veronia Beets, LCSW 12/21/2017, 3:09 PM

## 2017-12-21 NOTE — Progress Notes (Signed)
Clinical Education officer, museum (CSW) met with patient's wife and presented bed offers. She chose [facility]. CSW explained to wife that Health Team may deny SNF because of his dementia and ability to participate in PT. Wife verbalized her understanding.   [employer], LCSW [phone]

## 2017-12-21 NOTE — H&P (Signed)
[facility] NAME: David Morgan    MR#:  [id number]  DATE OF BIRTH:  [birth year]  DATE OF ADMISSION:  12/21/2017  PRIMARY CARE PHYSICIAN: [name], MD   REQUESTING/REFERRING PHYSICIAN: Dr. Harvest Dark  CHIEF COMPLAINT:   Chief Complaint  Patient presents with  . Back Pain  . Fall    HISTORY OF PRESENT ILLNESS:  David Morgan  is a 84 y.o. male with a known history of hypertension, hyperlipidemia, hypothyroidism, peripheral vascular disease, history of diverticulitis, COPD, who presents to the hospital due to a fall and also complaining of some back pain.  As per the wife patient has had 3-4 falls in the past 3 months.  He also has had some cognitive decline and confusion over the past few years as per the wife.  After his fall this morning EMS was called to the house but patient refused to come to the hospital.  Shortly after EMS left patient continued to complain of significant back pain and difficulty ambulating and therefore agreed to come to the hospital for further evaluation.  Patient CT of the lumbar spine did show a mild compression fracture of the L2 vertebrae.  CT head and other work-up is essentially benign.  As per the wife patient has continued to be more confused and is has been hallucinating over the past 24 hours.  Given his altered mental status, ongoing back pain and weakness and history of recurrent falls hospitalist services were consulted for admission.  PAST MEDICAL HISTORY:   Past Medical History:  Diagnosis Date  . Arthritis   . Cancer ([place])   . Claudication ([place])   . Diverticulitis   . Emphysema/COPD ([place])   . History of bladder cancer   . History of carotid artery stenosis   . History of TIA (transient ischemic attack)    2012--  NO RESIDUAL (PER SCAN HAD A PREVIOUS TIA BEFORE 2012)  . Hyperlipidemia   . Hypertension   . Hypothyroidism   . Lesion of bladder   . Mild asthma    NO  INHALER  . Nocturia   . Peripheral vascular disease ([place])   . Short of breath on exertion   . Urgency of urination   . Wears glasses     PAST SURGICAL HISTORY:   Past Surgical History:  Procedure Laterality Date  . CAROTID ENDARTERECTOMY Right 2005  . CARPAL TUNNEL RELEASE Bilateral 2002  &  2007  . CATARACT EXTRACTION W/ INTRAOCULAR LENS  IMPLANT, BILATERAL    . CYSTOSCOPY W/ RETROGRADES Bilateral 12/26/2013   Procedure: BILATERAL RETROGRADE PYELOGRAM;  Surgeon: Claybon Jabs, MD;  Location: [facility];  Service: Urology;  Laterality: Bilateral;  . CYSTOSCOPY WITH BIOPSY N/A 12/26/2013   Procedure: CYSTOSCOPY WITH BLADDER BIOPSY;  Surgeon: Claybon Jabs, MD;  Location: [facility]  Service: Urology;  Laterality: N/A;  . INGUINAL HERNIA REPAIR  YRS AGO  . LAPAROSCOPIC LYSIS OF ADHESIONS  06/28/2015   Procedure: LAPAROSCOPIC LYSIS OF ADHESIONS;  Surgeon: Clayburn Pert, MD;  Location: [facility];  Service: General;;  . LAPAROSCOPY  06/28/2015   Procedure: LAPAROSCOPY DIAGNOSTIC;  Surgeon: Clayburn Pert, MD;  Location: [facility];  Service: General;;  . LAPAROTOMY N/A 09/03/2015   Procedure: umbilical hernia repair with mesh;  Surgeon: Hubbard Robinson, MD;  Location: [facility];  Service: General;  Laterality: N/A;  . ORIF HIP FRACTURE Left 2008   RETAINED HARDWARE  . RIGHT  SHOULDER  SURGERY  2005  . TOTAL KNEE ARTHROPLASTY Right 2004  . TRANSURETHRAL RESECTION OF BLADDER TUMOR  1990  . UMBILICAL HERNIA REPAIR   2009  &  2011    SOCIAL HISTORY:   Social History   Tobacco Use  . Smoking status: Former Smoker    Packs/day: 1.00    Years: 40.00    Pack years: 40.00    Types: Cigarettes    Last attempt to quit: 12/21/1993    Years since quitting: 24.0  . Smokeless tobacco: Never Used  Substance Use Topics  . Alcohol use: No    Comment: RARE    FAMILY HISTORY:   Family History  Problem Relation Age of Onset  . Hypertension Father   .  Alzheimer's disease Mother     DRUG ALLERGIES:   Allergies  Allergen Reactions  . Adhesive [Tape] Rash  . Augmentin [Amoxicillin-Pot Clavulanate] Itching, Rash and Other (See Comments)    Has patient had a PCN reaction causing immediate rash, facial/tongue/throat swelling, SOB or lightheadedness with hypotension: No Has patient had a PCN reaction causing severe rash involving mucus membranes or skin necrosis: No Has patient had a PCN reaction that required hospitalization No Has patient had a PCN reaction occurring within the last 10 years: No If all of the above answers are "NO", then may proceed with Cephalosporin use.     REVIEW OF SYSTEMS:   Review of Systems  Constitutional: Negative for fever and weight loss.  HENT: Negative for congestion, nosebleeds and tinnitus.   Eyes: Negative for blurred vision, double vision and redness.  Respiratory: Negative for cough, hemoptysis and shortness of breath.   Cardiovascular: Negative for chest pain, orthopnea, leg swelling and PND.  Gastrointestinal: Negative for abdominal pain, diarrhea, melena, nausea and vomiting.  Genitourinary: Negative for dysuria, hematuria and urgency.  Musculoskeletal: Positive for back pain and falls. Negative for joint pain.  Neurological: Positive for weakness (Generalized). Negative for dizziness, tingling, sensory change, focal weakness, seizures and headaches.  Endo/Heme/Allergies: Negative for polydipsia. Does not bruise/bleed easily.  Psychiatric/Behavioral: Negative for depression and memory loss. The patient is not nervous/anxious.     MEDICATIONS AT HOME:   Prior to Admission medications   Medication Sig Start Date End Date Taking? Authorizing Provider  acetaminophen (TYLENOL) 500 MG tablet Take 2 tablets (1,000 mg total) by mouth every 6 (six) hours as needed. 09/21/15  Yes Loflin, Lavona [place]ound, MD  carvedilol (COREG) 3.125 MG tablet Take 3.125 mg by mouth 2 (two) times daily with a meal.   Yes  [provider]  gabapentin (NEURONTIN) 300 MG capsule Take 300 mg by mouth 3 (three) times daily.    Yes [provider]  levothyroxine (SYNTHROID, LEVOTHROID) 112 MCG tablet Take 112 mcg by mouth daily before breakfast.   Yes [provider]  losartan (COZAAR) 50 MG tablet Take 50 mg by mouth daily.  06/26/15  Yes [provider]  Multiple Vitamin (MULTI-VITAMINS) TABS Take 1 tablet by mouth daily.   Yes [provider]  rOPINIRole (REQUIP) 0.5 MG tablet Take 0.5 mg by mouth at bedtime.   Yes [provider]  tamsulosin (FLOMAX) 0.4 MG CAPS capsule Take 0.8 mg by mouth every evening.   Yes [provider]  Hydrocodone-Acetaminophen (VICODIN) 5-300 MG TABS Take 1 tablet by mouth every 4 (four) hours as needed. Patient not taking: Reported on 12/21/2017 09/21/15   Hubbard Robinson, MD  ondansetron (ZOFRAN) 4 MG tablet Take 1 tablet (4  mg total) by mouth every 6 (six) hours as needed for nausea. Patient not taking: Reported on 12/21/2017 08/10/15   Sherri Rad, MD      VITAL SIGNS:  Blood pressure (!) 159/91, pulse 75, temperature 98.5 F (36.9 C), temperature source Oral, resp. rate 18, height 5\' 6"  (1.676 m), weight 104.3 kg (230 lb), SpO2 94 %.  PHYSICAL EXAMINATION:  Physical Exam  GENERAL:  84 y.o.-year-old patient lying in the bed in no acute distress.  EYES: Pupils equal, round, reactive to light and accommodation. No scleral icterus. Extraocular muscles intact.  HEENT: Head atraumatic, normocephalic. Oropharynx and nasopharynx clear. No oropharyngeal erythema, moist oral mucosa  NECK:  Supple, no jugular venous distention. No thyroid enlargement, no tenderness.  LUNGS: Normal breath sounds bilaterally, no wheezing, rales, rhonchi. No use of accessory muscles of respiration.  CARDIOVASCULAR: S1, S2 RRR. No murmurs, rubs, gallops, clicks.  ABDOMEN: Soft, nontender, nondistended. Bowel sounds present. No organomegaly or mass.   EXTREMITIES: No pedal edema, cyanosis, or clubbing. + 2 pedal & radial pulses b/l.   NEUROLOGIC: Cranial nerves II through XII are intact. No focal Motor or sensory deficits appreciated b/l. Globally weak.  PSYCHIATRIC: The patient is alert and oriented x 2. SKIN: No obvious rash, lesion, or ulcer.  Signs of peripheral vascular disease/chronic venous stasis on lower extremities bilaterally.  LABORATORY PANEL:   CBC Recent Labs  Lab 12/21/17 0750  WBC 7.5  HGB 15.7  HCT 47.0  PLT 154   ------------------------------------------------------------------------------------------------------------------  Chemistries  Recent Labs  Lab 12/21/17 0750  NA 134*  K 4.1  CL 100*  CO2 26  GLUCOSE 153*  BUN 15  CREATININE 0.89  CALCIUM 8.5*  AST 39  ALT 34  ALKPHOS 125  BILITOT 1.1   ------------------------------------------------------------------------------------------------------------------  Cardiac Enzymes Recent Labs  Lab 12/21/17 0750  TROPONINI <0.03   ------------------------------------------------------------------------------------------------------------------  RADIOLOGY:  Ct Head Wo Contrast  Result Date: 12/21/2017 CLINICAL DATA:  Multiple recent falls EXAM: CT HEAD WITHOUT CONTRAST TECHNIQUE: Contiguous axial images were obtained from the base of the skull through the vertex without intravenous contrast. COMPARISON:  03/11/2016 FINDINGS: Brain: Scattered chronic white matter ischemic change is noted. No findings to suggest acute hemorrhage, acute infarction or space-occupying mass lesion are noted. Vascular: No hyperdense vessel or unexpected calcification. Skull: Normal. Negative for fracture or focal lesion. Sinuses/Orbits: No acute finding. Other: None. IMPRESSION: Chronic white matter ischemic change without acute abnormality. Electronically Signed   By: Inez Catalina M.D.   On: 12/21/2017 09:32   Ct Lumbar Spine Wo Contrast  Result Date: 12/21/2017 CLINICAL  DATA:  Back pain after minor trauma.  Initial encounter. EXAM: CT LUMBAR SPINE WITHOUT CONTRAST TECHNIQUE: Multidetector CT imaging of the lumbar spine was performed without intravenous contrast administration. Multiplanar CT image reconstructions were also generated. COMPARISON:  Abdominal CT 06/17/2016 FINDINGS: Segmentation: Based on the lowest ribs there is a partially segmented L5 vertebra. Twelve paired ribs are seen on a 2007 chest CT. Alignment: Grade 1 anterolisthesis at L4-5, facet mediated. Mild lower lumbar levoscoliosis. Vertebrae: L1 body fracture with an oblique fracture plane across the anterior cortex of the body with mild, less than 25%, height loss. No retropulsion is seen. Mild associated paravertebral edematous appearance at this level on axial soft tissue windows. Remote L2 compression fracture with cement augmentation. No evidence of bone lesion Paraspinal and other soft tissues: Mild paravertebral edema as noted above.Partially visualized abdominal aortic aneurysm with chronic dissection showing intimal flap displacement. Maximal diameter is 3.2  cm, stable from 2017 CT. Disc levels: T12- L1: Mild spondylosis with disc degeneration. Gas containing cleft within the central disc. Asymmetric left facet spurring. Mild left foraminal and subarticular recess stenosis. L1-L2: Disc degeneration with narrowing and mild endplate ridging. Degenerative posterior element hypertrophy. High-grade spinal stenosis. L2-L3: Disc degeneration with narrowing and bulging. Posterior element hypertrophy with right more than left foraminal impingement. Moderate to advanced spinal stenosis. L3-L4: Degenerative posterior element hypertrophy. Disc narrowing and bulging. Right more than left foraminal impingement. High-grade spinal stenosis. L4-L5: Spondylosis with bridging osteophyte. Advanced facet arthropathy with anterolisthesis. Bilateral foraminal impingement, greater on the right. Moderate to advanced spinal  stenosis. L5-S1:Incomplete segmentation.  No impingement IMPRESSION: 1. Based on the lowest ribs there is a transitional L5 vertebra that is incompletely segmented from the sacrum. 2. L1 vertebral body fracture with mild height loss, favored acute. No retropulsion. 3. L2 remote compression fracture. 4. Advanced degenerative disease with scoliosis. High-grade spinal stenosis seen at the disc levels from L1-2 to L4-5. Electronically Signed   By: Monte Fantasia M.D.   On: 12/21/2017 09:21   Ct Pelvis Wo Contrast  Result Date: 12/21/2017 CLINICAL DATA:  84 year old male with pain after multiple recent falls. Pelvis and left inguinal pain in addition of low back pain. EXAM: CT PELVIS WITHOUT CONTRAST TECHNIQUE: Multidetector CT imaging of the pelvis was performed following the standard protocol without intravenous contrast. COMPARISON:  Lumbar spine CT 0840 hours today. CT Abdomen and Pelvis 09/18/2015. FINDINGS: Urinary Tract:  Negative; the kidneys are not visible. Bowel: Diverticulosis of the large bowel. No dilated or inflamed visible large or small bowel loops. Normal appendix aside from hyperdense material in the lumen which might reflect small appendix a lith. Vascular/Lymphatic: Chronic Calcified aortic atherosclerosis. Infrarenal abdominal aortic aneurysm measuring 35-36 millimeters maximal diameter. This was 33 millimeters in 2017. No lymphadenopathy. Reproductive:  Negative. Other:  No pelvic free fluid. Musculoskeletal: Osteopenia. Chronic proximal left femur ORIF appears stable since 2017 with united trochanter comminution fragments. The left femoral head remains normally located. Chronic left SI joint ankylosis. The left sacrum, left hemipelvis, and acetabulum appears stable and intact. Chronic right SI joint ankylosis. The right sacrum, right hemipelvis, and proximal right femur appears stable and intact. No superficial soft tissue injury identified. Lumbar findings today are reported separately.  IMPRESSION: 1. No fracture identified in the pelvis or proximal femurs. Chronic left femur ORIF. Chronic SI joint ankylosis. 2. Aortic Atherosclerosis (ICD10-I70.0) with chronic infrarenal abdominal aortic aneurysm mildly progressed since 2017, up to 36 millimeters diameter. Recommend followup by Aortic Ultrasound in 2 years. This recommendation follows ACR consensus guidelines: White Paper of the ACR Incidental Findings Committee II on Vascular Findings. J Am Coll Radiol 2013; 10:789-794. 3. Large bowel diverticulosis. Electronically Signed   By: Genevie Ann M.D.   On: 12/21/2017 09:39     IMPRESSION AND PLAN:   84 year old male with past medical history of hypertension, hyperlipidemia, hypothyroidism, peripheral vascular disease, history of previous TIA, emphysema/COPD, osteoarthritis, early cognitive decline who presents to the hospital after a fall and complaining of some back pain with some altered mental status.  1.  Altered mental status-etiology unclear.  Suspected to be multifactorial nature.  Patient likely has some mild underlying dementia combined with sundowning.   -Patient is also on high doses of gabapentin which possibly could be contributing to the patient's mental status change.  CT head is negative for acute pathology, patient's urinalysis is negative.  I will reduce the patient's gabapentin dose and follow  mental status.  2.  Back pain-secondary to the mechanical fall.  Patient noted to have a mild compression fracture of the L2 vertebrae.  No acute need for surgical intervention presently. - Supportive care with pain control with oral tramadol.  We will get physical therapy consult.  3.  Recurrent falls-secondary to underlying overall weakness and deconditioning. - We will get physical therapy consult to assess mobility.  4.  Hypothyroidism-continue Synthroid.  5.  Essential hypertension-continue carvedilol, losartan.  6.  BPH-continue Flomax.  7.  Restless leg  syndrome-continue Requip.  All the records are reviewed and case discussed with ED provider. Management plans discussed with the patient, family and they are in agreement.  CODE STATUS: Full code  TOTAL TIME TAKING CARE OF THIS PATIENT: 45 minutes.    Henreitta Leber M.D on 12/21/2017 at 11:41 AM  Between 7am to 6pm - Pager - [phone]  After 6pm go to [email or web address] - password [email or web address] [place]  Office  [phone]  CC: Primary care physician; [name], MD

## 2017-12-21 NOTE — ED Notes (Signed)
Resting supine on bed, wife at bedside, await disposition.

## 2017-12-21 NOTE — Care Management Obs Status (Signed)
MEDICARE OBSERVATION STATUS NOTIFICATION   Patient Details  Name: David Morgan MRN: [id number] Date of Birth: [birth year]   Medicare Observation Status Notification Given:  Yes    Jolly Mango, RN 12/21/2017, 1:48 PM

## 2017-12-21 NOTE — ED Triage Notes (Signed)
States fell from standing position backwards approx 5am while trying to get into chair. Denies dizziness prior to fall. States has balance problems. States low back pain since which is much improved by resting in bed as he is now. States increases when he tries to move his legs.

## 2017-12-21 NOTE — Care Management (Addendum)
Met with patient, wife and family at bedside to present 67. Also discussed discsharge planning. Wife states if PT recommends home health they prefer [facility] for PT, OT and HHA. If they recommend SNF they prefer [facility]. Waiting on PT evaluation.  CSW updated. RNCM following

## 2017-12-22 DIAGNOSIS — M549 Dorsalgia, unspecified: Secondary | ICD-10-CM | POA: Diagnosis not present

## 2017-12-22 DIAGNOSIS — E039 Hypothyroidism, unspecified: Secondary | ICD-10-CM | POA: Diagnosis not present

## 2017-12-22 DIAGNOSIS — L8990 Pressure ulcer of unspecified site, unspecified stage: Secondary | ICD-10-CM

## 2017-12-22 DIAGNOSIS — R4182 Altered mental status, unspecified: Secondary | ICD-10-CM | POA: Diagnosis not present

## 2017-12-22 DIAGNOSIS — R296 Repeated falls: Secondary | ICD-10-CM | POA: Diagnosis not present

## 2017-12-22 LAB — BASIC METABOLIC PANEL
ANION GAP: 10 (ref 5–15)
BUN: 12 mg/dL (ref 6–20)
CO2: 27 mmol/L (ref 22–32)
Calcium: 8.7 mg/dL — ABNORMAL LOW (ref 8.9–10.3)
Chloride: 100 mmol/L — ABNORMAL LOW (ref 101–111)
Creatinine, Ser: 0.74 mg/dL (ref 0.61–1.24)
GLUCOSE: 135 mg/dL — AB (ref 65–99)
Potassium: 3.5 mmol/L (ref 3.5–5.1)
Sodium: 137 mmol/L (ref 135–145)

## 2017-12-22 LAB — CBC
HEMATOCRIT: 49.3 % (ref 40.0–52.0)
Hemoglobin: 16.6 g/dL (ref 13.0–18.0)
MCH: 29.8 pg (ref 26.0–34.0)
MCHC: 33.6 g/dL (ref 32.0–36.0)
MCV: 88.8 fL (ref 80.0–100.0)
PLATELETS: 193 10*3/uL (ref 150–440)
RBC: 5.55 MIL/uL (ref 4.40–5.90)
RDW: 14.6 % — ABNORMAL HIGH (ref 11.5–14.5)
WBC: 8.1 10*3/uL (ref 3.8–10.6)

## 2017-12-22 NOTE — Progress Notes (Signed)
Verbal order to d/c tell and contentious pulse ox by MD Pyreddy. Order placed.

## 2017-12-22 NOTE — Care Management Note (Signed)
Case Management Note  Patient Details  Name: David Morgan MRN: [id number] Date of Birth: May 12, 1925  Subjective/Objective:  Discharging today                  Action/Plan: Notified [facility] with [facility] of discharge. Requested rolling walker from [place] with [facility]. Wife updated on POC and she is in agreement. Patient will discharge home by car.   Expected Discharge Date:  12/22/17               Expected Discharge Plan:  [place]  In-House Referral:     Discharge planning Services  CM Consult  Post Acute Care Choice:  Home Health, Durable Medical Equipment Choice offered to:  Spouse  DME Arranged:  Walker rolling DME Agency:     HH Arranged:  RN, PT, OT, Nurse's Aide [place] Agency:  [place]  Status of Service:  Completed, signed off  If discussed at [place] of Stay Meetings, dates discussed:    Additional Comments:  Jolly Mango, RN 12/22/2017, 4:04 PM

## 2017-12-22 NOTE — Progress Notes (Signed)
Pt given discharge papers by SWAT nurse.

## 2017-12-22 NOTE — Evaluation (Signed)
Physical Therapy Evaluation Patient Details Name: David Morgan MRN: [id number] DOB: [birth year] Today's Date: 12/22/2017   History of Present Illness  84 y.o. male with a known history of hypertension, hyperlipidemia, hypothyroidism, peripheral vascular disease, history of diverticulitis, COPD, who presents to the hospital due to a fall and also complaining of some back pain.  As per the wife patient has had 3-4 falls in the past 3 months.  He also has had some cognitive decline and confusion over the past few years.  Clinical Impression  Pt confused t/o the session and most information gathered from wife.  He was able to follow simple instructions for getting out of bed and trying to do a few light exercises in bed but once up he needed constant cuing to insure safety with ambulation, especially with turns.  Pt pleasant t/o the session, but did struggle with staying on task t/o the session.  Pt is not safe at home in current state, suggesting STR to try to get back to some level of independence with in-home mobility.   Follow Up Recommendations SNF    Equipment Recommendations       Recommendations for Other Services       Precautions / Restrictions Precautions Precautions: Fall Restrictions Weight Bearing Restrictions: No      Mobility  Bed Mobility Overal bed mobility: Needs Assistance Bed Mobility: Supine to Sit     Supine to sit: Mod assist;Max assist     General bed mobility comments: Pt needed relatively heavy assist to get to sitting, did indicate some minimal pain in back with the transition, but ultimately weakness and confusion seemed the biggest issue  Transfers Overall transfer level: Needs assistance Equipment used: Rolling walker (2 wheeled) Transfers: Sit to/from Stand Sit to Stand: Mod assist         General transfer comment: Pt was able to rise with heavy assist/cuing from PT.  He struggled with appropriate walker use and needed constant close  guarding to remain safe  Ambulation/Gait Ambulation/Gait assistance: Min assist;Mod assist Ambulation Distance (Feet): 35 Feet Assistive device: Rolling walker (2 wheeled)       General Gait Details: Pt was able to take very shuffling, unsteady steps with walker.  He was very confused and needed constant cuing to insure safe walker manipulation and not let it get too far away or get center of gravity too far out of walker during turns.  Stairs            Wheelchair Mobility    Modified Rankin (Stroke Patients Only)       Balance Overall balance assessment: Needs assistance Sitting-balance support: Bilateral upper extremity supported;Feet supported Sitting balance-Leahy Scale: Fair Sitting balance - Comments: Pt initially uncomfortable in sitting, did calm and position better with some cuing and time     Standing balance-Leahy Scale: Poor Standing balance comment: Pt reliant on walker and ultimatley struggled with cues from PT to stand taller, use walker appropriately, etc                             Pertinent Vitals/Pain Pain Assessment: No/denies pain    Home Living Family/patient expects to be discharged to:: Unsure Living Arrangements: Spouse/significant other                    Prior Function Level of Independence: Needs assistance   Gait / Transfers Assistance Needed: Pt does use walker, apparently has been having  a harder time using it appropriately.  Only out of the home for MD appointments since the start of the year           Hand Dominance        Extremity/Trunk Assessment   Upper Extremity Assessment Upper Extremity Assessment: Difficult to assess due to impaired cognition    Lower Extremity Assessment Lower Extremity Assessment: Difficult to assess due to impaired cognition       Communication   Communication: (serverely confused)  Cognition Arousal/Alertness: Awake/alert(alert to name/DOB, unsure of date, location,  situation, etc) Behavior During Therapy: Impulsive;Restless Overall Cognitive Status: Impaired/Different from baseline Area of Impairment: Safety/judgement;Following commands;Awareness;Orientation                               General Comments: Per wife, pt with some baseline dementia but not typically this bad      General Comments      Exercises     Assessment/Plan    PT Assessment Patient needs continued PT services  PT Problem List Decreased strength;Decreased range of motion;Decreased activity tolerance;Decreased mobility;Decreased balance;Decreased coordination;Decreased cognition;Decreased knowledge of use of DME;Decreased safety awareness;Decreased knowledge of precautions       PT Treatment Interventions DME instruction;Gait training;Functional mobility training;Therapeutic activities;Neuromuscular re-education;Balance training;Therapeutic exercise;Cognitive remediation;Patient/family education    PT Goals (Current goals can be found in the Care Plan section)  Acute Rehab PT Goals Patient Stated Goal: wife is hoping his safety with ambulation and balance improves to get him back home safely PT Goal Formulation: With family Time For Goal Achievement: 01/05/18 Potential to Achieve Goals: Fair    Frequency Min 2X/week   Barriers to discharge        Co-evaluation               AM-PAC PT "6 Clicks" Daily Activity  Outcome Measure Difficulty turning over in bed (including adjusting bedclothes, sheets and blankets)?: Unable Difficulty moving from lying on back to sitting on the side of the bed? : Unable Difficulty sitting down on and standing up from a chair with arms (e.g., wheelchair, bedside commode, etc,.)?: Unable Help needed moving to and from a bed to chair (including a wheelchair)?: A Little Help needed walking in hospital room?: A Lot Help needed climbing 3-5 steps with a railing? : Total 6 Click Score: 9    End of Session Equipment  Utilized During Treatment: Gait belt Activity Tolerance: (confusion) Patient left: with chair alarm set;with call bell/phone within reach;with family/visitor present   PT Visit Diagnosis: Muscle weakness (generalized) (M62.81);Difficulty in walking, not elsewhere classified (R26.2)    Time: 9528-4132 PT Time Calculation (min) (ACUTE ONLY): 39 min   Charges:   PT Evaluation $PT Eval Low Complexity: 1 Low PT Treatments $Therapeutic Activity: 8-22 mins   PT G Codes:        Kreg Shropshire, DPT 12/22/2017, 2:03 PM

## 2017-12-22 NOTE — Progress Notes (Signed)
Advanced care plan.  Purpose of the Encounter: CODE STATUS Parties in Attendance: Patient and family Patient's Decision Capacity:Good Subjective/Patient's story: Patient admitted for recurrent falls and back pain Objective/Medical story Has compression fractures and gait instability Goals of care determination:  Advance directives discussed with patient and family For now patient wants everything done, which includes cardiac resuscitation, intubation and ventilator if need arises. CODE STATUS: Full code Time spent discussing advanced care planning: 16 minutes

## 2017-12-22 NOTE — Discharge Summary (Signed)
[place] [facility] [place] NAME: David Morgan    MR#:  [id number]  DATE OF BIRTH:  [birth year]  DATE OF ADMISSION:  12/21/2017 ADMITTING PHYSICIAN: Henreitta Leber, MD  DATE OF DISCHARGE: 12/22/2017  6:05 PM  PRIMARY CARE PHYSICIAN: [name], MD   ADMISSION DIAGNOSIS:  Confusion [R41.0] Closed compression fracture of first lumbar vertebra, initial encounter ([place]) [S32.010A]  DISCHARGE DIAGNOSIS:  Altered mental status secondary to medication  L1 compression fracture Back pain  SECONDARY DIAGNOSIS:   Past Medical History:  Diagnosis Date  . Arthritis   . Cancer ([place])   . Claudication ([place])   . Diverticulitis   . Emphysema/COPD ([place])   . History of bladder cancer   . History of carotid artery stenosis   . History of TIA (transient ischemic attack)    2012--  NO RESIDUAL (PER SCAN HAD A PREVIOUS TIA BEFORE 2012)  . Hyperlipidemia   . Hypertension   . Hypothyroidism   . Lesion of bladder   . Mild asthma    NO INHALER  . Nocturia   . Peripheral vascular disease ([place])   . Short of breath on exertion   . Urgency of urination   . Wears glasses      ADMITTING HISTORY David Morgan  is a 84 y.o. male with a known history of hypertension, hyperlipidemia, hypothyroidism, peripheral vascular disease, history of diverticulitis, COPD, who presents to the hospital due to a fall and also complaining of some back pain.  As per the wife patient has had 3-4 falls in the past 3 months.  He also has had some cognitive decline and confusion over the past few years as per the wife.  After his fall this morning EMS was called to the house but patient refused to come to the hospital.  Shortly after EMS left patient continued to complain of significant back pain and difficulty ambulating and therefore agreed to come to the hospital for further evaluation.  Patient CT of the lumbar spine did show a mild compression fracture of the L2 vertebrae.  CT  head and other work-up is essentially benign.  As per the wife patient has continued to be more confused and is has been hallucinating over the past 24 hours.  Given his altered mental status, ongoing back pain and weakness and history of recurrent falls hospitalist services were consulted for admission.  HOSPITAL COURSE:  Patient admitted to medical floor. Pain was controlled with medications. Patient received physical therapy in the hospital.Mental status back to baseline.Patient did not want any surgery for compression fracture of vertebra.He wanted to be discharged to home with physical therapy and home health services.Patient hemodynamically stable and will be discharged to home.  CONSULTS OBTAINED:  PT consult  DRUG ALLERGIES:   Allergies  Allergen Reactions  . Adhesive [Tape] Rash  . Augmentin [Amoxicillin-Pot Clavulanate] Itching, Rash and Other (See Comments)    Has patient had a PCN reaction causing immediate rash, facial/tongue/throat swelling, SOB or lightheadedness with hypotension: No Has patient had a PCN reaction causing severe rash involving mucus membranes or skin necrosis: No Has patient had a PCN reaction that required hospitalization No Has patient had a PCN reaction occurring within the last 10 years: No If all of the above answers are "NO", then may proceed with Cephalosporin use.     DISCHARGE MEDICATIONS:   Allergies as of 12/22/2017      Reactions   Adhesive [tape] Rash   Augmentin [amoxicillin-pot  Clavulanate] Itching, Rash, Other (See Comments)   Has patient had a PCN reaction causing immediate rash, facial/tongue/throat swelling, SOB or lightheadedness with hypotension: No Has patient had a PCN reaction causing severe rash involving mucus membranes or skin necrosis: No Has patient had a PCN reaction that required hospitalization No Has patient had a PCN reaction occurring within the last 10 years: No If all of the above answers are "NO", then may proceed  with Cephalosporin use.      Medication List    STOP taking these medications   gabapentin 300 MG capsule Commonly known as:  NEURONTIN     TAKE these medications   acetaminophen 500 MG tablet Commonly known as:  TYLENOL Take 2 tablets (1,000 mg total) by mouth every 6 (six) hours as needed.   carvedilol 3.125 MG tablet Commonly known as:  COREG Take 3.125 mg by mouth 2 (two) times daily with a meal.   HYDROcodone-Acetaminophen 5-300 MG Tabs Commonly known as:  VICODIN Take 1 tablet by mouth every 4 (four) hours as needed.   levothyroxine 112 MCG tablet Commonly known as:  SYNTHROID, LEVOTHROID Take 112 mcg by mouth daily before breakfast.   losartan 50 MG tablet Commonly known as:  COZAAR Take 50 mg by mouth daily.   MULTI-VITAMINS Tabs Take 1 tablet by mouth daily.   ondansetron 4 MG tablet Commonly known as:  ZOFRAN Take 1 tablet (4 mg total) by mouth every 6 (six) hours as needed for nausea.   rOPINIRole 0.5 MG tablet Commonly known as:  REQUIP Take 0.5 mg by mouth at bedtime.   tamsulosin 0.4 MG Caps capsule Commonly known as:  FLOMAX Take 0.8 mg by mouth every evening.            Durable Medical Equipment  (From admission, onward)        Start     Ordered   12/22/17 1603  For home use only DME Walker rolling  Once    Question:  Patient needs a walker to treat with the following condition  Answer:  Ambulatory dysfunction   12/22/17 1603      Today  Patient seen on day of discharge Back pain better No confusion Mental status back to baseline  VITAL SIGNS:  Blood pressure (!) 143/78, pulse 81, temperature 98.4 F (36.9 C), temperature source Oral, resp. rate (!) 21, height 5\' 6"  (1.676 m), weight 104.3 kg (230 lb), SpO2 94 %.  I/O:    Intake/Output Summary (Last 24 hours) at 12/22/2017 1936 Last data filed at 12/22/2017 1300 Gross per 24 hour  Intake 240 ml  Output 500 ml  Net -260 ml    PHYSICAL EXAMINATION:  Physical  Exam  GENERAL:  84 y.o.-year-old patient lying in the bed with no acute distress.  LUNGS: Normal breath sounds bilaterally, no wheezing, rales,rhonchi or crepitation. No use of accessory muscles of respiration.  CARDIOVASCULAR: S1, S2 normal. No murmurs, rubs, or gallops.  ABDOMEN: Soft, non-tender, non-distended. Bowel sounds present. No organomegaly or mass.  NEUROLOGIC: Moves all 4 extremities. PSYCHIATRIC: The patient is alert and oriented x 3.  SKIN: No obvious rash, lesion, or ulcer.   DATA REVIEW:   CBC Recent Labs  Lab 12/22/17 0416  WBC 8.1  HGB 16.6  HCT 49.3  PLT 193    Chemistries  Recent Labs  Lab 12/21/17 0750 12/22/17 0416  NA 134* 137  K 4.1 3.5  CL 100* 100*  CO2 26 27  GLUCOSE 153* 135*  BUN  15 12  CREATININE 0.89 0.74  CALCIUM 8.5* 8.7*  AST 39  --   ALT 34  --   ALKPHOS 125  --   BILITOT 1.1  --     Cardiac Enzymes Recent Labs  Lab 12/21/17 0750  TROPONINI <0.03    Microbiology Results  Results for orders placed or performed during the hospital encounter of 09/18/15  Anaerobic culture     Status: None   Collection Time: 09/19/15 11:15 AM  Result Value Ref Range Status   Specimen Description ABSCESS  Final   Special Requests NONE  Final   Culture NO ANAEROBES ISOLATED  Final   Report Status 09/23/2015 FINAL  Final  Culture, routine-abscess     Status: None   Collection Time: 09/19/15 11:15 AM  Result Value Ref Range Status   Specimen Description ABSCESS  Final   Special Requests NONE  Final   Gram Stain   Final    TOO NUMEROUS TO COUNT WBC SEEN MODERATE GRAM POSITIVE COCCI    Culture Consistent with normal flora.  Final   Report Status 09/21/2015 FINAL  Final    RADIOLOGY:  Ct Head Wo Contrast  Result Date: 12/21/2017 CLINICAL DATA:  Multiple recent falls EXAM: CT HEAD WITHOUT CONTRAST TECHNIQUE: Contiguous axial images were obtained from the base of the skull through the vertex without intravenous contrast. COMPARISON:   03/11/2016 FINDINGS: Brain: Scattered chronic white matter ischemic change is noted. No findings to suggest acute hemorrhage, acute infarction or space-occupying mass lesion are noted. Vascular: No hyperdense vessel or unexpected calcification. Skull: Normal. Negative for fracture or focal lesion. Sinuses/Orbits: No acute finding. Other: None. IMPRESSION: Chronic white matter ischemic change without acute abnormality. Electronically Signed   By: Inez Catalina M.D.   On: 12/21/2017 09:32   Ct Lumbar Spine Wo Contrast  Result Date: 12/21/2017 CLINICAL DATA:  Back pain after minor trauma.  Initial encounter. EXAM: CT LUMBAR SPINE WITHOUT CONTRAST TECHNIQUE: Multidetector CT imaging of the lumbar spine was performed without intravenous contrast administration. Multiplanar CT image reconstructions were also generated. COMPARISON:  Abdominal CT 06/17/2016 FINDINGS: Segmentation: Based on the lowest ribs there is a partially segmented L5 vertebra. Twelve paired ribs are seen on a 2007 chest CT. Alignment: Grade 1 anterolisthesis at L4-5, facet mediated. Mild lower lumbar levoscoliosis. Vertebrae: L1 body fracture with an oblique fracture plane across the anterior cortex of the body with mild, less than 25%, height loss. No retropulsion is seen. Mild associated paravertebral edematous appearance at this level on axial soft tissue windows. Remote L2 compression fracture with cement augmentation. No evidence of bone lesion Paraspinal and other soft tissues: Mild paravertebral edema as noted above.Partially visualized abdominal aortic aneurysm with chronic dissection showing intimal flap displacement. Maximal diameter is 3.2 cm, stable from 2017 CT. Disc levels: T12- L1: Mild spondylosis with disc degeneration. Gas containing cleft within the central disc. Asymmetric left facet spurring. Mild left foraminal and subarticular recess stenosis. L1-L2: Disc degeneration with narrowing and mild endplate ridging. Degenerative  posterior element hypertrophy. High-grade spinal stenosis. L2-L3: Disc degeneration with narrowing and bulging. Posterior element hypertrophy with right more than left foraminal impingement. Moderate to advanced spinal stenosis. L3-L4: Degenerative posterior element hypertrophy. Disc narrowing and bulging. Right more than left foraminal impingement. High-grade spinal stenosis. L4-L5: Spondylosis with bridging osteophyte. Advanced facet arthropathy with anterolisthesis. Bilateral foraminal impingement, greater on the right. Moderate to advanced spinal stenosis. L5-S1:Incomplete segmentation.  No impingement IMPRESSION: 1. Based on the lowest ribs there is a  transitional L5 vertebra that is incompletely segmented from the sacrum. 2. L1 vertebral body fracture with mild height loss, favored acute. No retropulsion. 3. L2 remote compression fracture. 4. Advanced degenerative disease with scoliosis. High-grade spinal stenosis seen at the disc levels from L1-2 to L4-5. Electronically Signed   By: Monte Fantasia M.D.   On: 12/21/2017 09:21   Ct Pelvis Wo Contrast  Result Date: 12/21/2017 CLINICAL DATA:  84 year old male with pain after multiple recent falls. Pelvis and left inguinal pain in addition of low back pain. EXAM: CT PELVIS WITHOUT CONTRAST TECHNIQUE: Multidetector CT imaging of the pelvis was performed following the standard protocol without intravenous contrast. COMPARISON:  Lumbar spine CT 0840 hours today. CT Abdomen and Pelvis 09/18/2015. FINDINGS: Urinary Tract:  Negative; the kidneys are not visible. Bowel: Diverticulosis of the large bowel. No dilated or inflamed visible large or small bowel loops. Normal appendix aside from hyperdense material in the lumen which might reflect small appendix a lith. Vascular/Lymphatic: Chronic Calcified aortic atherosclerosis. Infrarenal abdominal aortic aneurysm measuring 35-36 millimeters maximal diameter. This was 33 millimeters in 2017. No lymphadenopathy.  Reproductive:  Negative. Other:  No pelvic free fluid. Musculoskeletal: Osteopenia. Chronic proximal left femur ORIF appears stable since 2017 with united trochanter comminution fragments. The left femoral head remains normally located. Chronic left SI joint ankylosis. The left sacrum, left hemipelvis, and acetabulum appears stable and intact. Chronic right SI joint ankylosis. The right sacrum, right hemipelvis, and proximal right femur appears stable and intact. No superficial soft tissue injury identified. Lumbar findings today are reported separately. IMPRESSION: 1. No fracture identified in the pelvis or proximal femurs. Chronic left femur ORIF. Chronic SI joint ankylosis. 2. Aortic Atherosclerosis (ICD10-I70.0) with chronic infrarenal abdominal aortic aneurysm mildly progressed since 2017, up to 36 millimeters diameter. Recommend followup by Aortic Ultrasound in 2 years. This recommendation follows ACR consensus guidelines: White Paper of the ACR Incidental Findings Committee II on Vascular Findings. J Am Coll Radiol 2013; 10:789-794. 3. Large bowel diverticulosis. Electronically Signed   By: Genevie Ann M.D.   On: 12/21/2017 09:39    Follow up with PCP in 1 week.  Management plans discussed with the patient, family and they are in agreement.  CODE STATUS: Full code    Code Status Orders  (From admission, onward)        Start     Ordered   12/21/17 1301  Full code  Continuous     12/21/17 1300    Code Status History    Date Active Date Inactive Code Status Order ID Comments User Context   09/19/2015 1150 09/21/2015 1950 Full Code [id number]  Inez Catalina, MD Inpatient   09/18/2015 1817 09/19/2015 1150 Full Code [id number]  Hubbard Robinson, MD ED   09/03/2015 1919 09/06/2015 1833 Full Code [id number]  Hubbard Robinson, MD Inpatient   09/03/2015 1046 09/03/2015 1919 Full Code [id number]  Hubbard Robinson, MD ED   09/03/2015 1046 09/03/2015 1046 Full Code [id number]  Hubbard Robinson, MD ED    08/06/2015 2031 08/10/2015 1759 Full Code [id number]  Sherri Rad, MD Inpatient   06/28/2015 1657 07/03/2015 1412 Full Code [id number]  Marlyce Huge, MD ED    Advance Directive Documentation     Most Recent Value  Type of Advance Directive  Living will  Pre-existing out of facility DNR order (yellow form or pink MOST form)  -  "MOST" Form in Place?  -      [place]  PATIENT ON DAY OF DISCHARGE: more than 34 minutes.   Saundra Shelling M.D on 12/22/2017 at 7:36 PM  Between 7am to 6pm - Pager - [phone]  After 6pm go to [email or web address] - password [email or web address] [place] [facility]  Office  [phone]  CC: Primary care physician; [name], MD  Note: This dictation was prepared with Dragon dictation along with smaller phrase technology. Any transcriptional errors that result from this process are unintentional.

## 2017-12-24 DIAGNOSIS — Z8673 Personal history of transient ischemic attack (TIA), and cerebral infarction without residual deficits: Secondary | ICD-10-CM | POA: Diagnosis not present

## 2017-12-24 DIAGNOSIS — Z8551 Personal history of malignant neoplasm of bladder: Secondary | ICD-10-CM | POA: Diagnosis not present

## 2017-12-24 DIAGNOSIS — G2581 Restless legs syndrome: Secondary | ICD-10-CM | POA: Diagnosis not present

## 2017-12-24 DIAGNOSIS — G8929 Other chronic pain: Secondary | ICD-10-CM | POA: Diagnosis not present

## 2017-12-24 DIAGNOSIS — Z9181 History of falling: Secondary | ICD-10-CM | POA: Diagnosis not present

## 2017-12-24 DIAGNOSIS — M545 Low back pain: Secondary | ICD-10-CM | POA: Diagnosis not present

## 2017-12-24 DIAGNOSIS — S32020D Wedge compression fracture of second lumbar vertebra, subsequent encounter for fracture with routine healing: Secondary | ICD-10-CM | POA: Diagnosis not present

## 2017-12-24 DIAGNOSIS — J449 Chronic obstructive pulmonary disease, unspecified: Secondary | ICD-10-CM | POA: Diagnosis not present

## 2017-12-24 DIAGNOSIS — E039 Hypothyroidism, unspecified: Secondary | ICD-10-CM | POA: Diagnosis not present

## 2017-12-24 DIAGNOSIS — R419 Unspecified symptoms and signs involving cognitive functions and awareness: Secondary | ICD-10-CM | POA: Diagnosis not present

## 2017-12-24 DIAGNOSIS — R296 Repeated falls: Secondary | ICD-10-CM | POA: Diagnosis not present

## 2017-12-24 DIAGNOSIS — Z96651 Presence of right artificial knee joint: Secondary | ICD-10-CM | POA: Diagnosis not present

## 2017-12-24 DIAGNOSIS — R351 Nocturia: Secondary | ICD-10-CM | POA: Diagnosis not present

## 2017-12-24 DIAGNOSIS — N401 Enlarged prostate with lower urinary tract symptoms: Secondary | ICD-10-CM | POA: Diagnosis not present

## 2017-12-24 DIAGNOSIS — I739 Peripheral vascular disease, unspecified: Secondary | ICD-10-CM | POA: Diagnosis not present

## 2017-12-24 DIAGNOSIS — F17210 Nicotine dependence, cigarettes, uncomplicated: Secondary | ICD-10-CM | POA: Diagnosis not present

## 2017-12-24 DIAGNOSIS — I10 Essential (primary) hypertension: Secondary | ICD-10-CM | POA: Diagnosis not present

## 2017-12-24 DIAGNOSIS — H9193 Unspecified hearing loss, bilateral: Secondary | ICD-10-CM | POA: Diagnosis not present

## 2017-12-24 DIAGNOSIS — I6529 Occlusion and stenosis of unspecified carotid artery: Secondary | ICD-10-CM | POA: Diagnosis not present

## 2017-12-28 DIAGNOSIS — Z09 Encounter for follow-up examination after completed treatment for conditions other than malignant neoplasm: Secondary | ICD-10-CM | POA: Diagnosis not present

## 2017-12-28 DIAGNOSIS — E7800 Pure hypercholesterolemia, unspecified: Secondary | ICD-10-CM | POA: Diagnosis not present

## 2017-12-28 DIAGNOSIS — R531 Weakness: Secondary | ICD-10-CM | POA: Diagnosis not present

## 2017-12-28 DIAGNOSIS — I10 Essential (primary) hypertension: Secondary | ICD-10-CM | POA: Diagnosis not present

## 2017-12-28 DIAGNOSIS — E079 Disorder of thyroid, unspecified: Secondary | ICD-10-CM | POA: Diagnosis not present

## 2018-01-07 DIAGNOSIS — G8929 Other chronic pain: Secondary | ICD-10-CM | POA: Diagnosis not present

## 2018-01-07 DIAGNOSIS — I10 Essential (primary) hypertension: Secondary | ICD-10-CM | POA: Diagnosis not present

## 2018-01-07 DIAGNOSIS — G2581 Restless legs syndrome: Secondary | ICD-10-CM | POA: Diagnosis not present

## 2018-01-07 DIAGNOSIS — R351 Nocturia: Secondary | ICD-10-CM | POA: Diagnosis not present

## 2018-01-07 DIAGNOSIS — E039 Hypothyroidism, unspecified: Secondary | ICD-10-CM | POA: Diagnosis not present

## 2018-01-07 DIAGNOSIS — I739 Peripheral vascular disease, unspecified: Secondary | ICD-10-CM | POA: Diagnosis not present

## 2018-01-07 DIAGNOSIS — J449 Chronic obstructive pulmonary disease, unspecified: Secondary | ICD-10-CM | POA: Diagnosis not present

## 2018-01-07 DIAGNOSIS — N401 Enlarged prostate with lower urinary tract symptoms: Secondary | ICD-10-CM | POA: Diagnosis not present

## 2018-01-07 DIAGNOSIS — R296 Repeated falls: Secondary | ICD-10-CM | POA: Diagnosis not present

## 2018-01-07 DIAGNOSIS — Z96651 Presence of right artificial knee joint: Secondary | ICD-10-CM | POA: Diagnosis not present

## 2018-01-07 DIAGNOSIS — H9193 Unspecified hearing loss, bilateral: Secondary | ICD-10-CM | POA: Diagnosis not present

## 2018-01-07 DIAGNOSIS — Z9181 History of falling: Secondary | ICD-10-CM | POA: Diagnosis not present

## 2018-01-07 DIAGNOSIS — F17210 Nicotine dependence, cigarettes, uncomplicated: Secondary | ICD-10-CM | POA: Diagnosis not present

## 2018-01-07 DIAGNOSIS — S32020D Wedge compression fracture of second lumbar vertebra, subsequent encounter for fracture with routine healing: Secondary | ICD-10-CM | POA: Diagnosis not present

## 2018-01-07 DIAGNOSIS — Z8673 Personal history of transient ischemic attack (TIA), and cerebral infarction without residual deficits: Secondary | ICD-10-CM | POA: Diagnosis not present

## 2018-01-07 DIAGNOSIS — I6529 Occlusion and stenosis of unspecified carotid artery: Secondary | ICD-10-CM | POA: Diagnosis not present

## 2018-01-07 DIAGNOSIS — Z8551 Personal history of malignant neoplasm of bladder: Secondary | ICD-10-CM | POA: Diagnosis not present

## 2018-01-07 DIAGNOSIS — R419 Unspecified symptoms and signs involving cognitive functions and awareness: Secondary | ICD-10-CM | POA: Diagnosis not present

## 2018-01-07 DIAGNOSIS — M545 Low back pain: Secondary | ICD-10-CM | POA: Diagnosis not present

## 2018-01-13 DIAGNOSIS — S32020D Wedge compression fracture of second lumbar vertebra, subsequent encounter for fracture with routine healing: Secondary | ICD-10-CM | POA: Diagnosis not present

## 2018-01-13 DIAGNOSIS — R419 Unspecified symptoms and signs involving cognitive functions and awareness: Secondary | ICD-10-CM | POA: Diagnosis not present

## 2018-01-13 DIAGNOSIS — M545 Low back pain: Secondary | ICD-10-CM | POA: Diagnosis not present

## 2018-01-13 DIAGNOSIS — N401 Enlarged prostate with lower urinary tract symptoms: Secondary | ICD-10-CM | POA: Diagnosis not present

## 2018-01-13 DIAGNOSIS — J449 Chronic obstructive pulmonary disease, unspecified: Secondary | ICD-10-CM | POA: Diagnosis not present

## 2018-01-13 DIAGNOSIS — R351 Nocturia: Secondary | ICD-10-CM | POA: Diagnosis not present

## 2018-01-13 DIAGNOSIS — G2581 Restless legs syndrome: Secondary | ICD-10-CM | POA: Diagnosis not present

## 2018-01-13 DIAGNOSIS — Z96651 Presence of right artificial knee joint: Secondary | ICD-10-CM | POA: Diagnosis not present

## 2018-01-13 DIAGNOSIS — I739 Peripheral vascular disease, unspecified: Secondary | ICD-10-CM | POA: Diagnosis not present

## 2018-01-13 DIAGNOSIS — I6529 Occlusion and stenosis of unspecified carotid artery: Secondary | ICD-10-CM | POA: Diagnosis not present

## 2018-01-13 DIAGNOSIS — Z9181 History of falling: Secondary | ICD-10-CM | POA: Diagnosis not present

## 2018-01-13 DIAGNOSIS — H9193 Unspecified hearing loss, bilateral: Secondary | ICD-10-CM | POA: Diagnosis not present

## 2018-01-13 DIAGNOSIS — E039 Hypothyroidism, unspecified: Secondary | ICD-10-CM | POA: Diagnosis not present

## 2018-01-13 DIAGNOSIS — G8929 Other chronic pain: Secondary | ICD-10-CM | POA: Diagnosis not present

## 2018-01-13 DIAGNOSIS — R296 Repeated falls: Secondary | ICD-10-CM | POA: Diagnosis not present

## 2018-01-13 DIAGNOSIS — Z8551 Personal history of malignant neoplasm of bladder: Secondary | ICD-10-CM | POA: Diagnosis not present

## 2018-01-13 DIAGNOSIS — F17210 Nicotine dependence, cigarettes, uncomplicated: Secondary | ICD-10-CM | POA: Diagnosis not present

## 2018-01-13 DIAGNOSIS — I10 Essential (primary) hypertension: Secondary | ICD-10-CM | POA: Diagnosis not present

## 2018-01-13 DIAGNOSIS — Z8673 Personal history of transient ischemic attack (TIA), and cerebral infarction without residual deficits: Secondary | ICD-10-CM | POA: Diagnosis not present

## 2018-05-24 DIAGNOSIS — E079 Disorder of thyroid, unspecified: Secondary | ICD-10-CM | POA: Diagnosis not present

## 2018-05-24 DIAGNOSIS — Z131 Encounter for screening for diabetes mellitus: Secondary | ICD-10-CM | POA: Diagnosis not present

## 2018-05-24 DIAGNOSIS — R739 Hyperglycemia, unspecified: Secondary | ICD-10-CM | POA: Diagnosis not present

## 2018-05-24 DIAGNOSIS — I10 Essential (primary) hypertension: Secondary | ICD-10-CM | POA: Diagnosis not present

## 2018-05-24 DIAGNOSIS — R531 Weakness: Secondary | ICD-10-CM | POA: Diagnosis not present

## 2018-05-24 DIAGNOSIS — Z09 Encounter for follow-up examination after completed treatment for conditions other than malignant neoplasm: Secondary | ICD-10-CM | POA: Diagnosis not present

## 2018-05-24 DIAGNOSIS — E7800 Pure hypercholesterolemia, unspecified: Secondary | ICD-10-CM | POA: Diagnosis not present

## 2018-05-31 DIAGNOSIS — Z9181 History of falling: Secondary | ICD-10-CM | POA: Diagnosis not present

## 2018-05-31 DIAGNOSIS — Z0000 Encounter for general adult medical examination without abnormal findings: Secondary | ICD-10-CM | POA: Diagnosis not present

## 2018-05-31 DIAGNOSIS — E7800 Pure hypercholesterolemia, unspecified: Secondary | ICD-10-CM | POA: Diagnosis not present

## 2018-05-31 DIAGNOSIS — H9190 Unspecified hearing loss, unspecified ear: Secondary | ICD-10-CM | POA: Diagnosis not present

## 2018-05-31 DIAGNOSIS — I10 Essential (primary) hypertension: Secondary | ICD-10-CM | POA: Diagnosis not present

## 2018-05-31 DIAGNOSIS — F0390 Unspecified dementia without behavioral disturbance: Secondary | ICD-10-CM | POA: Diagnosis not present

## 2018-05-31 DIAGNOSIS — R262 Difficulty in walking, not elsewhere classified: Secondary | ICD-10-CM | POA: Diagnosis not present

## 2018-05-31 DIAGNOSIS — K5720 Diverticulitis of large intestine with perforation and abscess without bleeding: Secondary | ICD-10-CM | POA: Diagnosis not present

## 2018-05-31 DIAGNOSIS — E1165 Type 2 diabetes mellitus with hyperglycemia: Secondary | ICD-10-CM | POA: Diagnosis not present

## 2018-05-31 DIAGNOSIS — E1140 Type 2 diabetes mellitus with diabetic neuropathy, unspecified: Secondary | ICD-10-CM | POA: Diagnosis not present

## 2018-05-31 DIAGNOSIS — Z23 Encounter for immunization: Secondary | ICD-10-CM | POA: Diagnosis not present

## 2018-06-04 DIAGNOSIS — Z96651 Presence of right artificial knee joint: Secondary | ICD-10-CM | POA: Diagnosis not present

## 2018-06-04 DIAGNOSIS — E1140 Type 2 diabetes mellitus with diabetic neuropathy, unspecified: Secondary | ICD-10-CM | POA: Diagnosis not present

## 2018-06-04 DIAGNOSIS — I10 Essential (primary) hypertension: Secondary | ICD-10-CM | POA: Diagnosis not present

## 2018-06-04 DIAGNOSIS — Z9181 History of falling: Secondary | ICD-10-CM | POA: Diagnosis not present

## 2018-06-04 DIAGNOSIS — F0390 Unspecified dementia without behavioral disturbance: Secondary | ICD-10-CM | POA: Diagnosis not present

## 2018-06-04 DIAGNOSIS — J45909 Unspecified asthma, uncomplicated: Secondary | ICD-10-CM | POA: Diagnosis not present

## 2018-06-04 DIAGNOSIS — N400 Enlarged prostate without lower urinary tract symptoms: Secondary | ICD-10-CM | POA: Diagnosis not present

## 2018-06-04 DIAGNOSIS — Z87891 Personal history of nicotine dependence: Secondary | ICD-10-CM | POA: Diagnosis not present

## 2018-06-04 DIAGNOSIS — K5790 Diverticulosis of intestine, part unspecified, without perforation or abscess without bleeding: Secondary | ICD-10-CM | POA: Diagnosis not present

## 2018-06-04 DIAGNOSIS — M1990 Unspecified osteoarthritis, unspecified site: Secondary | ICD-10-CM | POA: Diagnosis not present

## 2018-06-04 DIAGNOSIS — H9190 Unspecified hearing loss, unspecified ear: Secondary | ICD-10-CM | POA: Diagnosis not present

## 2018-06-04 DIAGNOSIS — E785 Hyperlipidemia, unspecified: Secondary | ICD-10-CM | POA: Diagnosis not present

## 2018-06-10 DIAGNOSIS — E785 Hyperlipidemia, unspecified: Secondary | ICD-10-CM | POA: Diagnosis not present

## 2018-06-10 DIAGNOSIS — Z87891 Personal history of nicotine dependence: Secondary | ICD-10-CM | POA: Diagnosis not present

## 2018-06-10 DIAGNOSIS — Z9181 History of falling: Secondary | ICD-10-CM | POA: Diagnosis not present

## 2018-06-10 DIAGNOSIS — M1990 Unspecified osteoarthritis, unspecified site: Secondary | ICD-10-CM | POA: Diagnosis not present

## 2018-06-10 DIAGNOSIS — Z96651 Presence of right artificial knee joint: Secondary | ICD-10-CM | POA: Diagnosis not present

## 2018-06-10 DIAGNOSIS — F0390 Unspecified dementia without behavioral disturbance: Secondary | ICD-10-CM | POA: Diagnosis not present

## 2018-06-10 DIAGNOSIS — E1140 Type 2 diabetes mellitus with diabetic neuropathy, unspecified: Secondary | ICD-10-CM | POA: Diagnosis not present

## 2018-06-10 DIAGNOSIS — I10 Essential (primary) hypertension: Secondary | ICD-10-CM | POA: Diagnosis not present

## 2018-06-10 DIAGNOSIS — N400 Enlarged prostate without lower urinary tract symptoms: Secondary | ICD-10-CM | POA: Diagnosis not present

## 2018-06-10 DIAGNOSIS — H9190 Unspecified hearing loss, unspecified ear: Secondary | ICD-10-CM | POA: Diagnosis not present

## 2018-06-10 DIAGNOSIS — K5790 Diverticulosis of intestine, part unspecified, without perforation or abscess without bleeding: Secondary | ICD-10-CM | POA: Diagnosis not present

## 2018-06-10 DIAGNOSIS — J45909 Unspecified asthma, uncomplicated: Secondary | ICD-10-CM | POA: Diagnosis not present

## 2018-06-18 DIAGNOSIS — J45909 Unspecified asthma, uncomplicated: Secondary | ICD-10-CM | POA: Diagnosis not present

## 2018-06-18 DIAGNOSIS — E1140 Type 2 diabetes mellitus with diabetic neuropathy, unspecified: Secondary | ICD-10-CM | POA: Diagnosis not present

## 2018-06-18 DIAGNOSIS — M1990 Unspecified osteoarthritis, unspecified site: Secondary | ICD-10-CM | POA: Diagnosis not present

## 2018-06-18 DIAGNOSIS — H9190 Unspecified hearing loss, unspecified ear: Secondary | ICD-10-CM | POA: Diagnosis not present

## 2018-06-18 DIAGNOSIS — I10 Essential (primary) hypertension: Secondary | ICD-10-CM | POA: Diagnosis not present

## 2018-06-18 DIAGNOSIS — F0390 Unspecified dementia without behavioral disturbance: Secondary | ICD-10-CM | POA: Diagnosis not present

## 2018-06-18 DIAGNOSIS — K5790 Diverticulosis of intestine, part unspecified, without perforation or abscess without bleeding: Secondary | ICD-10-CM | POA: Diagnosis not present

## 2018-06-18 DIAGNOSIS — N400 Enlarged prostate without lower urinary tract symptoms: Secondary | ICD-10-CM | POA: Diagnosis not present

## 2018-06-19 DIAGNOSIS — Z96651 Presence of right artificial knee joint: Secondary | ICD-10-CM | POA: Diagnosis not present

## 2018-06-19 DIAGNOSIS — H9190 Unspecified hearing loss, unspecified ear: Secondary | ICD-10-CM | POA: Diagnosis not present

## 2018-06-19 DIAGNOSIS — F0390 Unspecified dementia without behavioral disturbance: Secondary | ICD-10-CM | POA: Diagnosis not present

## 2018-06-19 DIAGNOSIS — I10 Essential (primary) hypertension: Secondary | ICD-10-CM | POA: Diagnosis not present

## 2018-06-19 DIAGNOSIS — J45909 Unspecified asthma, uncomplicated: Secondary | ICD-10-CM | POA: Diagnosis not present

## 2018-06-19 DIAGNOSIS — Z9181 History of falling: Secondary | ICD-10-CM | POA: Diagnosis not present

## 2018-06-19 DIAGNOSIS — E1140 Type 2 diabetes mellitus with diabetic neuropathy, unspecified: Secondary | ICD-10-CM | POA: Diagnosis not present

## 2018-06-19 DIAGNOSIS — M1990 Unspecified osteoarthritis, unspecified site: Secondary | ICD-10-CM | POA: Diagnosis not present

## 2018-06-19 DIAGNOSIS — Z87891 Personal history of nicotine dependence: Secondary | ICD-10-CM | POA: Diagnosis not present

## 2018-06-19 DIAGNOSIS — K5790 Diverticulosis of intestine, part unspecified, without perforation or abscess without bleeding: Secondary | ICD-10-CM | POA: Diagnosis not present

## 2018-06-19 DIAGNOSIS — E785 Hyperlipidemia, unspecified: Secondary | ICD-10-CM | POA: Diagnosis not present

## 2018-06-19 DIAGNOSIS — N400 Enlarged prostate without lower urinary tract symptoms: Secondary | ICD-10-CM | POA: Diagnosis not present

## 2018-06-21 DIAGNOSIS — F0390 Unspecified dementia without behavioral disturbance: Secondary | ICD-10-CM | POA: Diagnosis not present

## 2018-06-21 DIAGNOSIS — H9190 Unspecified hearing loss, unspecified ear: Secondary | ICD-10-CM | POA: Diagnosis not present

## 2018-06-21 DIAGNOSIS — Z96651 Presence of right artificial knee joint: Secondary | ICD-10-CM | POA: Diagnosis not present

## 2018-06-21 DIAGNOSIS — E1140 Type 2 diabetes mellitus with diabetic neuropathy, unspecified: Secondary | ICD-10-CM | POA: Diagnosis not present

## 2018-06-21 DIAGNOSIS — K5790 Diverticulosis of intestine, part unspecified, without perforation or abscess without bleeding: Secondary | ICD-10-CM | POA: Diagnosis not present

## 2018-06-21 DIAGNOSIS — Z9181 History of falling: Secondary | ICD-10-CM | POA: Diagnosis not present

## 2018-06-21 DIAGNOSIS — N400 Enlarged prostate without lower urinary tract symptoms: Secondary | ICD-10-CM | POA: Diagnosis not present

## 2018-06-21 DIAGNOSIS — E785 Hyperlipidemia, unspecified: Secondary | ICD-10-CM | POA: Diagnosis not present

## 2018-06-21 DIAGNOSIS — I10 Essential (primary) hypertension: Secondary | ICD-10-CM | POA: Diagnosis not present

## 2018-06-21 DIAGNOSIS — M1990 Unspecified osteoarthritis, unspecified site: Secondary | ICD-10-CM | POA: Diagnosis not present

## 2018-06-21 DIAGNOSIS — Z87891 Personal history of nicotine dependence: Secondary | ICD-10-CM | POA: Diagnosis not present

## 2018-06-21 DIAGNOSIS — J45909 Unspecified asthma, uncomplicated: Secondary | ICD-10-CM | POA: Diagnosis not present

## 2018-10-04 DIAGNOSIS — E1140 Type 2 diabetes mellitus with diabetic neuropathy, unspecified: Secondary | ICD-10-CM | POA: Diagnosis not present

## 2018-10-04 DIAGNOSIS — E7800 Pure hypercholesterolemia, unspecified: Secondary | ICD-10-CM | POA: Diagnosis not present

## 2018-10-04 DIAGNOSIS — R8290 Unspecified abnormal findings in urine: Secondary | ICD-10-CM | POA: Diagnosis not present

## 2018-10-04 DIAGNOSIS — E1165 Type 2 diabetes mellitus with hyperglycemia: Secondary | ICD-10-CM | POA: Diagnosis not present

## 2018-10-04 DIAGNOSIS — I10 Essential (primary) hypertension: Secondary | ICD-10-CM | POA: Diagnosis not present

## 2018-10-04 DIAGNOSIS — H9190 Unspecified hearing loss, unspecified ear: Secondary | ICD-10-CM | POA: Diagnosis not present

## 2018-10-04 DIAGNOSIS — R262 Difficulty in walking, not elsewhere classified: Secondary | ICD-10-CM | POA: Diagnosis not present

## 2018-10-04 DIAGNOSIS — Z9181 History of falling: Secondary | ICD-10-CM | POA: Diagnosis not present

## 2018-10-04 DIAGNOSIS — F0390 Unspecified dementia without behavioral disturbance: Secondary | ICD-10-CM | POA: Diagnosis not present

## 2018-10-11 DIAGNOSIS — F0390 Unspecified dementia without behavioral disturbance: Secondary | ICD-10-CM | POA: Diagnosis not present

## 2018-10-11 DIAGNOSIS — E1165 Type 2 diabetes mellitus with hyperglycemia: Secondary | ICD-10-CM | POA: Diagnosis not present

## 2018-10-11 DIAGNOSIS — I872 Venous insufficiency (chronic) (peripheral): Secondary | ICD-10-CM | POA: Diagnosis not present

## 2018-10-11 DIAGNOSIS — Z0000 Encounter for general adult medical examination without abnormal findings: Secondary | ICD-10-CM | POA: Diagnosis not present

## 2018-10-11 DIAGNOSIS — R262 Difficulty in walking, not elsewhere classified: Secondary | ICD-10-CM | POA: Diagnosis not present

## 2018-10-11 DIAGNOSIS — E079 Disorder of thyroid, unspecified: Secondary | ICD-10-CM | POA: Diagnosis not present

## 2018-10-11 DIAGNOSIS — I10 Essential (primary) hypertension: Secondary | ICD-10-CM | POA: Diagnosis not present

## 2019-01-31 DIAGNOSIS — Z0000 Encounter for general adult medical examination without abnormal findings: Secondary | ICD-10-CM | POA: Diagnosis not present

## 2019-01-31 DIAGNOSIS — E1165 Type 2 diabetes mellitus with hyperglycemia: Secondary | ICD-10-CM | POA: Diagnosis not present

## 2019-01-31 DIAGNOSIS — I10 Essential (primary) hypertension: Secondary | ICD-10-CM | POA: Diagnosis not present

## 2019-01-31 DIAGNOSIS — E079 Disorder of thyroid, unspecified: Secondary | ICD-10-CM | POA: Diagnosis not present

## 2019-01-31 DIAGNOSIS — I872 Venous insufficiency (chronic) (peripheral): Secondary | ICD-10-CM | POA: Diagnosis not present

## 2019-01-31 DIAGNOSIS — R262 Difficulty in walking, not elsewhere classified: Secondary | ICD-10-CM | POA: Diagnosis not present

## 2019-01-31 DIAGNOSIS — F0390 Unspecified dementia without behavioral disturbance: Secondary | ICD-10-CM | POA: Diagnosis not present

## 2019-02-01 DIAGNOSIS — B351 Tinea unguium: Secondary | ICD-10-CM | POA: Diagnosis not present

## 2019-02-01 DIAGNOSIS — M79675 Pain in left toe(s): Secondary | ICD-10-CM | POA: Diagnosis not present

## 2019-02-01 DIAGNOSIS — E119 Type 2 diabetes mellitus without complications: Secondary | ICD-10-CM | POA: Diagnosis not present

## 2019-02-01 DIAGNOSIS — M779 Enthesopathy, unspecified: Secondary | ICD-10-CM | POA: Diagnosis not present

## 2019-02-01 DIAGNOSIS — L97511 Non-pressure chronic ulcer of other part of right foot limited to breakdown of skin: Secondary | ICD-10-CM | POA: Diagnosis not present

## 2019-02-01 DIAGNOSIS — M79674 Pain in right toe(s): Secondary | ICD-10-CM | POA: Diagnosis not present

## 2019-02-01 DIAGNOSIS — L851 Acquired keratosis [keratoderma] palmaris et plantaris: Secondary | ICD-10-CM | POA: Diagnosis not present

## 2019-02-07 DIAGNOSIS — E1165 Type 2 diabetes mellitus with hyperglycemia: Secondary | ICD-10-CM | POA: Diagnosis not present

## 2019-02-07 DIAGNOSIS — H9190 Unspecified hearing loss, unspecified ear: Secondary | ICD-10-CM | POA: Diagnosis not present

## 2019-02-07 DIAGNOSIS — I10 Essential (primary) hypertension: Secondary | ICD-10-CM | POA: Diagnosis not present

## 2019-02-07 DIAGNOSIS — Z0000 Encounter for general adult medical examination without abnormal findings: Secondary | ICD-10-CM | POA: Diagnosis not present

## 2019-02-07 DIAGNOSIS — E7800 Pure hypercholesterolemia, unspecified: Secondary | ICD-10-CM | POA: Diagnosis not present

## 2019-02-07 DIAGNOSIS — R262 Difficulty in walking, not elsewhere classified: Secondary | ICD-10-CM | POA: Diagnosis not present

## 2019-02-07 DIAGNOSIS — F0390 Unspecified dementia without behavioral disturbance: Secondary | ICD-10-CM | POA: Diagnosis not present

## 2019-03-01 DIAGNOSIS — L97511 Non-pressure chronic ulcer of other part of right foot limited to breakdown of skin: Secondary | ICD-10-CM | POA: Diagnosis not present

## 2019-03-22 DIAGNOSIS — L97511 Non-pressure chronic ulcer of other part of right foot limited to breakdown of skin: Secondary | ICD-10-CM | POA: Diagnosis not present

## 2019-04-26 DIAGNOSIS — L97511 Non-pressure chronic ulcer of other part of right foot limited to breakdown of skin: Secondary | ICD-10-CM | POA: Diagnosis not present

## 2019-05-27 DIAGNOSIS — L97511 Non-pressure chronic ulcer of other part of right foot limited to breakdown of skin: Secondary | ICD-10-CM | POA: Diagnosis not present

## 2019-06-02 DIAGNOSIS — F0390 Unspecified dementia without behavioral disturbance: Secondary | ICD-10-CM | POA: Diagnosis not present

## 2019-06-02 DIAGNOSIS — R262 Difficulty in walking, not elsewhere classified: Secondary | ICD-10-CM | POA: Diagnosis not present

## 2019-06-02 DIAGNOSIS — E7800 Pure hypercholesterolemia, unspecified: Secondary | ICD-10-CM | POA: Diagnosis not present

## 2019-06-02 DIAGNOSIS — Z125 Encounter for screening for malignant neoplasm of prostate: Secondary | ICD-10-CM | POA: Diagnosis not present

## 2019-06-02 DIAGNOSIS — I10 Essential (primary) hypertension: Secondary | ICD-10-CM | POA: Diagnosis not present

## 2019-06-02 DIAGNOSIS — H9190 Unspecified hearing loss, unspecified ear: Secondary | ICD-10-CM | POA: Diagnosis not present

## 2019-06-02 DIAGNOSIS — E1165 Type 2 diabetes mellitus with hyperglycemia: Secondary | ICD-10-CM | POA: Diagnosis not present

## 2019-06-09 DIAGNOSIS — E1165 Type 2 diabetes mellitus with hyperglycemia: Secondary | ICD-10-CM | POA: Diagnosis not present

## 2019-06-09 DIAGNOSIS — H6123 Impacted cerumen, bilateral: Secondary | ICD-10-CM | POA: Diagnosis not present

## 2019-06-09 DIAGNOSIS — I10 Essential (primary) hypertension: Secondary | ICD-10-CM | POA: Diagnosis not present

## 2019-06-09 DIAGNOSIS — R262 Difficulty in walking, not elsewhere classified: Secondary | ICD-10-CM | POA: Diagnosis not present

## 2019-06-09 DIAGNOSIS — J310 Chronic rhinitis: Secondary | ICD-10-CM | POA: Diagnosis not present

## 2019-06-09 DIAGNOSIS — R4701 Aphasia: Secondary | ICD-10-CM | POA: Diagnosis not present

## 2019-06-09 DIAGNOSIS — Z23 Encounter for immunization: Secondary | ICD-10-CM | POA: Diagnosis not present

## 2019-06-09 DIAGNOSIS — E7800 Pure hypercholesterolemia, unspecified: Secondary | ICD-10-CM | POA: Diagnosis not present

## 2019-06-09 DIAGNOSIS — H9193 Unspecified hearing loss, bilateral: Secondary | ICD-10-CM | POA: Diagnosis not present

## 2019-06-27 DIAGNOSIS — L851 Acquired keratosis [keratoderma] palmaris et plantaris: Secondary | ICD-10-CM | POA: Diagnosis not present

## 2019-06-27 DIAGNOSIS — L97511 Non-pressure chronic ulcer of other part of right foot limited to breakdown of skin: Secondary | ICD-10-CM | POA: Diagnosis not present

## 2019-06-27 DIAGNOSIS — B351 Tinea unguium: Secondary | ICD-10-CM | POA: Diagnosis not present

## 2019-06-27 DIAGNOSIS — E119 Type 2 diabetes mellitus without complications: Secondary | ICD-10-CM | POA: Diagnosis not present

## 2019-07-16 DIAGNOSIS — E11621 Type 2 diabetes mellitus with foot ulcer: Secondary | ICD-10-CM | POA: Diagnosis not present

## 2019-07-16 DIAGNOSIS — L97919 Non-pressure chronic ulcer of unspecified part of right lower leg with unspecified severity: Secondary | ICD-10-CM | POA: Diagnosis not present

## 2019-07-16 DIAGNOSIS — L97519 Non-pressure chronic ulcer of other part of right foot with unspecified severity: Secondary | ICD-10-CM | POA: Diagnosis not present

## 2019-07-16 DIAGNOSIS — L97918 Non-pressure chronic ulcer of unspecified part of right lower leg with other specified severity: Secondary | ICD-10-CM | POA: Diagnosis not present

## 2019-07-16 DIAGNOSIS — L308 Other specified dermatitis: Secondary | ICD-10-CM | POA: Diagnosis not present

## 2019-07-16 DIAGNOSIS — I739 Peripheral vascular disease, unspecified: Secondary | ICD-10-CM | POA: Diagnosis not present

## 2019-07-16 DIAGNOSIS — L03115 Cellulitis of right lower limb: Secondary | ICD-10-CM | POA: Diagnosis not present

## 2019-07-16 DIAGNOSIS — E1165 Type 2 diabetes mellitus with hyperglycemia: Secondary | ICD-10-CM | POA: Diagnosis not present

## 2019-07-16 DIAGNOSIS — I10 Essential (primary) hypertension: Secondary | ICD-10-CM | POA: Diagnosis not present

## 2019-07-16 DIAGNOSIS — L03116 Cellulitis of left lower limb: Secondary | ICD-10-CM | POA: Diagnosis not present

## 2019-07-18 DIAGNOSIS — E7800 Pure hypercholesterolemia, unspecified: Secondary | ICD-10-CM | POA: Diagnosis not present

## 2019-07-18 DIAGNOSIS — H9193 Unspecified hearing loss, bilateral: Secondary | ICD-10-CM | POA: Diagnosis not present

## 2019-07-18 DIAGNOSIS — E1165 Type 2 diabetes mellitus with hyperglycemia: Secondary | ICD-10-CM | POA: Diagnosis not present

## 2019-07-18 DIAGNOSIS — I10 Essential (primary) hypertension: Secondary | ICD-10-CM | POA: Diagnosis not present

## 2019-07-18 DIAGNOSIS — R262 Difficulty in walking, not elsewhere classified: Secondary | ICD-10-CM | POA: Diagnosis not present

## 2019-07-19 DIAGNOSIS — I10 Essential (primary) hypertension: Secondary | ICD-10-CM | POA: Diagnosis not present

## 2019-07-19 DIAGNOSIS — E1165 Type 2 diabetes mellitus with hyperglycemia: Secondary | ICD-10-CM | POA: Diagnosis not present

## 2019-07-19 DIAGNOSIS — L03116 Cellulitis of left lower limb: Secondary | ICD-10-CM | POA: Diagnosis not present

## 2019-07-19 DIAGNOSIS — E11621 Type 2 diabetes mellitus with foot ulcer: Secondary | ICD-10-CM | POA: Diagnosis not present

## 2019-07-19 DIAGNOSIS — L03115 Cellulitis of right lower limb: Secondary | ICD-10-CM | POA: Diagnosis not present

## 2019-07-19 DIAGNOSIS — L97519 Non-pressure chronic ulcer of other part of right foot with unspecified severity: Secondary | ICD-10-CM | POA: Diagnosis not present

## 2019-07-19 DIAGNOSIS — I739 Peripheral vascular disease, unspecified: Secondary | ICD-10-CM | POA: Diagnosis not present

## 2019-07-19 DIAGNOSIS — L97918 Non-pressure chronic ulcer of unspecified part of right lower leg with other specified severity: Secondary | ICD-10-CM | POA: Diagnosis not present

## 2019-07-19 DIAGNOSIS — L308 Other specified dermatitis: Secondary | ICD-10-CM | POA: Diagnosis not present

## 2019-07-19 DIAGNOSIS — L97919 Non-pressure chronic ulcer of unspecified part of right lower leg with unspecified severity: Secondary | ICD-10-CM | POA: Diagnosis not present

## 2019-07-21 DIAGNOSIS — L97519 Non-pressure chronic ulcer of other part of right foot with unspecified severity: Secondary | ICD-10-CM | POA: Diagnosis not present

## 2019-07-21 DIAGNOSIS — L03115 Cellulitis of right lower limb: Secondary | ICD-10-CM | POA: Diagnosis not present

## 2019-07-21 DIAGNOSIS — E11621 Type 2 diabetes mellitus with foot ulcer: Secondary | ICD-10-CM | POA: Diagnosis not present

## 2019-07-21 DIAGNOSIS — L97918 Non-pressure chronic ulcer of unspecified part of right lower leg with other specified severity: Secondary | ICD-10-CM | POA: Diagnosis not present

## 2019-07-21 DIAGNOSIS — E1165 Type 2 diabetes mellitus with hyperglycemia: Secondary | ICD-10-CM | POA: Diagnosis not present

## 2019-07-21 DIAGNOSIS — L03116 Cellulitis of left lower limb: Secondary | ICD-10-CM | POA: Diagnosis not present

## 2019-07-21 DIAGNOSIS — L308 Other specified dermatitis: Secondary | ICD-10-CM | POA: Diagnosis not present

## 2019-07-21 DIAGNOSIS — I739 Peripheral vascular disease, unspecified: Secondary | ICD-10-CM | POA: Diagnosis not present

## 2019-08-23 DIAGNOSIS — L308 Other specified dermatitis: Secondary | ICD-10-CM | POA: Diagnosis not present

## 2019-08-23 DIAGNOSIS — R251 Tremor, unspecified: Secondary | ICD-10-CM | POA: Diagnosis not present

## 2019-08-23 DIAGNOSIS — I739 Peripheral vascular disease, unspecified: Secondary | ICD-10-CM | POA: Diagnosis not present

## 2019-08-23 DIAGNOSIS — R296 Repeated falls: Secondary | ICD-10-CM | POA: Diagnosis not present

## 2019-08-23 DIAGNOSIS — R531 Weakness: Secondary | ICD-10-CM | POA: Diagnosis not present

## 2019-08-23 DIAGNOSIS — L03116 Cellulitis of left lower limb: Secondary | ICD-10-CM | POA: Diagnosis not present

## 2019-08-23 DIAGNOSIS — E1165 Type 2 diabetes mellitus with hyperglycemia: Secondary | ICD-10-CM | POA: Diagnosis not present

## 2019-08-23 DIAGNOSIS — I10 Essential (primary) hypertension: Secondary | ICD-10-CM | POA: Diagnosis not present

## 2019-08-23 DIAGNOSIS — R262 Difficulty in walking, not elsewhere classified: Secondary | ICD-10-CM | POA: Diagnosis not present

## 2019-08-23 DIAGNOSIS — L03115 Cellulitis of right lower limb: Secondary | ICD-10-CM | POA: Diagnosis not present

## 2019-09-15 DIAGNOSIS — L98491 Non-pressure chronic ulcer of skin of other sites limited to breakdown of skin: Secondary | ICD-10-CM | POA: Diagnosis not present

## 2019-09-15 DIAGNOSIS — L97519 Non-pressure chronic ulcer of other part of right foot with unspecified severity: Secondary | ICD-10-CM | POA: Diagnosis not present

## 2019-09-15 DIAGNOSIS — L98499 Non-pressure chronic ulcer of skin of other sites with unspecified severity: Secondary | ICD-10-CM | POA: Diagnosis not present

## 2019-09-19 DIAGNOSIS — I10 Essential (primary) hypertension: Secondary | ICD-10-CM | POA: Diagnosis not present

## 2019-09-19 DIAGNOSIS — L308 Other specified dermatitis: Secondary | ICD-10-CM | POA: Diagnosis not present

## 2019-09-19 DIAGNOSIS — R531 Weakness: Secondary | ICD-10-CM | POA: Diagnosis not present

## 2019-09-19 DIAGNOSIS — I739 Peripheral vascular disease, unspecified: Secondary | ICD-10-CM | POA: Diagnosis not present

## 2019-09-19 DIAGNOSIS — R296 Repeated falls: Secondary | ICD-10-CM | POA: Diagnosis not present

## 2019-09-19 DIAGNOSIS — E1165 Type 2 diabetes mellitus with hyperglycemia: Secondary | ICD-10-CM | POA: Diagnosis not present

## 2019-09-19 DIAGNOSIS — R262 Difficulty in walking, not elsewhere classified: Secondary | ICD-10-CM | POA: Diagnosis not present

## 2019-09-19 DIAGNOSIS — R251 Tremor, unspecified: Secondary | ICD-10-CM | POA: Diagnosis not present

## 2019-09-19 DIAGNOSIS — L03115 Cellulitis of right lower limb: Secondary | ICD-10-CM | POA: Diagnosis not present

## 2019-09-19 DIAGNOSIS — L03116 Cellulitis of left lower limb: Secondary | ICD-10-CM | POA: Diagnosis not present

## 2019-09-21 DIAGNOSIS — E1165 Type 2 diabetes mellitus with hyperglycemia: Secondary | ICD-10-CM | POA: Diagnosis not present

## 2019-09-21 DIAGNOSIS — R262 Difficulty in walking, not elsewhere classified: Secondary | ICD-10-CM | POA: Diagnosis not present

## 2019-09-21 DIAGNOSIS — L03115 Cellulitis of right lower limb: Secondary | ICD-10-CM | POA: Diagnosis not present

## 2019-09-21 DIAGNOSIS — I739 Peripheral vascular disease, unspecified: Secondary | ICD-10-CM | POA: Diagnosis not present

## 2019-09-21 DIAGNOSIS — L03116 Cellulitis of left lower limb: Secondary | ICD-10-CM | POA: Diagnosis not present

## 2019-09-21 DIAGNOSIS — L308 Other specified dermatitis: Secondary | ICD-10-CM | POA: Diagnosis not present

## 2019-09-21 DIAGNOSIS — I10 Essential (primary) hypertension: Secondary | ICD-10-CM | POA: Diagnosis not present

## 2019-10-20 DIAGNOSIS — I10 Essential (primary) hypertension: Secondary | ICD-10-CM | POA: Diagnosis not present

## 2019-10-20 DIAGNOSIS — R251 Tremor, unspecified: Secondary | ICD-10-CM | POA: Diagnosis not present

## 2019-10-20 DIAGNOSIS — L308 Other specified dermatitis: Secondary | ICD-10-CM | POA: Diagnosis not present

## 2019-10-20 DIAGNOSIS — R296 Repeated falls: Secondary | ICD-10-CM | POA: Diagnosis not present

## 2019-10-20 DIAGNOSIS — E1165 Type 2 diabetes mellitus with hyperglycemia: Secondary | ICD-10-CM | POA: Diagnosis not present

## 2019-10-20 DIAGNOSIS — R531 Weakness: Secondary | ICD-10-CM | POA: Diagnosis not present

## 2019-10-20 DIAGNOSIS — L03115 Cellulitis of right lower limb: Secondary | ICD-10-CM | POA: Diagnosis not present

## 2019-10-20 DIAGNOSIS — I739 Peripheral vascular disease, unspecified: Secondary | ICD-10-CM | POA: Diagnosis not present

## 2019-10-20 DIAGNOSIS — R262 Difficulty in walking, not elsewhere classified: Secondary | ICD-10-CM | POA: Diagnosis not present

## 2019-10-20 DIAGNOSIS — L03116 Cellulitis of left lower limb: Secondary | ICD-10-CM | POA: Diagnosis not present

## 2019-10-27 DIAGNOSIS — Z872 Personal history of diseases of the skin and subcutaneous tissue: Secondary | ICD-10-CM | POA: Diagnosis not present

## 2019-10-27 DIAGNOSIS — L820 Inflamed seborrheic keratosis: Secondary | ICD-10-CM | POA: Diagnosis not present

## 2019-10-27 DIAGNOSIS — L97528 Non-pressure chronic ulcer of other part of left foot with other specified severity: Secondary | ICD-10-CM | POA: Diagnosis not present

## 2019-10-27 DIAGNOSIS — L821 Other seborrheic keratosis: Secondary | ICD-10-CM | POA: Diagnosis not present

## 2019-10-27 DIAGNOSIS — L219 Seborrheic dermatitis, unspecified: Secondary | ICD-10-CM | POA: Diagnosis not present

## 2019-10-27 DIAGNOSIS — E13621 Other specified diabetes mellitus with foot ulcer: Secondary | ICD-10-CM | POA: Diagnosis not present

## 2019-10-27 DIAGNOSIS — L570 Actinic keratosis: Secondary | ICD-10-CM | POA: Diagnosis not present

## 2019-11-08 DIAGNOSIS — L851 Acquired keratosis [keratoderma] palmaris et plantaris: Secondary | ICD-10-CM | POA: Diagnosis not present

## 2019-11-08 DIAGNOSIS — E119 Type 2 diabetes mellitus without complications: Secondary | ICD-10-CM | POA: Diagnosis not present

## 2019-11-08 DIAGNOSIS — L97511 Non-pressure chronic ulcer of other part of right foot limited to breakdown of skin: Secondary | ICD-10-CM | POA: Diagnosis not present

## 2019-11-08 DIAGNOSIS — B351 Tinea unguium: Secondary | ICD-10-CM | POA: Diagnosis not present

## 2019-11-18 ENCOUNTER — Inpatient Hospital Stay
Admission: EM | Admit: 2019-11-18 | Discharge: 2019-11-18 | DRG: 446 | Disposition: A | Discharge status: Other Acute Inpatient Hospital | Payer: PPO | Attending: Internal Medicine | Admitting: Internal Medicine

## 2019-11-18 ENCOUNTER — Inpatient Hospital Stay (HOSPITAL_COMMUNITY)
Admission: AD | Admit: 2019-11-18 | Discharge: 2019-11-26 | DRG: 871 | Disposition: A | Discharge status: Skilled Nursing Facility | Payer: PPO | Source: Other Acute Inpatient Hospital | Attending: Internal Medicine | Admitting: Internal Medicine

## 2019-11-18 ENCOUNTER — Encounter: Payer: Self-pay | Admitting: Emergency Medicine

## 2019-11-18 ENCOUNTER — Emergency Department: Payer: PPO

## 2019-11-18 ENCOUNTER — Other Ambulatory Visit: Payer: Self-pay

## 2019-11-18 DIAGNOSIS — S0990XA Unspecified injury of head, initial encounter: Secondary | ICD-10-CM | POA: Diagnosis not present

## 2019-11-18 DIAGNOSIS — I4891 Unspecified atrial fibrillation: Secondary | ICD-10-CM | POA: Diagnosis present

## 2019-11-18 DIAGNOSIS — D72829 Elevated white blood cell count, unspecified: Secondary | ICD-10-CM

## 2019-11-18 DIAGNOSIS — K8020 Calculus of gallbladder without cholecystitis without obstruction: Secondary | ICD-10-CM | POA: Diagnosis not present

## 2019-11-18 DIAGNOSIS — F0390 Unspecified dementia without behavioral disturbance: Secondary | ICD-10-CM | POA: Diagnosis present

## 2019-11-18 DIAGNOSIS — I5033 Acute on chronic diastolic (congestive) heart failure: Secondary | ICD-10-CM | POA: Diagnosis not present

## 2019-11-18 DIAGNOSIS — R791 Abnormal coagulation profile: Secondary | ICD-10-CM | POA: Diagnosis present

## 2019-11-18 DIAGNOSIS — I350 Nonrheumatic aortic (valve) stenosis: Secondary | ICD-10-CM | POA: Diagnosis not present

## 2019-11-18 DIAGNOSIS — R778 Other specified abnormalities of plasma proteins: Secondary | ICD-10-CM | POA: Diagnosis present

## 2019-11-18 DIAGNOSIS — E079 Disorder of thyroid, unspecified: Secondary | ICD-10-CM | POA: Diagnosis not present

## 2019-11-18 DIAGNOSIS — Z96651 Presence of right artificial knee joint: Secondary | ICD-10-CM | POA: Diagnosis present

## 2019-11-18 DIAGNOSIS — I739 Peripheral vascular disease, unspecified: Secondary | ICD-10-CM | POA: Diagnosis not present

## 2019-11-18 DIAGNOSIS — R6521 Severe sepsis with septic shock: Secondary | ICD-10-CM | POA: Diagnosis present

## 2019-11-18 DIAGNOSIS — E785 Hyperlipidemia, unspecified: Secondary | ICD-10-CM | POA: Diagnosis not present

## 2019-11-18 DIAGNOSIS — Z6833 Body mass index (BMI) 33.0-33.9, adult: Secondary | ICD-10-CM

## 2019-11-18 DIAGNOSIS — N179 Acute kidney failure, unspecified: Secondary | ICD-10-CM | POA: Diagnosis not present

## 2019-11-18 DIAGNOSIS — K8050 Calculus of bile duct without cholangitis or cholecystitis without obstruction: Secondary | ICD-10-CM

## 2019-11-18 DIAGNOSIS — Z8249 Family history of ischemic heart disease and other diseases of the circulatory system: Secondary | ICD-10-CM

## 2019-11-18 DIAGNOSIS — A419 Sepsis, unspecified organism: Principal | ICD-10-CM | POA: Diagnosis present

## 2019-11-18 DIAGNOSIS — R062 Wheezing: Secondary | ICD-10-CM | POA: Diagnosis not present

## 2019-11-18 DIAGNOSIS — L03115 Cellulitis of right lower limb: Secondary | ICD-10-CM | POA: Diagnosis not present

## 2019-11-18 DIAGNOSIS — S3991XA Unspecified injury of abdomen, initial encounter: Secondary | ICD-10-CM | POA: Diagnosis not present

## 2019-11-18 DIAGNOSIS — K8070 Calculus of gallbladder and bile duct without cholecystitis without obstruction: Secondary | ICD-10-CM | POA: Diagnosis not present

## 2019-11-18 DIAGNOSIS — K8309 Other cholangitis: Secondary | ICD-10-CM | POA: Diagnosis present

## 2019-11-18 DIAGNOSIS — I110 Hypertensive heart disease with heart failure: Secondary | ICD-10-CM | POA: Diagnosis not present

## 2019-11-18 DIAGNOSIS — E039 Hypothyroidism, unspecified: Secondary | ICD-10-CM | POA: Diagnosis not present

## 2019-11-18 DIAGNOSIS — R296 Repeated falls: Secondary | ICD-10-CM | POA: Diagnosis not present

## 2019-11-18 DIAGNOSIS — Z87891 Personal history of nicotine dependence: Secondary | ICD-10-CM | POA: Diagnosis not present

## 2019-11-18 DIAGNOSIS — R6520 Severe sepsis without septic shock: Secondary | ICD-10-CM | POA: Diagnosis not present

## 2019-11-18 DIAGNOSIS — H9190 Unspecified hearing loss, unspecified ear: Secondary | ICD-10-CM | POA: Diagnosis present

## 2019-11-18 DIAGNOSIS — R262 Difficulty in walking, not elsewhere classified: Secondary | ICD-10-CM | POA: Diagnosis not present

## 2019-11-18 DIAGNOSIS — Z881 Allergy status to other antibiotic agents status: Secondary | ICD-10-CM

## 2019-11-18 DIAGNOSIS — K8071 Calculus of gallbladder and bile duct without cholecystitis with obstruction: Secondary | ICD-10-CM | POA: Diagnosis not present

## 2019-11-18 DIAGNOSIS — Z8673 Personal history of transient ischemic attack (TIA), and cerebral infarction without residual deficits: Secondary | ICD-10-CM

## 2019-11-18 DIAGNOSIS — Z79899 Other long term (current) drug therapy: Secondary | ICD-10-CM

## 2019-11-18 DIAGNOSIS — Z820 Family history of epilepsy and other diseases of the nervous system: Secondary | ICD-10-CM

## 2019-11-18 DIAGNOSIS — E1165 Type 2 diabetes mellitus with hyperglycemia: Secondary | ICD-10-CM | POA: Diagnosis not present

## 2019-11-18 DIAGNOSIS — E1151 Type 2 diabetes mellitus with diabetic peripheral angiopathy without gangrene: Secondary | ICD-10-CM | POA: Diagnosis present

## 2019-11-18 DIAGNOSIS — Z91048 Other nonmedicinal substance allergy status: Secondary | ICD-10-CM

## 2019-11-18 DIAGNOSIS — M5489 Other dorsalgia: Secondary | ICD-10-CM | POA: Diagnosis not present

## 2019-11-18 DIAGNOSIS — R52 Pain, unspecified: Secondary | ICD-10-CM | POA: Diagnosis not present

## 2019-11-18 DIAGNOSIS — I10 Essential (primary) hypertension: Secondary | ICD-10-CM | POA: Diagnosis present

## 2019-11-18 DIAGNOSIS — Z79890 Hormone replacement therapy (postmenopausal): Secondary | ICD-10-CM | POA: Diagnosis not present

## 2019-11-18 DIAGNOSIS — J441 Chronic obstructive pulmonary disease with (acute) exacerbation: Secondary | ICD-10-CM

## 2019-11-18 DIAGNOSIS — R748 Abnormal levels of other serum enzymes: Secondary | ICD-10-CM | POA: Diagnosis not present

## 2019-11-18 DIAGNOSIS — Z8551 Personal history of malignant neoplasm of bladder: Secondary | ICD-10-CM | POA: Diagnosis not present

## 2019-11-18 DIAGNOSIS — Z20822 Contact with and (suspected) exposure to covid-19: Secondary | ICD-10-CM | POA: Diagnosis present

## 2019-11-18 DIAGNOSIS — I341 Nonrheumatic mitral (valve) prolapse: Secondary | ICD-10-CM | POA: Diagnosis not present

## 2019-11-18 DIAGNOSIS — W1830XA Fall on same level, unspecified, initial encounter: Secondary | ICD-10-CM | POA: Diagnosis present

## 2019-11-18 DIAGNOSIS — R0989 Other specified symptoms and signs involving the circulatory and respiratory systems: Secondary | ICD-10-CM | POA: Diagnosis not present

## 2019-11-18 DIAGNOSIS — I5031 Acute diastolic (congestive) heart failure: Secondary | ICD-10-CM | POA: Diagnosis not present

## 2019-11-18 DIAGNOSIS — N400 Enlarged prostate without lower urinary tract symptoms: Secondary | ICD-10-CM

## 2019-11-18 DIAGNOSIS — R410 Disorientation, unspecified: Secondary | ICD-10-CM | POA: Diagnosis not present

## 2019-11-18 DIAGNOSIS — L03116 Cellulitis of left lower limb: Secondary | ICD-10-CM | POA: Diagnosis not present

## 2019-11-18 DIAGNOSIS — R1084 Generalized abdominal pain: Secondary | ICD-10-CM | POA: Diagnosis present

## 2019-11-18 DIAGNOSIS — W19XXXA Unspecified fall, initial encounter: Secondary | ICD-10-CM

## 2019-11-18 DIAGNOSIS — J449 Chronic obstructive pulmonary disease, unspecified: Secondary | ICD-10-CM | POA: Diagnosis not present

## 2019-11-18 DIAGNOSIS — Z66 Do not resuscitate: Secondary | ICD-10-CM | POA: Diagnosis present

## 2019-11-18 DIAGNOSIS — K8051 Calculus of bile duct without cholangitis or cholecystitis with obstruction: Secondary | ICD-10-CM | POA: Diagnosis not present

## 2019-11-18 DIAGNOSIS — L308 Other specified dermatitis: Secondary | ICD-10-CM | POA: Diagnosis not present

## 2019-11-18 DIAGNOSIS — R0602 Shortness of breath: Secondary | ICD-10-CM | POA: Diagnosis not present

## 2019-11-18 DIAGNOSIS — Z743 Need for continuous supervision: Secondary | ICD-10-CM | POA: Diagnosis not present

## 2019-11-18 DIAGNOSIS — K838 Other specified diseases of biliary tract: Secondary | ICD-10-CM | POA: Diagnosis not present

## 2019-11-18 DIAGNOSIS — R17 Unspecified jaundice: Secondary | ICD-10-CM

## 2019-11-18 DIAGNOSIS — R279 Unspecified lack of coordination: Secondary | ICD-10-CM | POA: Diagnosis not present

## 2019-11-18 DIAGNOSIS — R0902 Hypoxemia: Secondary | ICD-10-CM | POA: Diagnosis not present

## 2019-11-18 DIAGNOSIS — S199XXA Unspecified injury of neck, initial encounter: Secondary | ICD-10-CM | POA: Diagnosis not present

## 2019-11-18 DIAGNOSIS — N281 Cyst of kidney, acquired: Secondary | ICD-10-CM | POA: Diagnosis not present

## 2019-11-18 DIAGNOSIS — E669 Obesity, unspecified: Secondary | ICD-10-CM | POA: Diagnosis not present

## 2019-11-18 DIAGNOSIS — W19XXXD Unspecified fall, subsequent encounter: Secondary | ICD-10-CM | POA: Diagnosis not present

## 2019-11-18 DIAGNOSIS — R109 Unspecified abdominal pain: Secondary | ICD-10-CM

## 2019-11-18 DIAGNOSIS — J45909 Unspecified asthma, uncomplicated: Secondary | ICD-10-CM | POA: Diagnosis not present

## 2019-11-18 LAB — LACTIC ACID, PLASMA
Lactic Acid, Venous: 3.3 mmol/L (ref 0.5–1.9)
Lactic Acid, Venous: 2.2 mmol/L (ref 0.5–1.9)

## 2019-11-18 LAB — RESPIRATORY PANEL BY RT PCR (FLU A&B, COVID)
Influenza A by PCR: NEGATIVE
Influenza B by PCR: NEGATIVE
SARS Coronavirus 2 by RT PCR: NEGATIVE

## 2019-11-18 LAB — URINALYSIS, COMPLETE (UACMP) WITH MICROSCOPIC
Bacteria, UA: NONE SEEN
Bilirubin Urine: NEGATIVE
Glucose, UA: >=500 mg/dL — AB
Hgb urine dipstick: NEGATIVE
Ketones, ur: NEGATIVE mg/dL
Leukocytes,Ua: NEGATIVE
Nitrite: NEGATIVE
Protein, ur: NEGATIVE mg/dL
Specific Gravity, Urine: 1.015 (ref 1.005–1.030)
Squamous Epithelial / LPF: NONE SEEN (ref 0–5)
pH: 6 (ref 5.0–8.0)

## 2019-11-18 LAB — COMPREHENSIVE METABOLIC PANEL
ALT: 174 U/L — ABNORMAL HIGH (ref 0–44)
AST: 192 U/L — ABNORMAL HIGH (ref 15–41)
Albumin: 3.9 g/dL (ref 3.5–5.0)
Alkaline Phosphatase: 109 U/L (ref 38–126)
Anion gap: 14 (ref 5–15)
BUN: 28 mg/dL — ABNORMAL HIGH (ref 8–23)
CO2: 23 mmol/L (ref 22–32)
Calcium: 9.1 mg/dL (ref 8.9–10.3)
Chloride: 97 mmol/L — ABNORMAL LOW (ref 98–111)
Creatinine, Ser: 1.34 mg/dL — ABNORMAL HIGH (ref 0.61–1.24)
GFR calc Af Amer: 53 mL/min — ABNORMAL LOW (ref >60)
GFR calc non Af Amer: 46 mL/min — ABNORMAL LOW (ref >60)
Glucose, Bld: 386 mg/dL — ABNORMAL HIGH (ref 70–99)
Potassium: 4.5 mmol/L (ref 3.5–5.1)
Sodium: 134 mmol/L — ABNORMAL LOW (ref 135–145)
Total Bilirubin: 4.6 mg/dL — ABNORMAL HIGH (ref 0.3–1.2)
Total Protein: 8.3 g/dL — ABNORMAL HIGH (ref 6.5–8.1)

## 2019-11-18 LAB — TROPONIN I (HIGH SENSITIVITY)
Troponin I (High Sensitivity): 21 ng/L — ABNORMAL HIGH (ref <18)
Troponin I (High Sensitivity): 22 ng/L — ABNORMAL HIGH (ref <18)

## 2019-11-18 LAB — CBC WITH DIFFERENTIAL/PLATELET
Abs Immature Granulocytes: 0.13 10*3/uL — ABNORMAL HIGH (ref 0.00–0.07)
Basophils Absolute: 0 10*3/uL (ref 0.0–0.1)
Basophils Relative: 0 %
Eosinophils Absolute: 0 10*3/uL (ref 0.0–0.5)
Eosinophils Relative: 0 %
HCT: 45.4 % (ref 39.0–52.0)
Hemoglobin: 14.6 g/dL (ref 13.0–17.0)
Immature Granulocytes: 1 %
Lymphocytes Relative: 3 %
Lymphs Abs: 0.7 10*3/uL (ref 0.7–4.0)
MCH: 28.9 pg (ref 26.0–34.0)
MCHC: 32.2 g/dL (ref 30.0–36.0)
MCV: 89.9 fL (ref 80.0–100.0)
Monocytes Absolute: 0.9 10*3/uL (ref 0.1–1.0)
Monocytes Relative: 4 %
Neutro Abs: 21.7 10*3/uL — ABNORMAL HIGH (ref 1.7–7.7)
Neutrophils Relative %: 92 %
Platelets: 175 10*3/uL (ref 150–400)
RBC: 5.05 MIL/uL (ref 4.22–5.81)
RDW: 14.1 % (ref 11.5–15.5)
WBC: 23.5 10*3/uL — ABNORMAL HIGH (ref 4.0–10.5)
nRBC: 0 % (ref 0.0–0.2)

## 2019-11-18 LAB — LIPASE, BLOOD: Lipase: 40 U/L (ref 11–51)

## 2019-11-18 MED ORDER — SODIUM CHLORIDE 0.9 % IV BOLUS
500.0000 mL | Freq: Once | INTRAVENOUS | Status: AC
Start: 2019-11-18 — End: 2019-11-18
  Administered 2019-11-18: 13:00:00 500 mL via INTRAVENOUS

## 2019-11-18 MED ORDER — ONDANSETRON HCL 4 MG/2ML IJ SOLN
4.0000 mg | Freq: Once | INTRAMUSCULAR | Status: AC
  Administered 2019-11-18: 4 mg via INTRAVENOUS
  Filled 2019-11-18: qty 2

## 2019-11-18 MED ORDER — IOHEXOL 350 MG/ML SOLN
75.0000 mL | Freq: Once | INTRAVENOUS | Status: AC | PRN
  Administered 2019-11-18: 75 mL via INTRAVENOUS

## 2019-11-18 MED ORDER — GADOBUTROL 1 MMOL/ML IV SOLN
10.0000 mL | Freq: Once | INTRAVENOUS | Status: AC | PRN
  Administered 2019-11-18: 10 mL via INTRAVENOUS

## 2019-11-18 MED ORDER — HYDROMORPHONE HCL 1 MG/ML IJ SOLN
0.5000 mg | Freq: Once | INTRAMUSCULAR | Status: AC
  Administered 2019-11-18: 0.5 mg via INTRAVENOUS
  Filled 2019-11-18: qty 1

## 2019-11-18 MED ORDER — HYDROMORPHONE HCL 1 MG/ML IJ SOLN
0.5000 mg | Freq: Once | INTRAMUSCULAR | Status: AC
  Administered 2019-11-18: 18:00:00 0.5 mg via INTRAVENOUS
  Filled 2019-11-18: qty 1

## 2019-11-18 MED ORDER — PIPERACILLIN-TAZOBACTAM 3.375 G IVPB 30 MIN
3.3750 g | Freq: Once | INTRAVENOUS | Status: AC
  Administered 2019-11-18: 16:00:00 3.375 g via INTRAVENOUS
  Filled 2019-11-18: qty 50

## 2019-11-18 MED ORDER — SODIUM CHLORIDE 0.9 % IV BOLUS
500.0000 mL | Freq: Once | INTRAVENOUS | Status: AC
  Administered 2019-11-18: 18:00:00 500 mL via INTRAVENOUS

## 2019-11-18 NOTE — ED Notes (Signed)
Pt changed by this RN. Pt was covered in substantial amount of urine that had a foul odor. This nurse removed the patient's soiled pants and changed the sheets/brief. Pt tolerated rolling fairly well. Pt in clean brief with clean chux now, with male urine suction bag attached. Warm blankets given, no further complaints at this time. Will continue to monitor.

## 2019-11-18 NOTE — ED Provider Notes (Signed)
-----------------------------------------   7:43 PM on 11/18/2019 -----------------------------------------  I took over care on this patient from Dr. Jari Pigg.  The patient has abdominal pain and findings concerning for possible choledocholithiasis and was awaiting an MRCP.  MRCP was motion limited but showed a dilated common bile duct and findings concerning for sludge versus possible stone.  I discussed this with Dr. Vicente Males from gastroenterology who advised that the patient should get an ERCP and recommended that the patient be transferred to [facility] or another facility that can provide ERCP at this time.  I contacted [facility] and discussed the case with Dr. Arelia Longest from gastroenterology at [facility] who agreed with the patient being transferred so that ERCP could be performed.  I then discussed the case with the hospitalist at [facility], Dr. Doristine Bosworth, who accepted the patient for transfer.  I discussed the plan with the patient and his wife who agreed.  The patient's vital signs remain stable.  His lactate has improved with fluids.  Antibiotics have been given.  He is stable for transfer.  I have discussed his case with the oncoming ED physician Dr. Jimmye Norman.   [name], MD 11/18/19 1945

## 2019-11-18 NOTE — H&P (Signed)
History and Physical  David Morgan [id number] DOB: 1925/06/09 DOA: 11/18/2019  Referring physician: [name] MD PCP: [name], MD  Patient coming from: [facility]   Chief Complaint: Fall and abdominal pain  HPI: David Morgan is a 86 y.o. male with medical history significant for hypertension, hyperlipidemia, type 2 diabetes mellitus, hypothyroidism, BPH and dementia who presents to [facility] ED due to mechanical fall.  History cannot be obtained from patient possibly due to underlying dementia, history was obtained from ED chart.  Per report, patient had 2 consecutive falls during which he hit his head on a second fall without losing consciousness, but complaining of severe, constant abdominal pain with no alleviating/aggravating factors.   ED Course: He was noted to be tachypneic, BP was elevated at 164/76 and other vital signs are within normal range.  Work-up in the ED showed leukocytosis, lactic acid 3.3>2.2, hyperglycemia, BUN/creatinine 20/1.34 (baseline creatinine 0.6-0.8), did liver enzymes, total bilirubin 4.6.  Urinalysis was positive for glycosuria.  Respiratory panel for influenza, RSV and COVID-19 virus was negative.  MRCP was done and showed abnormal signal in the 7 mm diameter distal common bile duct with loss of normal T2 hyperintensity.  This is not well evaluated due to motion artifact and could represent sludge or static gallstones.  No suspicious enhancement to suggest soft tissue mass but follow-up likely warranted.  Cholelithiasis was noted.  CT of head and CT cervical spine without contrast showed no acute abnormality and no acute cervical spine fracture noted.  General surgery was consulted and ED visit was referred to a gastroenterologist who recommended MRCP with results as described above, it was then recommended for patient to be transferred to [facility] for ERCP.  1 dose of IV Zosyn was given due to elevated WBC.  Review of  Systems: Constitutional: Negative for chills and fever.  HENT: Negative for ear pain and sore throat.   Eyes: Negative for pain and visual disturbance.  Respiratory: Shortness of breath negative for cough, chest tightness  Cardiovascular: Negative for chest pain and palpitations.  Gastrointestinal: Abdominal pain.  Negative for vomiting.  Endocrine: Negative for polyphagia and polyuria.  Genitourinary: Negative for decreased urine volume, dysuria Musculoskeletal: Negative for arthralgias and back pain.  Skin: Negative for color change and rash.  Allergic/Immunologic: Negative for immunocompromised state.  Neurological: Negative for tremors, syncope, speech difficulty, weakness, light-headedness and headaches.  Hematological: Does not bruise/bleed easily.    Past Medical History:  Diagnosis Date  . Arthritis   . Cancer ([place])   . Claudication ([place])   . Diverticulitis   . Emphysema/COPD ([place])   . History of bladder cancer   . History of carotid artery stenosis   . History of TIA (transient ischemic attack)    2012--  NO RESIDUAL (PER SCAN HAD A PREVIOUS TIA BEFORE 2012)  . Hyperlipidemia   . Hypertension   . Hypothyroidism   . Lesion of bladder   . Mild asthma    NO INHALER  . Nocturia   . Peripheral vascular disease ([place])   . Short of breath on exertion   . Urgency of urination   . Wears glasses    Past Surgical History:  Procedure Laterality Date  . CAROTID ENDARTERECTOMY Right 2005  . CARPAL TUNNEL RELEASE Bilateral 2002  &  2007  . CATARACT EXTRACTION W/ INTRAOCULAR LENS  IMPLANT, BILATERAL    . CYSTOSCOPY W/ RETROGRADES Bilateral 12/26/2013   Procedure: BILATERAL RETROGRADE PYELOGRAM;  Surgeon: Claybon Jabs, MD;  Location:  [place];  Service: Urology;  Laterality: Bilateral;  . CYSTOSCOPY WITH BIOPSY N/A 12/26/2013   Procedure: CYSTOSCOPY WITH BLADDER BIOPSY;  Surgeon: Claybon Jabs, MD;  Location: [facility];  Service: Urology;   Laterality: N/A;  . INGUINAL HERNIA REPAIR  YRS AGO  . LAPAROSCOPIC LYSIS OF ADHESIONS  06/28/2015   Procedure: LAPAROSCOPIC LYSIS OF ADHESIONS;  Surgeon: Clayburn Pert, MD;  Location: [facility];  Service: General;;  . LAPAROSCOPY  06/28/2015   Procedure: LAPAROSCOPY DIAGNOSTIC;  Surgeon: Clayburn Pert, MD;  Location: [facility];  Service: General;;  . LAPAROTOMY N/A 09/03/2015   Procedure: umbilical hernia repair with mesh;  Surgeon: Hubbard Robinson, MD;  Location: [facility];  Service: General;  Laterality: N/A;  . ORIF HIP FRACTURE Left 2008   RETAINED HARDWARE  . RIGHT SHOULDER  SURGERY  2005  . TOTAL KNEE ARTHROPLASTY Right 2004  . TRANSURETHRAL RESECTION OF BLADDER TUMOR  1990  . UMBILICAL HERNIA REPAIR   2009  &  2011    Social History:  reports that he quit smoking about 25 years ago. His smoking use included cigarettes. He has a 40.00 pack-year smoking history. He has never used smokeless tobacco. He reports that he does not drink alcohol or use drugs.   Allergies  Allergen Reactions  . Adhesive [Tape] Rash  . Augmentin [Amoxicillin-Pot Clavulanate] Itching, Rash and Other (See Comments)    Has patient had a PCN reaction causing immediate rash, facial/tongue/throat swelling, SOB or lightheadedness with hypotension: No Has patient had a PCN reaction causing severe rash involving mucus membranes or skin necrosis: No Has patient had a PCN reaction that required hospitalization No Has patient had a PCN reaction occurring within the last 10 years: No If all of the above answers are "NO", then may proceed with Cephalosporin use.     Family History  Problem Relation Age of Onset  . Hypertension Father   . Alzheimer's disease Mother      Prior to Admission medications   Medication Sig Start Date End Date Taking? Authorizing Provider  acetaminophen (TYLENOL) 500 MG tablet Take 2 tablets (1,000 mg total) by mouth every 6 (six) hours as needed. 09/21/15   Hubbard Robinson, MD   carvedilol (COREG) 3.125 MG tablet Take 3.125 mg by mouth 2 (two) times daily with a meal.    [provider]  donepezil (ARICEPT) 5 MG tablet Take 5 mg by mouth daily. 09/30/19   [provider]  hydrochlorothiazide (HYDRODIURIL) 12.5 MG tablet Take 12.5 mg by mouth daily. 09/16/19   [provider]  Hydrocodone-Acetaminophen (VICODIN) 5-300 MG TABS Take 1 tablet by mouth every 4 (four) hours as needed. Patient not taking: Reported on 12/21/2017 09/21/15   Hubbard Robinson, MD  ipratropium (ATROVENT) 0.03 % nasal spray Place 2 sprays into both nostrils 2 (two) times daily. 09/15/19   [provider]  ketoconazole (NIZORAL) 2 % cream Apply  a small amount to affected area once a day  to scalp 10/27/19   [provider]  levothyroxine (SYNTHROID, LEVOTHROID) 112 MCG tablet Take 112 mcg by mouth daily before breakfast.    [provider]  losartan (COZAAR) 50 MG tablet Take 50 mg by mouth daily.  06/26/15   [provider]  metFORMIN (GLUCOPHAGE) 500 MG tablet Take 500 mg by mouth daily. 09/16/19   [provider]  Multiple Vitamin (MULTI-VITAMINS) TABS Take 1 tablet by mouth daily.    [provider]  mupirocin ointment (BACTROBAN) 2 % SMARTSIG:1 Application Topical 2-3 Times Daily 09/20/19   [provider]  ondansetron (ZOFRAN) 4 MG tablet Take 1 tablet (4 mg total) by mouth every 6 (six) hours as needed for nausea. Patient not taking: Reported on 12/21/2017 08/10/15   Sherri Rad, MD  tamsulosin (FLOMAX) 0.4 MG CAPS capsule Take 0.4 mg by mouth every evening.     [provider]    Physical Exam: BP 119/61 (BP Location: Left Arm)   Pulse 94   Temp 97.8 F (36.6 C) (Oral)   Resp 18   Ht 5\' 9"  (1.753 m)   Wt 104 kg   SpO2 97%   BMI 33.86 kg/m   . General: 86 y.o. year-old male well developed well nourished in no acute distress.  Alert and oriented x3. [name] HEENT: NCAT, PERRLA, hard of  hearing . Cardiovascular: Regular rate and rhythm with no rubs or gallops.  No thyromegaly or JVD noted.  No lower extremity edema. 2/4 pulses in all 4 extremities. [name] Respiratory: Clear to auscultation with no wheezes or rales.  . Abdomen: Abdomen distended and diffused tenderness. Normal bowel sounds x4 quadrants. . Muskuloskeletal: No cyanosis, clubbing or edema noted bilaterally . Neuro: CN II-XII intact, strength, sensation, reflexes . Skin: No ulcerative lesions noted or rashes . Psychiatry: Judgement and insight appear normal. Mood is appropriate for condition and setting          Labs on Admission:  Basic Metabolic Panel: Recent Labs  Lab 11/18/19 1138  NA 134*  K 4.5  CL 97*  CO2 23  GLUCOSE 386*  BUN 28*  CREATININE 1.34*  CALCIUM 9.1   Liver Function Tests: Recent Labs  Lab 11/18/19 1138  AST 192*  ALT 174*  ALKPHOS 109  BILITOT 4.6*  PROT 8.3*  ALBUMIN 3.9   Recent Labs  Lab 11/18/19 1138  LIPASE 40   No results for input(s): AMMONIA in the last 168 hours. CBC: Recent Labs  Lab 11/18/19 1138  WBC 23.5*  NEUTROABS 21.7*  HGB 14.6  HCT 45.4  MCV 89.9  PLT 175   Cardiac Enzymes: No results for input(s): CKTOTAL, CKMB, CKMBINDEX, TROPONINI in the last 168 hours.  BNP (last 3 results) No results for input(s): BNP in the last 8760 hours.  ProBNP (last 3 results) No results for input(s): PROBNP in the last 8760 hours.  CBG: No results for input(s): GLUCAP in the last 168 hours.  Radiological Exams on Admission: CT Head Wo Contrast  Result Date: 11/18/2019 CLINICAL DATA:  [place] at home yesterday. [place] again this morning. Confusion. EXAM: CT HEAD WITHOUT CONTRAST CT CERVICAL SPINE WITHOUT CONTRAST TECHNIQUE: Multidetector CT imaging of the head and cervical spine was performed following the standard protocol without intravenous contrast. Multiplanar CT image reconstructions of the cervical spine were also generated. COMPARISON:  Head CT 12/21/2017  FINDINGS: CT HEAD FINDINGS Brain: Stable age related significant cerebral atrophy, ventriculomegaly and periventricular white matter disease. No extra-axial fluid collections are identified. No CT findings for acute hemispheric infarction or intracranial hemorrhage. No mass lesions. The brainstem and cerebellum are normal. Vascular: Stable vascular calcifications. No hyperdense vessels or obvious aneurysm. Skull: No skull fracture or bone lesions. Sinuses/Orbits: The paranasal sinuses and mastoid air cells are clear. The globes are intact. Other: No scalp lesions or obvious laceration or foreign body. CT CERVICAL SPINE FINDINGS Alignment: Normal overall alignment. Mild degenerative anterior subluxation of C7 compared to T1. The facet joints are fused at this disc  space level. Skull base and vertebrae: No acute fracture. No primary bone lesion or focal pathologic process. Soft tissues and spinal canal: No prevertebral fluid or swelling. No visible canal hematoma. Disc levels: The spinal canal is fairly generous. No significant spinal stenosis. Multilevel facet disease and mild uncinate spurring changes contributing to mild multilevel bony foraminal narrowing. Upper chest: The lung apices are grossly clear. Other: No neck mass or adenopathy. Carotid artery calcifications are noted. IMPRESSION: 1. Stable age related cerebral atrophy, ventriculomegaly and periventricular white matter disease. 2. No acute intracranial findings or skull fracture. 3. Degenerative cervical spondylosis with multilevel disc disease, facet disease and uncinate spurring changes but no acute cervical spine fracture. Electronically Signed   By: Marijo Sanes M.D.   On: 11/18/2019 13:19   CT Angio Chest PE W and/or Wo Contrast  Result Date: 11/18/2019 CLINICAL DATA:  86 year old male with abdominal trauma EXAM: CT ANGIOGRAPHY CHEST CT ABDOMEN AND PELVIS WITH CONTRAST TECHNIQUE: Multidetector CT imaging of the chest was performed using the  standard protocol during bolus administration of intravenous contrast. Multiplanar CT image reconstructions and MIPs were obtained to evaluate the vascular anatomy. Multidetector CT imaging of the abdomen and pelvis was performed using the standard protocol during bolus administration of intravenous contrast. CONTRAST:  90mL OMNIPAQUE IOHEXOL 350 MG/ML SOLN COMPARISON:  09/18/2015 FINDINGS: CTA CHEST FINDINGS Cardiovascular: Heart: No cardiomegaly. No pericardial fluid/thickening. Calcifications of left main, left anterior descending, circumflex, right coronary arteries. Aorta: Calcifications of the aortic valve. Unremarkable course caliber and contour of the thoracic aorta. Moderate atherosclerosis of the aorta including some irregular intimal plaque of the sending thoracic aorta. No ulcerated plaque or evidence of dissection. Pulmonary arteries: No central, lobar, segmental, or proximal subsegmental filling defects. Mediastinum/Nodes: Multiple small mediastinal lymph nodes throughout all nodal stations. Unremarkable appearance of the thoracic esophagus. Unremarkable thoracic inlet Lungs/Pleura: Centrilobular and paraseptal emphysema. Respiratory motion limits evaluation for small nodules. No pneumothorax or pleural effusion. No confluent airspace disease. Review of the MIP images confirms the above findings. CT ABDOMEN and PELVIS FINDINGS Hepatobiliary: Unremarkable liver. Gallbladder is distended with radiopaque stones in the neck of the gallbladder. No significant pericholecystic fluid. Mild inflammatory changes of the gallbladder wall and the adjacent fat. No definite radiopaque stones identified in the region of the common bile duct. Pancreas: Unremarkable Spleen: Unremarkable Adrenals/Urinary Tract: - Right adrenal gland:  Unremarkable - Left adrenal gland: Unremarkable. - Right kidney: No hydronephrosis, nephrolithiasis, inflammation, or ureteral dilation. Hypodense lesion on the posterior cortex of the  right kidney is too small to characterize. Hyperdense focal lesion on the lateral cortex of the right kidney was present on the comparison CT and most likely represents a complex cyst given the decreasing size though is strictly too small to characterize. - Left Kidney: No hydronephrosis, nephrolithiasis, inflammation, or ureteral dilation. No focal lesion. - Urinary Bladder: Urinary bladder relatively decompressed. Stomach/Bowel: - Stomach: Small hiatal hernia.  Otherwise unremarkable stomach. - Small bowel: Unremarkable - Appendix: Normal. - Colon: Colonic diverticula of the sigmoid colon and the left colon. No focal inflammatory changes. No bowel obstruction. Mild stool burden. Vascular/Lymphatic: Atherosclerosis of the abdominal aorta. No periaortic fluid or inflammatory changes. Greatest diameter of the aorta is estimated 3.4 cm. Irregular plaque versus chronic dissection just above the bifurcation. Atherosclerotic changes of the bilateral iliac arteries. Bilateral iliac arteries are patent. Proximal femoral arteries are patent. Reproductive: Calcifications of the prostate. Other: Fat containing umbilical hernia. Musculoskeletal: Osteopenia. Degenerative changes of the visualized spine no acute displaced  vertebral body fracture. Kyphotic deformity of the thoracic spine likely degenerative. No bony canal narrowing.  No aggressive bony lesions identified. Treatment changes of prior vertebral augmentation of L3. Surgical changes of prior left femur repair. Review of the MIP images confirms the above findings. IMPRESSION: No acute finding of the chest CT angiogram. Cholelithiasis with distended gallbladder and vague inflammatory changes of the gallbladder wall. These findings may reflect early acute cholecystitis. Correlation with lab values and patient presentation may be useful, as well as consideration of confirmatory HIDA study. Ultrasound will likely yield no further useful imaging information. Emphysema  (ICD10-J43.9). Aortic Atherosclerosis (ICD10-I70.0). Associated coronary artery disease, bilateral iliac arterial disease, irregular plaque of the abdominal aorta. Diverticular disease without evidence of acute diverticulitis. Additional ancillary findings as above. Electronically Signed   By: Corrie Mckusick D.O.   On: 11/18/2019 13:26   CT Cervical Spine Wo Contrast  Result Date: 11/18/2019 CLINICAL DATA:  [place] at home yesterday. [place] again this morning. Confusion. EXAM: CT HEAD WITHOUT CONTRAST CT CERVICAL SPINE WITHOUT CONTRAST TECHNIQUE: Multidetector CT imaging of the head and cervical spine was performed following the standard protocol without intravenous contrast. Multiplanar CT image reconstructions of the cervical spine were also generated. COMPARISON:  Head CT 12/21/2017 FINDINGS: CT HEAD FINDINGS Brain: Stable age related significant cerebral atrophy, ventriculomegaly and periventricular white matter disease. No extra-axial fluid collections are identified. No CT findings for acute hemispheric infarction or intracranial hemorrhage. No mass lesions. The brainstem and cerebellum are normal. Vascular: Stable vascular calcifications. No hyperdense vessels or obvious aneurysm. Skull: No skull fracture or bone lesions. Sinuses/Orbits: The paranasal sinuses and mastoid air cells are clear. The globes are intact. Other: No scalp lesions or obvious laceration or foreign body. CT CERVICAL SPINE FINDINGS Alignment: Normal overall alignment. Mild degenerative anterior subluxation of C7 compared to T1. The facet joints are fused at this disc space level. Skull base and vertebrae: No acute fracture. No primary bone lesion or focal pathologic process. Soft tissues and spinal canal: No prevertebral fluid or swelling. No visible canal hematoma. Disc levels: The spinal canal is fairly generous. No significant spinal stenosis. Multilevel facet disease and mild uncinate spurring changes contributing to mild multilevel bony  foraminal narrowing. Upper chest: The lung apices are grossly clear. Other: No neck mass or adenopathy. Carotid artery calcifications are noted. IMPRESSION: 1. Stable age related cerebral atrophy, ventriculomegaly and periventricular white matter disease. 2. No acute intracranial findings or skull fracture. 3. Degenerative cervical spondylosis with multilevel disc disease, facet disease and uncinate spurring changes but no acute cervical spine fracture. Electronically Signed   By: Marijo Sanes M.D.   On: 11/18/2019 13:19   CT ABDOMEN PELVIS W CONTRAST  Result Date: 11/18/2019 CLINICAL DATA:  86 year old male with abdominal trauma EXAM: CT ANGIOGRAPHY CHEST CT ABDOMEN AND PELVIS WITH CONTRAST TECHNIQUE: Multidetector CT imaging of the chest was performed using the standard protocol during bolus administration of intravenous contrast. Multiplanar CT image reconstructions and MIPs were obtained to evaluate the vascular anatomy. Multidetector CT imaging of the abdomen and pelvis was performed using the standard protocol during bolus administration of intravenous contrast. CONTRAST:  33mL OMNIPAQUE IOHEXOL 350 MG/ML SOLN COMPARISON:  09/18/2015 FINDINGS: CTA CHEST FINDINGS Cardiovascular: Heart: No cardiomegaly. No pericardial fluid/thickening. Calcifications of left main, left anterior descending, circumflex, right coronary arteries. Aorta: Calcifications of the aortic valve. Unremarkable course caliber and contour of the thoracic aorta. Moderate atherosclerosis of the aorta including some irregular intimal plaque of the sending thoracic aorta. No  ulcerated plaque or evidence of dissection. Pulmonary arteries: No central, lobar, segmental, or proximal subsegmental filling defects. Mediastinum/Nodes: Multiple small mediastinal lymph nodes throughout all nodal stations. Unremarkable appearance of the thoracic esophagus. Unremarkable thoracic inlet Lungs/Pleura: Centrilobular and paraseptal emphysema. Respiratory  motion limits evaluation for small nodules. No pneumothorax or pleural effusion. No confluent airspace disease. Review of the MIP images confirms the above findings. CT ABDOMEN and PELVIS FINDINGS Hepatobiliary: Unremarkable liver. Gallbladder is distended with radiopaque stones in the neck of the gallbladder. No significant pericholecystic fluid. Mild inflammatory changes of the gallbladder wall and the adjacent fat. No definite radiopaque stones identified in the region of the common bile duct. Pancreas: Unremarkable Spleen: Unremarkable Adrenals/Urinary Tract: - Right adrenal gland:  Unremarkable - Left adrenal gland: Unremarkable. - Right kidney: No hydronephrosis, nephrolithiasis, inflammation, or ureteral dilation. Hypodense lesion on the posterior cortex of the right kidney is too small to characterize. Hyperdense focal lesion on the lateral cortex of the right kidney was present on the comparison CT and most likely represents a complex cyst given the decreasing size though is strictly too small to characterize. - Left Kidney: No hydronephrosis, nephrolithiasis, inflammation, or ureteral dilation. No focal lesion. - Urinary Bladder: Urinary bladder relatively decompressed. Stomach/Bowel: - Stomach: Small hiatal hernia.  Otherwise unremarkable stomach. - Small bowel: Unremarkable - Appendix: Normal. - Colon: Colonic diverticula of the sigmoid colon and the left colon. No focal inflammatory changes. No bowel obstruction. Mild stool burden. Vascular/Lymphatic: Atherosclerosis of the abdominal aorta. No periaortic fluid or inflammatory changes. Greatest diameter of the aorta is estimated 3.4 cm. Irregular plaque versus chronic dissection just above the bifurcation. Atherosclerotic changes of the bilateral iliac arteries. Bilateral iliac arteries are patent. Proximal femoral arteries are patent. Reproductive: Calcifications of the prostate. Other: Fat containing umbilical hernia. Musculoskeletal: Osteopenia.  Degenerative changes of the visualized spine no acute displaced vertebral body fracture. Kyphotic deformity of the thoracic spine likely degenerative. No bony canal narrowing.  No aggressive bony lesions identified. Treatment changes of prior vertebral augmentation of L3. Surgical changes of prior left femur repair. Review of the MIP images confirms the above findings. IMPRESSION: No acute finding of the chest CT angiogram. Cholelithiasis with distended gallbladder and vague inflammatory changes of the gallbladder wall. These findings may reflect early acute cholecystitis. Correlation with lab values and patient presentation may be useful, as well as consideration of confirmatory HIDA study. Ultrasound will likely yield no further useful imaging information. Emphysema (ICD10-J43.9). Aortic Atherosclerosis (ICD10-I70.0). Associated coronary artery disease, bilateral iliac arterial disease, irregular plaque of the abdominal aorta. Diverticular disease without evidence of acute diverticulitis. Additional ancillary findings as above. Electronically Signed   By: Corrie Mckusick D.O.   On: 11/18/2019 13:26   MR 3D Recon At Scanner  Result Date: 11/18/2019 CLINICAL DATA:  Cholelithiasis with elevated bilirubin. EXAM: MRI ABDOMEN WITHOUT AND WITH CONTRAST (INCLUDING MRCP) TECHNIQUE: Multiplanar multisequence MR imaging of the abdomen was performed both before and after the administration of intravenous contrast. Heavily T2-weighted images of the biliary and pancreatic ducts were obtained, and three-dimensional MRCP images were rendered by post processing. CONTRAST:  38mL GADAVIST GADOBUTROL 1 MMOL/ML IV SOLN COMPARISON:  CT scan 11/18/2019 FINDINGS: Motion degraded study due to patient inability to reproducibly breath hold. Lower chest: Unremarkable. Hepatobiliary: 8 mm cyst noted inferior right liver. Gallbladder is distended with layering tiny gallstones. Common duct measures 7 mm diameter. There is abnormal signal in  the distal common bile duct. This is not well evaluated due to motion  artifact and could represent sludge impacted in the distal common bile duct although tiny layering/stacked gallstones cannot be excluded. Pancreas: Limited assessment due to motion degradation on postcontrast imaging. No focal mass lesion. No dilatation of the main duct. No intraparenchymal cyst. No peripancreatic edema. Spleen:  No gross abnormality. Adrenals/Urinary Tract: No adrenal nodule or mass. Bilateral renal cysts. Exophytic 10 mm complex cystic lesion in the interpolar right kidney (image 27/3 and image 330/18). This is not well evaluated due to motion degradation but comparing today's CT to a CT scan from 09/18/2015, this lesion has decreased in size in the interval consistent with benign etiology. Stomach/Bowel: Stomach is moderately distended. No small bowel or colonic dilatation within the visualized abdomen. Vascular/Lymphatic: Atherosclerotic changes noted in the abdominal aorta. No abdominal lymphadenopathy. Other:  No intraperitoneal free fluid. Musculoskeletal: No suspicious marrow enhancement within the visualized bony anatomy. IMPRESSION: 1. Motion degraded study due to patient inability to reproducibly breath hold. 2. There is abnormal signal in the 7 mm diameter distal common bile duct with loss of normal T2 hyperintensity. This is not well evaluated due to motion artifact and could represent sludge or stacked gallstones. No suspicious enhancement to suggest soft tissue mass, but follow-up likely warranted. 3. Cholelithiasis. 4. Right renal cysts. Electronically Signed   By: Misty Stanley M.D.   On: 11/18/2019 16:04   MR ABDOMEN MRCP W WO CONTAST  Result Date: 11/18/2019 CLINICAL DATA:  Cholelithiasis with elevated bilirubin. EXAM: MRI ABDOMEN WITHOUT AND WITH CONTRAST (INCLUDING MRCP) TECHNIQUE: Multiplanar multisequence MR imaging of the abdomen was performed both before and after the administration of intravenous  contrast. Heavily T2-weighted images of the biliary and pancreatic ducts were obtained, and three-dimensional MRCP images were rendered by post processing. CONTRAST:  77mL GADAVIST GADOBUTROL 1 MMOL/ML IV SOLN COMPARISON:  CT scan 11/18/2019 FINDINGS: Motion degraded study due to patient inability to reproducibly breath hold. Lower chest: Unremarkable. Hepatobiliary: 8 mm cyst noted inferior right liver. Gallbladder is distended with layering tiny gallstones. Common duct measures 7 mm diameter. There is abnormal signal in the distal common bile duct. This is not well evaluated due to motion artifact and could represent sludge impacted in the distal common bile duct although tiny layering/stacked gallstones cannot be excluded. Pancreas: Limited assessment due to motion degradation on postcontrast imaging. No focal mass lesion. No dilatation of the main duct. No intraparenchymal cyst. No peripancreatic edema. Spleen:  No gross abnormality. Adrenals/Urinary Tract: No adrenal nodule or mass. Bilateral renal cysts. Exophytic 10 mm complex cystic lesion in the interpolar right kidney (image 27/3 and image 330/18). This is not well evaluated due to motion degradation but comparing today's CT to a CT scan from 09/18/2015, this lesion has decreased in size in the interval consistent with benign etiology. Stomach/Bowel: Stomach is moderately distended. No small bowel or colonic dilatation within the visualized abdomen. Vascular/Lymphatic: Atherosclerotic changes noted in the abdominal aorta. No abdominal lymphadenopathy. Other:  No intraperitoneal free fluid. Musculoskeletal: No suspicious marrow enhancement within the visualized bony anatomy. IMPRESSION: 1. Motion degraded study due to patient inability to reproducibly breath hold. 2. There is abnormal signal in the 7 mm diameter distal common bile duct with loss of normal T2 hyperintensity. This is not well evaluated due to motion artifact and could represent sludge or  stacked gallstones. No suspicious enhancement to suggest soft tissue mass, but follow-up likely warranted. 3. Cholelithiasis. 4. Right renal cysts. Electronically Signed   By: Misty Stanley M.D.   On: 11/18/2019 16:04  EKG: I independently viewed the EKG done and my findings are as followed: Initial EKG done showed atrial fibrillation, but subsequent EKG showed normal sinus rhythm at rate of 80bpm  Assessment/Plan Present on Admission: . Cholelithiasis . Disease of thyroid gland . BP (high blood pressure) . HLD (hyperlipidemia) . Elevated liver enzymes  Principal Problem:   Cholelithiasis Active Problems:   Disease of thyroid gland   BP (high blood pressure)   HLD (hyperlipidemia)   BPH (benign prostatic hyperplasia)   Elevated liver enzymes   Dementia without behavioral disturbance ([place])   Accident due to mechanical fall without injury   Hyperglycemia due to type 2 diabetes mellitus (H[facility])   Leukocytosis   AKI (acute kidney injury) ([place])   Troponin I above reference range   COPD (chronic obstructive pulmonary disease) ([facility])   Abdominal pain secondary to cholelithiasis MRCP was done and showed abnormal signal in the 7 mm diameter distal common bile duct with loss of normal T2 hyperintensity.  This is not well evaluated due to motion artifact and could represent sludge or static gallstones.  No suspicious enhancement to suggest soft tissue mass but follow-up likely warranted.  Cholelithiasis was noted.  Gastroenterologist was consulted at [facility] and recommended for patient to be transferred to [facility] for ERCP Continue IV morphine 1 mg every 4 hours as needed for moderate/severe pain Patient currently n.p.o., diet can be resumed after ERCP  Elevated liver enzymes/total bilirubin possibly secondary to above AST 192, ALT 174, total bilirubin 4.6 Continue to monitor liver panel  Leukocytosis WBC was elevated at 23.5, patient was afebrile and no obvious source of infection  noted at this time He was empirically given 1 dose of Zosyn at [facility] Procalcitonin level will be checked prior to continue with IV antibiotics at this time  Hyperglycemia secondary to type 2 diabetes mellitus Continue insulin sliding scale and hypoglycemia protocol Metformin will be held at this time  Acute kidney injury Creatinine on admission= 1.34; baseline creatinine 0.6-0.8) Avoid nephrotoxic drugs Continue to monitor BUN to creatinine with morning labs  Troponin I above reference range possibly due to demand ischemia Patient denies any chest pain at this time Troponin 21> 22; continue to trend troponin  Mechanical fall possible secondary to ambulatory dysfunction Continue fall precaution and neuro checks Continue PT/OT eval and treat  Essential hypertension Continue Coreg; hold HCTZ and losartan due to acute kidney injury  Hyperlipidemia No antihyperlipidemic medication noted on med rec, we shall await updated med rec  Hypothyroidism Continue Synthroid per home regimen  COPD Continue Atrovent per home regimen  BPH Continue tamsulosin per home regimen  Dementia Continue Donepezil per home regimen   DVT prophylaxis: SCDs (no indication for chemoprophylaxis at this time due to possible ERCP in the morning)  Code Status: Full code  Family Communication: None at bedside  Disposition Plan: Currently inpatient with plan to discharge patient possibly to home with PT/SNF pending further work-up and clinical course  Consults called: None  Admission status: Inpatient    Bernadette Hoit MD [facility]  If 7PM-7AM, please contact night-coverage [email or web address] Password [facility]  11/19/2019, 2:31 AM

## 2019-11-18 NOTE — ED Triage Notes (Signed)
Pt arrival via [facility] from home due to fall. Pt had a fall yesterday and called EMS which helped him get up, but he refused to come in. Today patient had another fall and wife states he has been more confused lately but does have a baseline dementia.  On arrival, EMS states that the patient began breathing more rapidly. Pt states he's currently having pain in his abdomin and in his lower back.   VS with EMS: -BS 474 -BP 139/73 -97% RA -HR 92

## 2019-11-18 NOTE — ED Provider Notes (Signed)
[facility] Emergency Department Provider Note  ____________________________________________   First MD Initiated Contact with Patient 11/18/19 1129     (approximate)  I have reviewed the triage vital signs and the nursing notes.   HISTORY  Chief Complaint Fall and Abdominal Pain    HPI David Morgan is a 86 y.o. male with hypertension, hyperlipidemia who comes in with a fall.  Patient has a history of dementia but states according to family members she seems more confused today.  They state he had a fall yesterday and EMS helped him get up but he did not want to come in.  Patient had another fall today.  Patient did hit his head.  Unsure if he lost consciousness.  Patient is breathing heavily and does report abdominal pain that is severe, constant, nothing makes it better, nothing makes it worse.           Past Medical History:  Diagnosis Date   Arthritis    Cancer [facility])    Claudication ([place])    Diverticulitis    Emphysema/COPD ([place])    History of bladder cancer    History of carotid artery stenosis    History of TIA (transient ischemic attack)    2012--  NO RESIDUAL (PER SCAN HAD A PREVIOUS TIA BEFORE 2012)   Hyperlipidemia    Hypertension    Hypothyroidism    Lesion of bladder    Mild asthma    NO INHALER   Nocturia    Peripheral vascular disease (HCC)    Short of breath on exertion    Urgency of urination    Wears glasses     Patient Active Problem List   Diagnosis Date Noted   Pressure injury of skin 12/22/2017   Back pain 12/21/2017   History of diverticulitis 05/02/2016   Difficulty walking 02/04/2016   Tremor 02/04/2016   Weakness 02/04/2016   Disease of thyroid gland 07/10/2015   BP (high blood pressure) 07/10/2015   HLD (hyperlipidemia) 07/10/2015   Benign fibroma of prostate 08/02/2014    Past Surgical History:  Procedure Laterality Date   CAROTID ENDARTERECTOMY Right 2005    CARPAL TUNNEL RELEASE Bilateral 2002  &  2007   CATARACT EXTRACTION W/ INTRAOCULAR LENS  IMPLANT, BILATERAL     CYSTOSCOPY W/ RETROGRADES Bilateral 12/26/2013   Procedure: BILATERAL RETROGRADE PYELOGRAM;  Surgeon: Claybon Jabs, MD;  Location: [place];  Service: Urology;  Laterality: Bilateral;   CYSTOSCOPY WITH BIOPSY N/A 12/26/2013   Procedure: CYSTOSCOPY WITH BLADDER BIOPSY;  Surgeon: Claybon Jabs, MD;  Location: [facility];  Service: Urology;  Laterality: N/A;   INGUINAL HERNIA REPAIR  YRS AGO   LAPAROSCOPIC LYSIS OF ADHESIONS  06/28/2015   Procedure: LAPAROSCOPIC LYSIS OF ADHESIONS;  Surgeon: Clayburn Pert, MD;  Location: [facility];  Service: General;;   LAPAROSCOPY  06/28/2015   Procedure: LAPAROSCOPY DIAGNOSTIC;  Surgeon: Clayburn Pert, MD;  Location: [facility];  Service: General;;   LAPAROTOMY N/A 09/03/2015   Procedure: umbilical hernia repair with mesh;  Surgeon: Hubbard Robinson, MD;  Location: [facility];  Service: General;  Laterality: N/A;   ORIF HIP FRACTURE Left 2008   RETAINED HARDWARE   RIGHT SHOULDER  SURGERY  2005   TOTAL KNEE ARTHROPLASTY Right 2004   TRANSURETHRAL RESECTION OF BLADDER TUMOR  0000000   UMBILICAL HERNIA REPAIR   2009  &  2011    Prior to Admission medications   Medication Sig Start  Date End Date Taking? Authorizing Provider  acetaminophen (TYLENOL) 500 MG tablet Take 2 tablets (1,000 mg total) by mouth every 6 (six) hours as needed. 09/21/15   Hubbard Robinson, MD  carvedilol (COREG) 3.125 MG tablet Take 3.125 mg by mouth 2 (two) times daily with a meal.    [provider]  Hydrocodone-Acetaminophen (VICODIN) 5-300 MG TABS Take 1 tablet by mouth every 4 (four) hours as needed. Patient not taking: Reported on 12/21/2017 09/21/15   Hubbard Robinson, MD  levothyroxine (SYNTHROID, LEVOTHROID) 112 MCG tablet Take 112 mcg by mouth daily before breakfast.    [provider]  losartan (COZAAR) 50  MG tablet Take 50 mg by mouth daily.  06/26/15   [provider]  Multiple Vitamin (MULTI-VITAMINS) TABS Take 1 tablet by mouth daily.    [provider]  ondansetron (ZOFRAN) 4 MG tablet Take 1 tablet (4 mg total) by mouth every 6 (six) hours as needed for nausea. Patient not taking: Reported on 12/21/2017 08/10/15   Sherri Rad, MD  rOPINIRole (REQUIP) 0.5 MG tablet Take 0.5 mg by mouth at bedtime.    [provider]  tamsulosin (FLOMAX) 0.4 MG CAPS capsule Take 0.8 mg by mouth every evening.    [provider]    Allergies Adhesive [tape] and Augmentin [amoxicillin-pot clavulanate]  Family History  Problem Relation Age of Onset   Hypertension Father    Alzheimer's disease Mother     Social History Social History   Tobacco Use   Smoking status: Former Smoker    Packs/day: 1.00    Years: 40.00    Pack years: 40.00    Types: Cigarettes    Quit date: 12/21/1993    Years since quitting: 25.9   Smokeless tobacco: Never Used  Substance Use Topics   Alcohol use: No    Comment: RARE   Drug use: No      Review of Systems Constitutional: No fever/chills, positive fall Eyes: No visual changes. ENT: No sore throat. Cardiovascular: Denies chest pain. Respiratory: Positive shortness of breath  Gastrointestinal: Positive abdominal pain no nausea, no vomiting.  No diarrhea.  No constipation. Genitourinary: Negative for dysuria. Musculoskeletal: Negative for back pain. Skin: Negative for rash. Neurological: Negative for headaches, focal weakness or numbness. All other ROS negative ____________________________________________   PHYSICAL EXAM:  VITAL SIGNS: ED Triage Vitals  Enc Vitals Group     BP 11/18/19 1120 137/66     Pulse Rate 11/18/19 1120 81     Resp 11/18/19 1120 (!) 26     Temp 11/18/19 1120 (!) 97.5 F (36.4 C)     Temp src --      SpO2 11/18/19 1120 98 %     Weight 11/18/19 1121 229 lb 4.5 oz (104 kg)     Height 11/18/19  1121 5\' 9"  (1.753 m)     Head Circumference --      Peak Flow --      Pain Score --      Pain Loc --      Pain Edu? --      Excl. in [place]? --     Constitutional: Alert and oriented appears to be in discomfort Eyes: Conjunctivae are normal. EOMI. Head: Atraumatic. Nose: No congestion/rhinnorhea. Mouth/Throat: Mucous membranes are moist.   Neck: No stridor. Trachea Midline. FROM Cardiovascular: Normal rate, regular rhythm. Grossly normal heart sounds.  Good peripheral circulation. Respiratory:  No retractions. Lungs CTAB.  Increased respiratory rate Gastrointestinal: Abdomen is distended  and tender throughout  No abdominal bruits.  Musculoskeletal: No lower extremity tenderness nor edema.  No joint effusions. Neurologic:  Normal speech and language. No gross focal neurologic deficits are appreciated.  Skin:  Skin is warm, dry and intact. No rash noted. Psychiatric: Mood and affect are normal. Speech and behavior are normal. GU: Deferred   ____________________________________________   LABS (all labs ordered are listed, but only abnormal results are displayed)  Labs Reviewed  CBC WITH DIFFERENTIAL/PLATELET - Abnormal; Notable for the following components:      Result Value   WBC 23.5 (*)    Neutro Abs 21.7 (*)    Abs Immature Granulocytes 0.13 (*)    All other components within normal limits  COMPREHENSIVE METABOLIC PANEL - Abnormal; Notable for the following components:   Sodium 134 (*)    Chloride 97 (*)    Glucose, Bld 386 (*)    BUN 28 (*)    Creatinine, Ser 1.34 (*)    Total Protein 8.3 (*)    AST 192 (*)    ALT 174 (*)    Total Bilirubin 4.6 (*)    GFR calc non Af Amer 46 (*)    GFR calc Af Amer 53 (*)    All other components within normal limits  URINALYSIS, COMPLETE (UACMP) WITH MICROSCOPIC - Abnormal; Notable for the following components:   Color, Urine AMBER (*)    APPearance CLEAR (*)    Glucose, UA >=500 (*)    All other components within normal limits    TROPONIN I (HIGH SENSITIVITY) - Abnormal; Notable for the following components:   Troponin I (High Sensitivity) 21 (*)    All other components within normal limits  TROPONIN I (HIGH SENSITIVITY) - Abnormal; Notable for the following components:   Troponin I (High Sensitivity) 22 (*)    All other components within normal limits  LIPASE, BLOOD   ____________________________________________   ED ECG REPORT I, Vanessa [place], the attending physician, personally viewed and interpreted this ECG.  EKG is normal sinus rate of 86, no ST elevation, no T wave inversion however patient was moving around a lot so there is a lot of artifact, grossly normal intervals ____________________________________________  RADIOLOGY   Official radiology report(s): CT Head Wo Contrast  Result Date: 11/18/2019 CLINICAL DATA:  [place] at home yesterday. [place] again this morning. Confusion. EXAM: CT HEAD WITHOUT CONTRAST CT CERVICAL SPINE WITHOUT CONTRAST TECHNIQUE: Multidetector CT imaging of the head and cervical spine was performed following the standard protocol without intravenous contrast. Multiplanar CT image reconstructions of the cervical spine were also generated. COMPARISON:  Head CT 12/21/2017 FINDINGS: CT HEAD FINDINGS Brain: Stable age related significant cerebral atrophy, ventriculomegaly and periventricular white matter disease. No extra-axial fluid collections are identified. No CT findings for acute hemispheric infarction or intracranial hemorrhage. No mass lesions. The brainstem and cerebellum are normal. Vascular: Stable vascular calcifications. No hyperdense vessels or obvious aneurysm. Skull: No skull fracture or bone lesions. Sinuses/Orbits: The paranasal sinuses and mastoid air cells are clear. The globes are intact. Other: No scalp lesions or obvious laceration or foreign body. CT CERVICAL SPINE FINDINGS Alignment: Normal overall alignment. Mild degenerative anterior subluxation of C7 compared to T1.  The facet joints are fused at this disc space level. Skull base and vertebrae: No acute fracture. No primary bone lesion or focal pathologic process. Soft tissues and spinal canal: No prevertebral fluid or swelling. No visible canal hematoma. Disc levels: The spinal canal is fairly generous. No significant  spinal stenosis. Multilevel facet disease and mild uncinate spurring changes contributing to mild multilevel bony foraminal narrowing. Upper chest: The lung apices are grossly clear. Other: No neck mass or adenopathy. Carotid artery calcifications are noted. IMPRESSION: 1. Stable age related cerebral atrophy, ventriculomegaly and periventricular white matter disease. 2. No acute intracranial findings or skull fracture. 3. Degenerative cervical spondylosis with multilevel disc disease, facet disease and uncinate spurring changes but no acute cervical spine fracture. Electronically Signed   By: Marijo Sanes M.D.   On: 11/18/2019 13:19   CT Angio Chest PE W and/or Wo Contrast  Result Date: 11/18/2019 CLINICAL DATA:  86 year old male with abdominal trauma EXAM: CT ANGIOGRAPHY CHEST CT ABDOMEN AND PELVIS WITH CONTRAST TECHNIQUE: Multidetector CT imaging of the chest was performed using the standard protocol during bolus administration of intravenous contrast. Multiplanar CT image reconstructions and MIPs were obtained to evaluate the vascular anatomy. Multidetector CT imaging of the abdomen and pelvis was performed using the standard protocol during bolus administration of intravenous contrast. CONTRAST:  26mL OMNIPAQUE IOHEXOL 350 MG/ML SOLN COMPARISON:  09/18/2015 FINDINGS: CTA CHEST FINDINGS Cardiovascular: Heart: No cardiomegaly. No pericardial fluid/thickening. Calcifications of left main, left anterior descending, circumflex, right coronary arteries. Aorta: Calcifications of the aortic valve. Unremarkable course caliber and contour of the thoracic aorta. Moderate atherosclerosis of the aorta including some  irregular intimal plaque of the sending thoracic aorta. No ulcerated plaque or evidence of dissection. Pulmonary arteries: No central, lobar, segmental, or proximal subsegmental filling defects. Mediastinum/Nodes: Multiple small mediastinal lymph nodes throughout all nodal stations. Unremarkable appearance of the thoracic esophagus. Unremarkable thoracic inlet Lungs/Pleura: Centrilobular and paraseptal emphysema. Respiratory motion limits evaluation for small nodules. No pneumothorax or pleural effusion. No confluent airspace disease. Review of the MIP images confirms the above findings. CT ABDOMEN and PELVIS FINDINGS Hepatobiliary: Unremarkable liver. Gallbladder is distended with radiopaque stones in the neck of the gallbladder. No significant pericholecystic fluid. Mild inflammatory changes of the gallbladder wall and the adjacent fat. No definite radiopaque stones identified in the region of the common bile duct. Pancreas: Unremarkable Spleen: Unremarkable Adrenals/Urinary Tract: - Right adrenal gland:  Unremarkable - Left adrenal gland: Unremarkable. - Right kidney: No hydronephrosis, nephrolithiasis, inflammation, or ureteral dilation. Hypodense lesion on the posterior cortex of the right kidney is too small to characterize. Hyperdense focal lesion on the lateral cortex of the right kidney was present on the comparison CT and most likely represents a complex cyst given the decreasing size though is strictly too small to characterize. - Left Kidney: No hydronephrosis, nephrolithiasis, inflammation, or ureteral dilation. No focal lesion. - Urinary Bladder: Urinary bladder relatively decompressed. Stomach/Bowel: - Stomach: Small hiatal hernia.  Otherwise unremarkable stomach. - Small bowel: Unremarkable - Appendix: Normal. - Colon: Colonic diverticula of the sigmoid colon and the left colon. No focal inflammatory changes. No bowel obstruction. Mild stool burden. Vascular/Lymphatic: Atherosclerosis of the  abdominal aorta. No periaortic fluid or inflammatory changes. Greatest diameter of the aorta is estimated 3.4 cm. Irregular plaque versus chronic dissection just above the bifurcation. Atherosclerotic changes of the bilateral iliac arteries. Bilateral iliac arteries are patent. Proximal femoral arteries are patent. Reproductive: Calcifications of the prostate. Other: Fat containing umbilical hernia. Musculoskeletal: Osteopenia. Degenerative changes of the visualized spine no acute displaced vertebral body fracture. Kyphotic deformity of the thoracic spine likely degenerative. No bony canal narrowing.  No aggressive bony lesions identified. Treatment changes of prior vertebral augmentation of L3. Surgical changes of prior left femur repair. Review of the MIP images  confirms the above findings. IMPRESSION: No acute finding of the chest CT angiogram. Cholelithiasis with distended gallbladder and vague inflammatory changes of the gallbladder wall. These findings may reflect early acute cholecystitis. Correlation with lab values and patient presentation may be useful, as well as consideration of confirmatory HIDA study. Ultrasound will likely yield no further useful imaging information. Emphysema (ICD10-J43.9). Aortic Atherosclerosis (ICD10-I70.0). Associated coronary artery disease, bilateral iliac arterial disease, irregular plaque of the abdominal aorta. Diverticular disease without evidence of acute diverticulitis. Additional ancillary findings as above. Electronically Signed   By: Corrie Mckusick D.O.   On: 11/18/2019 13:26   CT Cervical Spine Wo Contrast  Result Date: 11/18/2019 CLINICAL DATA:  [place] at home yesterday. [place] again this morning. Confusion. EXAM: CT HEAD WITHOUT CONTRAST CT CERVICAL SPINE WITHOUT CONTRAST TECHNIQUE: Multidetector CT imaging of the head and cervical spine was performed following the standard protocol without intravenous contrast. Multiplanar CT image reconstructions of the cervical  spine were also generated. COMPARISON:  Head CT 12/21/2017 FINDINGS: CT HEAD FINDINGS Brain: Stable age related significant cerebral atrophy, ventriculomegaly and periventricular white matter disease. No extra-axial fluid collections are identified. No CT findings for acute hemispheric infarction or intracranial hemorrhage. No mass lesions. The brainstem and cerebellum are normal. Vascular: Stable vascular calcifications. No hyperdense vessels or obvious aneurysm. Skull: No skull fracture or bone lesions. Sinuses/Orbits: The paranasal sinuses and mastoid air cells are clear. The globes are intact. Other: No scalp lesions or obvious laceration or foreign body. CT CERVICAL SPINE FINDINGS Alignment: Normal overall alignment. Mild degenerative anterior subluxation of C7 compared to T1. The facet joints are fused at this disc space level. Skull base and vertebrae: No acute fracture. No primary bone lesion or focal pathologic process. Soft tissues and spinal canal: No prevertebral fluid or swelling. No visible canal hematoma. Disc levels: The spinal canal is fairly generous. No significant spinal stenosis. Multilevel facet disease and mild uncinate spurring changes contributing to mild multilevel bony foraminal narrowing. Upper chest: The lung apices are grossly clear. Other: No neck mass or adenopathy. Carotid artery calcifications are noted. IMPRESSION: 1. Stable age related cerebral atrophy, ventriculomegaly and periventricular white matter disease. 2. No acute intracranial findings or skull fracture. 3. Degenerative cervical spondylosis with multilevel disc disease, facet disease and uncinate spurring changes but no acute cervical spine fracture. Electronically Signed   By: Marijo Sanes M.D.   On: 11/18/2019 13:19   CT ABDOMEN PELVIS W CONTRAST  Result Date: 11/18/2019 CLINICAL DATA:  86 year old male with abdominal trauma EXAM: CT ANGIOGRAPHY CHEST CT ABDOMEN AND PELVIS WITH CONTRAST TECHNIQUE: Multidetector CT  imaging of the chest was performed using the standard protocol during bolus administration of intravenous contrast. Multiplanar CT image reconstructions and MIPs were obtained to evaluate the vascular anatomy. Multidetector CT imaging of the abdomen and pelvis was performed using the standard protocol during bolus administration of intravenous contrast. CONTRAST:  17mL OMNIPAQUE IOHEXOL 350 MG/ML SOLN COMPARISON:  09/18/2015 FINDINGS: CTA CHEST FINDINGS Cardiovascular: Heart: No cardiomegaly. No pericardial fluid/thickening. Calcifications of left main, left anterior descending, circumflex, right coronary arteries. Aorta: Calcifications of the aortic valve. Unremarkable course caliber and contour of the thoracic aorta. Moderate atherosclerosis of the aorta including some irregular intimal plaque of the sending thoracic aorta. No ulcerated plaque or evidence of dissection. Pulmonary arteries: No central, lobar, segmental, or proximal subsegmental filling defects. Mediastinum/Nodes: Multiple small mediastinal lymph nodes throughout all nodal stations. Unremarkable appearance of the thoracic esophagus. Unremarkable thoracic inlet Lungs/Pleura: Centrilobular and paraseptal emphysema.  Respiratory motion limits evaluation for small nodules. No pneumothorax or pleural effusion. No confluent airspace disease. Review of the MIP images confirms the above findings. CT ABDOMEN and PELVIS FINDINGS Hepatobiliary: Unremarkable liver. Gallbladder is distended with radiopaque stones in the neck of the gallbladder. No significant pericholecystic fluid. Mild inflammatory changes of the gallbladder wall and the adjacent fat. No definite radiopaque stones identified in the region of the common bile duct. Pancreas: Unremarkable Spleen: Unremarkable Adrenals/Urinary Tract: - Right adrenal gland:  Unremarkable - Left adrenal gland: Unremarkable. - Right kidney: No hydronephrosis, nephrolithiasis, inflammation, or ureteral dilation.  Hypodense lesion on the posterior cortex of the right kidney is too small to characterize. Hyperdense focal lesion on the lateral cortex of the right kidney was present on the comparison CT and most likely represents a complex cyst given the decreasing size though is strictly too small to characterize. - Left Kidney: No hydronephrosis, nephrolithiasis, inflammation, or ureteral dilation. No focal lesion. - Urinary Bladder: Urinary bladder relatively decompressed. Stomach/Bowel: - Stomach: Small hiatal hernia.  Otherwise unremarkable stomach. - Small bowel: Unremarkable - Appendix: Normal. - Colon: Colonic diverticula of the sigmoid colon and the left colon. No focal inflammatory changes. No bowel obstruction. Mild stool burden. Vascular/Lymphatic: Atherosclerosis of the abdominal aorta. No periaortic fluid or inflammatory changes. Greatest diameter of the aorta is estimated 3.4 cm. Irregular plaque versus chronic dissection just above the bifurcation. Atherosclerotic changes of the bilateral iliac arteries. Bilateral iliac arteries are patent. Proximal femoral arteries are patent. Reproductive: Calcifications of the prostate. Other: Fat containing umbilical hernia. Musculoskeletal: Osteopenia. Degenerative changes of the visualized spine no acute displaced vertebral body fracture. Kyphotic deformity of the thoracic spine likely degenerative. No bony canal narrowing.  No aggressive bony lesions identified. Treatment changes of prior vertebral augmentation of L3. Surgical changes of prior left femur repair. Review of the MIP images confirms the above findings. IMPRESSION: No acute finding of the chest CT angiogram. Cholelithiasis with distended gallbladder and vague inflammatory changes of the gallbladder wall. These findings may reflect early acute cholecystitis. Correlation with lab values and patient presentation may be useful, as well as consideration of confirmatory HIDA study. Ultrasound will likely yield no  further useful imaging information. Emphysema (ICD10-J43.9). Aortic Atherosclerosis (ICD10-I70.0). Associated coronary artery disease, bilateral iliac arterial disease, irregular plaque of the abdominal aorta. Diverticular disease without evidence of acute diverticulitis. Additional ancillary findings as above. Electronically Signed   By: Corrie Mckusick D.O.   On: 11/18/2019 13:26    ____________________________________________   PROCEDURES  Procedure(s) performed (including Critical Care):  .1-3 Lead EKG Interpretation Performed by: Vanessa [place], MD Authorized by: Vanessa [place], MD     Interpretation: normal     ECG rate:  80s   ECG rate assessment: normal     Rhythm: sinus rhythm     Ectopy: none     Conduction: normal       ____________________________________________   INITIAL IMPRESSION / ASSESSMENT AND PLAN / ED COURSE  David Morgan was evaluated in Emergency Department on 11/18/2019 for the symptoms described in the history of present illness. He was evaluated in the context of the global COVID-19 pandemic, which necessitated consideration that the patient might be at risk for infection with the SARS-CoV-2 virus that causes COVID-19. Institutional protocols and algorithms that pertain to the evaluation of patients at risk for COVID-19 are in a state of rapid change based on information released by regulatory bodies including the CDC and federal and state organizations. These  policies and algorithms were followed during the patient's care in the ED.    Patient comes in with fall.  Patient did hit his head.  Will get CT head evaluate for intracranial hemorrhage, CT cervical evaluate for cervical fracture.  Given patient's increased work of breathing will get CT PE make sure no evidence of pulmonary embolism and CT abdomen to make sure no evidence of SBO given patient's distended and diffusely tender.  We will keep patient on the cardiac monitor in case this was an  arrhythmia.  Patient's white count is significantly elevated at 23.5 this makes me concerned about infection.  Patient's anion gap is normal although glucose is elevated therefore no evidence of DKA  Patient has new significant abnormalities in his LFTs with a total bili of 4.6.  Concerned about gallbladder pathology.  Discussed with Dr. Peyton Najjar surgery team given T bili elevation recommend d/w GI for possible ERCP first.   Discussed with Dr. Vicente Males who recommends MRCP.  If positive patient will need to be transferred to [facility].  If negative patient can be admitted here.  Given respiratory rate and white count elevation patient meet sirs criteria.  Will get BCs and start on zosyn in meantime given most likely abdominal pathology..  Pt tolerated previously.  Pt handed off to incoming team pending MRCP.    ____________________________________________   FINAL CLINICAL IMPRESSION(S) / ED DIAGNOSES   Final diagnoses:  High bilirubin  Generalized abdominal pain  Fall, initial encounter      MEDICATIONS GIVEN DURING THIS VISIT:  Medications  piperacillin-tazobactam (ZOSYN) IVPB 3.375 g (has no administration in time range)  gadobutrol (GADAVIST) 1 MMOL/ML injection 10 mL (has no administration in time range)  iohexol (OMNIPAQUE) 350 MG/ML injection 75 mL (75 mLs Intravenous Contrast Given 11/18/19 1242)  HYDROmorphone (DILAUDID) injection 0.5 mg (0.5 mg Intravenous Given 11/18/19 1306)  ondansetron (ZOFRAN) injection 4 mg (4 mg Intravenous Given 11/18/19 1306)  sodium chloride 0.9 % bolus 500 mL (0 mLs Intravenous Stopped 11/18/19 1356)     ED Discharge Orders    None       Note:  This document was prepared using Dragon voice recognition software and may include unintentional dictation errors.   Vanessa Gurabo, MD 11/18/19 [phone]

## 2019-11-18 NOTE — ED Notes (Signed)
Pt unable to sign transfer form due to altered mental status, patient contact notified and gave verbal consent of transfer.

## 2019-11-19 ENCOUNTER — Encounter (HOSPITAL_COMMUNITY): Payer: Self-pay | Admitting: Internal Medicine

## 2019-11-19 ENCOUNTER — Inpatient Hospital Stay (HOSPITAL_COMMUNITY): Payer: PPO

## 2019-11-19 ENCOUNTER — Inpatient Hospital Stay (HOSPITAL_COMMUNITY): Payer: PPO | Admitting: Anesthesiology

## 2019-11-19 ENCOUNTER — Encounter (HOSPITAL_COMMUNITY)
Admission: AD | Disposition: A | Discharge status: Skilled Nursing Facility | Payer: Self-pay | Source: Other Acute Inpatient Hospital | Attending: Internal Medicine

## 2019-11-19 DIAGNOSIS — A419 Sepsis, unspecified organism: Secondary | ICD-10-CM | POA: Diagnosis present

## 2019-11-19 DIAGNOSIS — J441 Chronic obstructive pulmonary disease with (acute) exacerbation: Secondary | ICD-10-CM

## 2019-11-19 DIAGNOSIS — R778 Other specified abnormalities of plasma proteins: Secondary | ICD-10-CM

## 2019-11-19 DIAGNOSIS — D72829 Elevated white blood cell count, unspecified: Secondary | ICD-10-CM

## 2019-11-19 DIAGNOSIS — W19XXXA Unspecified fall, initial encounter: Secondary | ICD-10-CM

## 2019-11-19 DIAGNOSIS — J449 Chronic obstructive pulmonary disease, unspecified: Secondary | ICD-10-CM

## 2019-11-19 DIAGNOSIS — F0390 Unspecified dementia without behavioral disturbance: Secondary | ICD-10-CM

## 2019-11-19 DIAGNOSIS — E1165 Type 2 diabetes mellitus with hyperglycemia: Secondary | ICD-10-CM

## 2019-11-19 DIAGNOSIS — R6520 Severe sepsis without septic shock: Secondary | ICD-10-CM

## 2019-11-19 DIAGNOSIS — K8051 Calculus of bile duct without cholangitis or cholecystitis with obstruction: Secondary | ICD-10-CM

## 2019-11-19 DIAGNOSIS — N179 Acute kidney failure, unspecified: Secondary | ICD-10-CM | POA: Diagnosis present

## 2019-11-19 HISTORY — PX: SPHINCTEROTOMY: SHX5544

## 2019-11-19 HISTORY — PX: ERCP: SHX5425

## 2019-11-19 HISTORY — PX: REMOVAL OF STONES: SHX5545

## 2019-11-19 LAB — COMPREHENSIVE METABOLIC PANEL
ALT: 140 U/L — ABNORMAL HIGH (ref 0–44)
AST: 109 U/L — ABNORMAL HIGH (ref 15–41)
Albumin: 3 g/dL — ABNORMAL LOW (ref 3.5–5.0)
Alkaline Phosphatase: 86 U/L (ref 38–126)
Anion gap: 16 — ABNORMAL HIGH (ref 5–15)
BUN: 25 mg/dL — ABNORMAL HIGH (ref 8–23)
CO2: 24 mmol/L (ref 22–32)
Calcium: 9 mg/dL (ref 8.9–10.3)
Chloride: 99 mmol/L (ref 98–111)
Creatinine, Ser: 1.35 mg/dL — ABNORMAL HIGH (ref 0.61–1.24)
GFR calc Af Amer: 52 mL/min — ABNORMAL LOW (ref >60)
GFR calc non Af Amer: 45 mL/min — ABNORMAL LOW (ref >60)
Glucose, Bld: 225 mg/dL — ABNORMAL HIGH (ref 70–99)
Potassium: 3.7 mmol/L (ref 3.5–5.1)
Sodium: 139 mmol/L (ref 135–145)
Total Bilirubin: 5 mg/dL — ABNORMAL HIGH (ref 0.3–1.2)
Total Protein: 7.2 g/dL (ref 6.5–8.1)

## 2019-11-19 LAB — CBC
HCT: 44.4 % (ref 39.0–52.0)
Hemoglobin: 14 g/dL (ref 13.0–17.0)
MCH: 28.6 pg (ref 26.0–34.0)
MCHC: 31.5 g/dL (ref 30.0–36.0)
MCV: 90.8 fL (ref 80.0–100.0)
Platelets: 162 10*3/uL (ref 150–400)
RBC: 4.89 MIL/uL (ref 4.22–5.81)
RDW: 14.3 % (ref 11.5–15.5)
WBC: 26.4 10*3/uL — ABNORMAL HIGH (ref 4.0–10.5)
nRBC: 0 % (ref 0.0–0.2)

## 2019-11-19 LAB — GLUCOSE, CAPILLARY
Glucose-Capillary: 134 mg/dL — ABNORMAL HIGH (ref 70–99)
Glucose-Capillary: 153 mg/dL — ABNORMAL HIGH (ref 70–99)
Glucose-Capillary: 154 mg/dL — ABNORMAL HIGH (ref 70–99)
Glucose-Capillary: 132 mg/dL — ABNORMAL HIGH (ref 70–99)
Glucose-Capillary: 120 mg/dL — ABNORMAL HIGH (ref 70–99)

## 2019-11-19 LAB — TSH: TSH: 2.818 u[IU]/mL (ref 0.350–4.500)

## 2019-11-19 LAB — PROCALCITONIN: Procalcitonin: 7.68 ng/mL

## 2019-11-19 LAB — PROTIME-INR
INR: 1.5 — ABNORMAL HIGH (ref 0.8–1.2)
Prothrombin Time: 17.6 seconds — ABNORMAL HIGH (ref 11.4–15.2)

## 2019-11-19 LAB — HEMOGLOBIN A1C
Hgb A1c MFr Bld: 6.8 % — ABNORMAL HIGH (ref 4.8–5.6)
Mean Plasma Glucose: 148.46 mg/dL

## 2019-11-19 LAB — TROPONIN I (HIGH SENSITIVITY)
Troponin I (High Sensitivity): 30 ng/L — ABNORMAL HIGH (ref <18)
Troponin I (High Sensitivity): 26 ng/L — ABNORMAL HIGH (ref <18)

## 2019-11-19 LAB — APTT: aPTT: 40 seconds — ABNORMAL HIGH (ref 24–36)

## 2019-11-19 SURGERY — ERCP, WITH INTERVENTION IF INDICATED
Anesthesia: General

## 2019-11-19 MED ORDER — ONDANSETRON HCL 4 MG/2ML IJ SOLN
INTRAMUSCULAR | Status: DC | PRN
  Administered 2019-11-19: 4 mg via INTRAVENOUS

## 2019-11-19 MED ORDER — TAMSULOSIN HCL 0.4 MG PO CAPS
0.4000 mg | ORAL_CAPSULE | Freq: Every evening | ORAL | Status: DC
  Administered 2019-11-19 – 2019-11-25 (×7): 0.4 mg via ORAL
  Filled 2019-11-19 (×7): qty 1

## 2019-11-19 MED ORDER — IPRATROPIUM-ALBUTEROL 0.5-2.5 (3) MG/3ML IN SOLN
3.0000 mL | Freq: Four times a day (QID) | RESPIRATORY_TRACT | Status: DC
  Administered 2019-11-20 – 2019-11-22 (×10): 3 mL via RESPIRATORY_TRACT
  Filled 2019-11-19 (×10): qty 3

## 2019-11-19 MED ORDER — CARVEDILOL 3.125 MG PO TABS
3.1250 mg | ORAL_TABLET | Freq: Two times a day (BID) | ORAL | Status: DC
  Administered 2019-11-19 – 2019-11-26 (×15): 3.125 mg via ORAL
  Filled 2019-11-19 (×16): qty 1

## 2019-11-19 MED ORDER — LACTATED RINGERS IV SOLN: INTRAVENOUS | Status: DC | PRN

## 2019-11-19 MED ORDER — SODIUM CHLORIDE 0.9 % IV SOLN
INTRAVENOUS | Status: DC | PRN
  Administered 2019-11-19: 13:00:00 20 mL

## 2019-11-19 MED ORDER — FENTANYL CITRATE (PF) 250 MCG/5ML IJ SOLN
INTRAMUSCULAR | Status: DC | PRN
  Administered 2019-11-19: 50 ug via INTRAVENOUS

## 2019-11-19 MED ORDER — LEVOTHYROXINE SODIUM 112 MCG PO TABS
112.0000 ug | ORAL_TABLET | Freq: Every day | ORAL | Status: DC
  Administered 2019-11-19 – 2019-11-26 (×8): 112 ug via ORAL
  Filled 2019-11-19 (×8): qty 1

## 2019-11-19 MED ORDER — ROCURONIUM BROMIDE 10 MG/ML (PF) SYRINGE
PREFILLED_SYRINGE | INTRAVENOUS | Status: DC | PRN
  Administered 2019-11-19: 60 mg via INTRAVENOUS

## 2019-11-19 MED ORDER — PROPOFOL 10 MG/ML IV BOLUS
INTRAVENOUS | Status: DC | PRN
  Administered 2019-11-19: 130 mg via INTRAVENOUS

## 2019-11-19 MED ORDER — INSULIN ASPART 100 UNIT/ML ~~LOC~~ SOLN
0.0000 [IU] | Freq: Three times a day (TID) | SUBCUTANEOUS | Status: DC
  Administered 2019-11-19 (×2): 2 [IU] via SUBCUTANEOUS
  Administered 2019-11-19 – 2019-11-20 (×2): 3 [IU] via SUBCUTANEOUS
  Administered 2019-11-20: 12:00:00 2 [IU] via SUBCUTANEOUS
  Administered 2019-11-21: 5 [IU] via SUBCUTANEOUS
  Administered 2019-11-21: 8 [IU] via SUBCUTANEOUS
  Administered 2019-11-21 – 2019-11-23 (×5): 5 [IU] via SUBCUTANEOUS
  Administered 2019-11-23 (×2): 3 [IU] via SUBCUTANEOUS
  Administered 2019-11-24 (×3): 5 [IU] via SUBCUTANEOUS
  Administered 2019-11-25: 3 [IU] via SUBCUTANEOUS
  Administered 2019-11-25: 5 [IU] via SUBCUTANEOUS
  Administered 2019-11-25 – 2019-11-26 (×3): 3 [IU] via SUBCUTANEOUS

## 2019-11-19 MED ORDER — SUGAMMADEX SODIUM 200 MG/2ML IV SOLN
INTRAVENOUS | Status: DC | PRN
  Administered 2019-11-19: 400 mg via INTRAVENOUS

## 2019-11-19 MED ORDER — LIDOCAINE 2% (20 MG/ML) 5 ML SYRINGE
INTRAMUSCULAR | Status: DC | PRN
  Administered 2019-11-19: 60 mg via INTRAVENOUS

## 2019-11-19 MED ORDER — FUROSEMIDE 10 MG/ML IJ SOLN: 20.0000 mg | Freq: Once | INTRAMUSCULAR | Status: DC

## 2019-11-19 MED ORDER — INSULIN ASPART 100 UNIT/ML ~~LOC~~ SOLN
0.0000 [IU] | Freq: Every day | SUBCUTANEOUS | Status: DC
  Administered 2019-11-20 – 2019-11-22 (×3): 2 [IU] via SUBCUTANEOUS

## 2019-11-19 MED ORDER — IPRATROPIUM-ALBUTEROL 0.5-2.5 (3) MG/3ML IN SOLN
3.0000 mL | Freq: Four times a day (QID) | RESPIRATORY_TRACT | Status: DC
  Administered 2019-11-19 (×2): 3 mL via RESPIRATORY_TRACT
  Filled 2019-11-19 (×2): qty 3

## 2019-11-19 MED ORDER — GUAIFENESIN ER 600 MG PO TB12
600.0000 mg | ORAL_TABLET | Freq: Two times a day (BID) | ORAL | Status: DC
  Administered 2019-11-19 – 2019-11-26 (×14): 600 mg via ORAL
  Filled 2019-11-19 (×14): qty 1

## 2019-11-19 MED ORDER — INDOMETHACIN 50 MG RE SUPP
100.0000 mg | Freq: Once | RECTAL | Status: DC
  Filled 2019-11-19 (×2): qty 2

## 2019-11-19 MED ORDER — INDOMETHACIN 50 MG RE SUPP
RECTAL | Status: AC
  Filled 2019-11-19: qty 2

## 2019-11-19 MED ORDER — INDOMETHACIN 50 MG RE SUPP
RECTAL | Status: DC | PRN
  Administered 2019-11-19: 100 mg via RECTAL

## 2019-11-19 MED ORDER — MORPHINE SULFATE (PF) 2 MG/ML IV SOLN: 1.0000 mg | INTRAVENOUS | Status: DC | PRN

## 2019-11-19 MED ORDER — ALBUMIN HUMAN 5 % IV SOLN: INTRAVENOUS | Status: DC | PRN

## 2019-11-19 MED ORDER — IPRATROPIUM BROMIDE 0.06 % NA SOLN
2.0000 | Freq: Two times a day (BID) | NASAL | Status: DC
  Filled 2019-11-19: qty 15

## 2019-11-19 MED ORDER — PIPERACILLIN-TAZOBACTAM 3.375 G IVPB
3.3750 g | Freq: Three times a day (TID) | INTRAVENOUS | Status: DC
  Administered 2019-11-19 – 2019-11-21 (×9): 3.375 g via INTRAVENOUS
  Filled 2019-11-19 (×9): qty 50

## 2019-11-19 MED ORDER — PHENYLEPHRINE HCL-NACL 10-0.9 MG/250ML-% IV SOLN
INTRAVENOUS | Status: DC | PRN
  Administered 2019-11-19: 25 ug/min via INTRAVENOUS

## 2019-11-19 MED ORDER — DONEPEZIL HCL 5 MG PO TABS
5.0000 mg | ORAL_TABLET | Freq: Every day | ORAL | Status: DC
  Administered 2019-11-19 – 2019-11-26 (×8): 5 mg via ORAL
  Filled 2019-11-19 (×8): qty 1

## 2019-11-19 MED ORDER — PHENYLEPHRINE HCL (PRESSORS) 10 MG/ML IV SOLN
INTRAVENOUS | Status: DC | PRN
  Administered 2019-11-19 (×3): 80 ug via INTRAVENOUS

## 2019-11-19 MED ORDER — EPHEDRINE SULFATE 50 MG/ML IJ SOLN
INTRAMUSCULAR | Status: DC | PRN
  Administered 2019-11-19: 5 mg via INTRAVENOUS

## 2019-11-19 NOTE — Anesthesia Postprocedure Evaluation (Signed)
Anesthesia Post Note  Patient: David Morgan  Procedure(s) Performed: ENDOSCOPIC RETROGRADE CHOLANGIOPANCREATOGRAPHY (ERCP) (N/A ) SPHINCTEROTOMY REMOVAL OF STONES     Patient location during evaluation: Endoscopy Anesthesia Type: General Level of consciousness: awake and alert Pain management: pain level controlled Vital Signs Assessment: post-procedure vital signs reviewed and stable Respiratory status: spontaneous breathing, nonlabored ventilation, respiratory function stable and patient connected to nasal cannula oxygen Cardiovascular status: blood pressure returned to baseline and stable Postop Assessment: no apparent nausea or vomiting Anesthetic complications: no    Last Vitals:  Vitals:   11/19/19 2014 11/19/19 2025  BP:  (!) 98/55  Pulse:  72  Resp:  19  Temp:    SpO2: 97% 97%    Last Pain:  Vitals:   11/19/19 2001  TempSrc: Oral  PainSc:                  Marnisha Stampley COKER

## 2019-11-19 NOTE — Anesthesia Procedure Notes (Signed)
Procedure Name: Intubation Date/Time: 11/19/2019 1:49 PM Performed by: Clearnce Sorrel, CRNA Pre-anesthesia Checklist: Patient identified, Emergency Drugs available, Suction available, Patient being monitored and Timeout performed Patient Re-evaluated:Patient Re-evaluated prior to induction Oxygen Delivery Method: Circle system utilized Preoxygenation: Pre-oxygenation with 100% oxygen Induction Type: IV induction Ventilation: Oral airway inserted - appropriate to patient size and Two handed mask ventilation required Laryngoscope Size: Mac and 4 Grade View: Grade II Tube type: Oral Tube size: 7.5 mm Number of attempts: 1 Airway Equipment and Method: Stylet Placement Confirmation: positive ETCO2 and breath sounds checked- equal and bilateral Secured at: 23 cm Tube secured with: Tape Dental Injury: Teeth and Oropharynx as per pre-operative assessment

## 2019-11-19 NOTE — Progress Notes (Signed)
Notified by CCMD , pt had 16 beat run SVT. Patient resting in bed. Notified on call

## 2019-11-19 NOTE — Op Note (Signed)
[facility] Patient Name: Theryn Cich Procedure Date : 11/19/2019 MRN: [id number] Attending MD: Gatha Mayer , MD Date of Birth: 10-07-1924 CSN: [id number] Age: 86 Admit Type: Inpatient Procedure:                ERCP Indications:              Bile duct stone(s) Providers:                Gatha Mayer, MD, Jeanella Cara, RN,                            Laverda Sorenson, Technician, Clearnce Sorrel, CRNA Referring MD:              Medicines:                General Anesthesia, Indomethacin 100 mg PR, Zosyn Complications:            No immediate complications. Estimated Blood Loss:     Estimated blood loss: none. Procedure:                Pre-Anesthesia Assessment:                           - Prior to the procedure, a History and Physical                            was performed, and patient medications and                            allergies were reviewed. The patient's tolerance of                            previous anesthesia was also reviewed. The risks                            and benefits of the procedure and the sedation                            options and risks were discussed with the patient.                            All questions were answered, and informed consent                            was obtained. Prior Anticoagulants: The patient has                            taken no previous anticoagulant or antiplatelet                            agents. ASA Grade Assessment: III - A patient with                            severe systemic disease. After reviewing the risks  and benefits, the patient was deemed in                            satisfactory condition to undergo the procedure.                           After obtaining informed consent, the scope was                            passed under direct vision. Throughout the                            procedure, the patient's blood pressure, pulse, and           oxygen saturations were monitored continuously. The                            [id number] Olympus Duodensocope was                            introduced through the mouth, and used to inject                            contrast into and used to inject contrast into the                            bile duct. The ERCP was accomplished without                            difficulty. The patient tolerated the procedure                            well. Scope In: Scope Out: Findings:      The scout film was normal. Esophagus not seen well. Stomach grossly       normal.      Small papilla in duodenum - orifice open and friable, periampullary       diverticulum.      Cannulated with wire in sphincterotome - first few times into pancreatic       duct - wire left in pancreatic duct and second wire used to cannulate       bile duct. Pancreatic wire removed.      Contrast injected - 6-7 mm CBD, one filling defect seen, 3-4 mm. In CBD.       Biliary sphincterotomy performed over wire.      Balloon sweep to remove a black pigmented ovoid -flat stone - 4 mm or so.      Other sweeps and occlusion cholangiogram negative. Gallbladder filled,       TNTC gallbladder stones. No pancreatogram, intentionally. Impression:               - Choledocholithiasis without cholecystitis and                            with a partial obstruction was found. Complete  removal was accomplished by biliary sphincterotomy                            and balloon extraction. I suspect at [facility] one                            stone had passed prior to ERCP given appearance of                            the papilla. Recommendation:           - Return patient to hospital ward for ongoing care.                           - Does not seem to have cholecystitis or                            cholangitis but WBC 25 K so would leave on Zosyn                           Consult surgery re:  cholelithiasis                           Labs in AM                           Clears only today                           Wife updated by phone Procedure Code(s):        --- Professional ---                           [phone], Endoscopic retrograde                            cholangiopancreatography (ERCP); with removal of                            calculi/debris from biliary/pancreatic duct(s)                           [id number], Endoscopic retrograde                            cholangiopancreatography (ERCP); with                            sphincterotomy/papillotomy Diagnosis Code(s):        --- Professional ---                           K80.51, Calculus of bile duct without cholangitis                            or cholecystitis with obstruction CPT copyright 2019 [employer]. All rights reserved. The codes documented in this report are preliminary  and upon coder review may  be revised to meet current compliance requirements. Gatha Mayer, MD 11/19/2019 2:45:25 PM This report has been signed electronically. Number of Addenda: 0

## 2019-11-19 NOTE — Anesthesia Preprocedure Evaluation (Signed)
Anesthesia Evaluation  Patient identified by MRN, date of birth, ID band Patient confused    Reviewed: Allergy & Precautions, Patient's Chart, lab work & pertinent test results, Unable to perform ROS - Chart review only  Airway Mallampati: III  TM Distance: >3 FB     Dental  (+) Teeth Intact, Dental Advisory Given   Pulmonary former smoker,     + decreased breath sounds      Cardiovascular hypertension,  Rhythm:Irregular Rate:Normal     Neuro/Psych    GI/Hepatic   Endo/Other    Renal/GU      Musculoskeletal   Abdominal   Peds  Hematology   Anesthesia Other Findings   Reproductive/Obstetrics                             Anesthesia Physical Anesthesia Plan  ASA: III  Anesthesia Plan: General   Post-op Pain Management:    Induction: Intravenous  PONV Risk Score and Plan: Ondansetron  Airway Management Planned: Oral ETT  Additional Equipment:   Intra-op Plan:   Post-operative Plan: Extubation in OR  Informed Consent: I have reviewed the patients History and Physical, chart, labs and discussed the procedure including the risks, benefits and alternatives for the proposed anesthesia with the patient or authorized representative who has indicated his/her understanding and acceptance.     Dental advisory given  Plan Discussed with: CRNA and Anesthesiologist  Anesthesia Plan Comments:         Anesthesia Quick Evaluation

## 2019-11-19 NOTE — Consult Note (Addendum)
Referring Provider:  [facility]         Primary Care Physician:  [name], MD Primary Gastroenterologist:   Althia Forts           We were asked to see this patient for:    Common bile duct stone              ASSESSMENT /  PLAN   86 yo male with pmh significant for, not necessarily limited to HTN, hyperlipidemia, COPD, TIA, DM, hypothyroidism, bladder cancer, inguinal hernia repair, diverticulitis, and dementia.   # Abdominal pain / abnormal liver chemistries / MRCP concerning for choledocholithiasis.  --Will need ERCP for further evaluation / stone removal. Patient has dementia and is extremely hard of hearing. I spoke to wife over the telephone and explained the procedure and related risks / benefits of the procedure. She agrees to proceed.  --Will reassess respiratory status prior to proceeding ( wheezing, breathing slightly labored but sats okay on 2 L) --Continue Zosyn --Am CBC, liver tests  # COPD --He is wheezing on exam. Breathing slightly labored but 02 sats okay on 2 L / [place] --Respiratory Tx coming to give breathing treatment  # Mild coagulopathy --INR 1.5  # Leukocytosis, 26K --Afebrile --? All secondary to above? Blood cultures negative thus far --On Zosyn  # Elevated Troponin --Troponin 30>> 26 --? Possibly from demand. Denies chest pain  # AKI --Cr 1.35  # DM --Glucose 225 this am      [facility] Attending   I have taken an interval history, reviewed the chart and examined the patient. I agree with the Advanced Practitioner's note, impression and recommendations.   The MRCP raises ? Of CBD stones so ERCP is appropriate. Reviewed with wife  Gatha Mayer, MD, [facility] 11/19/2019 1:15 PM     HPI:    Chief Complaint: abdominal pain  David Morgan is a 86 y.o. male who presented to  [facility] ED yesterday after a fall. He has dementia but apparently has been more confused at home lately. He  complained of abdominal pain which led to imaging which was concerning for choledocholithiasis. Cholelithiasis and a 7 mm distal CBD abnormality was seen on MRCP. He was transferred here for possible ERCP. His liver tests are abnormal ( mixed pattern). Transaminases improved overnight but Tbili rose slightly from 4.6 to 5. Alk phos is normal. Normal lipase.  Patient unable to give history gives as he has dementia and is extremely HOH. He does report generalized abdominal pain over the last few days. Says he has never had this type of pain before. Endorses associated N/V.   Past Medical History:  Diagnosis Date  . Arthritis   . Cancer ([place])   . Claudication ([place])   . Diverticulitis   . Emphysema/COPD ([place])   . History of bladder cancer   . History of carotid artery stenosis   . History of TIA (transient ischemic attack)    2012--  NO RESIDUAL (PER SCAN HAD A PREVIOUS TIA BEFORE 2012)  . Hyperlipidemia   . Hypertension   . Hypothyroidism   . Lesion of bladder   . Mild asthma    NO INHALER  . Nocturia   . Peripheral vascular disease ([place])   . Wears glasses     Past Surgical History:  Procedure Laterality Date  . CAROTID ENDARTERECTOMY Right 2005  . CARPAL TUNNEL RELEASE Bilateral 2002  &  2007  . CATARACT EXTRACTION W/ INTRAOCULAR LENS  IMPLANT, BILATERAL    . CYSTOSCOPY W/ RETROGRADES Bilateral 12/26/2013   Procedure: BILATERAL RETROGRADE PYELOGRAM;  Surgeon: Claybon Jabs, MD;  Location: [facility];  Service: Urology;  Laterality: Bilateral;  . CYSTOSCOPY WITH BIOPSY N/A 12/26/2013   Procedure: CYSTOSCOPY WITH BLADDER BIOPSY;  Surgeon: Claybon Jabs, MD;  Location[facility];  Service: Urology;  Laterality: N/A;  . INGUINAL HERNIA REPAIR  YRS AGO  . LAPAROSCOPIC LYSIS OF ADHESIONS  06/28/2015   Procedure: LAPAROSCOPIC LYSIS OF ADHESIONS;  Surgeon: Clayburn Pert, MD;  Location: [facility];  Service: General;;  . LAPAROSCOPY  06/28/2015   Procedure:  LAPAROSCOPY DIAGNOSTIC;  Surgeon: Clayburn Pert, MD;  Location: [facility];  Service: General;;  . LAPAROTOMY N/A 09/03/2015   Procedure: umbilical hernia repair with mesh;  Surgeon: Hubbard Robinson, MD;  Location: [facility];  Service: General;  Laterality: N/A;  . ORIF HIP FRACTURE Left 2008   RETAINED HARDWARE  . RIGHT SHOULDER  SURGERY  2005  . TOTAL KNEE ARTHROPLASTY Right 2004  . TRANSURETHRAL RESECTION OF BLADDER TUMOR  1990  . UMBILICAL HERNIA REPAIR   2009  &  2011    Prior to Admission medications   Medication Sig Start Date End Date Taking? Authorizing Provider  acetaminophen (TYLENOL) 500 MG tablet Take 2 tablets (1,000 mg total) by mouth every 6 (six) hours as needed. 09/21/15   Hubbard Robinson, MD  carvedilol (COREG) 3.125 MG tablet Take 3.125 mg by mouth 2 (two) times daily with a meal.    [provider]  donepezil (ARICEPT) 5 MG tablet Take 5 mg by mouth daily. 09/30/19   [provider]  hydrochlorothiazide (HYDRODIURIL) 12.5 MG tablet Take 12.5 mg by mouth daily. 09/16/19   [provider]  Hydrocodone-Acetaminophen (VICODIN) 5-300 MG TABS Take 1 tablet by mouth every 4 (four) hours as needed. Patient not taking: Reported on 12/21/2017 09/21/15   Hubbard Robinson, MD  ipratropium (ATROVENT) 0.03 % nasal spray Place 2 sprays into both nostrils 2 (two) times daily. 09/15/19   [provider]  ketoconazole (NIZORAL) 2 % cream Apply  a small amount to affected area once a day  to scalp 10/27/19   [provider]  levothyroxine (SYNTHROID, LEVOTHROID) 112 MCG tablet Take 112 mcg by mouth daily before breakfast.    [provider]  losartan (COZAAR) 50 MG tablet Take 50 mg by mouth daily.  06/26/15   [provider]  metFORMIN (GLUCOPHAGE) 500 MG tablet Take 500 mg by mouth daily. 09/16/19   [provider]  Multiple Vitamin (MULTI-VITAMINS) TABS Take 1 tablet by mouth daily.    [provider]    mupirocin ointment (BACTROBAN) 2 % SMARTSIG:1 Application Topical 2-3 Times Daily 09/20/19   [provider]  ondansetron (ZOFRAN) 4 MG tablet Take 1 tablet (4 mg total) by mouth every 6 (six) hours as needed for nausea. Patient not taking: Reported on 12/21/2017 08/10/15   Sherri Rad, MD  tamsulosin (FLOMAX) 0.4 MG CAPS capsule Take 0.4 mg by mouth every evening.     [provider]    Current Facility-Administered Medications  Medication Dose Route Frequency Provider Last Rate Last Admin  . carvedilol (COREG) tablet 3.125 mg  3.125 mg Oral BID WC Adefeso, [place], DO   3.125 mg at 11/19/19 0855  . donepezil (ARICEPT) tablet 5 mg  5 mg Oral Daily Adefeso, Oladapo, DO   5 mg at 11/19/19 0854  . insulin aspart (  novoLOG) injection 0-15 Units  0-15 Units Subcutaneous TID WC Adefeso, Oladapo, DO   2 Units at 11/19/19 0853  . insulin aspart (novoLOG) injection 0-5 Units  0-5 Units Subcutaneous QHS Adefeso, Oladapo, DO      . ipratropium (ATROVENT) 0.06 % nasal spray 2 spray  2 spray Each Nare BID Adefeso, Oladapo, DO      . levothyroxine (SYNTHROID) tablet 112 mcg  112 mcg Oral Q0600 Adefeso, Oladapo, DO   112 mcg at 11/19/19 0854  . morphine 2 MG/ML injection 1 mg  1 mg Intravenous Q4H PRN Adefeso, Oladapo, DO      . piperacillin-tazobactam (ZOSYN) IVPB 3.375 g  3.375 g Intravenous Q8H KoAltha Harm, RPH 12.5 mL/hr at 11/19/19 0847 3.375 g at 11/19/19 0847  . tamsulosin (FLOMAX) capsule 0.4 mg  0.4 mg Oral QPM Adefeso, Oladapo, DO        Allergies as of 11/18/2019 - Review Complete 11/18/2019  Allergen Reaction Noted  . Adhesive [tape] Rash 12/21/2013  . Augmentin [amoxicillin-pot clavulanate] Itching, Rash, and Other (See Comments) 12/21/2013    Family History  Problem Relation Age of Onset  . Hypertension Father   . Alzheimer's disease Mother     Social History   Socioeconomic History  . Marital status: Married    Spouse name: Not on file  . Number of children: Not  on file  . Years of education: Not on file  . Highest education level: Not on file  Occupational History  . Not on file  Tobacco Use  . Smoking status: Former Smoker    Packs/day: 1.00    Years: 40.00    Pack years: 40.00    Types: Cigarettes    Quit date: 12/21/1993    Years since quitting: 25.9  . Smokeless tobacco: Never Used  Substance and Sexual Activity  . Alcohol use: No    Comment: RARE  . Drug use: No  . Sexual activity: Never  Other Topics Concern  . Not on file  Social History Narrative  . Not on file   Social Determinants of Health   Financial Resource Strain:   . Difficulty of Paying Living Expenses:   Food Insecurity:   . Worried About Charity fundraiser in the Last Year:   . Arboriculturist in the Last Year:   Transportation Needs:   . Film/video editor (Medical):   [name] Lack of Transportation (Non-Medical):   Physical Activity:   . Days of Exercise per Week:   . Minutes of Exercise per Session:   Stress:   . Feeling of Stress :   Social Connections:   . Frequency of Communication with Friends and Family:   . Frequency of Social Gatherings with Friends and Family:   . Attends Religious Services:   . Active Member of Clubs or Organizations:   . Attends Archivist Meetings:   [name] Marital Status:   Intimate Partner Violence:   . Fear of Current or Ex-Partner:   . Emotionally Abused:   [name] Physically Abused:   . Sexually Abused:     Review of Systems: Unobtainable.   Physical Exam: Vital signs in last 24 hours: Temp:  [97.5 F (36.4 C)-99.1 F (37.3 C)] 98.6 F (37 C) (04/03 0754) Pulse Rate:  [81-100] 95 (04/03 0854) Resp:  [18-30] 30 (04/03 0754) BP: (116-172)/(56-89) 116/66 (04/03 0854) SpO2:  [92 %-98 %] 97 % (04/03 0754) Weight:  [104 kg] 104 kg (04/02 2126)   General:  Alert, obese male in NAD.  Psych:  Pleasant, cooperative.  Eyes:  Pupils equal, mild icterus Ears:  HOH Nose:  No deformity, discharge,  or  lesions. Neck:  Supple; no masses Lungs:  Wheezing throughout both lungs. Respirations ~ 22.   Heart:  Regular rate and rhythm; trace BLE edema Abdomen:  Soft, protuberant, generalized mild tenderness, BS active, Rectal:  Deferred  Neurologic:  Alert, aware at the hospital. Answers questions appropriately Skin:  Intact without significant lesions or rashes.   Intake/Output from previous day: No intake/output data recorded. Intake/Output this shift: No intake/output data recorded.  Lab Results: Recent Labs    11/18/19 1138 11/19/19 0255  WBC 23.5* 26.4*  HGB 14.6 14.0  HCT 45.4 44.4  PLT 175 162   BMET Recent Labs    11/18/19 1138 11/19/19 0255  NA 134* 139  K 4.5 3.7  CL 97* 99  CO2 23 24  GLUCOSE 386* 225*  BUN 28* 25*  CREATININE 1.34* 1.35*  CALCIUM 9.1 9.0   LFT Recent Labs    11/19/19 0255  PROT 7.2  ALBUMIN 3.0*  AST 109*  ALT 140*  ALKPHOS 86  BILITOT 5.0*   PT/INR Recent Labs    11/19/19 0255  LABPROT 17.6*  INR 1.5*   Hepatitis Panel No results for input(s): HEPBSAG, HCVAB, HEPAIGM, HEPBIGM in the last 72 hours.   . CBC Latest Ref Rng & Units 11/19/2019 11/18/2019 12/22/2017  WBC 4.0 - 10.5 K/uL 26.4(H) 23.5(H) 8.1  Hemoglobin 13.0 - 17.0 g/dL 14.0 14.6 16.6  Hematocrit 39.0 - 52.0 % 44.4 45.4 49.3  Platelets 150 - 400 K/uL 162 175 193    . CMP Latest Ref Rng & Units 11/19/2019 11/18/2019 12/22/2017  Glucose 70 - 99 mg/dL 225(H) 386(H) 135(H)  BUN 8 - 23 mg/dL 25(H) 28(H) 12  Creatinine 0.61 - 1.24 mg/dL 1.35(H) 1.34(H) 0.74  Sodium 135 - 145 mmol/L 139 134(L) 137  Potassium 3.5 - 5.1 mmol/L 3.7 4.5 3.5  Chloride 98 - 111 mmol/L 99 97(L) 100(L)  CO2 22 - 32 mmol/L 24 23 27   Calcium 8.9 - 10.3 mg/dL 9.0 9.1 8.7(L)  Total Protein 6.5 - 8.1 g/dL 7.2 8.3(H) -  Total Bilirubin 0.3 - 1.2 mg/dL 5.0(H) 4.6(H) -  Alkaline Phos 38 - 126 U/L 86 109 -  AST 15 - 41 U/L 109(H) 192(H) -  ALT 0 - 44 U/L 140(H) 174(H) -   Studies/Results: CT Head Wo  Contrast  Result Date: 11/18/2019 CLINICAL DATA:  [place] at home yesterday. [place] again this morning. Confusion. EXAM: CT HEAD WITHOUT CONTRAST CT CERVICAL SPINE WITHOUT CONTRAST TECHNIQUE: Multidetector CT imaging of the head and cervical spine was performed following the standard protocol without intravenous contrast. Multiplanar CT image reconstructions of the cervical spine were also generated. COMPARISON:  Head CT 12/21/2017 FINDINGS: CT HEAD FINDINGS Brain: Stable age related significant cerebral atrophy, ventriculomegaly and periventricular white matter disease. No extra-axial fluid collections are identified. No CT findings for acute hemispheric infarction or intracranial hemorrhage. No mass lesions. The brainstem and cerebellum are normal. Vascular: Stable vascular calcifications. No hyperdense vessels or obvious aneurysm. Skull: No skull fracture or bone lesions. Sinuses/Orbits: The paranasal sinuses and mastoid air cells are clear. The globes are intact. Other: No scalp lesions or obvious laceration or foreign body. CT CERVICAL SPINE FINDINGS Alignment: Normal overall alignment. Mild degenerative anterior subluxation of C7 compared to T1. The facet joints are fused at this disc space level. Skull base and vertebrae: No  acute fracture. No primary bone lesion or focal pathologic process. Soft tissues and spinal canal: No prevertebral fluid or swelling. No visible canal hematoma. Disc levels: The spinal canal is fairly generous. No significant spinal stenosis. Multilevel facet disease and mild uncinate spurring changes contributing to mild multilevel bony foraminal narrowing. Upper chest: The lung apices are grossly clear. Other: No neck mass or adenopathy. Carotid artery calcifications are noted. IMPRESSION: 1. Stable age related cerebral atrophy, ventriculomegaly and periventricular white matter disease. 2. No acute intracranial findings or skull fracture. 3. Degenerative cervical spondylosis with multilevel  disc disease, facet disease and uncinate spurring changes but no acute cervical spine fracture. Electronically Signed   By: Marijo Sanes M.D.   On: 11/18/2019 13:19   CT Angio Chest PE W and/or Wo Contrast  Result Date: 11/18/2019 CLINICAL DATA:  86 year old male with abdominal trauma EXAM: CT ANGIOGRAPHY CHEST CT ABDOMEN AND PELVIS WITH CONTRAST TECHNIQUE: Multidetector CT imaging of the chest was performed using the standard protocol during bolus administration of intravenous contrast. Multiplanar CT image reconstructions and MIPs were obtained to evaluate the vascular anatomy. Multidetector CT imaging of the abdomen and pelvis was performed using the standard protocol during bolus administration of intravenous contrast. CONTRAST:  57m OMNIPAQUE IOHEXOL 350 MG/ML SOLN COMPARISON:  09/18/2015 FINDINGS: CTA CHEST FINDINGS Cardiovascular: Heart: No cardiomegaly. No pericardial fluid/thickening. Calcifications of left main, left anterior descending, circumflex, right coronary arteries. Aorta: Calcifications of the aortic valve. Unremarkable course caliber and contour of the thoracic aorta. Moderate atherosclerosis of the aorta including some irregular intimal plaque of the sending thoracic aorta. No ulcerated plaque or evidence of dissection. Pulmonary arteries: No central, lobar, segmental, or proximal subsegmental filling defects. Mediastinum/Nodes: Multiple small mediastinal lymph nodes throughout all nodal stations. Unremarkable appearance of the thoracic esophagus. Unremarkable thoracic inlet Lungs/Pleura: Centrilobular and paraseptal emphysema. Respiratory motion limits evaluation for small nodules. No pneumothorax or pleural effusion. No confluent airspace disease. Review of the MIP images confirms the above findings. CT ABDOMEN and PELVIS FINDINGS Hepatobiliary: Unremarkable liver. Gallbladder is distended with radiopaque stones in the neck of the gallbladder. No significant pericholecystic fluid. Mild  inflammatory changes of the gallbladder wall and the adjacent fat. No definite radiopaque stones identified in the region of the common bile duct. Pancreas: Unremarkable Spleen: Unremarkable Adrenals/Urinary Tract: - Right adrenal gland:  Unremarkable - Left adrenal gland: Unremarkable. - Right kidney: No hydronephrosis, nephrolithiasis, inflammation, or ureteral dilation. Hypodense lesion on the posterior cortex of the right kidney is too small to characterize. Hyperdense focal lesion on the lateral cortex of the right kidney was present on the comparison CT and most likely represents a complex cyst given the decreasing size though is strictly too small to characterize. - Left Kidney: No hydronephrosis, nephrolithiasis, inflammation, or ureteral dilation. No focal lesion. - Urinary Bladder: Urinary bladder relatively decompressed. Stomach/Bowel: - Stomach: Small hiatal hernia.  Otherwise unremarkable stomach. - Small bowel: Unremarkable - Appendix: Normal. - Colon: Colonic diverticula of the sigmoid colon and the left colon. No focal inflammatory changes. No bowel obstruction. Mild stool burden. Vascular/Lymphatic: Atherosclerosis of the abdominal aorta. No periaortic fluid or inflammatory changes. Greatest diameter of the aorta is estimated 3.4 cm. Irregular plaque versus chronic dissection just above the bifurcation. Atherosclerotic changes of the bilateral iliac arteries. Bilateral iliac arteries are patent. Proximal femoral arteries are patent. Reproductive: Calcifications of the prostate. Other: Fat containing umbilical hernia. Musculoskeletal: Osteopenia. Degenerative changes of the visualized spine no acute displaced vertebral body fracture. Kyphotic deformity of the  thoracic spine likely degenerative. No bony canal narrowing.  No aggressive bony lesions identified. Treatment changes of prior vertebral augmentation of L3. Surgical changes of prior left femur repair. Review of the MIP images confirms the  above findings. IMPRESSION: No acute finding of the chest CT angiogram. Cholelithiasis with distended gallbladder and vague inflammatory changes of the gallbladder wall. These findings may reflect early acute cholecystitis. Correlation with lab values and patient presentation may be useful, as well as consideration of confirmatory HIDA study. Ultrasound will likely yield no further useful imaging information. Emphysema (ICD10-J43.9). Aortic Atherosclerosis (ICD10-I70.0). Associated coronary artery disease, bilateral iliac arterial disease, irregular plaque of the abdominal aorta. Diverticular disease without evidence of acute diverticulitis. Additional ancillary findings as above. Electronically Signed   By: Corrie Mckusick D.O.   On: 11/18/2019 13:26   CT Cervical Spine Wo Contrast  Result Date: 11/18/2019 CLINICAL DATA:  [place] at home yesterday. [place] again this morning. Confusion. EXAM: CT HEAD WITHOUT CONTRAST CT CERVICAL SPINE WITHOUT CONTRAST TECHNIQUE: Multidetector CT imaging of the head and cervical spine was performed following the standard protocol without intravenous contrast. Multiplanar CT image reconstructions of the cervical spine were also generated. COMPARISON:  Head CT 12/21/2017 FINDINGS: CT HEAD FINDINGS Brain: Stable age related significant cerebral atrophy, ventriculomegaly and periventricular white matter disease. No extra-axial fluid collections are identified. No CT findings for acute hemispheric infarction or intracranial hemorrhage. No mass lesions. The brainstem and cerebellum are normal. Vascular: Stable vascular calcifications. No hyperdense vessels or obvious aneurysm. Skull: No skull fracture or bone lesions. Sinuses/Orbits: The paranasal sinuses and mastoid air cells are clear. The globes are intact. Other: No scalp lesions or obvious laceration or foreign body. CT CERVICAL SPINE FINDINGS Alignment: Normal overall alignment. Mild degenerative anterior subluxation of C7 compared to  T1. The facet joints are fused at this disc space level. Skull base and vertebrae: No acute fracture. No primary bone lesion or focal pathologic process. Soft tissues and spinal canal: No prevertebral fluid or swelling. No visible canal hematoma. Disc levels: The spinal canal is fairly generous. No significant spinal stenosis. Multilevel facet disease and mild uncinate spurring changes contributing to mild multilevel bony foraminal narrowing. Upper chest: The lung apices are grossly clear. Other: No neck mass or adenopathy. Carotid artery calcifications are noted. IMPRESSION: 1. Stable age related cerebral atrophy, ventriculomegaly and periventricular white matter disease. 2. No acute intracranial findings or skull fracture. 3. Degenerative cervical spondylosis with multilevel disc disease, facet disease and uncinate spurring changes but no acute cervical spine fracture. Electronically Signed   By: Marijo Sanes M.D.   On: 11/18/2019 13:19   CT ABDOMEN PELVIS W CONTRAST  Result Date: 11/18/2019 CLINICAL DATA:  86 year old male with abdominal trauma EXAM: CT ANGIOGRAPHY CHEST CT ABDOMEN AND PELVIS WITH CONTRAST TECHNIQUE: Multidetector CT imaging of the chest was performed using the standard protocol during bolus administration of intravenous contrast. Multiplanar CT image reconstructions and MIPs were obtained to evaluate the vascular anatomy. Multidetector CT imaging of the abdomen and pelvis was performed using the standard protocol during bolus administration of intravenous contrast. CONTRAST:  46m OMNIPAQUE IOHEXOL 350 MG/ML SOLN COMPARISON:  09/18/2015 FINDINGS: CTA CHEST FINDINGS Cardiovascular: Heart: No cardiomegaly. No pericardial fluid/thickening. Calcifications of left main, left anterior descending, circumflex, right coronary arteries. Aorta: Calcifications of the aortic valve. Unremarkable course caliber and contour of the thoracic aorta. Moderate atherosclerosis of the aorta including some  irregular intimal plaque of the sending thoracic aorta. No ulcerated plaque or evidence of dissection.  Pulmonary arteries: No central, lobar, segmental, or proximal subsegmental filling defects. Mediastinum/Nodes: Multiple small mediastinal lymph nodes throughout all nodal stations. Unremarkable appearance of the thoracic esophagus. Unremarkable thoracic inlet Lungs/Pleura: Centrilobular and paraseptal emphysema. Respiratory motion limits evaluation for small nodules. No pneumothorax or pleural effusion. No confluent airspace disease. Review of the MIP images confirms the above findings. CT ABDOMEN and PELVIS FINDINGS Hepatobiliary: Unremarkable liver. Gallbladder is distended with radiopaque stones in the neck of the gallbladder. No significant pericholecystic fluid. Mild inflammatory changes of the gallbladder wall and the adjacent fat. No definite radiopaque stones identified in the region of the common bile duct. Pancreas: Unremarkable Spleen: Unremarkable Adrenals/Urinary Tract: - Right adrenal gland:  Unremarkable - Left adrenal gland: Unremarkable. - Right kidney: No hydronephrosis, nephrolithiasis, inflammation, or ureteral dilation. Hypodense lesion on the posterior cortex of the right kidney is too small to characterize. Hyperdense focal lesion on the lateral cortex of the right kidney was present on the comparison CT and most likely represents a complex cyst given the decreasing size though is strictly too small to characterize. - Left Kidney: No hydronephrosis, nephrolithiasis, inflammation, or ureteral dilation. No focal lesion. - Urinary Bladder: Urinary bladder relatively decompressed. Stomach/Bowel: - Stomach: Small hiatal hernia.  Otherwise unremarkable stomach. - Small bowel: Unremarkable - Appendix: Normal. - Colon: Colonic diverticula of the sigmoid colon and the left colon. No focal inflammatory changes. No bowel obstruction. Mild stool burden. Vascular/Lymphatic: Atherosclerosis of the  abdominal aorta. No periaortic fluid or inflammatory changes. Greatest diameter of the aorta is estimated 3.4 cm. Irregular plaque versus chronic dissection just above the bifurcation. Atherosclerotic changes of the bilateral iliac arteries. Bilateral iliac arteries are patent. Proximal femoral arteries are patent. Reproductive: Calcifications of the prostate. Other: Fat containing umbilical hernia. Musculoskeletal: Osteopenia. Degenerative changes of the visualized spine no acute displaced vertebral body fracture. Kyphotic deformity of the thoracic spine likely degenerative. No bony canal narrowing.  No aggressive bony lesions identified. Treatment changes of prior vertebral augmentation of L3. Surgical changes of prior left femur repair. Review of the MIP images confirms the above findings. IMPRESSION: No acute finding of the chest CT angiogram. Cholelithiasis with distended gallbladder and vague inflammatory changes of the gallbladder wall. These findings may reflect early acute cholecystitis. Correlation with lab values and patient presentation may be useful, as well as consideration of confirmatory HIDA study. Ultrasound will likely yield no further useful imaging information. Emphysema (ICD10-J43.9). Aortic Atherosclerosis (ICD10-I70.0). Associated coronary artery disease, bilateral iliac arterial disease, irregular plaque of the abdominal aorta. Diverticular disease without evidence of acute diverticulitis. Additional ancillary findings as above. Electronically Signed   By: Corrie Mckusick D.O.   On: 11/18/2019 13:26   MR 3D Recon At Scanner  Result Date: 11/18/2019 CLINICAL DATA:  Cholelithiasis with elevated bilirubin. EXAM: MRI ABDOMEN WITHOUT AND WITH CONTRAST (INCLUDING MRCP) TECHNIQUE: Multiplanar multisequence MR imaging of the abdomen was performed both before and after the administration of intravenous contrast. Heavily T2-weighted images of the biliary and pancreatic ducts were obtained, and  three-dimensional MRCP images were rendered by post processing. CONTRAST:  38m GADAVIST GADOBUTROL 1 MMOL/ML IV SOLN COMPARISON:  CT scan 11/18/2019 FINDINGS: Motion degraded study due to patient inability to reproducibly breath hold. Lower chest: Unremarkable. Hepatobiliary: 8 mm cyst noted inferior right liver. Gallbladder is distended with layering tiny gallstones. Common duct measures 7 mm diameter. There is abnormal signal in the distal common bile duct. This is not well evaluated due to motion artifact and could represent sludge impacted in  the distal common bile duct although tiny layering/stacked gallstones cannot be excluded. Pancreas: Limited assessment due to motion degradation on postcontrast imaging. No focal mass lesion. No dilatation of the main duct. No intraparenchymal cyst. No peripancreatic edema. Spleen:  No gross abnormality. Adrenals/Urinary Tract: No adrenal nodule or mass. Bilateral renal cysts. Exophytic 10 mm complex cystic lesion in the interpolar right kidney (image 27/3 and image 330/18). This is not well evaluated due to motion degradation but comparing today's CT to a CT scan from 09/18/2015, this lesion has decreased in size in the interval consistent with benign etiology. Stomach/Bowel: Stomach is moderately distended. No small bowel or colonic dilatation within the visualized abdomen. Vascular/Lymphatic: Atherosclerotic changes noted in the abdominal aorta. No abdominal lymphadenopathy. Other:  No intraperitoneal free fluid. Musculoskeletal: No suspicious marrow enhancement within the visualized bony anatomy. IMPRESSION: 1. Motion degraded study due to patient inability to reproducibly breath hold. 2. There is abnormal signal in the 7 mm diameter distal common bile duct with loss of normal T2 hyperintensity. This is not well evaluated due to motion artifact and could represent sludge or stacked gallstones. No suspicious enhancement to suggest soft tissue mass, but follow-up likely  warranted. 3. Cholelithiasis. 4. Right renal cysts. Electronically Signed   By: Misty Stanley M.D.   On: 11/18/2019 16:04   MR ABDOMEN MRCP W WO CONTAST  Result Date: 11/18/2019 CLINICAL DATA:  Cholelithiasis with elevated bilirubin. EXAM: MRI ABDOMEN WITHOUT AND WITH CONTRAST (INCLUDING MRCP) TECHNIQUE: Multiplanar multisequence MR imaging of the abdomen was performed both before and after the administration of intravenous contrast. Heavily T2-weighted images of the biliary and pancreatic ducts were obtained, and three-dimensional MRCP images were rendered by post processing. CONTRAST:  79m GADAVIST GADOBUTROL 1 MMOL/ML IV SOLN COMPARISON:  CT scan 11/18/2019 FINDINGS: Motion degraded study due to patient inability to reproducibly breath hold. Lower chest: Unremarkable. Hepatobiliary: 8 mm cyst noted inferior right liver. Gallbladder is distended with layering tiny gallstones. Common duct measures 7 mm diameter. There is abnormal signal in the distal common bile duct. This is not well evaluated due to motion artifact and could represent sludge impacted in the distal common bile duct although tiny layering/stacked gallstones cannot be excluded. Pancreas: Limited assessment due to motion degradation on postcontrast imaging. No focal mass lesion. No dilatation of the main duct. No intraparenchymal cyst. No peripancreatic edema. Spleen:  No gross abnormality. Adrenals/Urinary Tract: No adrenal nodule or mass. Bilateral renal cysts. Exophytic 10 mm complex cystic lesion in the interpolar right kidney (image 27/3 and image 330/18). This is not well evaluated due to motion degradation but comparing today's CT to a CT scan from 09/18/2015, this lesion has decreased in size in the interval consistent with benign etiology. Stomach/Bowel: Stomach is moderately distended. No small bowel or colonic dilatation within the visualized abdomen. Vascular/Lymphatic: Atherosclerotic changes noted in the abdominal aorta. No  abdominal lymphadenopathy. Other:  No intraperitoneal free fluid. Musculoskeletal: No suspicious marrow enhancement within the visualized bony anatomy. IMPRESSION: 1. Motion degraded study due to patient inability to reproducibly breath hold. 2. There is abnormal signal in the 7 mm diameter distal common bile duct with loss of normal T2 hyperintensity. This is not well evaluated due to motion artifact and could represent sludge or stacked gallstones. No suspicious enhancement to suggest soft tissue mass, but follow-up likely warranted. 3. Cholelithiasis. 4. Right renal cysts. Electronically Signed   By: EMisty StanleyM.D.   On: 11/18/2019 16:04    Principal Problem:   Cholelithiasis  Active Problems:   Disease of thyroid gland   BP (high blood pressure)   HLD (hyperlipidemia)   BPH (benign prostatic hyperplasia)   Elevated liver enzymes   Dementia without behavioral disturbance ([place])   Accident due to mechanical fall without injury   Hyperglycemia due to type 2 diabetes mellitus ([facility])   Leukocytosis   AKI (acute kidney injury) ([place])   Troponin I above reference range   COPD (chronic obstructive pulmonary disease) ([place])    Tye Savoy, NP-C @  11/19/2019, 8:57 AM

## 2019-11-19 NOTE — Progress Notes (Signed)
[facility]  PROGRESS NOTE  [name] [id number] DOB: 04-Mar-1925 DOA: 11/18/2019 PCP: [name], MD Admit date - 11/18/2019   Admitting Physician Bernadette Hoit, DO  Outpatient Primary MD for the patient is [name], MD  LOS - 1 Brief Narrative   David Morgan is a 86 y.o. year old male with medical history significant for emphysematous COPD, HTN, HLD, hypothyroidism who presented on 11/18/2019 to [facility] ED with complaints of recurrent falls from home and worsening confusion per wife report. Upon EMS evaluation patient complained of abdominal pain and noted to have worsening breathing.  At [facility] ED patient was noted to be tachypneic, BP 164/76, afebrile with leukocytosis, lactic acidosis of 3.3, and AKI, creatinine 1.34, baseline 0.6-0.8, with elevated bilirubin of 4.6.  Underwent CT head and cervical spine given fall which was negative for acute findings. CT abdomen concerning for distended gallbladder with inflammatory changes worrisome for acute cholecystitis. MRCP .  General surgery was consulted in ED who recommended GI consultation.  GI recommended MRCP which showed distended gallbladder with stones and abnormal signal of CBD that could be sludge or artifact ( study was difficult).  Patient was transferred to [facility] for ERCP.  He received Zosyn in the ED    Subjective  Today still having abdominal pain.  Denies any shortness of breath  A & P    Elevated LFTs and hyperbilirubinemia and abdominal pain. Concerning for choledocholithiasis. MRCP shows abnormal signal in CBD that could be sludge/gallstones/or artifact as it was a motion degraded test. Remains afebrile but has leukocytosis and elevated procalcitonin -Start zosyn -monitor blood cultures -GI consulted, appreciated recommendations, NPO for ERCP  Severe sepsis presumed secondary to choledocholithiasis.  Reportedly afebrile on admission, but noted to have lactic acidosis, leukocytosis,  tachypnea in the setting of concern for choledocholithiasis.  Patient received Zosyn in ED.  Procalcitonin here is elevated.  High concern for potential cholangitis.  Currently afebrile,  Other sources of infection ruled out with unremarkable UA, ct abd and chest imaging -daily cbc -Start Zosyn -monitor blood cultures  AKI.  Likely multifactorial large related to decreased p.o. intake, infectious etiology.  Previous baseline 0.6-0.92.  1.35 on admission. -Avoid nephrotoxins -Daily BMP -Monitor output, will start IV fluids  Witnessed falls at home Suspect related to worsening confusion on top of dementia related to likely infection above. Trauma imaging negative for acute findings -PT/OT  Worsening confusion, with baseline dementia without behavioral disturbance. Suspect infectious encephalopathy. CT head neg for acute findings. Alert to self only -corroborate baseline with family -Home Aricept  COPD. On 2 L but with great O2 saturation of 99%. Has wheezing in upper fields. Possible abdominal pain worsening breathing status -schedule duo-nebs -wean O2 for goal spO2>88%  Elevated high-sensitivity troponin, mild.  Suspect demand ischemia in the setting of infectious etiology and severe sepsis.  Peak troponin of 30, downtrending to 26.  A. fib on EKG with no ischemic changes -Closely monitor   Atrial fibrillation, new diagnosis? EKG on 4/2 documents A. fib.  No known history per chart review. Currently rate controlled -telemetry monitoring -Repeat EKG, check TSH  Hypertension, stable -Continue home Coreg -Holding HCTZ, losartan  Hypothyroidism -Continue home Synthroid  Type 2 diabetes -Holding home metformin -Monitor CBGs, signs scale as needed  BPH, stable -Monitor output, continue home Flomax      Family Communication  :  None. Called wife David Morgan at [phone] on 4/3.  Left a message.  Code Status : Full  Disposition  Plan  :  Patient is from home.  Anticipated d/c date: 2 to 3 days. Barriers to d/c or necessity for inpatient status:  IV Zosyn for presumed cholangitis in setting of concerning choledocholithiasis, needs undergo ERCP per GI Consults  : GI  Procedures  : ERCP, 4/3  DVT Prophylaxis  : SCDs  Lab Results  Component Value Date   PLT 162 11/19/2019    Diet :  Diet Order            Diet NPO time specified  Diet effective now               Inpatient Medications Scheduled Meds:  carvedilol  3.125 mg Oral BID WC   donepezil  5 mg Oral Daily   insulin aspart  0-15 Units Subcutaneous TID WC   insulin aspart  0-5 Units Subcutaneous QHS   ipratropium-albuterol  3 mL Nebulization Q6H   levothyroxine  112 mcg Oral Q0600   tamsulosin  0.4 mg Oral QPM   Continuous Infusions:  piperacillin-tazobactam (ZOSYN)  IV 3.375 g (11/19/19 0847)   PRN Meds:.morphine injection  Antibiotics  :   Anti-infectives (From admission, onward)   Start     Dose/Rate Route Frequency Ordered Stop   11/19/19 0815  piperacillin-tazobactam (ZOSYN) IVPB 3.375 g     3.375 g 12.5 mL/hr over 240 Minutes Intravenous Every 8 hours 11/19/19 0806         Objective   Vitals:   11/19/19 0014 11/19/19 0511 11/19/19 0754 11/19/19 0854  BP: 119/61 (!) 126/59 124/70 116/66  Pulse: 94 95 95 95  Resp: 18 (!) 23 (!) 30   Temp: 97.8 F (36.6 C) 99.1 F (37.3 C) 98.6 F (37 C)   TempSrc: Oral Oral Oral   SpO2: 97% 97% 97%   Weight:      Height:        SpO2: 97 % O2 Flow Rate (L/min): 2 L/min  Wt Readings from Last 3 Encounters:  11/18/19 104 kg  11/18/19 104 kg  12/21/17 104.3 kg    No intake or output data in the 24 hours ending 11/19/19 0903  Physical Exam:     Oriented to self only, no distress No new F.N deficits,  [place].AT, Shallow inspiratory effort on 2 L, diffuse wheezing in upper lung fields, no rhonchi or crackles Positive bowel sounds, Distended abdomen, soft, tenderness greatest in periumbilical and right  quadrant. No guarding, no rigidity or rebound tenderness RRR,No Gallops,Rubs or new Murmurs,  No edema   I have personally reviewed the following:   Data Reviewed:  CBC Recent Labs  Lab 11/18/19 1138 11/19/19 0255  WBC 23.5* 26.4*  HGB 14.6 14.0  HCT 45.4 44.4  PLT 175 162  MCV 89.9 90.8  MCH 28.9 28.6  MCHC 32.2 31.5  RDW 14.1 14.3  LYMPHSABS 0.7  --   MONOABS 0.9  --   EOSABS 0.0  --   BASOSABS 0.0  --     Chemistries  Recent Labs  Lab 11/18/19 1138 11/19/19 0255  NA 134* 139  K 4.5 3.7  CL 97* 99  CO2 23 24  GLUCOSE 386* 225*  BUN 28* 25*  CREATININE 1.34* 1.35*  CALCIUM 9.1 9.0  AST 192* 109*  ALT 174* 140*  ALKPHOS 109 86  BILITOT 4.6* 5.0*   ------------------------------------------------------------------------------------------------------------------ No results for input(s): CHOL, HDL, LDLCALC, TRIG, CHOLHDL, LDLDIRECT in the last 72 hours.  Lab Results  Component Value Date   HGBA1C 6.8 (H)  11/19/2019   ------------------------------------------------------------------------------------------------------------------ No results for input(s): TSH, T4TOTAL, T3FREE, THYROIDAB in the last 72 hours.  Invalid input(s): FREET3 ------------------------------------------------------------------------------------------------------------------ No results for input(s): VITAMINB12, FOLATE, FERRITIN, TIBC, IRON, RETICCTPCT in the last 72 hours.  Coagulation profile Recent Labs  Lab 11/19/19 0255  INR 1.5*    No results for input(s): DDIMER in the last 72 hours.  Cardiac Enzymes No results for input(s): CKMB, TROPONINI, MYOGLOBIN in the last 168 hours.  Invalid input(s): CK ------------------------------------------------------------------------------------------------------------------ No results found for: BNP  Micro Results Recent Results (from the past 240 hour(s))  Blood culture (routine x 2)     Status: None (Preliminary result)    Collection Time: 11/18/19  4:02 PM   Specimen: BLOOD  Result Value Ref Range Status   Specimen Description BLOOD L HAND  Final   Special Requests   Final    BOTTLES DRAWN AEROBIC AND ANAEROBIC Blood Culture results may not be optimal due to an inadequate volume of blood received in culture bottles   Culture   Final    NO GROWTH < 24 HOURS Performed at [facility], [address] [place], [place] [address]    Report Status PENDING  Incomplete  Blood culture (routine x 2)     Status: None (Preliminary result)   Collection Time: 11/18/19  4:04 PM   Specimen: BLOOD  Result Value Ref Range Status   Specimen Description BLOOD R HAND  Final   Special Requests   Final    BOTTLES DRAWN AEROBIC AND ANAEROBIC Blood Culture results may not be optimal due to an inadequate volume of blood received in culture bottles   Culture   Final    NO GROWTH < 24 HOURS Performed at [facility], [address] [place], [place] [address]    Report Status PENDING  Incomplete  Respiratory Panel by RT PCR (Flu A&B, Covid) - Nasopharyngeal Swab     Status: None   Collection Time: 11/18/19  6:18 PM   Specimen: Nasopharyngeal Swab  Result Value Ref Range Status   SARS Coronavirus 2 by RT PCR NEGATIVE NEGATIVE Final    Comment: (NOTE) SARS-CoV-2 target nucleic acids are NOT DETECTED. The SARS-CoV-2 RNA is generally detectable in upper respiratoy specimens during the acute phase of infection. The lowest concentration of SARS-CoV-2 viral copies this assay can detect is 131 copies/mL. A negative result does not preclude SARS-Cov-2 infection and should not be used as the sole basis for treatment or other patient management decisions. A negative result may occur with  improper specimen collection/handling, submission of specimen other than nasopharyngeal swab, presence of viral mutation(s) within the areas targeted by this assay, and inadequate number of viral copies (<131 copies/mL). A  negative result must be combined with clinical observations, patient history, and epidemiological information. The expected result is Negative. Fact Sheet for Patients:  PinkCheek.be Fact Sheet for Healthcare Providers:  GravelBags.it This test is not yet ap proved or cleared by the [place] FDA and  has been authorized for detection and/or diagnosis of SARS-CoV-2 by FDA under an Emergency Use Authorization (EUA). This EUA will remain  in effect (meaning this test can be used) for the duration of the COVID-19 declaration under Section 564(b)(1) of the Act, 21 U.S.C. section 360bbb-3(b)(1), unless the authorization is terminated or revoked sooner.    Influenza A by PCR NEGATIVE NEGATIVE Final   Influenza B by PCR NEGATIVE NEGATIVE Final    Comment: (NOTE) The Xpert Xpress SARS-CoV-2/FLU/RSV assay is intended as  an aid in  the diagnosis of influenza from Nasopharyngeal swab specimens and  should not be used as a sole basis for treatment. Nasal washings and  aspirates are unacceptable for Xpert Xpress SARS-CoV-2/FLU/RSV  testing. Fact Sheet for Patients: PinkCheek.be Fact Sheet for Healthcare Providers: GravelBags.it This test is not yet approved or cleared by the [place] FDA and  has been authorized for detection and/or diagnosis of SARS-CoV-2 by  FDA under an Emergency Use Authorization (EUA). This EUA will remain  in effect (meaning this test can be used) for the duration of the  Covid-19 declaration under Section 564(b)(1) of the Act, 21  U.S.C. section 360bbb-3(b)(1), unless the authorization is  terminated or revoked. Performed at [facility], [address] [place], [place] [address]     Radiology Reports CT Head Wo Contrast  Result Date: 11/18/2019 CLINICAL DATA:  [place] at home yesterday. [place] again this morning. Confusion. EXAM: CT HEAD  WITHOUT CONTRAST CT CERVICAL SPINE WITHOUT CONTRAST TECHNIQUE: Multidetector CT imaging of the head and cervical spine was performed following the standard protocol without intravenous contrast. Multiplanar CT image reconstructions of the cervical spine were also generated. COMPARISON:  Head CT 12/21/2017 FINDINGS: CT HEAD FINDINGS Brain: Stable age related significant cerebral atrophy, ventriculomegaly and periventricular white matter disease. No extra-axial fluid collections are identified. No CT findings for acute hemispheric infarction or intracranial hemorrhage. No mass lesions. The brainstem and cerebellum are normal. Vascular: Stable vascular calcifications. No hyperdense vessels or obvious aneurysm. Skull: No skull fracture or bone lesions. Sinuses/Orbits: The paranasal sinuses and mastoid air cells are clear. The globes are intact. Other: No scalp lesions or obvious laceration or foreign body. CT CERVICAL SPINE FINDINGS Alignment: Normal overall alignment. Mild degenerative anterior subluxation of C7 compared to T1. The facet joints are fused at this disc space level. Skull base and vertebrae: No acute fracture. No primary bone lesion or focal pathologic process. Soft tissues and spinal canal: No prevertebral fluid or swelling. No visible canal hematoma. Disc levels: The spinal canal is fairly generous. No significant spinal stenosis. Multilevel facet disease and mild uncinate spurring changes contributing to mild multilevel bony foraminal narrowing. Upper chest: The lung apices are grossly clear. Other: No neck mass or adenopathy. Carotid artery calcifications are noted. IMPRESSION: 1. Stable age related cerebral atrophy, ventriculomegaly and periventricular white matter disease. 2. No acute intracranial findings or skull fracture. 3. Degenerative cervical spondylosis with multilevel disc disease, facet disease and uncinate spurring changes but no acute cervical spine fracture. Electronically Signed   By:  Marijo Sanes M.D.   On: 11/18/2019 13:19   CT Angio Chest PE W and/or Wo Contrast  Result Date: 11/18/2019 CLINICAL DATA:  86 year old male with abdominal trauma EXAM: CT ANGIOGRAPHY CHEST CT ABDOMEN AND PELVIS WITH CONTRAST TECHNIQUE: Multidetector CT imaging of the chest was performed using the standard protocol during bolus administration of intravenous contrast. Multiplanar CT image reconstructions and MIPs were obtained to evaluate the vascular anatomy. Multidetector CT imaging of the abdomen and pelvis was performed using the standard protocol during bolus administration of intravenous contrast. CONTRAST:  10mL OMNIPAQUE IOHEXOL 350 MG/ML SOLN COMPARISON:  09/18/2015 FINDINGS: CTA CHEST FINDINGS Cardiovascular: Heart: No cardiomegaly. No pericardial fluid/thickening. Calcifications of left main, left anterior descending, circumflex, right coronary arteries. Aorta: Calcifications of the aortic valve. Unremarkable course caliber and contour of the thoracic aorta. Moderate atherosclerosis of the aorta including some irregular intimal plaque of the sending thoracic aorta. No ulcerated plaque or evidence of dissection.  Pulmonary arteries: No central, lobar, segmental, or proximal subsegmental filling defects. Mediastinum/Nodes: Multiple small mediastinal lymph nodes throughout all nodal stations. Unremarkable appearance of the thoracic esophagus. Unremarkable thoracic inlet Lungs/Pleura: Centrilobular and paraseptal emphysema. Respiratory motion limits evaluation for small nodules. No pneumothorax or pleural effusion. No confluent airspace disease. Review of the MIP images confirms the above findings. CT ABDOMEN and PELVIS FINDINGS Hepatobiliary: Unremarkable liver. Gallbladder is distended with radiopaque stones in the neck of the gallbladder. No significant pericholecystic fluid. Mild inflammatory changes of the gallbladder wall and the adjacent fat. No definite radiopaque stones identified in the region of  the common bile duct. Pancreas: Unremarkable Spleen: Unremarkable Adrenals/Urinary Tract: - Right adrenal gland:  Unremarkable - Left adrenal gland: Unremarkable. - Right kidney: No hydronephrosis, nephrolithiasis, inflammation, or ureteral dilation. Hypodense lesion on the posterior cortex of the right kidney is too small to characterize. Hyperdense focal lesion on the lateral cortex of the right kidney was present on the comparison CT and most likely represents a complex cyst given the decreasing size though is strictly too small to characterize. - Left Kidney: No hydronephrosis, nephrolithiasis, inflammation, or ureteral dilation. No focal lesion. - Urinary Bladder: Urinary bladder relatively decompressed. Stomach/Bowel: - Stomach: Small hiatal hernia.  Otherwise unremarkable stomach. - Small bowel: Unremarkable - Appendix: Normal. - Colon: Colonic diverticula of the sigmoid colon and the left colon. No focal inflammatory changes. No bowel obstruction. Mild stool burden. Vascular/Lymphatic: Atherosclerosis of the abdominal aorta. No periaortic fluid or inflammatory changes. Greatest diameter of the aorta is estimated 3.4 cm. Irregular plaque versus chronic dissection just above the bifurcation. Atherosclerotic changes of the bilateral iliac arteries. Bilateral iliac arteries are patent. Proximal femoral arteries are patent. Reproductive: Calcifications of the prostate. Other: Fat containing umbilical hernia. Musculoskeletal: Osteopenia. Degenerative changes of the visualized spine no acute displaced vertebral body fracture. Kyphotic deformity of the thoracic spine likely degenerative. No bony canal narrowing.  No aggressive bony lesions identified. Treatment changes of prior vertebral augmentation of L3. Surgical changes of prior left femur repair. Review of the MIP images confirms the above findings. IMPRESSION: No acute finding of the chest CT angiogram. Cholelithiasis with distended gallbladder and vague  inflammatory changes of the gallbladder wall. These findings may reflect early acute cholecystitis. Correlation with lab values and patient presentation may be useful, as well as consideration of confirmatory HIDA study. Ultrasound will likely yield no further useful imaging information. Emphysema (ICD10-J43.9). Aortic Atherosclerosis (ICD10-I70.0). Associated coronary artery disease, bilateral iliac arterial disease, irregular plaque of the abdominal aorta. Diverticular disease without evidence of acute diverticulitis. Additional ancillary findings as above. Electronically Signed   By: Corrie Mckusick D.O.   On: 11/18/2019 13:26   CT Cervical Spine Wo Contrast  Result Date: 11/18/2019 CLINICAL DATA:  [place] at home yesterday. [place] again this morning. Confusion. EXAM: CT HEAD WITHOUT CONTRAST CT CERVICAL SPINE WITHOUT CONTRAST TECHNIQUE: Multidetector CT imaging of the head and cervical spine was performed following the standard protocol without intravenous contrast. Multiplanar CT image reconstructions of the cervical spine were also generated. COMPARISON:  Head CT 12/21/2017 FINDINGS: CT HEAD FINDINGS Brain: Stable age related significant cerebral atrophy, ventriculomegaly and periventricular white matter disease. No extra-axial fluid collections are identified. No CT findings for acute hemispheric infarction or intracranial hemorrhage. No mass lesions. The brainstem and cerebellum are normal. Vascular: Stable vascular calcifications. No hyperdense vessels or obvious aneurysm. Skull: No skull fracture or bone lesions. Sinuses/Orbits: The paranasal sinuses and mastoid air cells are clear. The globes are intact.  Other: No scalp lesions or obvious laceration or foreign body. CT CERVICAL SPINE FINDINGS Alignment: Normal overall alignment. Mild degenerative anterior subluxation of C7 compared to T1. The facet joints are fused at this disc space level. Skull base and vertebrae: No acute fracture. No primary bone lesion  or focal pathologic process. Soft tissues and spinal canal: No prevertebral fluid or swelling. No visible canal hematoma. Disc levels: The spinal canal is fairly generous. No significant spinal stenosis. Multilevel facet disease and mild uncinate spurring changes contributing to mild multilevel bony foraminal narrowing. Upper chest: The lung apices are grossly clear. Other: No neck mass or adenopathy. Carotid artery calcifications are noted. IMPRESSION: 1. Stable age related cerebral atrophy, ventriculomegaly and periventricular white matter disease. 2. No acute intracranial findings or skull fracture. 3. Degenerative cervical spondylosis with multilevel disc disease, facet disease and uncinate spurring changes but no acute cervical spine fracture. Electronically Signed   By: Marijo Sanes M.D.   On: 11/18/2019 13:19   CT ABDOMEN PELVIS W CONTRAST  Result Date: 11/18/2019 CLINICAL DATA:  86 year old male with abdominal trauma EXAM: CT ANGIOGRAPHY CHEST CT ABDOMEN AND PELVIS WITH CONTRAST TECHNIQUE: Multidetector CT imaging of the chest was performed using the standard protocol during bolus administration of intravenous contrast. Multiplanar CT image reconstructions and MIPs were obtained to evaluate the vascular anatomy. Multidetector CT imaging of the abdomen and pelvis was performed using the standard protocol during bolus administration of intravenous contrast. CONTRAST:  30mL OMNIPAQUE IOHEXOL 350 MG/ML SOLN COMPARISON:  09/18/2015 FINDINGS: CTA CHEST FINDINGS Cardiovascular: Heart: No cardiomegaly. No pericardial fluid/thickening. Calcifications of left main, left anterior descending, circumflex, right coronary arteries. Aorta: Calcifications of the aortic valve. Unremarkable course caliber and contour of the thoracic aorta. Moderate atherosclerosis of the aorta including some irregular intimal plaque of the sending thoracic aorta. No ulcerated plaque or evidence of dissection. Pulmonary arteries: No  central, lobar, segmental, or proximal subsegmental filling defects. Mediastinum/Nodes: Multiple small mediastinal lymph nodes throughout all nodal stations. Unremarkable appearance of the thoracic esophagus. Unremarkable thoracic inlet Lungs/Pleura: Centrilobular and paraseptal emphysema. Respiratory motion limits evaluation for small nodules. No pneumothorax or pleural effusion. No confluent airspace disease. Review of the MIP images confirms the above findings. CT ABDOMEN and PELVIS FINDINGS Hepatobiliary: Unremarkable liver. Gallbladder is distended with radiopaque stones in the neck of the gallbladder. No significant pericholecystic fluid. Mild inflammatory changes of the gallbladder wall and the adjacent fat. No definite radiopaque stones identified in the region of the common bile duct. Pancreas: Unremarkable Spleen: Unremarkable Adrenals/Urinary Tract: - Right adrenal gland:  Unremarkable - Left adrenal gland: Unremarkable. - Right kidney: No hydronephrosis, nephrolithiasis, inflammation, or ureteral dilation. Hypodense lesion on the posterior cortex of the right kidney is too small to characterize. Hyperdense focal lesion on the lateral cortex of the right kidney was present on the comparison CT and most likely represents a complex cyst given the decreasing size though is strictly too small to characterize. - Left Kidney: No hydronephrosis, nephrolithiasis, inflammation, or ureteral dilation. No focal lesion. - Urinary Bladder: Urinary bladder relatively decompressed. Stomach/Bowel: - Stomach: Small hiatal hernia.  Otherwise unremarkable stomach. - Small bowel: Unremarkable - Appendix: Normal. - Colon: Colonic diverticula of the sigmoid colon and the left colon. No focal inflammatory changes. No bowel obstruction. Mild stool burden. Vascular/Lymphatic: Atherosclerosis of the abdominal aorta. No periaortic fluid or inflammatory changes. Greatest diameter of the aorta is estimated 3.4 cm. Irregular plaque  versus chronic dissection just above the bifurcation. Atherosclerotic changes of the bilateral  iliac arteries. Bilateral iliac arteries are patent. Proximal femoral arteries are patent. Reproductive: Calcifications of the prostate. Other: Fat containing umbilical hernia. Musculoskeletal: Osteopenia. Degenerative changes of the visualized spine no acute displaced vertebral body fracture. Kyphotic deformity of the thoracic spine likely degenerative. No bony canal narrowing.  No aggressive bony lesions identified. Treatment changes of prior vertebral augmentation of L3. Surgical changes of prior left femur repair. Review of the MIP images confirms the above findings. IMPRESSION: No acute finding of the chest CT angiogram. Cholelithiasis with distended gallbladder and vague inflammatory changes of the gallbladder wall. These findings may reflect early acute cholecystitis. Correlation with lab values and patient presentation may be useful, as well as consideration of confirmatory HIDA study. Ultrasound will likely yield no further useful imaging information. Emphysema (ICD10-J43.9). Aortic Atherosclerosis (ICD10-I70.0). Associated coronary artery disease, bilateral iliac arterial disease, irregular plaque of the abdominal aorta. Diverticular disease without evidence of acute diverticulitis. Additional ancillary findings as above. Electronically Signed   By: Corrie Mckusick D.O.   On: 11/18/2019 13:26   MR 3D Recon At Scanner  Result Date: 11/18/2019 CLINICAL DATA:  Cholelithiasis with elevated bilirubin. EXAM: MRI ABDOMEN WITHOUT AND WITH CONTRAST (INCLUDING MRCP) TECHNIQUE: Multiplanar multisequence MR imaging of the abdomen was performed both before and after the administration of intravenous contrast. Heavily T2-weighted images of the biliary and pancreatic ducts were obtained, and three-dimensional MRCP images were rendered by post processing. CONTRAST:  66mL GADAVIST GADOBUTROL 1 MMOL/ML IV SOLN COMPARISON:  CT  scan 11/18/2019 FINDINGS: Motion degraded study due to patient inability to reproducibly breath hold. Lower chest: Unremarkable. Hepatobiliary: 8 mm cyst noted inferior right liver. Gallbladder is distended with layering tiny gallstones. Common duct measures 7 mm diameter. There is abnormal signal in the distal common bile duct. This is not well evaluated due to motion artifact and could represent sludge impacted in the distal common bile duct although tiny layering/stacked gallstones cannot be excluded. Pancreas: Limited assessment due to motion degradation on postcontrast imaging. No focal mass lesion. No dilatation of the main duct. No intraparenchymal cyst. No peripancreatic edema. Spleen:  No gross abnormality. Adrenals/Urinary Tract: No adrenal nodule or mass. Bilateral renal cysts. Exophytic 10 mm complex cystic lesion in the interpolar right kidney (image 27/3 and image 330/18). This is not well evaluated due to motion degradation but comparing today's CT to a CT scan from 09/18/2015, this lesion has decreased in size in the interval consistent with benign etiology. Stomach/Bowel: Stomach is moderately distended. No small bowel or colonic dilatation within the visualized abdomen. Vascular/Lymphatic: Atherosclerotic changes noted in the abdominal aorta. No abdominal lymphadenopathy. Other:  No intraperitoneal free fluid. Musculoskeletal: No suspicious marrow enhancement within the visualized bony anatomy. IMPRESSION: 1. Motion degraded study due to patient inability to reproducibly breath hold. 2. There is abnormal signal in the 7 mm diameter distal common bile duct with loss of normal T2 hyperintensity. This is not well evaluated due to motion artifact and could represent sludge or stacked gallstones. No suspicious enhancement to suggest soft tissue mass, but follow-up likely warranted. 3. Cholelithiasis. 4. Right renal cysts. Electronically Signed   By: Misty Stanley M.D.   On: 11/18/2019 16:04   MR  ABDOMEN MRCP W WO CONTAST  Result Date: 11/18/2019 CLINICAL DATA:  Cholelithiasis with elevated bilirubin. EXAM: MRI ABDOMEN WITHOUT AND WITH CONTRAST (INCLUDING MRCP) TECHNIQUE: Multiplanar multisequence MR imaging of the abdomen was performed both before and after the administration of intravenous contrast. Heavily T2-weighted images of the biliary and pancreatic  ducts were obtained, and three-dimensional MRCP images were rendered by post processing. CONTRAST:  27mL GADAVIST GADOBUTROL 1 MMOL/ML IV SOLN COMPARISON:  CT scan 11/18/2019 FINDINGS: Motion degraded study due to patient inability to reproducibly breath hold. Lower chest: Unremarkable. Hepatobiliary: 8 mm cyst noted inferior right liver. Gallbladder is distended with layering tiny gallstones. Common duct measures 7 mm diameter. There is abnormal signal in the distal common bile duct. This is not well evaluated due to motion artifact and could represent sludge impacted in the distal common bile duct although tiny layering/stacked gallstones cannot be excluded. Pancreas: Limited assessment due to motion degradation on postcontrast imaging. No focal mass lesion. No dilatation of the main duct. No intraparenchymal cyst. No peripancreatic edema. Spleen:  No gross abnormality. Adrenals/Urinary Tract: No adrenal nodule or mass. Bilateral renal cysts. Exophytic 10 mm complex cystic lesion in the interpolar right kidney (image 27/3 and image 330/18). This is not well evaluated due to motion degradation but comparing today's CT to a CT scan from 09/18/2015, this lesion has decreased in size in the interval consistent with benign etiology. Stomach/Bowel: Stomach is moderately distended. No small bowel or colonic dilatation within the visualized abdomen. Vascular/Lymphatic: Atherosclerotic changes noted in the abdominal aorta. No abdominal lymphadenopathy. Other:  No intraperitoneal free fluid. Musculoskeletal: No suspicious marrow enhancement within the visualized  bony anatomy. IMPRESSION: 1. Motion degraded study due to patient inability to reproducibly breath hold. 2. There is abnormal signal in the 7 mm diameter distal common bile duct with loss of normal T2 hyperintensity. This is not well evaluated due to motion artifact and could represent sludge or stacked gallstones. No suspicious enhancement to suggest soft tissue mass, but follow-up likely warranted. 3. Cholelithiasis. 4. Right renal cysts. Electronically Signed   By: Misty Stanley M.D.   On: 11/18/2019 16:04     Time Spent in minutes  30     Desiree Hane M.D on 11/19/2019 at 9:03 AM  To page go to [email or web address] - password [email or web address]

## 2019-11-19 NOTE — Evaluation (Signed)
Occupational Therapy Evaluation Patient Details Name: David Morgan MRN: [id number] DOB: May 19, 1925 Today's Date: 11/19/2019    History of Present Illness Pt is a 86 yr old male who presented due to [facility] ED due to fall.  PMH: hypertension, hyperlipidemia, type 2 diabetes mellitus, hypothyroidism, BPH and dementia   Clinical Impression   Pt admitted with [facility] ED due to fall. Pt currently with functional limitations due to the deficits listed below (see OT Problem List). Pt is very HOH and did better in session when witting down information in the visit. Pt reported they [place] there wife [place] farm and a son live in [place]. Pt did change some of his PLOF information in the session. Pt completed bed mobility from supine to sitting with max assist x2, sitting at EOB with min to moderate assist and lateral scooting/transfer to [facility] with moderate assist x2.  Pt did become very fearful in session and decrease ability to follow therapists cues. Pt will benefit from skilled OT to increase their safety and independence with ADL and functional mobility for ADL to facilitate discharge to venue listed below. Pt will be followed by Occupational Therapy services.       Follow Up Recommendations  SNF;Supervision/Assistance - 24 hour    Equipment Recommendations       Recommendations for Other Services       Precautions / Restrictions Precautions Precautions: Fall Restrictions Weight Bearing Restrictions: No      Mobility Bed Mobility Overal bed mobility: Needs Assistance Bed Mobility: Supine to Sit     Supine to sit: Max assist;+2 for physical assistance;+2 for safety/equipment Sit to supine: Mod assist;+2 for physical assistance;+2 for safety/equipment   General bed mobility comments: needs assist with transition and also positioning  Transfers Overall transfer level: Needs assistance Equipment used: Rolling walker (2 wheeled) Transfers: Lateral/Scoot Transfers          Lateral/Scoot Transfers: Mod assist;+2 physical assistance;+2 safety/equipment;From elevated surface(minimal clearance from bed ) General transfer comment: very anxious with movment as trying to feet under walker    Balance Overall balance assessment: Needs assistance Sitting-balance support: Feet supported;Bilateral upper extremity supported Sitting balance-Leahy Scale: Fair Sitting balance - Comments: (P) needs assist to maintain falling backwards and laterally otherwise Postural control: (P) Right lateral lean;Left lateral lean;Posterior lean Standing balance support: Bilateral upper extremity supported Standing balance-Leahy Scale: Poor Standing balance comment: (P) not able to stand yet                           ADL either performed or assessed with clinical judgement   ADL Overall ADL's : Needs assistance/impaired Eating/Feeding: Set up;Bed level   Grooming: Wash/dry hands;Wash/dry face;Minimal assistance;Bed level   Upper Body Bathing: Moderate assistance;Bed level;Cueing for safety;Cueing for sequencing   Lower Body Bathing: Maximal assistance;+2 for physical assistance;+2 for safety/equipment;Cueing for safety;Cueing for sequencing;Sit to/from stand   Upper Body Dressing : Moderate assistance;Bed level;Cueing for safety;Cueing for sequencing   Lower Body Dressing: Maximal assistance;+2 for physical assistance;+2 for safety/equipment;Cueing for safety;Cueing for sequencing;Sit to/from stand   Toilet Transfer: Maximal assistance;+2 for physical assistance;+2 for safety/equipment;Cueing for safety;Cueing for sequencing;Stand-pivot   Toileting- Clothing Manipulation and Hygiene: Total assistance;+2 for physical assistance;+2 for safety/equipment;Cueing for safety;Cueing for sequencing;Sit to/from stand   Tub/ Shower Transfer: Maximal assistance;+2 for physical assistance;+2 for safety/equipment   Functional mobility during ADLs: Maximal assistance;+2 for physical  assistance;+2 for safety/equipment;Cueing for safety;Cueing for sequencing;Rolling walker General ADL Comments:  pt very anxious in session and difficulties following verbal instructions     Vision   Vision Assessment?: No apparent visual deficits     Perception Perception Perception Tested?: No   Praxis Praxis Praxis tested?: Not tested    Pertinent Vitals/Pain Pain Assessment: Faces Faces Pain Scale: Hurts a little bit Pain Location: general guarding Pain Descriptors / Indicators: Discomfort;Grimacing;Guarding Pain Intervention(s): Limited activity within patient's tolerance;Repositioned;Monitored during session     Hand Dominance     Extremity/Trunk Assessment Upper Extremity Assessment Upper Extremity Assessment: Generalized weakness   Lower Extremity Assessment Lower Extremity Assessment: Defer to PT evaluation   Cervical / Trunk Assessment Cervical / Trunk Assessment: Kyphotic   Communication Communication Communication: HOH;Receptive difficulties   Cognition Arousal/Alertness: Awake/alert Behavior During Therapy: Restless;Anxious;Impulsive Overall Cognitive Status: No family/caregiver present to determine baseline cognitive functioning                                 General Comments: pt did better when writting down instructions    General Comments  (P) 0n 2L/min via  sats in 90s throughout but pt wheezing and having increased work of breathing with all activity    Exercises     Shoulder Instructions      Home Living Family/patient expects to be discharged to:: Private residence Living Arrangements: Spouse/significant other Available Help at Discharge: Family Type of Home: House Home Access: Level entry     [place]: One level     Bathroom Shower/Tub: Occupational psychologist: [place] Accessibility: Yes How Accessible: Accessible via walker Home Equipment: Environmental consultant - 2 wheels   Additional Comments: pt very  HOH and has some difficulty understanding verbal cues but does better when questions written down(due to cognition and HOH unsure if all information is correc)      Prior Functioning/Environment Level of Independence: Needs assistance  Gait / Transfers Assistance Needed: ambulated with walker after fall ADL's / Homemaking Assistance Needed: assisted by spouse with ADLs, also has home care couple times a week Communication / Swallowing Assistance Needed: HOH          OT Problem List: Decreased strength;Decreased range of motion;Decreased activity tolerance;Impaired balance (sitting and/or standing);Decreased safety awareness;Decreased knowledge of use of DME or AE;Pain      OT Treatment/Interventions: Self-care/ADL training;Therapeutic exercise;Neuromuscular education;DME and/or AE instruction;Therapeutic activities;Cognitive remediation/compensation;Patient/family education;Balance training    OT Goals(Current goals can be found in the care plan section) Acute Rehab OT Goals Patient Stated Goal: none reported OT Goal Formulation: With patient Time For Goal Achievement: 12/03/19 Potential to Achieve Goals: Good ADL Goals Pt Will Perform Grooming: with set-up;sitting Pt Will Perform Upper Body Dressing: with min assist;sitting Pt Will Perform Lower Body Dressing: with min assist;sit to/from stand Pt Will Transfer to Toilet: with mod assist  OT Frequency: Min 2X/week   Barriers to D/C:            Co-evaluation PT/OT/SLP Co-Evaluation/Treatment: Yes Reason for Co-Treatment: Complexity of the patient's impairments (multi-system involvement);Necessary to address cognition/behavior during functional activity;For patient/therapist safety;To address functional/ADL transfers PT goals addressed during session: Mobility/safety with mobility;Balance;Proper use of DME;Strengthening/ROM OT goals addressed during session: ADL's and self-care;Proper use of Adaptive equipment and  DME;Strengthening/ROM      AM-PAC OT "6 Clicks" Daily Activity     Outcome Measure Help from another person eating meals?: A Little Help from another person taking care of personal grooming?: A Little Help  from another person toileting, which includes using toliet, bedpan, or urinal?: A Lot Help from another person bathing (including washing, rinsing, drying)?: A Lot Help from another person to put on and taking off regular upper body clothing?: A Lot Help from another person to put on and taking off regular lower body clothing?: A Lot 6 Click Score: 14   End of Session Equipment Utilized During Treatment: Gait belt;Rolling walker Nurse Communication: Mobility status  Activity Tolerance: Treatment limited secondary to medical complications (Comment);Other (comment)(decrease ability to follow commands) Patient left: in bed;with call bell/phone within reach;with bed alarm set  OT Visit Diagnosis: Unsteadiness on feet (R26.81);Other abnormalities of gait and mobility (R26.89);Muscle weakness (generalized) (M62.81);History of falling (Z91.81);Pain Pain - part of body: (general )                Time: [id number] OT Time Calculation (min): 41 min Charges:  OT General Charges $OT Visit: 1 Visit OT Evaluation $OT Eval Moderate Complexity: 1 Mod OT Treatments $Self Care/Home Management : 23-37 mins  Joeseph Amor OTR/L  Acute Rehab Services  [phone] office number [phone] pager number   Joeseph Amor 11/19/2019, 12:21 PM

## 2019-11-19 NOTE — Progress Notes (Signed)
Pharmacy Antibiotic Note  David Morgan is a 86 y.o. male admitted on 11/18/2019 with intra-abdominal infection.  Pharmacy has been consulted for Zosyn dosing.  WBC elevated at 26.4, lactate elevated 2.2, afebrile. Scr elevated at 1.35 (Scr 0.74 in 2019). MRI and CT of abdomen yesterday showed cholelithiasis with possible early acute cholecystitis. Of note, pt has allergy listed for Augmentin but has received Zosyn in the past.  Plan: Zosyn 3.375g IV q8h (4 hour infusion).  Monitor renal function, cultures/sensitivities, clinical progression F/u LOT of ABX  Height: 5\' 9"  (175.3 cm) Weight: 104 kg (229 lb 4.5 oz) IBW/kg (Calculated) : 70.7  Temp (24hrs), Avg:98.2 F (36.8 C), Min:97.5 F (36.4 C), Max:99.1 F (37.3 C)  Recent Labs  Lab 11/18/19 1138 11/18/19 1604 11/18/19 1818 11/19/19 0255  WBC 23.5*  --   --  26.4*  CREATININE 1.34*  --   --  1.35*  LATICACIDVEN  --  3.3* 2.2*  --     Estimated Creatinine Clearance: 41.5 mL/min (A) (by C-G formula based on SCr of 1.35 mg/dL (H)).    Allergies  Allergen Reactions  . Adhesive [Tape] Rash  . Augmentin [Amoxicillin-Pot Clavulanate] Itching, Rash and Other (See Comments)    Has patient had a PCN reaction causing immediate rash, facial/tongue/throat swelling, SOB or lightheadedness with hypotension: No Has patient had a PCN reaction causing severe rash involving mucus membranes or skin necrosis: No Has patient had a PCN reaction that required hospitalization No Has patient had a PCN reaction occurring within the last 10 years: No If all of the above answers are "NO", then may proceed with Cephalosporin use.     Antimicrobials this admission: Zosyn 4/2 >>   Dose adjustments this admission: N/A  Microbiology results: none  Richardine Service, PharmD PGY1 Pharmacy Resident Phone: [phone] 11/19/2019  8:32 AM  Please check [email or web address] for unit-specific pharmacy phone numbers.

## 2019-11-19 NOTE — Progress Notes (Signed)
PHYSICAL THERAPY EVALUATION NOTE  HISTORY OF CURRENT ILLNESS: 86  y/o male w/ hx HTN, HLD, DM II, PVD, mild arthma, hypothyroidism, BPH, dementia, R TKA, R shoulder sx, L hip ORIF, presented to [facility] ED sec to mechanical falls x 2, hit head on second fall w/o LOC. Pt found to be tachypneic, w/ leukocytosis, hyperglycemia, cholelithiasis.  CLINICAL IMPRESSION: Pt admitted with above hx and above dx. Hard to get PLOF from pt as he is very [facility] and has difficulty with giving accurate hx. When questions are written down does much better. Pt was extremely agitated and erratic during assessment, even with multi cues and him verbalizing understanding with attempted mobility pt became very erratic and impulsive. Was able to get to edge of bed with max a x2, sat edge of bed with min/mod a to maintain sitting, max cues for safety and to calm down. Attempted to stand but only able to laterally scoot in bed, with squat positioning. Pt will benefit from continued PT tx while in hospital to address declines in strength, balance and coordination, activity tolerance, independence and overall safety with mobility/functional nativities.  D/C RECOMMENDATION: SNF/24hour care/supiervision. Spouse may not be able to care for pt at this level will benefit from post acute care rehabilitation.    11/19/19 1000  PT Visit Information  Last PT Received On 11/19/19  Assistance Needed +2  PT/OT/SLP Co-Evaluation/Treatment Yes  Reason for Co-Treatment Complexity of the patient's impairments (multi-system involvement);Necessary to address cognition/behavior during functional activity  PT goals addressed during session Mobility/safety with mobility;Balance;Proper use of DME;Strengthening/ROM  History of Present Illness 86  y/o male w/ hx HTN, HLD, DM II, PVD, mild arthma, hypothyroidism, BPH, dementia, R TKA, R shoulder sx, L hip ORIF, presented to [facility] ED sec to mechanical falls x 2, hit head on second fall w/o LOC. Pt found to be  tachypneic, w/ leukocytosis, hyperglycemia, cholelithiasis.  Precautions  Precautions Fall  Restrictions  Weight Bearing Restrictions No  Home Living  Family/patient expects to be discharged to: Private residence  Living Arrangements Spouse/significant other  Type of [place] Access Level entry  Home Layout One level  Bathroom Shower/Tub Experiment - 2 wheels  Additional Comments pt very HOH and has some difficulty understanding verbal cues but does better when questions written down  Prior Function  Level of Independence Needs assistance  Gait / Transfers Assistance Needed ambulated with walker after fall  ADL's / [place] assisted by spouse with ADLs, also has home care couple times a week  Communication / [place]  Communication  Communication HOH;Receptive difficulties (better when written down)  Pain Assessment  Pain Assessment Faces (a little in belly)  Faces Pain Scale 2  Pain Location abdomen   Pain Descriptors / Indicators Grimacing;Guarding;Discomfort  Pain Intervention(s) Limited activity within patient's tolerance;Monitored during session  Cognition  Arousal/Alertness Awake/alert  Behavior During Therapy Restless;Anxious;Impulsive;Agitated  Overall Cognitive Status No family/caregiver present to determine baseline cognitive functioning  General Comments initially quite erratic and impulsive but once cues written down seemed to do better yet, still seems to have some cognitive delay or dysfunction  Difficult to assess due to Hard of hearing/deaf  Upper Extremity Assessment  Upper Extremity Assessment Generalized weakness  Lower Extremity Assessment  Lower Extremity Assessment Generalized weakness  Cervical / Trunk Assessment  Cervical / Trunk Assessment Kyphotic  Bed Mobility  Overal bed mobility Needs Assistance  Bed Mobility Supine to Sit;Sit to Supine  Supine to sit Max assist;+2  for physical assistance;+2 for safety/equipment  Sit to supine Mod assist;+2 for physical assistance;+2 for safety/equipment  General bed mobility comments needs assist with transition and also positioning  Transfers  Overall transfer level Needs assistance  Equipment used Rolling walker (2 wheeled)  Transfers Lateral/Scoot Transfers   Lateral/Scoot Transfers Mod assist;+2 physical assistance;+2 safety/equipment  General transfer comment attempted to stand but pt was not able to understand hence completd lateral scoot at edge of bed  Ambulation/Gait  General Gait Details did not attempt yet  Balance  Overall balance assessment Needs assistance  Sitting-balance support Feet supported;Bilateral upper extremity supported  Sitting balance-Leahy Scale Poor  Sitting balance - Comments needs assist to maintain falling backwards and laterally otherwise  Postural control Right lateral lean;Left lateral lean;Posterior lean  Standing balance support During functional activity;Bilateral upper extremity supported  Standing balance-Leahy Scale Zero  Standing balance comment not able to stand yet  General Comments  General comments (skin integrity, edema, etc.) 0n 2L/min via [place] sats in 90s throughout but pt wheezing and having increased work of breathing with all activity  PT - End of Session  Equipment Utilized During Treatment Gait belt;Oxygen  Activity Tolerance Patient limited by fatigue;Patient limited by lethargy;Treatment limited secondary to agitation;Treatment limited secondary to medical complications (Comment)  Patient left in bed;with call bell/phone within reach;with bed alarm set  Nurse Communication Mobility status;Other (comment) (post tx disposition)  PT Assessment  PT Recommendation/Assessment Patient needs continued PT services  PT Visit Diagnosis Other abnormalities of gait and mobility (R26.89);Unsteadiness on feet (R26.81);Muscle weakness (generalized) (M62.81)  PT Problem List  Decreased strength;Decreased activity tolerance;Decreased balance;Decreased mobility;Decreased coordination;Decreased cognition;Decreased knowledge of use of DME;Decreased safety awareness;Decreased knowledge of precautions  Barriers to Discharge Other (comment)  Barriers to Discharge Comments lives home alone with spouse who he states takes care of him  PT Plan  PT Frequency (ACUTE ONLY) Min 2X/week  PT Treatment/Interventions (ACUTE ONLY) DME instruction;Gait training;Functional mobility training;Therapeutic activities;Therapeutic exercise;Balance training;Neuromuscular re-education;Cognitive remediation;Patient/family education  AM-PAC PT "6 Clicks" Mobility Outcome Measure (Version 2)  Help needed turning from your back to your side while in a flat bed without using bedrails? 2  Help needed moving from lying on your back to sitting on the side of a flat bed without using bedrails? 2  Help needed moving to and from a bed to a chair (including a wheelchair)? 1  Help needed standing up from a chair using your arms (e.g., wheelchair or bedside chair)? 1  Help needed to walk in hospital room? 1  Help needed climbing 3-5 steps with a railing?  1  6 Click Score 8  Consider Recommendation of Discharge To: CIR/SNF/LTACH  PT Recommendation  Follow Up Recommendations SNF;Supervision/Assistance - 24 hour  PT equipment None recommended by PT  Individuals Consulted  Consulted and Agree with Results and Recommendations Patient unable/family or caregiver not available  Acute Rehab PT Goals  Patient Stated Goal did not voice  PT Goal Formulation Patient unable to participate in goal setting  Time For Goal Achievement 12/03/19  Potential to Achieve Goals Fair  PT Time Calculation  PT Start Time (ACUTE ONLY) 0953  PT Stop Time (ACUTE ONLY) 1034  PT Time Calculation (min) (ACUTE ONLY) 41 min  PT General Charges  $$ ACUTE PT VISIT 1 Visit  PT Evaluation  $PT Eval Moderate Complexity 1 Mod  PT  Treatments  $Therapeutic Activity 8-22 mins    Horald Chestnut, PT

## 2019-11-19 NOTE — Transfer of Care (Signed)
Immediate Anesthesia Transfer of Care Note  Patient: David Morgan  Procedure(s) Performed: ENDOSCOPIC RETROGRADE CHOLANGIOPANCREATOGRAPHY (ERCP) (N/A ) SPHINCTEROTOMY REMOVAL OF STONES  Patient Location: Endoscopy Unit  Anesthesia Type:General  Level of Consciousness: awake  Airway & Oxygen Therapy: Patient Spontanous Breathing and Patient connected to face mask oxygen  Post-op Assessment: Report given to RN and Post -op Vital signs reviewed and stable  Post vital signs: Reviewed and stable  Last Vitals:  Vitals Value Taken Time  BP 121/47 11/19/19 1440  Temp 37.2 C 11/19/19 1440  Pulse 97 11/19/19 1442  Resp 22 11/19/19 1442  SpO2 100 % 11/19/19 1442  Vitals shown include unvalidated device data.  Last Pain:  Vitals:   11/19/19 1440  TempSrc: Oral  PainSc: 0-No pain         Complications: No apparent anesthesia complications

## 2019-11-20 ENCOUNTER — Inpatient Hospital Stay (HOSPITAL_COMMUNITY): Payer: PPO

## 2019-11-20 DIAGNOSIS — K8050 Calculus of bile duct without cholangitis or cholecystitis without obstruction: Secondary | ICD-10-CM

## 2019-11-20 DIAGNOSIS — R0989 Other specified symptoms and signs involving the circulatory and respiratory systems: Secondary | ICD-10-CM

## 2019-11-20 DIAGNOSIS — I341 Nonrheumatic mitral (valve) prolapse: Secondary | ICD-10-CM

## 2019-11-20 DIAGNOSIS — I350 Nonrheumatic aortic (valve) stenosis: Secondary | ICD-10-CM

## 2019-11-20 DIAGNOSIS — J441 Chronic obstructive pulmonary disease with (acute) exacerbation: Secondary | ICD-10-CM

## 2019-11-20 DIAGNOSIS — R0902 Hypoxemia: Secondary | ICD-10-CM

## 2019-11-20 DIAGNOSIS — R062 Wheezing: Secondary | ICD-10-CM

## 2019-11-20 LAB — CBC
HCT: 36.2 % — ABNORMAL LOW (ref 39.0–52.0)
Hemoglobin: 11.5 g/dL — ABNORMAL LOW (ref 13.0–17.0)
MCH: 28.7 pg (ref 26.0–34.0)
MCHC: 31.8 g/dL (ref 30.0–36.0)
MCV: 90.3 fL (ref 80.0–100.0)
Platelets: 120 10*3/uL — ABNORMAL LOW (ref 150–400)
RBC: 4.01 MIL/uL — ABNORMAL LOW (ref 4.22–5.81)
RDW: 14.5 % (ref 11.5–15.5)
WBC: 13 10*3/uL — ABNORMAL HIGH (ref 4.0–10.5)
nRBC: 0 % (ref 0.0–0.2)

## 2019-11-20 LAB — COMPREHENSIVE METABOLIC PANEL
ALT: 88 U/L — ABNORMAL HIGH (ref 0–44)
AST: 66 U/L — ABNORMAL HIGH (ref 15–41)
Albumin: 2.4 g/dL — ABNORMAL LOW (ref 3.5–5.0)
Alkaline Phosphatase: 85 U/L (ref 38–126)
Anion gap: 12 (ref 5–15)
BUN: 45 mg/dL — ABNORMAL HIGH (ref 8–23)
CO2: 23 mmol/L (ref 22–32)
Calcium: 8 mg/dL — ABNORMAL LOW (ref 8.9–10.3)
Chloride: 104 mmol/L (ref 98–111)
Creatinine, Ser: 1.96 mg/dL — ABNORMAL HIGH (ref 0.61–1.24)
GFR calc Af Amer: 33 mL/min — ABNORMAL LOW (ref >60)
GFR calc non Af Amer: 29 mL/min — ABNORMAL LOW (ref >60)
Glucose, Bld: 124 mg/dL — ABNORMAL HIGH (ref 70–99)
Potassium: 3.3 mmol/L — ABNORMAL LOW (ref 3.5–5.1)
Sodium: 139 mmol/L (ref 135–145)
Total Bilirubin: 4.1 mg/dL — ABNORMAL HIGH (ref 0.3–1.2)
Total Protein: 6.1 g/dL — ABNORMAL LOW (ref 6.5–8.1)

## 2019-11-20 LAB — GLUCOSE, CAPILLARY
Glucose-Capillary: 114 mg/dL — ABNORMAL HIGH (ref 70–99)
Glucose-Capillary: 130 mg/dL — ABNORMAL HIGH (ref 70–99)
Glucose-Capillary: 155 mg/dL — ABNORMAL HIGH (ref 70–99)

## 2019-11-20 LAB — PROCALCITONIN: Procalcitonin: 12.04 ng/mL

## 2019-11-20 LAB — CREATININE, URINE, RANDOM: Creatinine, Urine: 171.63 mg/dL

## 2019-11-20 LAB — LIPASE, BLOOD: Lipase: 392 U/L — ABNORMAL HIGH (ref 11–51)

## 2019-11-20 LAB — ECHOCARDIOGRAM COMPLETE
Height: 69 in
Weight: 3696.67 oz

## 2019-11-20 LAB — BRAIN NATRIURETIC PEPTIDE: B Natriuretic Peptide: 186.6 pg/mL — ABNORMAL HIGH (ref 0.0–100.0)

## 2019-11-20 LAB — MAGNESIUM: Magnesium: 2 mg/dL (ref 1.7–2.4)

## 2019-11-20 MED ORDER — POTASSIUM CHLORIDE 20 MEQ/15ML (10%) PO SOLN
40.0000 meq | Freq: Once | ORAL | Status: AC
  Administered 2019-11-20: 40 meq via ORAL
  Filled 2019-11-20: qty 30

## 2019-11-20 MED ORDER — FUROSEMIDE 10 MG/ML IJ SOLN
40.0000 mg | Freq: Two times a day (BID) | INTRAMUSCULAR | Status: DC
  Administered 2019-11-20 – 2019-11-21 (×2): 40 mg via INTRAVENOUS
  Filled 2019-11-20 (×2): qty 4

## 2019-11-20 MED ORDER — METHYLPREDNISOLONE SODIUM SUCC 40 MG IJ SOLR
40.0000 mg | Freq: Four times a day (QID) | INTRAMUSCULAR | Status: DC
  Administered 2019-11-20 – 2019-11-22 (×10): 40 mg via INTRAVENOUS
  Filled 2019-11-20 (×10): qty 1

## 2019-11-20 NOTE — Progress Notes (Signed)
  Echocardiogram 2D Echocardiogram has been performed.  Akito Boomhower G Mikaylah Libbey 11/20/2019, 4:07 PM

## 2019-11-20 NOTE — Progress Notes (Signed)
[facility]  PROGRESS NOTE  David Morgan [id number] DOB: 1924-10-21 DOA: 11/18/2019 PCP: [name], MD Admit date - 11/18/2019   Admitting Physician Bernadette Hoit, DO  Outpatient Primary MD for the patient is [name], MD  LOS - 2 Brief Narrative   David Morgan is a 86 y.o. year old male with medical history significant for emphysematous COPD, HTN, HLD, hypothyroidism who presented on 11/18/2019 to [facility] ED with complaints of recurrent falls from home and worsening confusion per wife report. Upon EMS evaluation patient complained of abdominal pain and noted to have worsening breathing.  At [facility] ED patient was noted to be tachypneic, BP 164/76, afebrile with leukocytosis, lactic acidosis of 3.3, and AKI, creatinine 1.34, baseline 0.6-0.8, with elevated bilirubin of 4.6.  Underwent CT head and cervical spine given fall which was negative for acute findings. CT abdomen concerning for distended gallbladder with inflammatory changes worrisome for acute cholecystitis. MRCP .  General surgery was consulted in ED who recommended GI consultation.  GI recommended MRCP which showed distended gallbladder with stones and abnormal signal of CBD that could be sludge or artifact ( study was difficult).  Patient was transferred to [facility] for ERCP.  He received Zosyn in the ED    Subjective  Today has mild abdominal pain when palpated. Denies cough or problems breathing. Knows he's at [facility]    Elevated LFTs and hyperbilirubinemia and abdominal pain secondary to choledocholithiasis without cholecystitis, improving. Partial obstruction found on ERCP with biliary sphincterotomy and balloon extraction on 4/3. LFTs and bilirubin downtrending, abdominal pain mild. Remains afebrile and leukocytosis downtrending, was started on Zosyn on 4/3 due to concern for cholangitis.  -continuezosyn -monitor blood cultures -GI consulted, appreciated recommendations --will  consult EGS regarding cholelithiasis  Severe sepsis presumed secondary to choledocholithiasis with concern for cholangitis, resolved.  Afebrile, lactic acidosis resolved, leukocytosis downtrending. Blood cultures unremarkable to date.   Other sources of infection ruled out with unremarkable UA, ct abd and chest imaging -daily cbc -Zosyn -monitor blood cultures  AKI, worsening  Likely multifactorial large related to decreased p.o. intake, infectious etiology.  Previous baseline 0.6-0.92 (from 2019).  1.35 on admission now 1.95. S/p lasix for pulmonary edema on CXR. UA bland --check urine lytes ( fe Urea) --if continues to worsen in 24 hours will obtain renal ultrasound and consult nephrology --pending CXR will determine if needs IVF or more lasix -Avoid nephrotoxins -Daily BMP -Monitor output  Witnessed falls at home Suspect related to worsening confusion on top of dementia related to likely infection above. Trauma imaging negative for acute findings -PT/OT  Worsening confusion, with baseline dementia without behavioral disturbance. Suspect infectious encephalopathy. CT head neg for acute findings. Alert to self and place now -corroborate baseline with family -Home Aricept  COPD, acute exacerbation. On 2 L but with great O2 saturation of 99%. Has wheezing and diffuse rhonchi worse than yesterday.  -schedule duo-nebs, add IV steroids --repeat CXR --s/p IV lasix on 4/3 for mild pulmonary edema -wean O2 for goal spO2>88%  Pulmonary vascular congestion and mild pulmonary edema. On CXR on 4/3. Wheezing seems worse this am though O2 requirements are stable. Unsure if this is wheeze from COPD or CHF, given AKI will get CxR to ensure --repeat CXR, if vascular congestion will repeat IV lasix --Check BNP --may need TTE  Elevated high-sensitivity troponin, mild, resolved  Suspect demand ischemia in the setting of infectious etiology and severe sepsis.  Peak troponin of 30,  downtrending to 26.   A. fib on EKG with no ischemic changes -Closely monitor   Atrial fibrillation, new diagnosis? EKG on 4/2 documents A. fib.  No known history per chart review. Currently rate controlled -telemetry monitoring -Repeat EKG, check TSH  Hypertension, stable -Continue home Coreg -Holding HCTZ, losartan  Hypothyroidism -Continue home Synthroid  Type 2 diabetes, controlled. A1c 6.8% -Holding home metformin -Monitor CBGs, signs scale as needed  BPH, stable -Monitor output, continue home Flomax      Family Communication  :  None. Called wife Kia Gush at [phone] on 4/3.  Left a message.  Code Status : Full  Disposition Plan  :  Patient is from home. Anticipated d/c date: 2 to 3 days. Barriers to d/c or necessity for inpatient status:  IV Zosyn for presumed cholangitis in setting of concerning choledocholithiasis, needs undergo ERCP per GI Consults  : GI  Procedures  : ERCP, 4/3  DVT Prophylaxis  : SCDs  Lab Results  Component Value Date   PLT 120 (L) 11/20/2019    Diet :  Diet Order            Diet clear liquid Room service appropriate? Yes; Fluid consistency: Thin  Diet effective now               Inpatient Medications Scheduled Meds: . carvedilol  3.125 mg Oral BID WC  . donepezil  5 mg Oral Daily  . furosemide  20 mg Intravenous Once  . guaiFENesin  600 mg Oral BID  . indomethacin  100 mg Rectal Once  . insulin aspart  0-15 Units Subcutaneous TID WC  . insulin aspart  0-5 Units Subcutaneous QHS  . ipratropium-albuterol  3 mL Nebulization QID  . levothyroxine  112 mcg Oral Q0600  . methylPREDNISolone (SOLU-MEDROL) injection  40 mg Intravenous Q6H  . tamsulosin  0.4 mg Oral QPM   Continuous Infusions: . piperacillin-tazobactam (ZOSYN)  IV 3.375 g (11/20/19 1003)   PRN Meds:.morphine injection  Antibiotics  :   Anti-infectives (From admission, onward)   Start     Dose/Rate Route Frequency Ordered Stop   11/19/19 0815  piperacillin-tazobactam  (ZOSYN) IVPB 3.375 g     3.375 g 12.5 mL/hr over 240 Minutes Intravenous Every 8 hours 11/19/19 0806         Objective   Vitals:   11/20/19 0628 11/20/19 0853 11/20/19 0957 11/20/19 1003  BP: (!) 111/56  (!) 108/54 (!) 108/54  Pulse: 62  68 68  Resp: 19  (!) 23   Temp: 97.6 F (36.4 C)  97.6 F (36.4 C)   TempSrc: Oral  Oral   SpO2: 97% 98% 97%   Weight:      Height:        SpO2: 97 % O2 Flow Rate (L/min): 2 L/min FiO2 (%): 28 %  Wt Readings from Last 3 Encounters:  11/20/19 104.8 kg  11/18/19 104 kg  12/21/17 104.3 kg     Intake/Output Summary (Last 24 hours) at 11/20/2019 1020 Last data filed at 11/20/2019 0240 Gross per 24 hour  Intake 700 ml  Output 600 ml  Net 100 ml    Physical Exam:     Oriented to self , place ( [place] [facility]) but not to time, no distress No new F.N deficits,  [place].AT, Shallow inspiratory effort on 2 L, diffuse wheezing and rhonchi (worse than yesterday) Positive bowel sounds, Distended abdomen, soft, tenderness greatest in periumbilical and right quadrant. No guarding, no rigidity  or rebound tenderness RRR,No Gallops,Rubs or new Murmurs,  No edema   I have personally reviewed the following:   Data Reviewed:  CBC Recent Labs  Lab 11/18/19 1138 11/19/19 0255 11/20/19 0340  WBC 23.5* 26.4* 13.0*  HGB 14.6 14.0 11.5*  HCT 45.4 44.4 36.2*  PLT 175 162 120*  MCV 89.9 90.8 90.3  MCH 28.9 28.6 28.7  MCHC 32.2 31.5 31.8  RDW 14.1 14.3 14.5  LYMPHSABS 0.7  --   --   MONOABS 0.9  --   --   EOSABS 0.0  --   --   BASOSABS 0.0  --   --     Chemistries  Recent Labs  Lab 11/18/19 1138 11/19/19 0255 11/20/19 0340 11/20/19 0816  NA 134* 139 139  --   K 4.5 3.7 3.3*  --   CL 97* 99 104  --   CO2 23 24 23   --   GLUCOSE 386* 225* 124*  --   BUN 28* 25* 45*  --   CREATININE 1.34* 1.35* 1.96*  --   CALCIUM 9.1 9.0 8.0*  --   MG  --   --   --  2.0  AST 192* 109* 66*  --   ALT 174* 140* 88*  --   ALKPHOS 109 86 85  --    BILITOT 4.6* 5.0* 4.1*  --    ------------------------------------------------------------------------------------------------------------------ No results for input(s): CHOL, HDL, LDLCALC, TRIG, CHOLHDL, LDLDIRECT in the last 72 hours.  Lab Results  Component Value Date   HGBA1C 6.8 (H) 11/19/2019   ------------------------------------------------------------------------------------------------------------------ Recent Labs    11/19/19 1533  TSH 2.818   ------------------------------------------------------------------------------------------------------------------ No results for input(s): VITAMINB12, FOLATE, FERRITIN, TIBC, IRON, RETICCTPCT in the last 72 hours.  Coagulation profile Recent Labs  Lab 11/19/19 0255  INR 1.5*    No results for input(s): DDIMER in the last 72 hours.  Cardiac Enzymes No results for input(s): CKMB, TROPONINI, MYOGLOBIN in the last 168 hours.  Invalid input(s): CK ------------------------------------------------------------------------------------------------------------------ No results found for: BNP  Micro Results Recent Results (from the past 240 hour(s))  Blood culture (routine x 2)     Status: None (Preliminary result)   Collection Time: 11/18/19  4:02 PM   Specimen: BLOOD  Result Value Ref Range Status   Specimen Description BLOOD L HAND  Final   Special Requests   Final    BOTTLES DRAWN AEROBIC AND ANAEROBIC Blood Culture results may not be optimal due to an inadequate volume of blood received in culture bottles   Culture   Final    NO GROWTH 2 DAYS Performed at [facility], [address], [place], [place] [address]    Report Status PENDING  Incomplete  Blood culture (routine x 2)     Status: None (Preliminary result)   Collection Time: 11/18/19  4:04 PM   Specimen: BLOOD  Result Value Ref Range Status   Specimen Description BLOOD R HAND  Final   Special Requests   Final    BOTTLES DRAWN AEROBIC AND ANAEROBIC  Blood Culture results may not be optimal due to an inadequate volume of blood received in culture bottles   Culture   Final    NO GROWTH 2 DAYS Performed at [facility], [address] [place], [place] [address]    Report Status PENDING  Incomplete  Respiratory Panel by RT PCR (Flu A&B, Covid) - Nasopharyngeal Swab     Status: None   Collection Time: 11/18/19  6:18 PM  Specimen: Nasopharyngeal Swab  Result Value Ref Range Status   SARS Coronavirus 2 by RT PCR NEGATIVE NEGATIVE Final    Comment: (NOTE) SARS-CoV-2 target nucleic acids are NOT DETECTED. The SARS-CoV-2 RNA is generally detectable in upper respiratoy specimens during the acute phase of infection. The lowest concentration of SARS-CoV-2 viral copies this assay can detect is 131 copies/mL. A negative result does not preclude SARS-Cov-2 infection and should not be used as the sole basis for treatment or other patient management decisions. A negative result may occur with  improper specimen collection/handling, submission of specimen other than nasopharyngeal swab, presence of viral mutation(s) within the areas targeted by this assay, and inadequate number of viral copies (<131 copies/mL). A negative result must be combined with clinical observations, patient history, and epidemiological information. The expected result is Negative. Fact Sheet for Patients:  PinkCheek.be Fact Sheet for Healthcare Providers:  GravelBags.it This test is not yet ap proved or cleared by the [place] FDA and  has been authorized for detection and/or diagnosis of SARS-CoV-2 by FDA under an Emergency Use Authorization (EUA). This EUA will remain  in effect (meaning this test can be used) for the duration of the COVID-19 declaration under Section 564(b)(1) of the Act, 21 U.S.C. section 360bbb-3(b)(1), unless the authorization is terminated or revoked sooner.    Influenza A  by PCR NEGATIVE NEGATIVE Final   Influenza B by PCR NEGATIVE NEGATIVE Final    Comment: (NOTE) The Xpert Xpress SARS-CoV-2/FLU/RSV assay is intended as an aid in  the diagnosis of influenza from Nasopharyngeal swab specimens and  should not be used as a sole basis for treatment. Nasal washings and  aspirates are unacceptable for Xpert Xpress SARS-CoV-2/FLU/RSV  testing. Fact Sheet for Patients: PinkCheek.be Fact Sheet for Healthcare Providers: GravelBags.it This test is not yet approved or cleared by the [place] FDA and  has been authorized for detection and/or diagnosis of SARS-CoV-2 by  FDA under an Emergency Use Authorization (EUA). This EUA will remain  in effect (meaning this test can be used) for the duration of the  Covid-19 declaration under Section 564(b)(1) of the Act, 21  U.S.C. section 360bbb-3(b)(1), unless the authorization is  terminated or revoked. Performed at [facility], [address], [place], [place] [address]     Radiology Reports CT Head Wo Contrast  Result Date: 11/18/2019 CLINICAL DATA:  [place] at home yesterday. [place] again this morning. Confusion. EXAM: CT HEAD WITHOUT CONTRAST CT CERVICAL SPINE WITHOUT CONTRAST TECHNIQUE: Multidetector CT imaging of the head and cervical spine was performed following the standard protocol without intravenous contrast. Multiplanar CT image reconstructions of the cervical spine were also generated. COMPARISON:  Head CT 12/21/2017 FINDINGS: CT HEAD FINDINGS Brain: Stable age related significant cerebral atrophy, ventriculomegaly and periventricular white matter disease. No extra-axial fluid collections are identified. No CT findings for acute hemispheric infarction or intracranial hemorrhage. No mass lesions. The brainstem and cerebellum are normal. Vascular: Stable vascular calcifications. No hyperdense vessels or obvious aneurysm. Skull: No skull fracture or  bone lesions. Sinuses/Orbits: The paranasal sinuses and mastoid air cells are clear. The globes are intact. Other: No scalp lesions or obvious laceration or foreign body. CT CERVICAL SPINE FINDINGS Alignment: Normal overall alignment. Mild degenerative anterior subluxation of C7 compared to T1. The facet joints are fused at this disc space level. Skull base and vertebrae: No acute fracture. No primary bone lesion or focal pathologic process. Soft tissues and spinal canal: No prevertebral fluid or swelling. No visible  canal hematoma. Disc levels: The spinal canal is fairly generous. No significant spinal stenosis. Multilevel facet disease and mild uncinate spurring changes contributing to mild multilevel bony foraminal narrowing. Upper chest: The lung apices are grossly clear. Other: No neck mass or adenopathy. Carotid artery calcifications are noted. IMPRESSION: 1. Stable age related cerebral atrophy, ventriculomegaly and periventricular white matter disease. 2. No acute intracranial findings or skull fracture. 3. Degenerative cervical spondylosis with multilevel disc disease, facet disease and uncinate spurring changes but no acute cervical spine fracture. Electronically Signed   By: Marijo Sanes M.D.   On: 11/18/2019 13:19   CT Angio Chest PE W and/or Wo Contrast  Result Date: 11/18/2019 CLINICAL DATA:  86 year old male with abdominal trauma EXAM: CT ANGIOGRAPHY CHEST CT ABDOMEN AND PELVIS WITH CONTRAST TECHNIQUE: Multidetector CT imaging of the chest was performed using the standard protocol during bolus administration of intravenous contrast. Multiplanar CT image reconstructions and MIPs were obtained to evaluate the vascular anatomy. Multidetector CT imaging of the abdomen and pelvis was performed using the standard protocol during bolus administration of intravenous contrast. CONTRAST:  59mL OMNIPAQUE IOHEXOL 350 MG/ML SOLN COMPARISON:  09/18/2015 FINDINGS: CTA CHEST FINDINGS Cardiovascular: Heart: No  cardiomegaly. No pericardial fluid/thickening. Calcifications of left main, left anterior descending, circumflex, right coronary arteries. Aorta: Calcifications of the aortic valve. Unremarkable course caliber and contour of the thoracic aorta. Moderate atherosclerosis of the aorta including some irregular intimal plaque of the sending thoracic aorta. No ulcerated plaque or evidence of dissection. Pulmonary arteries: No central, lobar, segmental, or proximal subsegmental filling defects. Mediastinum/Nodes: Multiple small mediastinal lymph nodes throughout all nodal stations. Unremarkable appearance of the thoracic esophagus. Unremarkable thoracic inlet Lungs/Pleura: Centrilobular and paraseptal emphysema. Respiratory motion limits evaluation for small nodules. No pneumothorax or pleural effusion. No confluent airspace disease. Review of the MIP images confirms the above findings. CT ABDOMEN and PELVIS FINDINGS Hepatobiliary: Unremarkable liver. Gallbladder is distended with radiopaque stones in the neck of the gallbladder. No significant pericholecystic fluid. Mild inflammatory changes of the gallbladder wall and the adjacent fat. No definite radiopaque stones identified in the region of the common bile duct. Pancreas: Unremarkable Spleen: Unremarkable Adrenals/Urinary Tract: - Right adrenal gland:  Unremarkable - Left adrenal gland: Unremarkable. - Right kidney: No hydronephrosis, nephrolithiasis, inflammation, or ureteral dilation. Hypodense lesion on the posterior cortex of the right kidney is too small to characterize. Hyperdense focal lesion on the lateral cortex of the right kidney was present on the comparison CT and most likely represents a complex cyst given the decreasing size though is strictly too small to characterize. - Left Kidney: No hydronephrosis, nephrolithiasis, inflammation, or ureteral dilation. No focal lesion. - Urinary Bladder: Urinary bladder relatively decompressed. Stomach/Bowel: -  Stomach: Small hiatal hernia.  Otherwise unremarkable stomach. - Small bowel: Unremarkable - Appendix: Normal. - Colon: Colonic diverticula of the sigmoid colon and the left colon. No focal inflammatory changes. No bowel obstruction. Mild stool burden. Vascular/Lymphatic: Atherosclerosis of the abdominal aorta. No periaortic fluid or inflammatory changes. Greatest diameter of the aorta is estimated 3.4 cm. Irregular plaque versus chronic dissection just above the bifurcation. Atherosclerotic changes of the bilateral iliac arteries. Bilateral iliac arteries are patent. Proximal femoral arteries are patent. Reproductive: Calcifications of the prostate. Other: Fat containing umbilical hernia. Musculoskeletal: Osteopenia. Degenerative changes of the visualized spine no acute displaced vertebral body fracture. Kyphotic deformity of the thoracic spine likely degenerative. No bony canal narrowing.  No aggressive bony lesions identified. Treatment changes of prior vertebral augmentation of L3.  Surgical changes of prior left femur repair. Review of the MIP images confirms the above findings. IMPRESSION: No acute finding of the chest CT angiogram. Cholelithiasis with distended gallbladder and vague inflammatory changes of the gallbladder wall. These findings may reflect early acute cholecystitis. Correlation with lab values and patient presentation may be useful, as well as consideration of confirmatory HIDA study. Ultrasound will likely yield no further useful imaging information. Emphysema (ICD10-J43.9). Aortic Atherosclerosis (ICD10-I70.0). Associated coronary artery disease, bilateral iliac arterial disease, irregular plaque of the abdominal aorta. Diverticular disease without evidence of acute diverticulitis. Additional ancillary findings as above. Electronically Signed   By: Corrie Mckusick D.O.   On: 11/18/2019 13:26   CT Cervical Spine Wo Contrast  Result Date: 11/18/2019 CLINICAL DATA:  [place] at home yesterday. [place]  again this morning. Confusion. EXAM: CT HEAD WITHOUT CONTRAST CT CERVICAL SPINE WITHOUT CONTRAST TECHNIQUE: Multidetector CT imaging of the head and cervical spine was performed following the standard protocol without intravenous contrast. Multiplanar CT image reconstructions of the cervical spine were also generated. COMPARISON:  Head CT 12/21/2017 FINDINGS: CT HEAD FINDINGS Brain: Stable age related significant cerebral atrophy, ventriculomegaly and periventricular white matter disease. No extra-axial fluid collections are identified. No CT findings for acute hemispheric infarction or intracranial hemorrhage. No mass lesions. The brainstem and cerebellum are normal. Vascular: Stable vascular calcifications. No hyperdense vessels or obvious aneurysm. Skull: No skull fracture or bone lesions. Sinuses/Orbits: The paranasal sinuses and mastoid air cells are clear. The globes are intact. Other: No scalp lesions or obvious laceration or foreign body. CT CERVICAL SPINE FINDINGS Alignment: Normal overall alignment. Mild degenerative anterior subluxation of C7 compared to T1. The facet joints are fused at this disc space level. Skull base and vertebrae: No acute fracture. No primary bone lesion or focal pathologic process. Soft tissues and spinal canal: No prevertebral fluid or swelling. No visible canal hematoma. Disc levels: The spinal canal is fairly generous. No significant spinal stenosis. Multilevel facet disease and mild uncinate spurring changes contributing to mild multilevel bony foraminal narrowing. Upper chest: The lung apices are grossly clear. Other: No neck mass or adenopathy. Carotid artery calcifications are noted. IMPRESSION: 1. Stable age related cerebral atrophy, ventriculomegaly and periventricular white matter disease. 2. No acute intracranial findings or skull fracture. 3. Degenerative cervical spondylosis with multilevel disc disease, facet disease and uncinate spurring changes but no acute cervical  spine fracture. Electronically Signed   By: Marijo Sanes M.D.   On: 11/18/2019 13:19   CT ABDOMEN PELVIS W CONTRAST  Result Date: 11/18/2019 CLINICAL DATA:  86 year old male with abdominal trauma EXAM: CT ANGIOGRAPHY CHEST CT ABDOMEN AND PELVIS WITH CONTRAST TECHNIQUE: Multidetector CT imaging of the chest was performed using the standard protocol during bolus administration of intravenous contrast. Multiplanar CT image reconstructions and MIPs were obtained to evaluate the vascular anatomy. Multidetector CT imaging of the abdomen and pelvis was performed using the standard protocol during bolus administration of intravenous contrast. CONTRAST:  74mL OMNIPAQUE IOHEXOL 350 MG/ML SOLN COMPARISON:  09/18/2015 FINDINGS: CTA CHEST FINDINGS Cardiovascular: Heart: No cardiomegaly. No pericardial fluid/thickening. Calcifications of left main, left anterior descending, circumflex, right coronary arteries. Aorta: Calcifications of the aortic valve. Unremarkable course caliber and contour of the thoracic aorta. Moderate atherosclerosis of the aorta including some irregular intimal plaque of the sending thoracic aorta. No ulcerated plaque or evidence of dissection. Pulmonary arteries: No central, lobar, segmental, or proximal subsegmental filling defects. Mediastinum/Nodes: Multiple small mediastinal lymph nodes throughout all nodal stations. Unremarkable appearance  of the thoracic esophagus. Unremarkable thoracic inlet Lungs/Pleura: Centrilobular and paraseptal emphysema. Respiratory motion limits evaluation for small nodules. No pneumothorax or pleural effusion. No confluent airspace disease. Review of the MIP images confirms the above findings. CT ABDOMEN and PELVIS FINDINGS Hepatobiliary: Unremarkable liver. Gallbladder is distended with radiopaque stones in the neck of the gallbladder. No significant pericholecystic fluid. Mild inflammatory changes of the gallbladder wall and the adjacent fat. No definite radiopaque  stones identified in the region of the common bile duct. Pancreas: Unremarkable Spleen: Unremarkable Adrenals/Urinary Tract: - Right adrenal gland:  Unremarkable - Left adrenal gland: Unremarkable. - Right kidney: No hydronephrosis, nephrolithiasis, inflammation, or ureteral dilation. Hypodense lesion on the posterior cortex of the right kidney is too small to characterize. Hyperdense focal lesion on the lateral cortex of the right kidney was present on the comparison CT and most likely represents a complex cyst given the decreasing size though is strictly too small to characterize. - Left Kidney: No hydronephrosis, nephrolithiasis, inflammation, or ureteral dilation. No focal lesion. - Urinary Bladder: Urinary bladder relatively decompressed. Stomach/Bowel: - Stomach: Small hiatal hernia.  Otherwise unremarkable stomach. - Small bowel: Unremarkable - Appendix: Normal. - Colon: Colonic diverticula of the sigmoid colon and the left colon. No focal inflammatory changes. No bowel obstruction. Mild stool burden. Vascular/Lymphatic: Atherosclerosis of the abdominal aorta. No periaortic fluid or inflammatory changes. Greatest diameter of the aorta is estimated 3.4 cm. Irregular plaque versus chronic dissection just above the bifurcation. Atherosclerotic changes of the bilateral iliac arteries. Bilateral iliac arteries are patent. Proximal femoral arteries are patent. Reproductive: Calcifications of the prostate. Other: Fat containing umbilical hernia. Musculoskeletal: Osteopenia. Degenerative changes of the visualized spine no acute displaced vertebral body fracture. Kyphotic deformity of the thoracic spine likely degenerative. No bony canal narrowing.  No aggressive bony lesions identified. Treatment changes of prior vertebral augmentation of L3. Surgical changes of prior left femur repair. Review of the MIP images confirms the above findings. IMPRESSION: No acute finding of the chest CT angiogram. Cholelithiasis with  distended gallbladder and vague inflammatory changes of the gallbladder wall. These findings may reflect early acute cholecystitis. Correlation with lab values and patient presentation may be useful, as well as consideration of confirmatory HIDA study. Ultrasound will likely yield no further useful imaging information. Emphysema (ICD10-J43.9). Aortic Atherosclerosis (ICD10-I70.0). Associated coronary artery disease, bilateral iliac arterial disease, irregular plaque of the abdominal aorta. Diverticular disease without evidence of acute diverticulitis. Additional ancillary findings as above. Electronically Signed   By: Corrie Mckusick D.O.   On: 11/18/2019 13:26   MR 3D Recon At Scanner  Result Date: 11/18/2019 CLINICAL DATA:  Cholelithiasis with elevated bilirubin. EXAM: MRI ABDOMEN WITHOUT AND WITH CONTRAST (INCLUDING MRCP) TECHNIQUE: Multiplanar multisequence MR imaging of the abdomen was performed both before and after the administration of intravenous contrast. Heavily T2-weighted images of the biliary and pancreatic ducts were obtained, and three-dimensional MRCP images were rendered by post processing. CONTRAST:  62mL GADAVIST GADOBUTROL 1 MMOL/ML IV SOLN COMPARISON:  CT scan 11/18/2019 FINDINGS: Motion degraded study due to patient inability to reproducibly breath hold. Lower chest: Unremarkable. Hepatobiliary: 8 mm cyst noted inferior right liver. Gallbladder is distended with layering tiny gallstones. Common duct measures 7 mm diameter. There is abnormal signal in the distal common bile duct. This is not well evaluated due to motion artifact and could represent sludge impacted in the distal common bile duct although tiny layering/stacked gallstones cannot be excluded. Pancreas: Limited assessment due to motion degradation on postcontrast imaging. No  focal mass lesion. No dilatation of the main duct. No intraparenchymal cyst. No peripancreatic edema. Spleen:  No gross abnormality. Adrenals/Urinary Tract: No  adrenal nodule or mass. Bilateral renal cysts. Exophytic 10 mm complex cystic lesion in the interpolar right kidney (image 27/3 and image 330/18). This is not well evaluated due to motion degradation but comparing today's CT to a CT scan from 09/18/2015, this lesion has decreased in size in the interval consistent with benign etiology. Stomach/Bowel: Stomach is moderately distended. No small bowel or colonic dilatation within the visualized abdomen. Vascular/Lymphatic: Atherosclerotic changes noted in the abdominal aorta. No abdominal lymphadenopathy. Other:  No intraperitoneal free fluid. Musculoskeletal: No suspicious marrow enhancement within the visualized bony anatomy. IMPRESSION: 1. Motion degraded study due to patient inability to reproducibly breath hold. 2. There is abnormal signal in the 7 mm diameter distal common bile duct with loss of normal T2 hyperintensity. This is not well evaluated due to motion artifact and could represent sludge or stacked gallstones. No suspicious enhancement to suggest soft tissue mass, but follow-up likely warranted. 3. Cholelithiasis. 4. Right renal cysts. Electronically Signed   By: Misty Stanley M.D.   On: 11/18/2019 16:04   DG CHEST PORT 1 VIEW  Result Date: 11/19/2019 CLINICAL DATA:  86 year old male with shortness of breath EXAM: PORTABLE CHEST 1 VIEW COMPARISON:  Prior chest x-ray 09/05/2015 FINDINGS: Very low inspiratory volumes. Pulmonary vascular congestion is present bordering on mild edema. Nonspecific patchy bibasilar airspace opacities. Cardiac and mediastinal contours are within normal limits. No pneumothorax. No acute osseous abnormality. IMPRESSION: 1. Pulmonary vascular congestion bordering on mild edema. 2. Very low inspiratory volumes. 3. Nonspecific bibasilar patchy airspace opacities which could reflect atelectasis, dependent edema or infiltrate. Electronically Signed   By: Jacqulynn Cadet M.D.   On: 11/19/2019 16:18   DG ERCP  Result Date:  11/19/2019 CLINICAL DATA:  86 year old male with choledocholithiasis EXAM: ERCP TECHNIQUE: Multiple spot images obtained with the fluoroscopic device and submitted for interpretation post-procedure. FLUOROSCOPY TIME:  Fluoroscopy Time:  12 minutes 57 seconds COMPARISON:  MRCP 11/18/2019 FINDINGS: A total of 4 intraoperative saved images are submitted for review. The images demonstrate a flexible endoscope in the descending duodenum with wire cannulation of the common bile duct. Retrograde cholangiogram was then performed. The images demonstrate multiple small filling defects in the common bile duct consistent with reported history of choledocholithiasis. The cystic duct is patent. Opacification of the gallbladder demonstrates that it is filled with numerous filling defects consistent with small stones. No significant biliary ductal dilatation. On the final image, the common bile duct appears clear and a small amount of contrast is seen within the duodenum. IMPRESSION: 1. Cholelithiasis. 2. Choledocholithiasis. These images were submitted for radiologic interpretation only. Please see the procedural report for the amount of contrast and the fluoroscopy time utilized. Electronically Signed   By: Jacqulynn Cadet M.D.   On: 11/19/2019 15:43   MR ABDOMEN MRCP W WO CONTAST  Result Date: 11/18/2019 CLINICAL DATA:  Cholelithiasis with elevated bilirubin. EXAM: MRI ABDOMEN WITHOUT AND WITH CONTRAST (INCLUDING MRCP) TECHNIQUE: Multiplanar multisequence MR imaging of the abdomen was performed both before and after the administration of intravenous contrast. Heavily T2-weighted images of the biliary and pancreatic ducts were obtained, and three-dimensional MRCP images were rendered by post processing. CONTRAST:  15mL GADAVIST GADOBUTROL 1 MMOL/ML IV SOLN COMPARISON:  CT scan 11/18/2019 FINDINGS: Motion degraded study due to patient inability to reproducibly breath hold. Lower chest: Unremarkable. Hepatobiliary: 8 mm cyst  noted inferior  right liver. Gallbladder is distended with layering tiny gallstones. Common duct measures 7 mm diameter. There is abnormal signal in the distal common bile duct. This is not well evaluated due to motion artifact and could represent sludge impacted in the distal common bile duct although tiny layering/stacked gallstones cannot be excluded. Pancreas: Limited assessment due to motion degradation on postcontrast imaging. No focal mass lesion. No dilatation of the main duct. No intraparenchymal cyst. No peripancreatic edema. Spleen:  No gross abnormality. Adrenals/Urinary Tract: No adrenal nodule or mass. Bilateral renal cysts. Exophytic 10 mm complex cystic lesion in the interpolar right kidney (image 27/3 and image 330/18). This is not well evaluated due to motion degradation but comparing today's CT to a CT scan from 09/18/2015, this lesion has decreased in size in the interval consistent with benign etiology. Stomach/Bowel: Stomach is moderately distended. No small bowel or colonic dilatation within the visualized abdomen. Vascular/Lymphatic: Atherosclerotic changes noted in the abdominal aorta. No abdominal lymphadenopathy. Other:  No intraperitoneal free fluid. Musculoskeletal: No suspicious marrow enhancement within the visualized bony anatomy. IMPRESSION: 1. Motion degraded study due to patient inability to reproducibly breath hold. 2. There is abnormal signal in the 7 mm diameter distal common bile duct with loss of normal T2 hyperintensity. This is not well evaluated due to motion artifact and could represent sludge or stacked gallstones. No suspicious enhancement to suggest soft tissue mass, but follow-up likely warranted. 3. Cholelithiasis. 4. Right renal cysts. Electronically Signed   By: Misty Stanley M.D.   On: 11/18/2019 16:04     Time Spent in minutes  30     Desiree Hane M.D on 11/20/2019 at 10:20 AM  To page go to [email or web address] - password [facility]

## 2019-11-20 NOTE — NC FL2 (Signed)
[place] LEVEL OF CARE SCREENING TOOL     IDENTIFICATION  Patient Name: David Morgan Birthdate: 01-20-1925 Sex: male Admission Date (Current Location): 11/18/2019  [facility] and [place] Number:  Engineering geologist and Address:  [facility], [place] [address], [place], [place] [address]      Provider Number:    Attending Physician Name and Address:  Desiree Hane, MD  Relative Name and Phone Number:  Truong Milham  [phone]    Current Level of Care: Hospital Recommended Level of Care: [facility] Prior Approval Number:    Date Approved/Denied:   PASRR Number: [id number]  Discharge Plan: SNF    Current Diagnoses: Patient Active Problem List   Diagnosis Date Noted  . Pulmonary vascular congestion 11/20/2019  . Choledocholithiasis   . Dementia without behavioral disturbance ([place]) 11/19/2019  . Accident due to mechanical fall without injury 11/19/2019  . Hyperglycemia due to type 2 diabetes mellitus ([place]) 11/19/2019  . Leukocytosis 11/19/2019  . AKI (acute kidney injury) ([place]) 11/19/2019  . Troponin I above reference range 11/19/2019  . COPD with acute exacerbation ([place]) 11/19/2019  . Severe sepsis ([place]) 11/19/2019  . Elevated liver enzymes 11/18/2019  . Cholelithiasis 11/18/2019  . Pressure injury of skin 12/22/2017  . Back pain 12/21/2017  . History of diverticulitis 05/02/2016  . Difficulty walking 02/04/2016  . Tremor 02/04/2016  . Weakness 02/04/2016  . Disease of thyroid gland 07/10/2015  . BP (high blood pressure) 07/10/2015  . HLD (hyperlipidemia) 07/10/2015  . BPH (benign prostatic hyperplasia) 08/02/2014    Orientation RESPIRATION BLADDER Height & Weight     Self  O2(Nasal Cannual 2/L) External catheter, Incontinent Weight: 231 lb 0.7 oz (104.8 kg) Height:  5\' 9"  (175.3 cm)  BEHAVIORAL SYMPTOMS/MOOD NEUROLOGICAL BOWEL NUTRITION STATUS      Continent Diet(please see discharge summary)   AMBULATORY STATUS COMMUNICATION OF NEEDS Skin     Verbally Normal                       Personal Care Assistance Level of Assistance              Functional Limitations Info  Sight, Hearing, Speech Sight Info: Adequate Hearing Info: Impaired Speech Info: Adequate    SPECIAL CARE FACTORS FREQUENCY  PT (By licensed PT), OT (By licensed OT)     PT Frequency: 3x per week OT Frequency: 3x per week            Contractures Contractures Info: Not present    Additional Factors Info  Code Status, Allergies, Insulin Sliding Scale Code Status Info: FULL Allergies Info: Adhesive,Augmentin   Insulin Sliding Scale Info: insulin aspart (novoLOG) injection 0-15 Units ,insulin aspart (novoLOG) injection 0-5 Units       Current Medications (11/20/2019):  This is the current hospital active medication list Current Facility-Administered Medications  Medication Dose Route Frequency Provider Last Rate Last Admin  . carvedilol (COREG) tablet 3.125 mg  3.125 mg Oral BID WC Gatha Mayer, MD   3.125 mg at 11/20/19 1003  . donepezil (ARICEPT) tablet 5 mg  5 mg Oral Daily Gatha Mayer, MD   5 mg at 11/20/19 1003  . furosemide (LASIX) injection 20 mg  20 mg Intravenous Once Desiree Hane, MD   Stopped at 11/19/19 2031  . guaiFENesin (MUCINEX) 12 hr tablet 600 mg  600 mg Oral BID Desiree Hane, MD   600 mg  at 11/20/19 1003  . indomethacin (INDOCIN) 50 MG suppository 100 mg  100 mg Rectal Once Willia Craze, NP      . insulin aspart (novoLOG) injection 0-15 Units  0-15 Units Subcutaneous TID WC Gatha Mayer, MD   2 Units at 11/19/19 1754  . insulin aspart (novoLOG) injection 0-5 Units  0-5 Units Subcutaneous QHS Gatha Mayer, MD      . ipratropium-albuterol (DUONEB) 0.5-2.5 (3) MG/3ML nebulizer solution 3 mL  3 mL Nebulization QID Oretha Milch D, MD   3 mL at 11/20/19 1203  . levothyroxine (SYNTHROID) tablet 112 mcg  112 mcg Oral Q0600 Gatha Mayer, MD   112 mcg at  11/20/19 0631  . methylPREDNISolone sodium succinate (SOLU-MEDROL) 40 mg/mL injection 40 mg  40 mg Intravenous Q6H Nettey, Myrlene Broker D, MD      . morphine 2 MG/ML injection 1 mg  1 mg Intravenous Q4H PRN Gatha Mayer, MD      . piperacillin-tazobactam (ZOSYN) IVPB 3.375 g  3.375 g Intravenous Q8H Gatha Mayer, MD 12.5 mL/hr at 11/20/19 1003 3.375 g at 11/20/19 1003  . tamsulosin (FLOMAX) capsule 0.4 mg  0.4 mg Oral QPM Gatha Mayer, MD   0.4 mg at 11/19/19 1753     Discharge Medications: Please see discharge summary for a list of discharge medications.  Relevant Imaging Results:  Relevant Lab Results:   Additional Information SSN [id number]  Vinie Sill, LCSWA

## 2019-11-20 NOTE — TOC Initial Note (Signed)
Transition of Care [facility]) - Initial/Assessment Note    Patient Details  Name: David Morgan MRN: [id number] Date of Birth: Oct 01, 1924  [facility] CM/SW Contact:    Vinie Sill, [place] Phone Number: 11/20/2019, 11:47 AM  Clinical Narrative:                  CSW spoke with the patient's wife by phone. CSW introduced self and explained role. CSW discussed PT recommendation of [facility]hab [facility]. Patient spouse they live in the home alone. She expressed she is unable to provided the level of care needed at this time. She shared that [place] runs in the patient's family she is beginning to explore his long term care needs.  Patient's spouse agrees with discharge plan of ST rehab at [facility]. She states her preference is [facility], patient has been there before. Her second SNF choice is [facility] [place]. CSW explained the SNF process. Patient's spouse states no questions or further concerns at this time.  CSW will follow and continue to assist with discharge planning.  CSW will provide bed offers once available.  Thurmond Butts, MSW, [place] Clinical Social Worker   Expected Discharge Plan: [place]     Patient Goals and CMS Choice     Choice offered to / list presented to : Spouse  Expected Discharge Plan and Services Expected Discharge Plan: [place] In-house Referral: Clinical Social Work     Living arrangements for the past 2 months: [place]                                      Prior Living Arrangements/Services Living arrangements for the past 2 months: Single Family Home Lives with:: Self, Spouse Patient language and need for interpreter reviewed:: No        Need for Family Participation in Patient Care: Yes (Comment) Care giver support system in place?: Yes (comment)   Criminal Activity/Legal Involvement Pertinent to Current Situation/Hospitalization: No - Comment as  needed  Activities of Daily Living      Permission Sought/Granted Permission sought to share information with : Family Supports, Customer service manager, Case Manager Permission granted to share information with : Yes, Verbal Permission Granted  Share Information with NAME: [place]  Permission granted to share info w AGENCY: SNFs  Permission granted to share info w Relationship: spouse  Permission granted to share info w Contact Information: [phone]  Emotional Assessment       Orientation: : Oriented to Self Alcohol / Substance Use: Not Applicable Psych Involvement: No (comment)  Admission diagnosis:  Cholelithiasis [K80.20] Patient Active Problem List   Diagnosis Date Noted  . Pulmonary vascular congestion 11/20/2019  . Dementia without behavioral disturbance ([place]) 11/19/2019  . Accident due to mechanical fall without injury 11/19/2019  . Hyperglycemia due to type 2 diabetes mellitus ([place]) 11/19/2019  . Leukocytosis 11/19/2019  . AKI (acute kidney injury) ([place]) 11/19/2019  . Troponin I above reference range 11/19/2019  . COPD with acute exacerbation ([place]) 11/19/2019  . Severe sepsis ([place]) 11/19/2019  . Elevated liver enzymes 11/18/2019  . Cholelithiasis 11/18/2019  . Pressure injury of skin 12/22/2017  . Back pain 12/21/2017  . History of diverticulitis 05/02/2016  . Difficulty walking 02/04/2016  . Tremor 02/04/2016  . Weakness 02/04/2016  . Disease of thyroid gland 07/10/2015  . BP (high blood pressure) 07/10/2015  .  HLD (hyperlipidemia) 07/10/2015  . BPH (benign prostatic hyperplasia) 08/02/2014   PCP:  [name], MD Pharmacy:   [facility] [address], [place] [address] Phone: [phone] Fax: [place], [place] [address] [address] Phone: [phone] Fax: [place], [place] [address] Phone: [phone] Fax: [phone]     Social Determinants of Health (SDOH) Interventions    Readmission Risk Interventions No flowsheet data found.

## 2019-11-20 NOTE — Progress Notes (Signed)
   Patient Name: David Morgan Date of Encounter: 11/20/2019, 11:50 AM    Subjective  Are you hungry? "I don't know what I want"   Objective  BP (!) 109/57 (BP Location: Left Arm)   Pulse 71   Temp 98.6 F (37 C) (Oral)   Resp 17   Ht 5\' 9"  (1.753 m)   Wt 104.8 kg   SpO2 98%   BMI 34.12 kg/m  Elderly NAD abd protuberant w/ active BS - mildly tender at best - variable and not reproducible locations of epigastric and RLQ  Lab Results  Component Value Date   ALT 88 (H) 11/20/2019   AST 66 (H) 11/20/2019   ALKPHOS 85 11/20/2019   BILITOT 4.1 (H) 11/20/2019   Lab Results  Component Value Date   LIPASE 392 (H) 11/20/2019   Lab Results  Component Value Date   WBC 13.0 (H) 11/20/2019   HGB 11.5 (L) 11/20/2019   HCT 36.2 (L) 11/20/2019   MCV 90.3 11/20/2019   PLT 120 (L) 11/20/2019       Assessment and Plan  Choledocholithiasis ? cholangitis Cholelithiasis  Improved bili, WBC, LFT's after ERCP His lipase is elevated but I had a wire in pancreatic duct so not surprised and do not think he has post-ERCP pancreatitis   rec trend the labs and continue current care Would leave on clears for now Will recheck tomorrow to make sure improvement continues Await surgery eval re: cholelithiasis  Gatha Mayer, MD, [facility] 11/20/2019 11:50 AM

## 2019-11-20 NOTE — Plan of Care (Signed)
  Problem: Health Behavior/Discharge Planning: Goal: Ability to manage health-related needs will improve Outcome: Not Progressing   Problem: Activity: Goal: Risk for activity intolerance will decrease Outcome: Not Progressing   

## 2019-11-21 ENCOUNTER — Encounter (HOSPITAL_COMMUNITY): Payer: Self-pay | Admitting: Internal Medicine

## 2019-11-21 DIAGNOSIS — K8050 Calculus of bile duct without cholangitis or cholecystitis without obstruction: Secondary | ICD-10-CM

## 2019-11-21 DIAGNOSIS — I5031 Acute diastolic (congestive) heart failure: Secondary | ICD-10-CM

## 2019-11-21 LAB — GLUCOSE, CAPILLARY
Glucose-Capillary: 221 mg/dL — ABNORMAL HIGH (ref 70–99)
Glucose-Capillary: 201 mg/dL — ABNORMAL HIGH (ref 70–99)
Glucose-Capillary: 225 mg/dL — ABNORMAL HIGH (ref 70–99)
Glucose-Capillary: 259 mg/dL — ABNORMAL HIGH (ref 70–99)
Glucose-Capillary: 207 mg/dL — ABNORMAL HIGH (ref 70–99)
Glucose-Capillary: 234 mg/dL — ABNORMAL HIGH (ref 70–99)

## 2019-11-21 LAB — COMPREHENSIVE METABOLIC PANEL
ALT: 78 U/L — ABNORMAL HIGH (ref 0–44)
AST: 46 U/L — ABNORMAL HIGH (ref 15–41)
Albumin: 2.5 g/dL — ABNORMAL LOW (ref 3.5–5.0)
Alkaline Phosphatase: 128 U/L — ABNORMAL HIGH (ref 38–126)
Anion gap: 13 (ref 5–15)
BUN: 47 mg/dL — ABNORMAL HIGH (ref 8–23)
CO2: 23 mmol/L (ref 22–32)
Calcium: 8.1 mg/dL — ABNORMAL LOW (ref 8.9–10.3)
Chloride: 104 mmol/L (ref 98–111)
Creatinine, Ser: 1.49 mg/dL — ABNORMAL HIGH (ref 0.61–1.24)
GFR calc Af Amer: 47 mL/min — ABNORMAL LOW (ref >60)
GFR calc non Af Amer: 40 mL/min — ABNORMAL LOW (ref >60)
Glucose, Bld: 234 mg/dL — ABNORMAL HIGH (ref 70–99)
Potassium: 3.8 mmol/L (ref 3.5–5.1)
Sodium: 140 mmol/L (ref 135–145)
Total Bilirubin: 2.5 mg/dL — ABNORMAL HIGH (ref 0.3–1.2)
Total Protein: 7 g/dL (ref 6.5–8.1)

## 2019-11-21 LAB — CBC
HCT: 40.1 % (ref 39.0–52.0)
Hemoglobin: 13 g/dL (ref 13.0–17.0)
MCH: 28.7 pg (ref 26.0–34.0)
MCHC: 32.4 g/dL (ref 30.0–36.0)
MCV: 88.5 fL (ref 80.0–100.0)
Platelets: 145 10*3/uL — ABNORMAL LOW (ref 150–400)
RBC: 4.53 MIL/uL (ref 4.22–5.81)
RDW: 14.4 % (ref 11.5–15.5)
WBC: 6.9 10*3/uL (ref 4.0–10.5)
nRBC: 0 % (ref 0.0–0.2)

## 2019-11-21 LAB — UREA NITROGEN, URINE: Urea Nitrogen, Ur: 772 mg/dL

## 2019-11-21 LAB — LIPASE, BLOOD: Lipase: 37 U/L (ref 11–51)

## 2019-11-21 MED ORDER — FUROSEMIDE 10 MG/ML IJ SOLN
40.0000 mg | Freq: Every day | INTRAMUSCULAR | Status: DC
  Administered 2019-11-22: 40 mg via INTRAVENOUS
  Filled 2019-11-21: qty 4

## 2019-11-21 NOTE — Progress Notes (Signed)
[facility]  PROGRESS NOTE  BURFORD WHEATLEY [id number] DOB: Nov 27, 1924 DOA: 11/18/2019 PCP: [name], MD Admit date - 11/18/2019   Admitting Physician Bernadette Hoit, DO  Outpatient Primary MD for the patient is [name], MD  LOS - 3 Brief Narrative   David Morgan is a 86 y.o. year old male with medical history significant for emphysematous COPD, HTN, HLD, hypothyroidism who presented on 11/18/2019 to [facility] ED with complaints of recurrent falls from home and worsening confusion per wife report. Upon EMS evaluation patient complained of abdominal pain and noted to have worsening breathing.  At [facility] ED patient was noted to be tachypneic, BP 164/76, afebrile with leukocytosis, lactic acidosis of 3.3, and AKI, creatinine 1.34, baseline 0.6-0.8, with elevated bilirubin of 4.6.  Underwent CT head and cervical spine given fall which was negative for acute findings. CT abdomen concerning for distended gallbladder with inflammatory changes worrisome for acute cholecystitis. MRCP .  General surgery was consulted in ED who recommended GI consultation.  GI recommended MRCP which showed distended gallbladder with stones and abnormal signal of CBD that could be sludge or artifact ( study was difficult).  Patient was transferred to [facility] for ERCP.  He received Zosyn in the ED  Hospital Course: Underwent ERCP on 4/3 with biliary sphincterotomy and baloon extraction for choledocholithiasis without cholecystitis. Patient has been continued on zosyn for possible cholangitis given leukocytosis and elevated procalcitonin on admission. GI recommends surgery consult regarding cholelithiasis    Subjective  Today denies abdominal pain. Mild cough. No CP or SOB  A & P    Elevated LFTs and hyperbilirubinemia and abdominal pain secondary to choledocholithiasis without cholecystitis, improving. Partial obstruction found on ERCP with biliary sphincterotomy and balloon extraction on  4/3. LFTs and bilirubin downtrending, abdominal pain resolved. Remains afebrile and leukocytosis resolved,  Zosyn since 4/3 due to concern for cholangitis.  -continue zosyn -monitor blood cultures -GI consulted, appreciated recommendations --Consulting EGS regarding cholelithiasis  Severe sepsis presumed secondary to choledocholithiasis with concern for cholangitis, resolved.  Afebrile, lactic acidosis resolved, leukocytosis downtrending. Blood cultures unremarkable to date.   Other sources of infection ruled out with unremarkable UA, ct abd and chest imaging -daily cbc -Zosyn -monitor blood cultures  AKI, improving  Likely multifactorial large related to decreased p.o. intake, infectious etiology.  Previous baseline 0.6-0.92 (from 2019).  Peak of 1.95 now 1.45. S/p lasix for pulmonary edema on CXR. UA bland --f/u urine lytes ( fe Urea) --if continues to worsen in 24 hours will obtain renal ultrasound and consult nephrology --continue lasix given persistent crackles, wheezing and congestion on CXR -Avoid nephrotoxins -Daily BMP -Monitor output  Witnessed falls at home Suspect related to worsening confusion on top of dementia related to likely infection above. Trauma imaging negative for acute findings -PT/OT  Worsening confusion, with baseline dementia without behavioral disturbance. Suspect infectious encephalopathy. CT head neg for acute findings. Alert to self and place now -corroborate baseline with family -Home Aricept  COPD, acute exacerbation. On 2 L but with great O2 saturation of 99%. Has wheezing and diffuse rhonchi much improved from day prior  -schedule duo-nebs, aIV steroids --scheduled mucinex, flutter valve -wean O2 for goal [id number]  Diastolic CHF, exacerbation. Mildly elevated BNP with pulmonary vascular congestion and mild pulmonary edema. TTE shows preserved EF with grade 1 diastolic dysfunction. Out 1.9 L in 24 hours, still some crackles and wheezing but  significantly improved from prior. Have to be careful with his kidney function --continue IV lasix  40 mg , just do qd today --daily weights, I/o  Elevated high-sensitivity troponin, mild, resolved  Suspect demand ischemia in the setting of infectious etiology and severe sepsis.  Peak troponin of 30, downtrending to 26.  A. fib on EKG with no ischemic changes -Closely monitor   Atrial fibrillation, new diagnosis? EKG on 4/2 documents A. fib.  No known history per chart review. Currently rate controlled. EKG on 4/4 shows sinus rhythm with PVCs -telemetry monitoring -monitor K( goal 4), and Mg( goal 2)  Hypertension, stable -Continue home Coreg -Holding HCTZ, losartan given AKI  Hypothyroidism -Continue home Synthroid  Type 2 diabetes, controlled. A1c 6.8% -Holding home metformin -Monitor CBGs, signs scale as needed  BPH, stable -Monitor output, continue home Flomax      Family Communication  :  None. Called wife David Morgan at [phone] on 4/3.  Left a message. Will call agi  Code Status : Full  Disposition Plan  :  Patient is from home. Anticipated d/c date: 2 to 3 days. Barriers to d/c or necessity for inpatient status:  IV Zosyn for presumed cholangitis in setting of concerning choledocholithiasis, sugery consulting, IV lasix for CHFpEF exacerbation Consults  : GI, EGS  Procedures  : ERCP, 4/3  DVT Prophylaxis  : SCDs  Lab Results  Component Value Date   PLT 145 (L) 11/21/2019    Diet :  Diet Order            Diet clear liquid Room service appropriate? No; Fluid consistency: Thin  Diet effective now               Inpatient Medications Scheduled Meds: . carvedilol  3.125 mg Oral BID WC  . donepezil  5 mg Oral Daily  . furosemide  20 mg Intravenous Once  . furosemide  40 mg Intravenous BID  . guaiFENesin  600 mg Oral BID  . indomethacin  100 mg Rectal Once  . insulin aspart  0-15 Units Subcutaneous TID WC  . insulin aspart  0-5 Units Subcutaneous  QHS  . ipratropium-albuterol  3 mL Nebulization QID  . levothyroxine  112 mcg Oral Q0600  . methylPREDNISolone (SOLU-MEDROL) injection  40 mg Intravenous Q6H  . tamsulosin  0.4 mg Oral QPM   Continuous Infusions: . piperacillin-tazobactam (ZOSYN)  IV 3.375 g (11/21/19 0826)   PRN Meds:.morphine injection  Antibiotics  :   Anti-infectives (From admission, onward)   Start     Dose/Rate Route Frequency Ordered Stop   11/19/19 0815  piperacillin-tazobactam (ZOSYN) IVPB 3.375 g     3.375 g 12.5 mL/hr over 240 Minutes Intravenous Every 8 hours 11/19/19 0806         Objective   Vitals:   11/20/19 2039 11/21/19 0009 11/21/19 0546 11/21/19 0813  BP:  123/64 137/69 137/75  Pulse:  71 66 64  Resp:  20 (!) 22 19  Temp:  98.3 F (36.8 C) (!) 97.5 F (36.4 C) (!) 97.5 F (36.4 C)  TempSrc:  Oral Oral Oral  SpO2: 96% 97% 97% 97%  Weight:   103.7 kg   Height:        SpO2: 97 % O2 Flow Rate (L/min): 2 L/min FiO2 (%): 28 %  Wt Readings from Last 3 Encounters:  11/21/19 103.7 kg  11/18/19 104 kg  12/21/17 104.3 kg     Intake/Output Summary (Last 24 hours) at 11/21/2019 0955 Last data filed at 11/21/2019 0607 Gross per 24 hour  Intake --  Output 1935 ml  Net -1935 ml    Physical Exam:     Oriented to self , place ( [place] [facility]) but not to time, no distress No new F.N deficits,  [place].AT, Normal respiratory effort on 2 L, scant wheeze and occasional crackles (improved from prior) Positive bowel sounds, Distended abdomen, soft, non tender. No guarding, no rigidity or rebound tenderness RRR,No Gallops,Rubs or new Murmurs,  No edema   I have personally reviewed the following:   Data Reviewed:  CBC Recent Labs  Lab 11/18/19 1138 11/19/19 0255 11/20/19 0340 11/21/19 0252  WBC 23.5* 26.4* 13.0* 6.9  HGB 14.6 14.0 11.5* 13.0  HCT 45.4 44.4 36.2* 40.1  PLT 175 162 120* 145*  MCV 89.9 90.8 90.3 88.5  MCH 28.9 28.6 28.7 28.7  MCHC 32.2 31.5 31.8 32.4  RDW  14.1 14.3 14.5 14.4  LYMPHSABS 0.7  --   --   --   MONOABS 0.9  --   --   --   EOSABS 0.0  --   --   --   BASOSABS 0.0  --   --   --     Chemistries  Recent Labs  Lab 11/18/19 1138 11/19/19 0255 11/20/19 0340 11/20/19 0816 11/21/19 0252  NA 134* 139 139  --  140  K 4.5 3.7 3.3*  --  3.8  CL 97* 99 104  --  104  CO2 23 24 23   --  23  GLUCOSE 386* 225* 124*  --  234*  BUN 28* 25* 45*  --  47*  CREATININE 1.34* 1.35* 1.96*  --  1.49*  CALCIUM 9.1 9.0 8.0*  --  8.1*  MG  --   --   --  2.0  --   AST 192* 109* 66*  --  46*  ALT 174* 140* 88*  --  78*  ALKPHOS 109 86 85  --  128*  BILITOT 4.6* 5.0* 4.1*  --  2.5*   ------------------------------------------------------------------------------------------------------------------ No results for input(s): CHOL, HDL, LDLCALC, TRIG, CHOLHDL, LDLDIRECT in the last 72 hours.  Lab Results  Component Value Date   HGBA1C 6.8 (H) 11/19/2019   ------------------------------------------------------------------------------------------------------------------ Recent Labs    11/19/19 1533  TSH 2.818   ------------------------------------------------------------------------------------------------------------------ No results for input(s): VITAMINB12, FOLATE, FERRITIN, TIBC, IRON, RETICCTPCT in the last 72 hours.  Coagulation profile Recent Labs  Lab 11/19/19 0255  INR 1.5*    No results for input(s): DDIMER in the last 72 hours.  Cardiac Enzymes No results for input(s): CKMB, TROPONINI, MYOGLOBIN in the last 168 hours.  Invalid input(s): CK ------------------------------------------------------------------------------------------------------------------    Component Value Date/Time   BNP 186.6 (H) 11/20/2019 0340    Micro Results Recent Results (from the past 240 hour(s))  Blood culture (routine x 2)     Status: None (Preliminary result)   Collection Time: 11/18/19  4:02 PM   Specimen: BLOOD  Result Value Ref Range  Status   Specimen Description BLOOD L HAND  Final   Special Requests   Final    BOTTLES DRAWN AEROBIC AND ANAEROBIC Blood Culture results may not be optimal due to an inadequate volume of blood received in culture bottles   Culture   Final    NO GROWTH 3 DAYS Performed at [facility], [address] [place], [place] [address]    Report Status PENDING  Incomplete  Blood culture (routine x 2)     Status: None (Preliminary result)   Collection Time: 11/18/19  4:04 PM   Specimen:  BLOOD  Result Value Ref Range Status   Specimen Description BLOOD R HAND  Final   Special Requests   Final    BOTTLES DRAWN AEROBIC AND ANAEROBIC Blood Culture results may not be optimal due to an inadequate volume of blood received in culture bottles   Culture   Final    NO GROWTH 3 DAYS Performed at [facility], [address], [place], [place] [address]    Report Status PENDING  Incomplete  Respiratory Panel by RT PCR (Flu A&B, Covid) - Nasopharyngeal Swab     Status: None   Collection Time: 11/18/19  6:18 PM   Specimen: Nasopharyngeal Swab  Result Value Ref Range Status   SARS Coronavirus 2 by RT PCR NEGATIVE NEGATIVE Final    Comment: (NOTE) SARS-CoV-2 target nucleic acids are NOT DETECTED. The SARS-CoV-2 RNA is generally detectable in upper respiratoy specimens during the acute phase of infection. The lowest concentration of SARS-CoV-2 viral copies this assay can detect is 131 copies/mL. A negative result does not preclude SARS-Cov-2 infection and should not be used as the sole basis for treatment or other patient management decisions. A negative result may occur with  improper specimen collection/handling, submission of specimen other than nasopharyngeal swab, presence of viral mutation(s) within the areas targeted by this assay, and inadequate number of viral copies (<131 copies/mL). A negative result must be combined with clinical observations, patient history, and  epidemiological information. The expected result is Negative. Fact Sheet for Patients:  PinkCheek.be Fact Sheet for Healthcare Providers:  GravelBags.it This test is not yet ap proved or cleared by the [place] FDA and  has been authorized for detection and/or diagnosis of SARS-CoV-2 by FDA under an Emergency Use Authorization (EUA). This EUA will remain  in effect (meaning this test can be used) for the duration of the COVID-19 declaration under Section 564(b)(1) of the Act, 21 U.S.C. section 360bbb-3(b)(1), unless the authorization is terminated or revoked sooner.    Influenza A by PCR NEGATIVE NEGATIVE Final   Influenza B by PCR NEGATIVE NEGATIVE Final    Comment: (NOTE) The Xpert Xpress SARS-CoV-2/FLU/RSV assay is intended as an aid in  the diagnosis of influenza from Nasopharyngeal swab specimens and  should not be used as a sole basis for treatment. Nasal washings and  aspirates are unacceptable for Xpert Xpress SARS-CoV-2/FLU/RSV  testing. Fact Sheet for Patients: PinkCheek.be Fact Sheet for Healthcare Providers: GravelBags.it This test is not yet approved or cleared by the [place] FDA and  has been authorized for detection and/or diagnosis of SARS-CoV-2 by  FDA under an Emergency Use Authorization (EUA). This EUA will remain  in effect (meaning this test can be used) for the duration of the  Covid-19 declaration under Section 564(b)(1) of the Act, 21  U.S.C. section 360bbb-3(b)(1), unless the authorization is  terminated or revoked. Performed at [facility], [address], [place], [place] [address]     Radiology Reports CT Head Wo Contrast  Result Date: 11/18/2019 CLINICAL DATA:  [place] at home yesterday. [place] again this morning. Confusion. EXAM: CT HEAD WITHOUT CONTRAST CT CERVICAL SPINE WITHOUT CONTRAST TECHNIQUE: Multidetector CT  imaging of the head and cervical spine was performed following the standard protocol without intravenous contrast. Multiplanar CT image reconstructions of the cervical spine were also generated. COMPARISON:  Head CT 12/21/2017 FINDINGS: CT HEAD FINDINGS Brain: Stable age related significant cerebral atrophy, ventriculomegaly and periventricular white matter disease. No extra-axial fluid collections are identified. No CT findings for  acute hemispheric infarction or intracranial hemorrhage. No mass lesions. The brainstem and cerebellum are normal. Vascular: Stable vascular calcifications. No hyperdense vessels or obvious aneurysm. Skull: No skull fracture or bone lesions. Sinuses/Orbits: The paranasal sinuses and mastoid air cells are clear. The globes are intact. Other: No scalp lesions or obvious laceration or foreign body. CT CERVICAL SPINE FINDINGS Alignment: Normal overall alignment. Mild degenerative anterior subluxation of C7 compared to T1. The facet joints are fused at this disc space level. Skull base and vertebrae: No acute fracture. No primary bone lesion or focal pathologic process. Soft tissues and spinal canal: No prevertebral fluid or swelling. No visible canal hematoma. Disc levels: The spinal canal is fairly generous. No significant spinal stenosis. Multilevel facet disease and mild uncinate spurring changes contributing to mild multilevel bony foraminal narrowing. Upper chest: The lung apices are grossly clear. Other: No neck mass or adenopathy. Carotid artery calcifications are noted. IMPRESSION: 1. Stable age related cerebral atrophy, ventriculomegaly and periventricular white matter disease. 2. No acute intracranial findings or skull fracture. 3. Degenerative cervical spondylosis with multilevel disc disease, facet disease and uncinate spurring changes but no acute cervical spine fracture. Electronically Signed   By: Marijo Sanes M.D.   On: 11/18/2019 13:19   CT Angio Chest PE W and/or Wo  Contrast  Result Date: 11/18/2019 CLINICAL DATA:  86 year old male with abdominal trauma EXAM: CT ANGIOGRAPHY CHEST CT ABDOMEN AND PELVIS WITH CONTRAST TECHNIQUE: Multidetector CT imaging of the chest was performed using the standard protocol during bolus administration of intravenous contrast. Multiplanar CT image reconstructions and MIPs were obtained to evaluate the vascular anatomy. Multidetector CT imaging of the abdomen and pelvis was performed using the standard protocol during bolus administration of intravenous contrast. CONTRAST:  79mL OMNIPAQUE IOHEXOL 350 MG/ML SOLN COMPARISON:  09/18/2015 FINDINGS: CTA CHEST FINDINGS Cardiovascular: Heart: No cardiomegaly. No pericardial fluid/thickening. Calcifications of left main, left anterior descending, circumflex, right coronary arteries. Aorta: Calcifications of the aortic valve. Unremarkable course caliber and contour of the thoracic aorta. Moderate atherosclerosis of the aorta including some irregular intimal plaque of the sending thoracic aorta. No ulcerated plaque or evidence of dissection. Pulmonary arteries: No central, lobar, segmental, or proximal subsegmental filling defects. Mediastinum/Nodes: Multiple small mediastinal lymph nodes throughout all nodal stations. Unremarkable appearance of the thoracic esophagus. Unremarkable thoracic inlet Lungs/Pleura: Centrilobular and paraseptal emphysema. Respiratory motion limits evaluation for small nodules. No pneumothorax or pleural effusion. No confluent airspace disease. Review of the MIP images confirms the above findings. CT ABDOMEN and PELVIS FINDINGS Hepatobiliary: Unremarkable liver. Gallbladder is distended with radiopaque stones in the neck of the gallbladder. No significant pericholecystic fluid. Mild inflammatory changes of the gallbladder wall and the adjacent fat. No definite radiopaque stones identified in the region of the common bile duct. Pancreas: Unremarkable Spleen: Unremarkable  Adrenals/Urinary Tract: - Right adrenal gland:  Unremarkable - Left adrenal gland: Unremarkable. - Right kidney: No hydronephrosis, nephrolithiasis, inflammation, or ureteral dilation. Hypodense lesion on the posterior cortex of the right kidney is too small to characterize. Hyperdense focal lesion on the lateral cortex of the right kidney was present on the comparison CT and most likely represents a complex cyst given the decreasing size though is strictly too small to characterize. - Left Kidney: No hydronephrosis, nephrolithiasis, inflammation, or ureteral dilation. No focal lesion. - Urinary Bladder: Urinary bladder relatively decompressed. Stomach/Bowel: - Stomach: Small hiatal hernia.  Otherwise unremarkable stomach. - Small bowel: Unremarkable - Appendix: Normal. - Colon: Colonic diverticula of the sigmoid colon and  the left colon. No focal inflammatory changes. No bowel obstruction. Mild stool burden. Vascular/Lymphatic: Atherosclerosis of the abdominal aorta. No periaortic fluid or inflammatory changes. Greatest diameter of the aorta is estimated 3.4 cm. Irregular plaque versus chronic dissection just above the bifurcation. Atherosclerotic changes of the bilateral iliac arteries. Bilateral iliac arteries are patent. Proximal femoral arteries are patent. Reproductive: Calcifications of the prostate. Other: Fat containing umbilical hernia. Musculoskeletal: Osteopenia. Degenerative changes of the visualized spine no acute displaced vertebral body fracture. Kyphotic deformity of the thoracic spine likely degenerative. No bony canal narrowing.  No aggressive bony lesions identified. Treatment changes of prior vertebral augmentation of L3. Surgical changes of prior left femur repair. Review of the MIP images confirms the above findings. IMPRESSION: No acute finding of the chest CT angiogram. Cholelithiasis with distended gallbladder and vague inflammatory changes of the gallbladder wall. These findings may  reflect early acute cholecystitis. Correlation with lab values and patient presentation may be useful, as well as consideration of confirmatory HIDA study. Ultrasound will likely yield no further useful imaging information. Emphysema (ICD10-J43.9). Aortic Atherosclerosis (ICD10-I70.0). Associated coronary artery disease, bilateral iliac arterial disease, irregular plaque of the abdominal aorta. Diverticular disease without evidence of acute diverticulitis. Additional ancillary findings as above. Electronically Signed   By: Corrie Mckusick D.O.   On: 11/18/2019 13:26   CT Cervical Spine Wo Contrast  Result Date: 11/18/2019 CLINICAL DATA:  [place] at home yesterday. [place] again this morning. Confusion. EXAM: CT HEAD WITHOUT CONTRAST CT CERVICAL SPINE WITHOUT CONTRAST TECHNIQUE: Multidetector CT imaging of the head and cervical spine was performed following the standard protocol without intravenous contrast. Multiplanar CT image reconstructions of the cervical spine were also generated. COMPARISON:  Head CT 12/21/2017 FINDINGS: CT HEAD FINDINGS Brain: Stable age related significant cerebral atrophy, ventriculomegaly and periventricular white matter disease. No extra-axial fluid collections are identified. No CT findings for acute hemispheric infarction or intracranial hemorrhage. No mass lesions. The brainstem and cerebellum are normal. Vascular: Stable vascular calcifications. No hyperdense vessels or obvious aneurysm. Skull: No skull fracture or bone lesions. Sinuses/Orbits: The paranasal sinuses and mastoid air cells are clear. The globes are intact. Other: No scalp lesions or obvious laceration or foreign body. CT CERVICAL SPINE FINDINGS Alignment: Normal overall alignment. Mild degenerative anterior subluxation of C7 compared to T1. The facet joints are fused at this disc space level. Skull base and vertebrae: No acute fracture. No primary bone lesion or focal pathologic process. Soft tissues and spinal canal: No  prevertebral fluid or swelling. No visible canal hematoma. Disc levels: The spinal canal is fairly generous. No significant spinal stenosis. Multilevel facet disease and mild uncinate spurring changes contributing to mild multilevel bony foraminal narrowing. Upper chest: The lung apices are grossly clear. Other: No neck mass or adenopathy. Carotid artery calcifications are noted. IMPRESSION: 1. Stable age related cerebral atrophy, ventriculomegaly and periventricular white matter disease. 2. No acute intracranial findings or skull fracture. 3. Degenerative cervical spondylosis with multilevel disc disease, facet disease and uncinate spurring changes but no acute cervical spine fracture. Electronically Signed   By: Marijo Sanes M.D.   On: 11/18/2019 13:19   CT ABDOMEN PELVIS W CONTRAST  Result Date: 11/18/2019 CLINICAL DATA:  86 year old male with abdominal trauma EXAM: CT ANGIOGRAPHY CHEST CT ABDOMEN AND PELVIS WITH CONTRAST TECHNIQUE: Multidetector CT imaging of the chest was performed using the standard protocol during bolus administration of intravenous contrast. Multiplanar CT image reconstructions and MIPs were obtained to evaluate the vascular anatomy. Multidetector CT imaging  of the abdomen and pelvis was performed using the standard protocol during bolus administration of intravenous contrast. CONTRAST:  4mL OMNIPAQUE IOHEXOL 350 MG/ML SOLN COMPARISON:  09/18/2015 FINDINGS: CTA CHEST FINDINGS Cardiovascular: Heart: No cardiomegaly. No pericardial fluid/thickening. Calcifications of left main, left anterior descending, circumflex, right coronary arteries. Aorta: Calcifications of the aortic valve. Unremarkable course caliber and contour of the thoracic aorta. Moderate atherosclerosis of the aorta including some irregular intimal plaque of the sending thoracic aorta. No ulcerated plaque or evidence of dissection. Pulmonary arteries: No central, lobar, segmental, or proximal subsegmental filling defects.  Mediastinum/Nodes: Multiple small mediastinal lymph nodes throughout all nodal stations. Unremarkable appearance of the thoracic esophagus. Unremarkable thoracic inlet Lungs/Pleura: Centrilobular and paraseptal emphysema. Respiratory motion limits evaluation for small nodules. No pneumothorax or pleural effusion. No confluent airspace disease. Review of the MIP images confirms the above findings. CT ABDOMEN and PELVIS FINDINGS Hepatobiliary: Unremarkable liver. Gallbladder is distended with radiopaque stones in the neck of the gallbladder. No significant pericholecystic fluid. Mild inflammatory changes of the gallbladder wall and the adjacent fat. No definite radiopaque stones identified in the region of the common bile duct. Pancreas: Unremarkable Spleen: Unremarkable Adrenals/Urinary Tract: - Right adrenal gland:  Unremarkable - Left adrenal gland: Unremarkable. - Right kidney: No hydronephrosis, nephrolithiasis, inflammation, or ureteral dilation. Hypodense lesion on the posterior cortex of the right kidney is too small to characterize. Hyperdense focal lesion on the lateral cortex of the right kidney was present on the comparison CT and most likely represents a complex cyst given the decreasing size though is strictly too small to characterize. - Left Kidney: No hydronephrosis, nephrolithiasis, inflammation, or ureteral dilation. No focal lesion. - Urinary Bladder: Urinary bladder relatively decompressed. Stomach/Bowel: - Stomach: Small hiatal hernia.  Otherwise unremarkable stomach. - Small bowel: Unremarkable - Appendix: Normal. - Colon: Colonic diverticula of the sigmoid colon and the left colon. No focal inflammatory changes. No bowel obstruction. Mild stool burden. Vascular/Lymphatic: Atherosclerosis of the abdominal aorta. No periaortic fluid or inflammatory changes. Greatest diameter of the aorta is estimated 3.4 cm. Irregular plaque versus chronic dissection just above the bifurcation. Atherosclerotic  changes of the bilateral iliac arteries. Bilateral iliac arteries are patent. Proximal femoral arteries are patent. Reproductive: Calcifications of the prostate. Other: Fat containing umbilical hernia. Musculoskeletal: Osteopenia. Degenerative changes of the visualized spine no acute displaced vertebral body fracture. Kyphotic deformity of the thoracic spine likely degenerative. No bony canal narrowing.  No aggressive bony lesions identified. Treatment changes of prior vertebral augmentation of L3. Surgical changes of prior left femur repair. Review of the MIP images confirms the above findings. IMPRESSION: No acute finding of the chest CT angiogram. Cholelithiasis with distended gallbladder and vague inflammatory changes of the gallbladder wall. These findings may reflect early acute cholecystitis. Correlation with lab values and patient presentation may be useful, as well as consideration of confirmatory HIDA study. Ultrasound will likely yield no further useful imaging information. Emphysema (ICD10-J43.9). Aortic Atherosclerosis (ICD10-I70.0). Associated coronary artery disease, bilateral iliac arterial disease, irregular plaque of the abdominal aorta. Diverticular disease without evidence of acute diverticulitis. Additional ancillary findings as above. Electronically Signed   By: Corrie Mckusick D.O.   On: 11/18/2019 13:26   MR 3D Recon At Scanner  Result Date: 11/18/2019 CLINICAL DATA:  Cholelithiasis with elevated bilirubin. EXAM: MRI ABDOMEN WITHOUT AND WITH CONTRAST (INCLUDING MRCP) TECHNIQUE: Multiplanar multisequence MR imaging of the abdomen was performed both before and after the administration of intravenous contrast. Heavily T2-weighted images of the biliary and pancreatic  ducts were obtained, and three-dimensional MRCP images were rendered by post processing. CONTRAST:  14mL GADAVIST GADOBUTROL 1 MMOL/ML IV SOLN COMPARISON:  CT scan 11/18/2019 FINDINGS: Motion degraded study due to patient inability  to reproducibly breath hold. Lower chest: Unremarkable. Hepatobiliary: 8 mm cyst noted inferior right liver. Gallbladder is distended with layering tiny gallstones. Common duct measures 7 mm diameter. There is abnormal signal in the distal common bile duct. This is not well evaluated due to motion artifact and could represent sludge impacted in the distal common bile duct although tiny layering/stacked gallstones cannot be excluded. Pancreas: Limited assessment due to motion degradation on postcontrast imaging. No focal mass lesion. No dilatation of the main duct. No intraparenchymal cyst. No peripancreatic edema. Spleen:  No gross abnormality. Adrenals/Urinary Tract: No adrenal nodule or mass. Bilateral renal cysts. Exophytic 10 mm complex cystic lesion in the interpolar right kidney (image 27/3 and image 330/18). This is not well evaluated due to motion degradation but comparing today's CT to a CT scan from 09/18/2015, this lesion has decreased in size in the interval consistent with benign etiology. Stomach/Bowel: Stomach is moderately distended. No small bowel or colonic dilatation within the visualized abdomen. Vascular/Lymphatic: Atherosclerotic changes noted in the abdominal aorta. No abdominal lymphadenopathy. Other:  No intraperitoneal free fluid. Musculoskeletal: No suspicious marrow enhancement within the visualized bony anatomy. IMPRESSION: 1. Motion degraded study due to patient inability to reproducibly breath hold. 2. There is abnormal signal in the 7 mm diameter distal common bile duct with loss of normal T2 hyperintensity. This is not well evaluated due to motion artifact and could represent sludge or stacked gallstones. No suspicious enhancement to suggest soft tissue mass, but follow-up likely warranted. 3. Cholelithiasis. 4. Right renal cysts. Electronically Signed   By: Misty Stanley M.D.   On: 11/18/2019 16:04   DG CHEST PORT 1 VIEW  Result Date: 11/20/2019 CLINICAL DATA:  COPD, possible  CHF. Mild pulmonary edema or might chest x-ray. Evaluate for change. EXAM: PORTABLE CHEST 1 VIEW COMPARISON:  Chest x-rays dated 11/19/2019 and 09/05/2015. FINDINGS: Continued central pulmonary vascular congestion and mild bilateral interstitial edema. Probable mild bibasilar atelectasis and/or small pleural effusions. Heart size and mediastinal contours are stable in size and configuration. No pneumothorax is seen. Osseous structures about the chest are unremarkable. IMPRESSION: 1. No interval change. Persistent central pulmonary vascular congestion and mild bilateral interstitial edema suggesting mild volume overload/CHF. 2. Probable mild bibasilar atelectasis and/or small pleural effusions. Electronically Signed   By: Franki Cabot M.D.   On: 11/20/2019 11:52   DG CHEST PORT 1 VIEW  Result Date: 11/19/2019 CLINICAL DATA:  86 year old male with shortness of breath EXAM: PORTABLE CHEST 1 VIEW COMPARISON:  Prior chest x-ray 09/05/2015 FINDINGS: Very low inspiratory volumes. Pulmonary vascular congestion is present bordering on mild edema. Nonspecific patchy bibasilar airspace opacities. Cardiac and mediastinal contours are within normal limits. No pneumothorax. No acute osseous abnormality. IMPRESSION: 1. Pulmonary vascular congestion bordering on mild edema. 2. Very low inspiratory volumes. 3. Nonspecific bibasilar patchy airspace opacities which could reflect atelectasis, dependent edema or infiltrate. Electronically Signed   By: Jacqulynn Cadet M.D.   On: 11/19/2019 16:18   DG ERCP  Result Date: 11/19/2019 CLINICAL DATA:  86 year old male with choledocholithiasis EXAM: ERCP TECHNIQUE: Multiple spot images obtained with the fluoroscopic device and submitted for interpretation post-procedure. FLUOROSCOPY TIME:  Fluoroscopy Time:  12 minutes 57 seconds COMPARISON:  MRCP 11/18/2019 FINDINGS: A total of 4 intraoperative saved images are submitted for review. The  images demonstrate a flexible endoscope in the  descending duodenum with wire cannulation of the common bile duct. Retrograde cholangiogram was then performed. The images demonstrate multiple small filling defects in the common bile duct consistent with reported history of choledocholithiasis. The cystic duct is patent. Opacification of the gallbladder demonstrates that it is filled with numerous filling defects consistent with small stones. No significant biliary ductal dilatation. On the final image, the common bile duct appears clear and a small amount of contrast is seen within the duodenum. IMPRESSION: 1. Cholelithiasis. 2. Choledocholithiasis. These images were submitted for radiologic interpretation only. Please see the procedural report for the amount of contrast and the fluoroscopy time utilized. Electronically Signed   By: Jacqulynn Cadet M.D.   On: 11/19/2019 15:43   MR ABDOMEN MRCP W WO CONTAST  Result Date: 11/18/2019 CLINICAL DATA:  Cholelithiasis with elevated bilirubin. EXAM: MRI ABDOMEN WITHOUT AND WITH CONTRAST (INCLUDING MRCP) TECHNIQUE: Multiplanar multisequence MR imaging of the abdomen was performed both before and after the administration of intravenous contrast. Heavily T2-weighted images of the biliary and pancreatic ducts were obtained, and three-dimensional MRCP images were rendered by post processing. CONTRAST:  56mL GADAVIST GADOBUTROL 1 MMOL/ML IV SOLN COMPARISON:  CT scan 11/18/2019 FINDINGS: Motion degraded study due to patient inability to reproducibly breath hold. Lower chest: Unremarkable. Hepatobiliary: 8 mm cyst noted inferior right liver. Gallbladder is distended with layering tiny gallstones. Common duct measures 7 mm diameter. There is abnormal signal in the distal common bile duct. This is not well evaluated due to motion artifact and could represent sludge impacted in the distal common bile duct although tiny layering/stacked gallstones cannot be excluded. Pancreas: Limited assessment due to motion degradation on  postcontrast imaging. No focal mass lesion. No dilatation of the main duct. No intraparenchymal cyst. No peripancreatic edema. Spleen:  No gross abnormality. Adrenals/Urinary Tract: No adrenal nodule or mass. Bilateral renal cysts. Exophytic 10 mm complex cystic lesion in the interpolar right kidney (image 27/3 and image 330/18). This is not well evaluated due to motion degradation but comparing today's CT to a CT scan from 09/18/2015, this lesion has decreased in size in the interval consistent with benign etiology. Stomach/Bowel: Stomach is moderately distended. No small bowel or colonic dilatation within the visualized abdomen. Vascular/Lymphatic: Atherosclerotic changes noted in the abdominal aorta. No abdominal lymphadenopathy. Other:  No intraperitoneal free fluid. Musculoskeletal: No suspicious marrow enhancement within the visualized bony anatomy. IMPRESSION: 1. Motion degraded study due to patient inability to reproducibly breath hold. 2. There is abnormal signal in the 7 mm diameter distal common bile duct with loss of normal T2 hyperintensity. This is not well evaluated due to motion artifact and could represent sludge or stacked gallstones. No suspicious enhancement to suggest soft tissue mass, but follow-up likely warranted. 3. Cholelithiasis. 4. Right renal cysts. Electronically Signed   By: Misty Stanley M.D.   On: 11/18/2019 16:04   ECHOCARDIOGRAM COMPLETE  Result Date: 11/20/2019    ECHOCARDIOGRAM REPORT   Patient Name:   David Morgan Date of Exam: 11/20/2019 Medical Rec #:  [id number]          Height:       69.0 in Accession #:    [id number]         Weight:       231.0 lb Date of Birth:  [birth year]         BSA:          2.197 m Patient Age:  89 years           BP:           109/57 mmHg Patient Gender: M                  HR:           70 bpm. Exam Location:  Inpatient Procedure: 2D Echo, Cardiac Doppler and Color Doppler Indications:    Pulmonary Vascular Congestion [id number]  History:         Patient has no prior history of Echocardiogram examinations.                 TIA, PAD and COPD, Signs/Symptoms:Dyspnea; Risk                 Factors:Hypertension and Dyslipidemia. Cancer.  Sonographer:    Tiffany Dance Referring Phys: [id number] [place]  1. Left ventricular ejection fraction, by estimation, is 55 to 60%. The left ventricle has normal function. The left ventricle has no regional wall motion abnormalities. There is mild left ventricular hypertrophy. Left ventricular diastolic parameters are consistent with Grade I diastolic dysfunction (impaired relaxation). Elevated left ventricular end-diastolic pressure.  2. Right ventricular systolic function is mildly reduced. The right ventricular size is normal. There is moderately elevated pulmonary artery systolic pressure. The estimated right ventricular systolic pressure is 123456 mmHg.  3. Left atrial size was mildly dilated.  4. The mitral valve is abnormal. Mild mitral valve regurgitation.  5. The aortic valve is tricuspid. Aortic valve regurgitation is not visualized. Moderate aortic valve stenosis. Aortic valve area, by VTI measures 1.44 cm. Aortic valve mean gradient measures 17.3 mmHg. Aortic valve Vmax measures 2.62 m/s.  6. The inferior vena cava is dilated in size with <50% respiratory variability, suggesting right atrial pressure of 15 mmHg. FINDINGS  Left Ventricle: Left ventricular ejection fraction, by estimation, is 55 to 60%. The left ventricle has normal function. The left ventricle has no regional wall motion abnormalities. The left ventricular internal cavity size was normal in size. There is  mild left ventricular hypertrophy. Left ventricular diastolic parameters are consistent with Grade I diastolic dysfunction (impaired relaxation). Elevated left ventricular end-diastolic pressure. Right Ventricle: The right ventricular size is normal. No increase in right ventricular wall thickness. Right ventricular systolic function  is mildly reduced. There is moderately elevated pulmonary artery systolic pressure. The tricuspid regurgitant velocity is 2.93 m/s, and with an assumed right atrial pressure of 15 mmHg, the estimated right ventricular systolic pressure is 123456 mmHg. Left Atrium: Left atrial size was mildly dilated. Right Atrium: Right atrial size was normal in size. Pericardium: There is no evidence of pericardial effusion. Mitral Valve: The mitral valve is abnormal. There is mild thickening of the mitral valve leaflet(s). There is moderate calcification of the mitral valve leaflet(s). Mild mitral valve regurgitation. Tricuspid Valve: The tricuspid valve is grossly normal. Tricuspid valve regurgitation is mild. Aortic Valve: The aortic valve is tricuspid. . There is moderate thickening and moderate calcification of the aortic valve. Aortic valve regurgitation is not visualized. Moderate aortic stenosis is present. Mild aortic valve annular calcification. There is moderate thickening of the aortic valve. There is moderate calcification of the aortic valve. Aortic valve mean gradient measures 17.3 mmHg. Aortic valve peak gradient measures 27.5 mmHg. Aortic valve area, by VTI measures 1.44 cm. Pulmonic Valve: The pulmonic valve was grossly normal. Pulmonic valve regurgitation is trivial. Aorta: The aortic root and ascending aorta are structurally normal, with no evidence  of dilitation. Venous: The inferior vena cava is dilated in size with less than 50% respiratory variability, suggesting right atrial pressure of 15 mmHg. IAS/Shunts: No atrial level shunt detected by color flow Doppler.  LEFT VENTRICLE PLAX 2D LVIDd:         4.30 cm  Diastology LVIDs:         3.92 cm  LV e' lateral:   7.72 cm/s LV PW:         1.48 cm  LV E/e' lateral: 13.9 LV IVS:        1.05 cm  LV e' medial:    5.33 cm/s LVOT diam:     1.90 cm  LV E/e' medial:  20.1 LV SV:         97 LV SV Index:   44 LVOT Area:     2.84 cm  RIGHT VENTRICLE            IVC RV Basal  diam:  4.08 cm    IVC diam: 2.13 cm RV Mid diam:    2.78 cm RV S prime:     9.25 cm/s TAPSE (M-mode): 1.7 cm LEFT ATRIUM              Index       RIGHT ATRIUM           Index LA diam:        5.40 cm  2.46 cm/m  RA Area:     20.30 cm LA Vol (A2C):   115.0 ml 52.35 ml/m RA Volume:   47.20 ml  21.49 ml/m LA Vol (A4C):   55.2 ml  25.13 ml/m LA Biplane Vol: 81.1 ml  36.92 ml/m  AORTIC VALVE AV Area (Vmax):    1.46 cm AV Area (Vmean):   1.46 cm AV Area (VTI):     1.44 cm AV Vmax:           262.33 cm/s AV Vmean:          194.333 cm/s AV VTI:            0.674 m AV Peak Grad:      27.5 mmHg AV Mean Grad:      17.3 mmHg LVOT Vmax:         134.67 cm/s LVOT Vmean:        100.300 cm/s LVOT VTI:          0.343 m LVOT/AV VTI ratio: 0.51  AORTA Ao Root diam: 3.90 cm Ao Asc diam:  3.60 cm MITRAL VALVE                TRICUSPID VALVE MV Area (PHT): 3.77 cm     TR Peak grad:   34.3 mmHg MV Decel Time: 201 msec     TR Vmax:        293.00 cm/s MV E velocity: 107.00 cm/s MV A velocity: 101.00 cm/s  SHUNTS MV E/A ratio:  1.06         Systemic VTI:  0.34 m                             Systemic Diam: 1.90 cm [name] Bishop MD Electronically signed by [name] Bishop MD Signature Date/Time: 11/20/2019/5:05:08 PM    Final      Time Spent in minutes  30     Desiree Hane M.D on 11/21/2019 at 9:55 AM  To page go to [email or web address] - password [facility]

## 2019-11-21 NOTE — Progress Notes (Signed)
Inpatient Diabetes Program Recommendations  AACE/ADA: New Consensus Statement on Inpatient Glycemic Control (2015)  Target Ranges:  Prepandial:   less than 140 mg/dL      Peak postprandial:   less than 180 mg/dL (1-2 hours)      Critically ill patients:  140 - 180 mg/dL   Results for [name], [name] (MRN [id number]) as of 11/21/2019 10:34  Ref. Range 11/20/2019 06:26 11/20/2019 11:10 11/20/2019 16:32 11/20/2019 21:42 11/21/2019 06:11 11/21/2019 08:19  Glucose-Capillary Latest Ref Range: 70 - 99 mg/dL 114 (H) 130 (H) 155 (H) 221 (H) 201 (H) 225 (H)  Results for [name], [name] (MRN [id number]) as of 11/21/2019 10:34  Ref. Range 11/19/2019 02:55  Hemoglobin A1C Latest Ref Range: 4.8 - 5.6 % 6.8 (H)   Review of Glycemic Control  Diabetes history: DM2 Outpatient Diabetes medications: Metformin 500 mg daily Current orders for Inpatient glycemic control: Novolog 0-15 units TID with meals, Novolog 0-5 units QHS; Solumedrol 40 mg Q6H  Inpatient Diabetes Program Recommendations:   Insulin - Basal: If steroids are continued, please consider ordering Lantus 5 units Q24H. If Lantus ordered as recommended, please keep in mind that Lantus will need to be adjusted as steroids are tapered.  Thanks, Barnie Alderman, RN, MSN, CDE Diabetes Coordinator Inpatient Diabetes Program [phone] (Team Pager from 8am to 5pm)

## 2019-11-21 NOTE — Progress Notes (Signed)
      Progress Note   Subjective  Patient tolerating clears. Pleasant. Denies any pains today.    Objective   Vital signs in last 24 hours: Temp:  [97.5 F (36.4 C)-98.6 F (37 C)] 97.5 F (36.4 C) (04/05 0813) Pulse Rate:  [64-77] 64 (04/05 0813) Resp:  [17-22] 19 (04/05 0813) BP: (109-137)/(57-75) 137/75 (04/05 0813) SpO2:  [96 %-98 %] 97 % (04/05 0813) FiO2 (%):  [28 %] 28 % (04/04 1512) Weight:  [103.7 kg] 103.7 kg (04/05 0546) Last BM Date: (prior to admission) General:    white male in NAD Abdomen:  Soft, nontender  Neurologic:  Alert and oriented,  grossly normal neurologically. Psych:  Cooperative. Normal mood and affect.  Intake/Output from previous day: 04/04 0701 - 04/05 0700 In: -  Out: 1935 [Urine:1935] Intake/Output this shift: No intake/output data recorded.  Lab Results: Recent Labs    11/19/19 0255 11/20/19 0340 11/21/19 0252  WBC 26.4* 13.0* 6.9  HGB 14.0 11.5* 13.0  HCT 44.4 36.2* 40.1  PLT 162 120* 145*   BMET Recent Labs    11/19/19 0255 11/20/19 0340 11/21/19 0252  NA 139 139 140  K 3.7 3.3* 3.8  CL 99 104 104  CO2 24 23 23   GLUCOSE 225* 124* 234*  BUN 25* 45* 47*  CREATININE 1.35* 1.96* 1.49*  CALCIUM 9.0 8.0* 8.1*   LFT Recent Labs    11/21/19 0252  PROT 7.0  ALBUMIN 2.5*  AST 46*  ALT 78*  ALKPHOS 128*  BILITOT 2.5*   PT/INR Recent Labs    11/19/19 0255  LABPROT 17.6*  INR 1.5*    Studies/Results:      Assessment / Plan:   86 y/o male admitted with choledocholithiasis and possible cholangitis given elevated WBC. Post ERCP with sphincterotomy and balloon sweep with removal of stone. Labs improving, WBC down, LFTs down, lipase normal. No evidence of post-ERCP pancreatitis.  Agree with surgical consult to discuss possibility of cholecystectomy. Otherwise he looks good, WBC coming down, would continue course of antibiotics.  We will sign off for now, call with questions moving forward.  [place] Cellar,  MD [facility]

## 2019-11-21 NOTE — Consult Note (Signed)
David Morgan Aug 30, 1924  [id number].    Requesting MD: Dr. Oretha Milch Chief Complaint/Reason for Consult: cholelithiasis, choledocholithiasis  HPI:  This is a 86 yo white male with a history of COPD, TIAs, HTN, HLD, h/o bladder cancer, PVD, DOE, DM, who presented to [facility] secondary to a mechanical fall with increasing confusion per the chart and spouse.  He apparently complained of abdominal pain at the time of the ED visit.  He had a work up completed which revealed choledocholithiasis.  He was transferred to [facility] where GI was available for ERCP.  He underwent an ERCP this week with improvement in his LFTs.  The patient is confused and does even know why he is in the hospital. All information obtained from the chart.  We have been asked to see the patient for consideration of lap chole.  ROS: ROS: Unable to obtain due to mental acuity  Family History  Problem Relation Age of Onset  . Hypertension Father   . Alzheimer's disease Mother     Past Medical History:  Diagnosis Date  . Arthritis   . Cancer ([place])   . Claudication ([place])   . Diverticulitis   . Emphysema/COPD ([place])   . History of bladder cancer   . History of carotid artery stenosis   . History of TIA (transient ischemic attack)    2012--  NO RESIDUAL (PER SCAN HAD A PREVIOUS TIA BEFORE 2012)  . Hyperlipidemia   . Hypertension   . Hypothyroidism   . Lesion of bladder   . Mild asthma    NO INHALER  . Nocturia   . Peripheral vascular disease ([place])   . Short of breath on exertion   . Urgency of urination   . Wears glasses     Past Surgical History:  Procedure Laterality Date  . CAROTID ENDARTERECTOMY Right 2005  . CARPAL TUNNEL RELEASE Bilateral 2002  &  2007  . CATARACT EXTRACTION W/ INTRAOCULAR LENS  IMPLANT, BILATERAL    . CYSTOSCOPY W/ RETROGRADES Bilateral 12/26/2013   Procedure: BILATERAL RETROGRADE PYELOGRAM;  Surgeon: Claybon Jabs, MD;  [facility];  Service: Urology;   Laterality: Bilateral;  . CYSTOSCOPY WITH BIOPSY N/A 12/26/2013   Procedure: CYSTOSCOPY WITH BLADDER BIOPSY;  Surgeon: Claybon Jabs, MD;  [facility];  Service: Urology;  Laterality: N/A;  . ERCP N/A 11/19/2019   Procedure: ENDOSCOPIC RETROGRADE CHOLANGIOPANCREATOGRAPHY (ERCP);  Surgeon: Gatha Mayer, MD;  Location: [facility] ENDOSCOPY;  Service: Endoscopy;  Laterality: N/A;  . INGUINAL HERNIA REPAIR  YRS AGO  . LAPAROSCOPIC LYSIS OF ADHESIONS  06/28/2015   Procedure: LAPAROSCOPIC LYSIS OF ADHESIONS;  Surgeon: Clayburn Pert, MD;  Location: [facility] ORS;  Service: General;;  . LAPAROSCOPY  06/28/2015   Procedure: LAPAROSCOPY DIAGNOSTIC;  Surgeon: Clayburn Pert, MD;  Location: [facility] ORS;  Service: General;;  . LAPAROTOMY N/A 09/03/2015   Procedure: umbilical hernia repair with mesh;  Surgeon: Hubbard Robinson, MD;  Location: [facility] ORS;  Service: General;  Laterality: N/A;  . ORIF HIP FRACTURE Left 2008   RETAINED HARDWARE  . REMOVAL OF STONES  11/19/2019   Procedure: REMOVAL OF STONES;  Surgeon: Gatha Mayer, MD;  Location: [facility] ENDOSCOPY;  Service: Endoscopy;;  . RIGHT SHOULDER  SURGERY  2005  . SPHINCTEROTOMY  11/19/2019   Procedure: SPHINCTEROTOMY;  Surgeon: Gatha Mayer, MD;  Location: [facility] ENDOSCOPY;  Service: Endoscopy;;  . TOTAL KNEE ARTHROPLASTY Right 2004  . TRANSURETHRAL RESECTION OF  BLADDER TUMOR  1990  . UMBILICAL HERNIA REPAIR   2009  &  2011    Social History:  reports that he quit smoking about 25 years ago. His smoking use included cigarettes. He has a 40.00 pack-year smoking history. He has never used smokeless tobacco. He reports that he does not drink alcohol or use drugs.  Allergies:  Allergies  Allergen Reactions  . Adhesive [Tape] Rash  . Augmentin [Amoxicillin-Pot Clavulanate] Itching, Rash and Other (See Comments)    Has patient had a PCN reaction causing immediate rash, facial/tongue/throat swelling, SOB or lightheadedness with hypotension:  No Has patient had a PCN reaction causing severe rash involving mucus membranes or skin necrosis: No Has patient had a PCN reaction that required hospitalization No Has patient had a PCN reaction occurring within the last 10 years: No If all of the above answers are "NO", then may proceed with Cephalosporin use.     Medications Prior to Admission  Medication Sig Dispense Refill  . carvedilol (COREG) 3.125 MG tablet Take 3.125 mg by mouth 2 (two) times daily with a meal.    . donepezil (ARICEPT) 5 MG tablet Take 5 mg by mouth daily.    . hydrochlorothiazide (HYDRODIURIL) 12.5 MG tablet Take 12.5 mg by mouth daily.    [name] ipratropium (ATROVENT) 0.03 % nasal spray Place 2 sprays into both nostrils 2 (two) times daily.    [name] ketoconazole (NIZORAL) 2 % cream Apply 1 application topically daily.     [name] levothyroxine (SYNTHROID, LEVOTHROID) 112 MCG tablet Take 112 mcg by mouth daily before breakfast.    . losartan (COZAAR) 50 MG tablet Take 50 mg by mouth daily.     . metFORMIN (GLUCOPHAGE) 500 MG tablet Take 500 mg by mouth daily.    . Multiple Vitamin (MULTI-VITAMINS) TABS Take 1 tablet by mouth daily.    . mupirocin ointment (BACTROBAN) 2 % Apply 1 application topically 3 (three) times daily.     . tamsulosin (FLOMAX) 0.4 MG CAPS capsule Take 0.4 mg by mouth every evening.     [name] acetaminophen (TYLENOL) 500 MG tablet Take 2 tablets (1,000 mg total) by mouth every 6 (six) hours as needed. (Patient not taking: Reported on 11/19/2019) 30 tablet 0  . Hydrocodone-Acetaminophen (VICODIN) 5-300 MG TABS Take 1 tablet by mouth every 4 (four) hours as needed. (Patient not taking: Reported on 12/21/2017) 30 each 0  . ondansetron (ZOFRAN) 4 MG tablet Take 1 tablet (4 mg total) by mouth every 6 (six) hours as needed for nausea. (Patient not taking: Reported on 12/21/2017) 20 tablet 0     Physical Exam: Blood pressure 125/88, pulse 73, temperature 97.7 F (36.5 C), temperature source Oral, resp. rate (!) 21, height  5\' 9"  (1.753 m), weight 103.7 kg, SpO2 97 %. General: confused obese, white male who is laying in bed in NAD, but upset because he spilt his liquids all over his bed HEENT: head is normocephalic, atraumatic.  Sclera are noninjected.  PERRL.  Ears and nose without any masses or lesions.  Mouth is pink and moist Heart: regular, rate, and rhythm.  Normal s1,s2. No obvious murmurs, gallops, or rubs noted.  Palpable radial and pedal pulses bilaterally Lungs: diffuse coarse sounds with bilateral rhonchi noted. O2 in place Abd: soft, mild RUQ tenderness, ND/obese, +BS, no masses, hernias, or organomegaly MS: all 4 extremities are symmetrical with no cyanosis, clubbing, or edema. Skin: warm and dry with no masses, lesions, or rashes Neuro: Cranial nerves 2-12 grossly  intact, sensation is normal throughout Psych: Alert but not oriented to situation or time.  Appropriate affect   Results for orders placed or performed during the hospital encounter of 11/18/19 (from the past 48 hour(s))  Glucose, capillary     Status: Abnormal   Collection Time: 11/19/19 12:35 PM  Result Value Ref Range   Glucose-Capillary 154 (H) 70 - 99 mg/dL    Comment: Glucose reference range applies only to samples taken after fasting for at least 8 hours.  Glucose, capillary     Status: Abnormal   Collection Time: 11/19/19 12:58 PM  Result Value Ref Range   Glucose-Capillary 153 (H) 70 - 99 mg/dL    Comment: Glucose reference range applies only to samples taken after fasting for at least 8 hours.  TSH     Status: None   Collection Time: 11/19/19  3:33 PM  Result Value Ref Range   TSH 2.818 0.350 - 4.500 uIU/mL    Comment: Performed by a 3rd Generation assay with a functional sensitivity of <=0.01 uIU/mL. Performed at [facility], [place] [address], [place], [place] [address]   Glucose, capillary     Status: Abnormal   Collection Time: 11/19/19  4:53 PM  Result Value Ref Range   Glucose-Capillary 132 (H) 70 - 99 mg/dL     Comment: Glucose reference range applies only to samples taken after fasting for at least 8 hours.  Glucose, capillary     Status: Abnormal   Collection Time: 11/19/19  9:58 PM  Result Value Ref Range   Glucose-Capillary 120 (H) 70 - 99 mg/dL    Comment: Glucose reference range applies only to samples taken after fasting for at least 8 hours.  Procalcitonin     Status: None   Collection Time: 11/20/19  3:40 AM  Result Value Ref Range   Procalcitonin 12.04 ng/mL    Comment:        Interpretation: PCT >= 10 ng/mL: Important systemic inflammatory response, almost exclusively due to severe bacterial sepsis or septic shock. (NOTE)       Sepsis PCT Algorithm           Lower Respiratory Tract                                      Infection PCT Algorithm    ----------------------------     ----------------------------         PCT < 0.25 ng/mL                PCT < 0.10 ng/mL         Strongly encourage             Strongly discourage   discontinuation of antibiotics    initiation of antibiotics    ----------------------------     -----------------------------       PCT 0.25 - 0.50 ng/mL            PCT 0.10 - 0.25 ng/mL               OR       >80% decrease in PCT            Discourage initiation of  antibiotics      Encourage discontinuation           of antibiotics    ----------------------------     -----------------------------         PCT >= 0.50 ng/mL              PCT 0.26 - 0.50 ng/mL                AND       <80% decrease in PCT             Encourage initiation of                                             antibiotics       Encourage continuation           of antibiotics    ----------------------------     -----------------------------        PCT >= 0.50 ng/mL                  PCT > 0.50 ng/mL               AND         increase in PCT                  Strongly encourage                                      initiation of antibiotics     Strongly encourage escalation           of antibiotics                                     -----------------------------                                           PCT <= 0.25 ng/mL                                                 OR                                        > 80% decrease in PCT                                     Discontinue / Do not initiate                                             antibiotics Performed at [facility], [place] [address] [place], [place] [address]   CBC Once     Status: Abnormal   Collection Time: 11/20/19  3:40 AM  Result Value Ref Range   WBC 13.0 (H) 4.0 - 10.5 K/uL   RBC 4.01 (L) 4.22 - 5.81 MIL/uL   Hemoglobin 11.5 (L) 13.0 - 17.0 g/dL   HCT 36.2 (L) 39.0 - 52.0 %   MCV 90.3 80.0 - 100.0 fL   MCH 28.7 26.0 - 34.0 pg   MCHC 31.8 30.0 - 36.0 g/dL   RDW 14.5 11.5 - 15.5 %   Platelets 120 (L) 150 - 400 K/uL   nRBC 0.0 0.0 - 0.2 %    Comment: Performed at [facility] [address] [place], [place] [address]  Comprehensive metabolic panel Once     Status: Abnormal   Collection Time: 11/20/19  3:40 AM  Result Value Ref Range   Sodium 139 135 - 145 mmol/L   Potassium 3.3 (L) 3.5 - 5.1 mmol/L   Chloride 104 98 - 111 mmol/L   CO2 23 22 - 32 mmol/L   Glucose, Bld 124 (H) 70 - 99 mg/dL    Comment: Glucose reference range applies only to samples taken after fasting for at least 8 hours.   BUN 45 (H) 8 - 23 mg/dL   Creatinine, Ser 1.96 (H) 0.61 - 1.24 mg/dL   Calcium 8.0 (L) 8.9 - 10.3 mg/dL   Total Protein 6.1 (L) 6.5 - 8.1 g/dL   Albumin 2.4 (L) 3.5 - 5.0 g/dL   AST 66 (H) 15 - 41 U/L   ALT 88 (H) 0 - 44 U/L   Alkaline Phosphatase 85 38 - 126 U/L   Total Bilirubin 4.1 (H) 0.3 - 1.2 mg/dL   GFR calc non Af Amer 29 (L) >60 mL/min   GFR calc Af Amer 33 (L) >60 mL/min   Anion gap 12 5 - 15    Comment: Performed at [facility] [address] [place], [place] [address]  Lipase, blood     Status: Abnormal   Collection Time:  11/20/19  3:40 AM  Result Value Ref Range   Lipase 392 (H) 11 - 51 U/L    Comment: Performed at [facility], [place] [address], [place], [place] [address]  Brain natriuretic peptide     Status: Abnormal   Collection Time: 11/20/19  3:40 AM  Result Value Ref Range   B Natriuretic Peptide 186.6 (H) 0.0 - 100.0 pg/mL    Comment: Performed at [place] [place] [address] [place], [place] [address]  Glucose, capillary     Status: Abnormal   Collection Time: 11/20/19  6:26 AM  Result Value Ref Range   Glucose-Capillary 114 (H) 70 - 99 mg/dL    Comment: Glucose reference range applies only to samples taken after fasting for at least 8 hours.  Magnesium     Status: None   Collection Time: 11/20/19  8:16 AM  Result Value Ref Range   Magnesium 2.0 1.7 - 2.4 mg/dL    Comment: Performed at [facility] [address], [place], [place] [address]  Glucose, capillary     Status: Abnormal   Collection Time: 11/20/19 11:10 AM  Result Value Ref Range   Glucose-Capillary 130 (H) 70 - 99 mg/dL    Comment: Glucose reference range applies only to samples taken after fasting for at least 8 hours.  Urea nitrogen, urine     Status: None   Collection Time: 11/20/19  3:02 PM  Result Value Ref Range   Urea Nitrogen, Ur 772 Not  Estab. mg/dL    Comment: (NOTE) Performed At: [facility] [place], [place] [id number] Rush Farmer MD [id number]   Creatinine, urine, random     Status: None   Collection Time: 11/20/19  3:02 PM  Result Value Ref Range   Creatinine, Urine 171.63 mg/dL    Comment: Performed at [facility], [place] [address], [place], [place] [address]  Glucose, capillary     Status: Abnormal   Collection Time: 11/20/19  4:32 PM  Result Value Ref Range   Glucose-Capillary 155 (H) 70 - 99 mg/dL    Comment: Glucose reference range applies only to samples taken after fasting for at least 8 hours.  Glucose, capillary     Status: Abnormal   Collection  Time: 11/20/19  9:42 PM  Result Value Ref Range   Glucose-Capillary 221 (H) 70 - 99 mg/dL    Comment: Glucose reference range applies only to samples taken after fasting for at least 8 hours.  CBC     Status: Abnormal   Collection Time: 11/21/19  2:52 AM  Result Value Ref Range   WBC 6.9 4.0 - 10.5 K/uL   RBC 4.53 4.22 - 5.81 MIL/uL   Hemoglobin 13.0 13.0 - 17.0 g/dL   HCT 40.1 39.0 - 52.0 %   MCV 88.5 80.0 - 100.0 fL   MCH 28.7 26.0 - 34.0 pg   MCHC 32.4 30.0 - 36.0 g/dL   RDW 14.4 11.5 - 15.5 %   Platelets 145 (L) 150 - 400 K/uL   nRBC 0.0 0.0 - 0.2 %    Comment: Performed at [facility], [place] [address], [place], [place] [address]  Comprehensive metabolic panel     Status: Abnormal   Collection Time: 11/21/19  2:52 AM  Result Value Ref Range   Sodium 140 135 - 145 mmol/L   Potassium 3.8 3.5 - 5.1 mmol/L   Chloride 104 98 - 111 mmol/L   CO2 23 22 - 32 mmol/L   Glucose, Bld 234 (H) 70 - 99 mg/dL    Comment: Glucose reference range applies only to samples taken after fasting for at least 8 hours.   BUN 47 (H) 8 - 23 mg/dL   Creatinine, Ser 1.49 (H) 0.61 - 1.24 mg/dL   Calcium 8.1 (L) 8.9 - 10.3 mg/dL   Total Protein 7.0 6.5 - 8.1 g/dL   Albumin 2.5 (L) 3.5 - 5.0 g/dL   AST 46 (H) 15 - 41 U/L   ALT 78 (H) 0 - 44 U/L   Alkaline Phosphatase 128 (H) 38 - 126 U/L   Total Bilirubin 2.5 (H) 0.3 - 1.2 mg/dL   GFR calc non Af Amer 40 (L) >60 mL/min   GFR calc Af Amer 47 (L) >60 mL/min   Anion gap 13 5 - 15    Comment: Performed at [facility] [address], [place], [place] [address]  Lipase, blood     Status: None   Collection Time: 11/21/19  2:52 AM  Result Value Ref Range   Lipase 37 11 - 51 U/L    Comment: Performed at [facility], [place] [address], [place], [place] [address]  Glucose, capillary     Status: Abnormal   Collection Time: 11/21/19  6:11 AM  Result Value Ref Range   Glucose-Capillary 201 (H) 70 - 99 mg/dL    Comment: Glucose reference range  applies only to samples taken after fasting for at least 8  hours.  Glucose, capillary     Status: Abnormal   Collection Time: 11/21/19  8:19 AM  Result Value Ref Range   Glucose-Capillary 225 (H) 70 - 99 mg/dL    Comment: Glucose reference range applies only to samples taken after fasting for at least 8 hours.  Glucose, capillary     Status: Abnormal   Collection Time: 11/21/19 11:33 AM  Result Value Ref Range   Glucose-Capillary 259 (H) 70 - 99 mg/dL    Comment: Glucose reference range applies only to samples taken after fasting for at least 8 hours.   DG CHEST PORT 1 VIEW  Result Date: 11/20/2019 CLINICAL DATA:  COPD, possible CHF. Mild pulmonary edema or might chest x-ray. Evaluate for change. EXAM: PORTABLE CHEST 1 VIEW COMPARISON:  Chest x-rays dated 11/19/2019 and 09/05/2015. FINDINGS: Continued central pulmonary vascular congestion and mild bilateral interstitial edema. Probable mild bibasilar atelectasis and/or small pleural effusions. Heart size and mediastinal contours are stable in size and configuration. No pneumothorax is seen. Osseous structures about the chest are unremarkable. IMPRESSION: 1. No interval change. Persistent central pulmonary vascular congestion and mild bilateral interstitial edema suggesting mild volume overload/CHF. 2. Probable mild bibasilar atelectasis and/or small pleural effusions. Electronically Signed   By: Franki Cabot M.D.   On: 11/20/2019 11:52   DG CHEST PORT 1 VIEW  Result Date: 11/19/2019 CLINICAL DATA:  86 year old male with shortness of breath EXAM: PORTABLE CHEST 1 VIEW COMPARISON:  Prior chest x-ray 09/05/2015 FINDINGS: Very low inspiratory volumes. Pulmonary vascular congestion is present bordering on mild edema. Nonspecific patchy bibasilar airspace opacities. Cardiac and mediastinal contours are within normal limits. No pneumothorax. No acute osseous abnormality. IMPRESSION: 1. Pulmonary vascular congestion bordering on mild edema. 2. Very low  inspiratory volumes. 3. Nonspecific bibasilar patchy airspace opacities which could reflect atelectasis, dependent edema or infiltrate. Electronically Signed   By: Jacqulynn Cadet M.D.   On: 11/19/2019 16:18   DG ERCP  Result Date: 11/19/2019 CLINICAL DATA:  86 year old male with choledocholithiasis EXAM: ERCP TECHNIQUE: Multiple spot images obtained with the fluoroscopic device and submitted for interpretation post-procedure. FLUOROSCOPY TIME:  Fluoroscopy Time:  12 minutes 57 seconds COMPARISON:  MRCP 11/18/2019 FINDINGS: A total of 4 intraoperative saved images are submitted for review. The images demonstrate a flexible endoscope in the descending duodenum with wire cannulation of the common bile duct. Retrograde cholangiogram was then performed. The images demonstrate multiple small filling defects in the common bile duct consistent with reported history of choledocholithiasis. The cystic duct is patent. Opacification of the gallbladder demonstrates that it is filled with numerous filling defects consistent with small stones. No significant biliary ductal dilatation. On the final image, the common bile duct appears clear and a small amount of contrast is seen within the duodenum. IMPRESSION: 1. Cholelithiasis. 2. Choledocholithiasis. These images were submitted for radiologic interpretation only. Please see the procedural report for the amount of contrast and the fluoroscopy time utilized. Electronically Signed   By: Jacqulynn Cadet M.D.   On: 11/19/2019 15:43   ECHOCARDIOGRAM COMPLETE  Result Date: 11/20/2019    ECHOCARDIOGRAM REPORT   Patient Name:   CHAYNE BLEAU Date of Exam: 11/20/2019 Medical Rec #:  [place]:[id number]          Height:       69.0 in Accession #:    [id number]         Weight:       231.0 lb Date of Birth:  [birth year]  BSA:          2.197 m Patient Age:    75 years           BP:           109/57 mmHg Patient Gender: M                  HR:           70 bpm. Exam Location:  Inpatient  Procedure: 2D Echo, Cardiac Doppler and Color Doppler Indications:    Pulmonary Vascular Congestion [id number]  History:        Patient has no prior history of Echocardiogram examinations.                 TIA, PAD and COPD, Signs/Symptoms:Dyspnea; Risk                 Factors:Hypertension and Dyslipidemia. Cancer.  Sonographer:    Tiffany Dance Referring Phys: [id number] [place]  1. Left ventricular ejection fraction, by estimation, is 55 to 60%. The left ventricle has normal function. The left ventricle has no regional wall motion abnormalities. There is mild left ventricular hypertrophy. Left ventricular diastolic parameters are consistent with Grade I diastolic dysfunction (impaired relaxation). Elevated left ventricular end-diastolic pressure.  2. Right ventricular systolic function is mildly reduced. The right ventricular size is normal. There is moderately elevated pulmonary artery systolic pressure. The estimated right ventricular systolic pressure is 123456 mmHg.  3. Left atrial size was mildly dilated.  4. The mitral valve is abnormal. Mild mitral valve regurgitation.  5. The aortic valve is tricuspid. Aortic valve regurgitation is not visualized. Moderate aortic valve stenosis. Aortic valve area, by VTI measures 1.44 cm. Aortic valve mean gradient measures 17.3 mmHg. Aortic valve Vmax measures 2.62 m/s.  6. The inferior vena cava is dilated in size with <50% respiratory variability, suggesting right atrial pressure of 15 mmHg. FINDINGS  Left Ventricle: Left ventricular ejection fraction, by estimation, is 55 to 60%. The left ventricle has normal function. The left ventricle has no regional wall motion abnormalities. The left ventricular internal cavity size was normal in size. There is  mild left ventricular hypertrophy. Left ventricular diastolic parameters are consistent with Grade I diastolic dysfunction (impaired relaxation). Elevated left ventricular end-diastolic pressure. Right  Ventricle: The right ventricular size is normal. No increase in right ventricular wall thickness. Right ventricular systolic function is mildly reduced. There is moderately elevated pulmonary artery systolic pressure. The tricuspid regurgitant velocity is 2.93 m/s, and with an assumed right atrial pressure of 15 mmHg, the estimated right ventricular systolic pressure is 123456 mmHg. Left Atrium: Left atrial size was mildly dilated. Right Atrium: Right atrial size was normal in size. Pericardium: There is no evidence of pericardial effusion. Mitral Valve: The mitral valve is abnormal. There is mild thickening of the mitral valve leaflet(s). There is moderate calcification of the mitral valve leaflet(s). Mild mitral valve regurgitation. Tricuspid Valve: The tricuspid valve is grossly normal. Tricuspid valve regurgitation is mild. Aortic Valve: The aortic valve is tricuspid. . There is moderate thickening and moderate calcification of the aortic valve. Aortic valve regurgitation is not visualized. Moderate aortic stenosis is present. Mild aortic valve annular calcification. There is moderate thickening of the aortic valve. There is moderate calcification of the aortic valve. Aortic valve mean gradient measures 17.3 mmHg. Aortic valve peak gradient measures 27.5 mmHg. Aortic valve area, by VTI measures 1.44 cm. Pulmonic Valve: The pulmonic valve was grossly normal. Pulmonic  valve regurgitation is trivial. Aorta: The aortic root and ascending aorta are structurally normal, with no evidence of dilitation. Venous: The inferior vena cava is dilated in size with less than 50% respiratory variability, suggesting right atrial pressure of 15 mmHg. IAS/Shunts: No atrial level shunt detected by color flow Doppler.  LEFT VENTRICLE PLAX 2D LVIDd:         4.30 cm  Diastology LVIDs:         3.92 cm  LV e' lateral:   7.72 cm/s LV PW:         1.48 cm  LV E/e' lateral: 13.9 LV IVS:        1.05 cm  LV e' medial:    5.33 cm/s LVOT diam:      1.90 cm  LV E/e' medial:  20.1 LV SV:         97 LV SV Index:   44 LVOT Area:     2.84 cm  RIGHT VENTRICLE            IVC RV Basal diam:  4.08 cm    IVC diam: 2.13 cm RV Mid diam:    2.78 cm RV S prime:     9.25 cm/s TAPSE (M-mode): 1.7 cm LEFT ATRIUM              Index       RIGHT ATRIUM           Index LA diam:        5.40 cm  2.46 cm/m  RA Area:     20.30 cm LA Vol (A2C):   115.0 ml 52.35 ml/m RA Volume:   47.20 ml  21.49 ml/m LA Vol (A4C):   55.2 ml  25.13 ml/m LA Biplane Vol: 81.1 ml  36.92 ml/m  AORTIC VALVE AV Area (Vmax):    1.46 cm AV Area (Vmean):   1.46 cm AV Area (VTI):     1.44 cm AV Vmax:           262.33 cm/s AV Vmean:          194.333 cm/s AV VTI:            0.674 m AV Peak Grad:      27.5 mmHg AV Mean Grad:      17.3 mmHg LVOT Vmax:         134.67 cm/s LVOT Vmean:        100.300 cm/s LVOT VTI:          0.343 m LVOT/AV VTI ratio: 0.51  AORTA Ao Root diam: 3.90 cm Ao Asc diam:  3.60 cm MITRAL VALVE                TRICUSPID VALVE MV Area (PHT): 3.77 cm     TR Peak grad:   34.3 mmHg MV Decel Time: 201 msec     TR Vmax:        293.00 cm/s MV E velocity: 107.00 cm/s MV A velocity: 101.00 cm/s  SHUNTS MV E/A ratio:  1.06         Systemic VTI:  0.34 m                             Systemic Diam: 1.90 cm [name] Bishop MD Electronically signed by [name] Bishop MD Signature Date/Time: 11/20/2019/5:05:08 PM    Final       Assessment/Plan TIAs HTN HLD H/o bladder cancer DM COPD PVD  DOE Dementia per wife  Cholelithiasis with choledocholithiasis The patient does not have evidence of cholecystitis.  He has cholelithiasis with choledocholithiasis for which he underwent an ERCP with sphincterotomy this weekend.  His LFTs are trending down.  We have seen the patient and discussed his situation with his wife on the phone.  We do NOT recommend proceeding with a lap chole given his age, comorbidities, and that he has now had a sphincterotomy which, given his age, will likely prevent him from  further complications during his life time.  I discussed with the wife how a sphincterotomy works and why this should hopefully help him prevent having obstructing choledocholithiasis again.  I did not guarantee that it would not happen again, but explained that the risks vs benefits for someone in his condition would tend towards conservative management and avoidance of surgical intervention.  His confusion is significantly worse his wife states and he is already having to be placed in a SNF after just a short hospital stay.  We discussed surgery would definitely result in SNF given his disabilities and that his cognition would likely even worsen more after general anesthesia.  She understood and was appreciative of the explanation.  She is good with no surgery.  Thank you for this consult.  We will sign off.    Henreitta Cea, [facility] 11/21/2019, 12:29 PM Please see Amion for pager number during day hours 7:00am-4:30pm or 7:00am -11:30am on weekends

## 2019-11-22 DIAGNOSIS — R262 Difficulty in walking, not elsewhere classified: Secondary | ICD-10-CM | POA: Diagnosis not present

## 2019-11-22 DIAGNOSIS — L03116 Cellulitis of left lower limb: Secondary | ICD-10-CM | POA: Diagnosis not present

## 2019-11-22 DIAGNOSIS — L03115 Cellulitis of right lower limb: Secondary | ICD-10-CM | POA: Diagnosis not present

## 2019-11-22 DIAGNOSIS — E1165 Type 2 diabetes mellitus with hyperglycemia: Secondary | ICD-10-CM | POA: Diagnosis not present

## 2019-11-22 DIAGNOSIS — I10 Essential (primary) hypertension: Secondary | ICD-10-CM | POA: Diagnosis not present

## 2019-11-22 DIAGNOSIS — L308 Other specified dermatitis: Secondary | ICD-10-CM | POA: Diagnosis not present

## 2019-11-22 DIAGNOSIS — I739 Peripheral vascular disease, unspecified: Secondary | ICD-10-CM | POA: Diagnosis not present

## 2019-11-22 LAB — GLUCOSE, CAPILLARY
Glucose-Capillary: 216 mg/dL — ABNORMAL HIGH (ref 70–99)
Glucose-Capillary: 228 mg/dL — ABNORMAL HIGH (ref 70–99)
Glucose-Capillary: 209 mg/dL — ABNORMAL HIGH (ref 70–99)
Glucose-Capillary: 231 mg/dL — ABNORMAL HIGH (ref 70–99)

## 2019-11-22 LAB — COMPREHENSIVE METABOLIC PANEL
ALT: 68 U/L — ABNORMAL HIGH (ref 0–44)
AST: 37 U/L (ref 15–41)
Albumin: 2.5 g/dL — ABNORMAL LOW (ref 3.5–5.0)
Alkaline Phosphatase: 149 U/L — ABNORMAL HIGH (ref 38–126)
Anion gap: 11 (ref 5–15)
BUN: 40 mg/dL — ABNORMAL HIGH (ref 8–23)
CO2: 25 mmol/L (ref 22–32)
Calcium: 8.3 mg/dL — ABNORMAL LOW (ref 8.9–10.3)
Chloride: 105 mmol/L (ref 98–111)
Creatinine, Ser: 1.13 mg/dL (ref 0.61–1.24)
GFR calc Af Amer: >60 mL/min (ref >60)
GFR calc non Af Amer: 56 mL/min — ABNORMAL LOW (ref >60)
Glucose, Bld: 234 mg/dL — ABNORMAL HIGH (ref 70–99)
Potassium: 3.2 mmol/L — ABNORMAL LOW (ref 3.5–5.1)
Sodium: 141 mmol/L (ref 135–145)
Total Bilirubin: 1.9 mg/dL — ABNORMAL HIGH (ref 0.3–1.2)
Total Protein: 7.1 g/dL (ref 6.5–8.1)

## 2019-11-22 LAB — MAGNESIUM: Magnesium: 2.3 mg/dL (ref 1.7–2.4)

## 2019-11-22 LAB — CBC
HCT: 40.2 % (ref 39.0–52.0)
Hemoglobin: 12.9 g/dL — ABNORMAL LOW (ref 13.0–17.0)
MCH: 28.4 pg (ref 26.0–34.0)
MCHC: 32.1 g/dL (ref 30.0–36.0)
MCV: 88.4 fL (ref 80.0–100.0)
Platelets: 150 10*3/uL (ref 150–400)
RBC: 4.55 MIL/uL (ref 4.22–5.81)
RDW: 14.5 % (ref 11.5–15.5)
WBC: 7.9 10*3/uL (ref 4.0–10.5)
nRBC: 0 % (ref 0.0–0.2)

## 2019-11-22 MED ORDER — PREDNISONE 20 MG PO TABS
40.0000 mg | ORAL_TABLET | Freq: Every day | ORAL | Status: DC
  Administered 2019-11-24: 40 mg via ORAL
  Filled 2019-11-22: qty 2

## 2019-11-22 MED ORDER — METHYLPREDNISOLONE SODIUM SUCC 40 MG IJ SOLR
40.0000 mg | Freq: Two times a day (BID) | INTRAMUSCULAR | Status: AC
  Administered 2019-11-23 (×2): 40 mg via INTRAVENOUS
  Filled 2019-11-22 (×2): qty 1

## 2019-11-22 MED ORDER — CIPROFLOXACIN HCL 500 MG PO TABS
500.0000 mg | ORAL_TABLET | Freq: Two times a day (BID) | ORAL | Status: DC
  Administered 2019-11-22 – 2019-11-26 (×9): 500 mg via ORAL
  Filled 2019-11-22 (×9): qty 1

## 2019-11-22 MED ORDER — IPRATROPIUM-ALBUTEROL 0.5-2.5 (3) MG/3ML IN SOLN
3.0000 mL | Freq: Three times a day (TID) | RESPIRATORY_TRACT | Status: DC
  Administered 2019-11-23: 3 mL via RESPIRATORY_TRACT
  Filled 2019-11-22 (×2): qty 3

## 2019-11-22 MED ORDER — POTASSIUM CHLORIDE 20 MEQ PO PACK
40.0000 meq | PACK | ORAL | Status: AC
  Administered 2019-11-22 (×2): 40 meq via ORAL
  Filled 2019-11-22 (×2): qty 2

## 2019-11-22 MED ORDER — FUROSEMIDE 40 MG PO TABS
40.0000 mg | ORAL_TABLET | Freq: Every day | ORAL | Status: DC
  Administered 2019-11-23 – 2019-11-24 (×2): 40 mg via ORAL
  Filled 2019-11-22 (×2): qty 1

## 2019-11-22 MED ORDER — METRONIDAZOLE 500 MG PO TABS
500.0000 mg | ORAL_TABLET | Freq: Three times a day (TID) | ORAL | Status: DC
  Administered 2019-11-22 – 2019-11-26 (×13): 500 mg via ORAL
  Filled 2019-11-22 (×13): qty 1

## 2019-11-22 NOTE — Progress Notes (Signed)
Inpatient Diabetes Program Recommendations  AACE/ADA: New Consensus Statement on Inpatient Glycemic Control   Target Ranges:  Prepandial:   less than 140 mg/dL      Peak postprandial:   less than 180 mg/dL (1-2 hours)      Critically ill patients:  140 - 180 mg/dL   Results for HYDER, BACAK (MRN [id number]) as of 11/22/2019 11:57  Ref. Range 11/21/2019 08:19 11/21/2019 11:33 11/21/2019 16:29 11/21/2019 21:42 11/22/2019 06:24 11/22/2019 11:01  Glucose-Capillary Latest Ref Range: 70 - 99 mg/dL 225 (H) 259 (H) 207 (H) 234 (H) 216 (H) 228 (H)   Review of Glycemic Control  Diabetes history: DM2 Outpatient Diabetes medications: Metformin 500 mg daily Current orders for Inpatient glycemic control: Novolog 0-15 units TID with meals, Novolog 0-5 units QHS; Solumedrol 40 mg Q6H  Inpatient Diabetes Program Recommendations:   Insulin - Basal: If steroids are continued, please consider ordering Lantus 7 units Q24H. If Lantus ordered as recommended, please keep in mind that Lantus will need to be adjusted as steroids are tapered.  Thanks, Barnie Alderman, RN, MSN, CDE Diabetes Coordinator Inpatient Diabetes Program [phone] (Team Pager from 8am to 5pm)

## 2019-11-22 NOTE — Progress Notes (Signed)
[facility]  PROGRESS NOTE  David Morgan [id number] DOB: [birth year] DOA: 11/18/2019 PCP: [name], MD Admit date - 11/18/2019   Admitting Physician Bernadette Hoit, DO  Outpatient Primary MD for the patient is [name], MD  LOS - 4 Brief Narrative   David Morgan is a 86 y.o. year old male with medical history significant for emphysematous COPD, HTN, HLD, hypothyroidism who presented on 11/18/2019 to [facility] ED with complaints of recurrent falls from home and worsening confusion per wife report. Upon EMS evaluation patient complained of abdominal pain and noted to have worsening breathing.  At [facility] ED patient was noted to be tachypneic, BP 164/76, afebrile with leukocytosis, lactic acidosis of 3.3, and AKI, creatinine 1.34, baseline 0.6-0.8, with elevated bilirubin of 4.6.  Underwent CT head and cervical spine given fall which was negative for acute findings. CT abdomen concerning for distended gallbladder with inflammatory changes worrisome for acute cholecystitis. MRCP .  General surgery was consulted in ED who recommended GI consultation.  GI recommended MRCP which showed distended gallbladder with stones and abnormal signal of CBD that could be sludge or artifact ( study was difficult).  Patient was transferred to [facility] for ERCP.  He received Zosyn in the ED  Hospital Course: Underwent ERCP on 4/3 with biliary sphincterotomy and baloon extraction for choledocholithiasis without cholecystitis. Patient has been continued on zosyn for possible cholangitis given leukocytosis and elevated procalcitonin on admission. Surgery recommended conservative care and no cholecystectomy for cholelithiasis given no cholecystitis on imaging and s/p biliary sphincterotomy decreases the likelihood and since his age and overall makes the risk of surgery outweigh the benefit   Subjective  Today is confused. States only some abdominal pain. Asking me to cut off the hose on his  penis.   A & P    Elevated LFTs and hyperbilirubinemia and abdominal pain secondary to choledocholithiasis without cholecystitis, improving. Partial obstruction found on ERCP with biliary sphincterotomy and balloon extraction on 4/3. LFTs and bilirubin downtrending, abdominal pain improving. Remains afebrile and leukocytosis resolved,  Zosyn since 4/3 due to concern for cholangitis.  -transition to cipro and flagyl -monitor blood cultures -GI consulted,signed off--agree with antibiotics -EG consulted, signed off--no plan for surgery for cholelithiasis given risk outweigh benefit  Severe sepsis presumed secondary to choledocholithiasis with concern for cholangitis, resolved.  Afebrile, lactic acidosis resolved, leukocytosis downtrending. Blood cultures unremarkable to date.   Other sources of infection ruled out with unremarkable UA, ct abd and chest imaging -daily cbc -Zosyn to change to cipro and flagyl -monitor blood cultures  AKI, improving  Likely multifactorial large related to decreased p.o. intake, infectious etiology.  Previous baseline 0.6-0.92 (from 2019).  Peak of 1.95 now 1.11. S/p lasix for pulmonary edema on CXR. UA bland --continue lasix given persistent crackles, wheezing and congestion on CXR -Avoid nephrotoxins -Daily BMP -Monitor output  Witnessed falls at home Suspect related to worsening confusion on top of dementia related to likely infection above. Trauma imaging negative for acute findings -PT/OT rec SNF   Worsening confusion, with baseline dementia without behavioral disturbance. Suspect infectious encephalopathy, likely worsened with IV solumedrol. CT head neg for acute findings. Alert to self only this am.  -Delirium precautions --wean IV steroids -Home Aricept  COPD, acute exacerbation. On 1 L but with great O2 saturation of 99%. Has wheezing and rhonchi that have improved significantly from day prior  -schedule duo-nebs, wean IV steroid frequency to BID  then oral prednisone 40 mg --scheduled mucinex, flutter valve -  wean O2 for goal [id number]  Diastolic CHF, exacerbation. Mildly elevated BNP with pulmonary vascular congestion and mild pulmonary edema. TTE shows preserved EF with grade 1 diastolic dysfunction. Net negative 4.3 L this admission, still some crackles and wheezing but significantly improved from prior. Have to be careful with his kidney function --continue IV lasix 40 mg today and transition to oral lasix 40 mg on 4/7 --daily weights, I/o  Elevated high-sensitivity troponin, mild, resolved  Suspect demand ischemia in the setting of infectious etiology and severe sepsis.  Peak troponin of 30, downtrending to 26.  A. fib on EKG with no ischemic changes -Closely monitor   Sinus rhythm with PVCs EKG on 4/2 documents A. fib.  No known history per chart review. Currently rate controlled. EKG on 4/4 shows sinus rhythm with PVCs -telemetry monitoring -monitor K( goal 4)--3.2 today repleting, and Mg( goal 2)  Hypertension, stable -Continue home Coreg -Holding HCTZ, losartan given AKI -- IV hydralazine PRN  Hypothyroidism -Continue home Synthroid  Type 2 diabetes, controlled. A1c 6.8% -Holding home metformin -Monitor CBGs, signs scale as needed  BPH, stable -Monitor output, continue home Flomax      Family Communication  :  Called wife David Morgan at [phone] on 4/6  Code Status : Full  Disposition Plan  :  Patient is from home. Anticipated d/c date: 24-48 hours. Barriers to d/c or necessity for inpatient status: transition from Zosyn to ciprofloxacin and flagyl on 4/6. Weaning IV steroids and eventually transition to prednisone. Transition from IV lasix to oral lasix on 4/7. Then d/c to SNF Consults  : GI, EGS  Procedures  : ERCP, 4/3  DVT Prophylaxis  : SCDs  Lab Results  Component Value Date   PLT 150 11/22/2019    Diet :  Diet Order            Diet clear liquid Room service appropriate? No; Fluid  consistency: Thin  Diet effective now               Inpatient Medications Scheduled Meds:  carvedilol  3.125 mg Oral BID WC   ciprofloxacin  500 mg Oral BID   donepezil  5 mg Oral Daily   furosemide  40 mg Intravenous Daily   guaiFENesin  600 mg Oral BID   insulin aspart  0-15 Units Subcutaneous TID WC   insulin aspart  0-5 Units Subcutaneous QHS   ipratropium-albuterol  3 mL Nebulization TID   levothyroxine  112 mcg Oral Q0600   methylPREDNISolone (SOLU-MEDROL) injection  40 mg Intravenous Q6H   metroNIDAZOLE  500 mg Oral Q8H   tamsulosin  0.4 mg Oral QPM   Continuous Infusions:  PRN Meds:.morphine injection  Antibiotics  :   Anti-infectives (From admission, onward)   Start     Dose/Rate Route Frequency Ordered Stop   11/22/19 1000  ciprofloxacin (CIPRO) tablet 500 mg     500 mg Oral 2 times daily 11/22/19 0841     11/22/19 0900  metroNIDAZOLE (FLAGYL) tablet 500 mg     500 mg Oral Every 8 hours 11/22/19 0811     11/19/19 0815  piperacillin-tazobactam (ZOSYN) IVPB 3.375 g  Status:  Discontinued     3.375 g 12.5 mL/hr over 240 Minutes Intravenous Every 8 hours 11/19/19 0806 11/22/19 0811       Objective   Vitals:   11/22/19 1152 11/22/19 1216 11/22/19 1358 11/22/19 1717  BP: (!) 153/77   (!) 149/82  Pulse: 88 (!) 58 60 71  Resp: (!) 21 (!) 26  20  Temp:    97.9 F (36.6 C)  TempSrc:    Oral  SpO2:  98% 98%   Weight:      Height:        SpO2: 98 % O2 Flow Rate (L/min): 1.5 L/min FiO2 (%): 28 %  Wt Readings from Last 3 Encounters:  11/22/19 107.1 kg  11/18/19 104 kg  12/21/17 104.3 kg     Intake/Output Summary (Last 24 hours) at 11/22/2019 1829 Last data filed at 11/22/2019 1100 Gross per 24 hour  Intake --  Output 2900 ml  Net -2900 ml    Physical Exam:     Oriented to self only, pleasantly confused, no distress No new F.N deficits,  [place].AT, Normal respiratory effort on 1 L, scant wheeze and occasional crackles (improved from  prior) Positive bowel sounds, Distended abdomen, soft, non tender. No guarding, no rigidity or rebound tenderness RRR,No Gallops,Rubs or new Murmurs,  No edema   I have personally reviewed the following:   Data Reviewed:  CBC Recent Labs  Lab 11/18/19 1138 11/19/19 0255 11/20/19 0340 11/21/19 0252 11/22/19 0255  WBC 23.5* 26.4* 13.0* 6.9 7.9  HGB 14.6 14.0 11.5* 13.0 12.9*  HCT 45.4 44.4 36.2* 40.1 40.2  PLT 175 162 120* 145* 150  MCV 89.9 90.8 90.3 88.5 88.4  MCH 28.9 28.6 28.7 28.7 28.4  MCHC 32.2 31.5 31.8 32.4 32.1  RDW 14.1 14.3 14.5 14.4 14.5  LYMPHSABS 0.7  --   --   --   --   MONOABS 0.9  --   --   --   --   EOSABS 0.0  --   --   --   --   BASOSABS 0.0  --   --   --   --     Chemistries  Recent Labs  Lab 11/18/19 1138 11/19/19 0255 11/20/19 0340 11/20/19 0816 11/21/19 0252 11/22/19 0255  NA 134* 139 139  --  140 141  K 4.5 3.7 3.3*  --  3.8 3.2*  CL 97* 99 104  --  104 105  CO2 23 24 23   --  23 25  GLUCOSE 386* 225* 124*  --  234* 234*  BUN 28* 25* 45*  --  47* 40*  CREATININE 1.34* 1.35* 1.96*  --  1.49* 1.13  CALCIUM 9.1 9.0 8.0*  --  8.1* 8.3*  MG  --   --   --  2.0  --  2.3  AST 192* 109* 66*  --  46* 37  ALT 174* 140* 88*  --  78* 68*  ALKPHOS 109 86 85  --  128* 149*  BILITOT 4.6* 5.0* 4.1*  --  2.5* 1.9*   ------------------------------------------------------------------------------------------------------------------ No results for input(s): CHOL, HDL, LDLCALC, TRIG, CHOLHDL, LDLDIRECT in the last 72 hours.  Lab Results  Component Value Date   HGBA1C 6.8 (H) 11/19/2019   ------------------------------------------------------------------------------------------------------------------ No results for input(s): TSH, T4TOTAL, T3FREE, THYROIDAB in the last 72 hours.  Invalid input(s): FREET3 ------------------------------------------------------------------------------------------------------------------ No results for input(s):  VITAMINB12, FOLATE, FERRITIN, TIBC, IRON, RETICCTPCT in the last 72 hours.  Coagulation profile Recent Labs  Lab 11/19/19 0255  INR 1.5*    No results for input(s): DDIMER in the last 72 hours.  Cardiac Enzymes No results for input(s): CKMB, TROPONINI, MYOGLOBIN in the last 168 hours.  Invalid input(s): CK ------------------------------------------------------------------------------------------------------------------    Component Value Date/Time   BNP 186.6 (H) 11/20/2019 0340    Micro  Results Recent Results (from the past 240 hour(s))  Blood culture (routine x 2)     Status: None (Preliminary result)   Collection Time: 11/18/19  4:02 PM   Specimen: BLOOD  Result Value Ref Range Status   Specimen Description BLOOD L HAND  Final   Special Requests   Final    BOTTLES DRAWN AEROBIC AND ANAEROBIC Blood Culture results may not be optimal due to an inadequate volume of blood received in culture bottles   Culture   Final    NO GROWTH 4 DAYS Performed at [facility], [address], [place], [place] [address]    Report Status PENDING  Incomplete  Blood culture (routine x 2)     Status: None (Preliminary result)   Collection Time: 11/18/19  4:04 PM   Specimen: BLOOD  Result Value Ref Range Status   Specimen Description BLOOD R HAND  Final   Special Requests   Final    BOTTLES DRAWN AEROBIC AND ANAEROBIC Blood Culture results may not be optimal due to an inadequate volume of blood received in culture bottles   Culture   Final    NO GROWTH 4 DAYS Performed at [facility], [address], [place], [place] [address]    Report Status PENDING  Incomplete  Respiratory Panel by RT PCR (Flu A&B, Covid) - Nasopharyngeal Swab     Status: None   Collection Time: 11/18/19  6:18 PM   Specimen: Nasopharyngeal Swab  Result Value Ref Range Status   SARS Coronavirus 2 by RT PCR NEGATIVE NEGATIVE Final    Comment: (NOTE) SARS-CoV-2 target nucleic acids are NOT  DETECTED. The SARS-CoV-2 RNA is generally detectable in upper respiratoy specimens during the acute phase of infection. The lowest concentration of SARS-CoV-2 viral copies this assay can detect is 131 copies/mL. A negative result does not preclude SARS-Cov-2 infection and should not be used as the sole basis for treatment or other patient management decisions. A negative result may occur with  improper specimen collection/handling, submission of specimen other than nasopharyngeal swab, presence of viral mutation(s) within the areas targeted by this assay, and inadequate number of viral copies (<131 copies/mL). A negative result must be combined with clinical observations, patient history, and epidemiological information. The expected result is Negative. Fact Sheet for Patients:  PinkCheek.be Fact Sheet for Healthcare Providers:  GravelBags.it This test is not yet ap proved or cleared by the [place] FDA and  has been authorized for detection and/or diagnosis of SARS-CoV-2 by FDA under an Emergency Use Authorization (EUA). This EUA will remain  in effect (meaning this test can be used) for the duration of the COVID-19 declaration under Section 564(b)(1) of the Act, 21 U.S.C. section 360bbb-3(b)(1), unless the authorization is terminated or revoked sooner.    Influenza A by PCR NEGATIVE NEGATIVE Final   Influenza B by PCR NEGATIVE NEGATIVE Final    Comment: (NOTE) The Xpert Xpress SARS-CoV-2/FLU/RSV assay is intended as an aid in  the diagnosis of influenza from Nasopharyngeal swab specimens and  should not be used as a sole basis for treatment. Nasal washings and  aspirates are unacceptable for Xpert Xpress SARS-CoV-2/FLU/RSV  testing. Fact Sheet for Patients: PinkCheek.be Fact Sheet for Healthcare Providers: GravelBags.it This test is not yet approved or cleared  by the [place] FDA and  has been authorized for detection and/or diagnosis of SARS-CoV-2 by  FDA under an Emergency Use Authorization (EUA). This EUA will remain  in effect (meaning this test  can be used) for the duration of the  Covid-19 declaration under Section 564(b)(1) of the Act, 21  U.S.C. section 360bbb-3(b)(1), unless the authorization is  terminated or revoked. Performed at [facility], [address] [place], [place] [address]     Radiology Reports CT Head Wo Contrast  Result Date: 11/18/2019 CLINICAL DATA:  [place] at home yesterday. [place] again this morning. Confusion. EXAM: CT HEAD WITHOUT CONTRAST CT CERVICAL SPINE WITHOUT CONTRAST TECHNIQUE: Multidetector CT imaging of the head and cervical spine was performed following the standard protocol without intravenous contrast. Multiplanar CT image reconstructions of the cervical spine were also generated. COMPARISON:  Head CT 12/21/2017 FINDINGS: CT HEAD FINDINGS Brain: Stable age related significant cerebral atrophy, ventriculomegaly and periventricular white matter disease. No extra-axial fluid collections are identified. No CT findings for acute hemispheric infarction or intracranial hemorrhage. No mass lesions. The brainstem and cerebellum are normal. Vascular: Stable vascular calcifications. No hyperdense vessels or obvious aneurysm. Skull: No skull fracture or bone lesions. Sinuses/Orbits: The paranasal sinuses and mastoid air cells are clear. The globes are intact. Other: No scalp lesions or obvious laceration or foreign body. CT CERVICAL SPINE FINDINGS Alignment: Normal overall alignment. Mild degenerative anterior subluxation of C7 compared to T1. The facet joints are fused at this disc space level. Skull base and vertebrae: No acute fracture. No primary bone lesion or focal pathologic process. Soft tissues and spinal canal: No prevertebral fluid or swelling. No visible canal hematoma. Disc levels: The spinal canal is  fairly generous. No significant spinal stenosis. Multilevel facet disease and mild uncinate spurring changes contributing to mild multilevel bony foraminal narrowing. Upper chest: The lung apices are grossly clear. Other: No neck mass or adenopathy. Carotid artery calcifications are noted. IMPRESSION: 1. Stable age related cerebral atrophy, ventriculomegaly and periventricular white matter disease. 2. No acute intracranial findings or skull fracture. 3. Degenerative cervical spondylosis with multilevel disc disease, facet disease and uncinate spurring changes but no acute cervical spine fracture. Electronically Signed   By: Marijo Sanes M.D.   On: 11/18/2019 13:19   CT Angio Chest PE W and/or Wo Contrast  Result Date: 11/18/2019 CLINICAL DATA:  86 year old male with abdominal trauma EXAM: CT ANGIOGRAPHY CHEST CT ABDOMEN AND PELVIS WITH CONTRAST TECHNIQUE: Multidetector CT imaging of the chest was performed using the standard protocol during bolus administration of intravenous contrast. Multiplanar CT image reconstructions and MIPs were obtained to evaluate the vascular anatomy. Multidetector CT imaging of the abdomen and pelvis was performed using the standard protocol during bolus administration of intravenous contrast. CONTRAST:  43mL OMNIPAQUE IOHEXOL 350 MG/ML SOLN COMPARISON:  09/18/2015 FINDINGS: CTA CHEST FINDINGS Cardiovascular: Heart: No cardiomegaly. No pericardial fluid/thickening. Calcifications of left main, left anterior descending, circumflex, right coronary arteries. Aorta: Calcifications of the aortic valve. Unremarkable course caliber and contour of the thoracic aorta. Moderate atherosclerosis of the aorta including some irregular intimal plaque of the sending thoracic aorta. No ulcerated plaque or evidence of dissection. Pulmonary arteries: No central, lobar, segmental, or proximal subsegmental filling defects. Mediastinum/Nodes: Multiple small mediastinal lymph nodes throughout all nodal  stations. Unremarkable appearance of the thoracic esophagus. Unremarkable thoracic inlet Lungs/Pleura: Centrilobular and paraseptal emphysema. Respiratory motion limits evaluation for small nodules. No pneumothorax or pleural effusion. No confluent airspace disease. Review of the MIP images confirms the above findings. CT ABDOMEN and PELVIS FINDINGS Hepatobiliary: Unremarkable liver. Gallbladder is distended with radiopaque stones in the neck of the gallbladder. No significant pericholecystic fluid. Mild inflammatory changes of the gallbladder wall  and the adjacent fat. No definite radiopaque stones identified in the region of the common bile duct. Pancreas: Unremarkable Spleen: Unremarkable Adrenals/Urinary Tract: - Right adrenal gland:  Unremarkable - Left adrenal gland: Unremarkable. - Right kidney: No hydronephrosis, nephrolithiasis, inflammation, or ureteral dilation. Hypodense lesion on the posterior cortex of the right kidney is too small to characterize. Hyperdense focal lesion on the lateral cortex of the right kidney was present on the comparison CT and most likely represents a complex cyst given the decreasing size though is strictly too small to characterize. - Left Kidney: No hydronephrosis, nephrolithiasis, inflammation, or ureteral dilation. No focal lesion. - Urinary Bladder: Urinary bladder relatively decompressed. Stomach/Bowel: - Stomach: Small hiatal hernia.  Otherwise unremarkable stomach. - Small bowel: Unremarkable - Appendix: Normal. - Colon: Colonic diverticula of the sigmoid colon and the left colon. No focal inflammatory changes. No bowel obstruction. Mild stool burden. Vascular/Lymphatic: Atherosclerosis of the abdominal aorta. No periaortic fluid or inflammatory changes. Greatest diameter of the aorta is estimated 3.4 cm. Irregular plaque versus chronic dissection just above the bifurcation. Atherosclerotic changes of the bilateral iliac arteries. Bilateral iliac arteries are patent.  Proximal femoral arteries are patent. Reproductive: Calcifications of the prostate. Other: Fat containing umbilical hernia. Musculoskeletal: Osteopenia. Degenerative changes of the visualized spine no acute displaced vertebral body fracture. Kyphotic deformity of the thoracic spine likely degenerative. No bony canal narrowing.  No aggressive bony lesions identified. Treatment changes of prior vertebral augmentation of L3. Surgical changes of prior left femur repair. Review of the MIP images confirms the above findings. IMPRESSION: No acute finding of the chest CT angiogram. Cholelithiasis with distended gallbladder and vague inflammatory changes of the gallbladder wall. These findings may reflect early acute cholecystitis. Correlation with lab values and patient presentation may be useful, as well as consideration of confirmatory HIDA study. Ultrasound will likely yield no further useful imaging information. Emphysema (ICD10-J43.9). Aortic Atherosclerosis (ICD10-I70.0). Associated coronary artery disease, bilateral iliac arterial disease, irregular plaque of the abdominal aorta. Diverticular disease without evidence of acute diverticulitis. Additional ancillary findings as above. Electronically Signed   By: Corrie Mckusick D.O.   On: 11/18/2019 13:26   CT Cervical Spine Wo Contrast  Result Date: 11/18/2019 CLINICAL DATA:  [place] at home yesterday. [place] again this morning. Confusion. EXAM: CT HEAD WITHOUT CONTRAST CT CERVICAL SPINE WITHOUT CONTRAST TECHNIQUE: Multidetector CT imaging of the head and cervical spine was performed following the standard protocol without intravenous contrast. Multiplanar CT image reconstructions of the cervical spine were also generated. COMPARISON:  Head CT 12/21/2017 FINDINGS: CT HEAD FINDINGS Brain: Stable age related significant cerebral atrophy, ventriculomegaly and periventricular white matter disease. No extra-axial fluid collections are identified. No CT findings for acute  hemispheric infarction or intracranial hemorrhage. No mass lesions. The brainstem and cerebellum are normal. Vascular: Stable vascular calcifications. No hyperdense vessels or obvious aneurysm. Skull: No skull fracture or bone lesions. Sinuses/Orbits: The paranasal sinuses and mastoid air cells are clear. The globes are intact. Other: No scalp lesions or obvious laceration or foreign body. CT CERVICAL SPINE FINDINGS Alignment: Normal overall alignment. Mild degenerative anterior subluxation of C7 compared to T1. The facet joints are fused at this disc space level. Skull base and vertebrae: No acute fracture. No primary bone lesion or focal pathologic process. Soft tissues and spinal canal: No prevertebral fluid or swelling. No visible canal hematoma. Disc levels: The spinal canal is fairly generous. No significant spinal stenosis. Multilevel facet disease and mild uncinate spurring changes contributing to mild multilevel bony  foraminal narrowing. Upper chest: The lung apices are grossly clear. Other: No neck mass or adenopathy. Carotid artery calcifications are noted. IMPRESSION: 1. Stable age related cerebral atrophy, ventriculomegaly and periventricular white matter disease. 2. No acute intracranial findings or skull fracture. 3. Degenerative cervical spondylosis with multilevel disc disease, facet disease and uncinate spurring changes but no acute cervical spine fracture. Electronically Signed   By: Marijo Sanes M.D.   On: 11/18/2019 13:19   CT ABDOMEN PELVIS W CONTRAST  Result Date: 11/18/2019 CLINICAL DATA:  86 year old male with abdominal trauma EXAM: CT ANGIOGRAPHY CHEST CT ABDOMEN AND PELVIS WITH CONTRAST TECHNIQUE: Multidetector CT imaging of the chest was performed using the standard protocol during bolus administration of intravenous contrast. Multiplanar CT image reconstructions and MIPs were obtained to evaluate the vascular anatomy. Multidetector CT imaging of the abdomen and pelvis was performed  using the standard protocol during bolus administration of intravenous contrast. CONTRAST:  38mL OMNIPAQUE IOHEXOL 350 MG/ML SOLN COMPARISON:  09/18/2015 FINDINGS: CTA CHEST FINDINGS Cardiovascular: Heart: No cardiomegaly. No pericardial fluid/thickening. Calcifications of left main, left anterior descending, circumflex, right coronary arteries. Aorta: Calcifications of the aortic valve. Unremarkable course caliber and contour of the thoracic aorta. Moderate atherosclerosis of the aorta including some irregular intimal plaque of the sending thoracic aorta. No ulcerated plaque or evidence of dissection. Pulmonary arteries: No central, lobar, segmental, or proximal subsegmental filling defects. Mediastinum/Nodes: Multiple small mediastinal lymph nodes throughout all nodal stations. Unremarkable appearance of the thoracic esophagus. Unremarkable thoracic inlet Lungs/Pleura: Centrilobular and paraseptal emphysema. Respiratory motion limits evaluation for small nodules. No pneumothorax or pleural effusion. No confluent airspace disease. Review of the MIP images confirms the above findings. CT ABDOMEN and PELVIS FINDINGS Hepatobiliary: Unremarkable liver. Gallbladder is distended with radiopaque stones in the neck of the gallbladder. No significant pericholecystic fluid. Mild inflammatory changes of the gallbladder wall and the adjacent fat. No definite radiopaque stones identified in the region of the common bile duct. Pancreas: Unremarkable Spleen: Unremarkable Adrenals/Urinary Tract: - Right adrenal gland:  Unremarkable - Left adrenal gland: Unremarkable. - Right kidney: No hydronephrosis, nephrolithiasis, inflammation, or ureteral dilation. Hypodense lesion on the posterior cortex of the right kidney is too small to characterize. Hyperdense focal lesion on the lateral cortex of the right kidney was present on the comparison CT and most likely represents a complex cyst given the decreasing size though is strictly too  small to characterize. - Left Kidney: No hydronephrosis, nephrolithiasis, inflammation, or ureteral dilation. No focal lesion. - Urinary Bladder: Urinary bladder relatively decompressed. Stomach/Bowel: - Stomach: Small hiatal hernia.  Otherwise unremarkable stomach. - Small bowel: Unremarkable - Appendix: Normal. - Colon: Colonic diverticula of the sigmoid colon and the left colon. No focal inflammatory changes. No bowel obstruction. Mild stool burden. Vascular/Lymphatic: Atherosclerosis of the abdominal aorta. No periaortic fluid or inflammatory changes. Greatest diameter of the aorta is estimated 3.4 cm. Irregular plaque versus chronic dissection just above the bifurcation. Atherosclerotic changes of the bilateral iliac arteries. Bilateral iliac arteries are patent. Proximal femoral arteries are patent. Reproductive: Calcifications of the prostate. Other: Fat containing umbilical hernia. Musculoskeletal: Osteopenia. Degenerative changes of the visualized spine no acute displaced vertebral body fracture. Kyphotic deformity of the thoracic spine likely degenerative. No bony canal narrowing.  No aggressive bony lesions identified. Treatment changes of prior vertebral augmentation of L3. Surgical changes of prior left femur repair. Review of the MIP images confirms the above findings. IMPRESSION: No acute finding of the chest CT angiogram. Cholelithiasis with distended gallbladder and  vague inflammatory changes of the gallbladder wall. These findings may reflect early acute cholecystitis. Correlation with lab values and patient presentation may be useful, as well as consideration of confirmatory HIDA study. Ultrasound will likely yield no further useful imaging information. Emphysema (ICD10-J43.9). Aortic Atherosclerosis (ICD10-I70.0). Associated coronary artery disease, bilateral iliac arterial disease, irregular plaque of the abdominal aorta. Diverticular disease without evidence of acute diverticulitis. Additional  ancillary findings as above. Electronically Signed   By: Corrie Mckusick D.O.   On: 11/18/2019 13:26   MR 3D Recon At Scanner  Result Date: 11/18/2019 CLINICAL DATA:  Cholelithiasis with elevated bilirubin. EXAM: MRI ABDOMEN WITHOUT AND WITH CONTRAST (INCLUDING MRCP) TECHNIQUE: Multiplanar multisequence MR imaging of the abdomen was performed both before and after the administration of intravenous contrast. Heavily T2-weighted images of the biliary and pancreatic ducts were obtained, and three-dimensional MRCP images were rendered by post processing. CONTRAST:  67mL GADAVIST GADOBUTROL 1 MMOL/ML IV SOLN COMPARISON:  CT scan 11/18/2019 FINDINGS: Motion degraded study due to patient inability to reproducibly breath hold. Lower chest: Unremarkable. Hepatobiliary: 8 mm cyst noted inferior right liver. Gallbladder is distended with layering tiny gallstones. Common duct measures 7 mm diameter. There is abnormal signal in the distal common bile duct. This is not well evaluated due to motion artifact and could represent sludge impacted in the distal common bile duct although tiny layering/stacked gallstones cannot be excluded. Pancreas: Limited assessment due to motion degradation on postcontrast imaging. No focal mass lesion. No dilatation of the main duct. No intraparenchymal cyst. No peripancreatic edema. Spleen:  No gross abnormality. Adrenals/Urinary Tract: No adrenal nodule or mass. Bilateral renal cysts. Exophytic 10 mm complex cystic lesion in the interpolar right kidney (image 27/3 and image 330/18). This is not well evaluated due to motion degradation but comparing today's CT to a CT scan from 09/18/2015, this lesion has decreased in size in the interval consistent with benign etiology. Stomach/Bowel: Stomach is moderately distended. No small bowel or colonic dilatation within the visualized abdomen. Vascular/Lymphatic: Atherosclerotic changes noted in the abdominal aorta. No abdominal lymphadenopathy. Other:  No  intraperitoneal free fluid. Musculoskeletal: No suspicious marrow enhancement within the visualized bony anatomy. IMPRESSION: 1. Motion degraded study due to patient inability to reproducibly breath hold. 2. There is abnormal signal in the 7 mm diameter distal common bile duct with loss of normal T2 hyperintensity. This is not well evaluated due to motion artifact and could represent sludge or stacked gallstones. No suspicious enhancement to suggest soft tissue mass, but follow-up likely warranted. 3. Cholelithiasis. 4. Right renal cysts. Electronically Signed   By: Misty Stanley M.D.   On: 11/18/2019 16:04   DG CHEST PORT 1 VIEW  Result Date: 11/20/2019 CLINICAL DATA:  COPD, possible CHF. Mild pulmonary edema or might chest x-ray. Evaluate for change. EXAM: PORTABLE CHEST 1 VIEW COMPARISON:  Chest x-rays dated 11/19/2019 and 09/05/2015. FINDINGS: Continued central pulmonary vascular congestion and mild bilateral interstitial edema. Probable mild bibasilar atelectasis and/or small pleural effusions. Heart size and mediastinal contours are stable in size and configuration. No pneumothorax is seen. Osseous structures about the chest are unremarkable. IMPRESSION: 1. No interval change. Persistent central pulmonary vascular congestion and mild bilateral interstitial edema suggesting mild volume overload/CHF. 2. Probable mild bibasilar atelectasis and/or small pleural effusions. Electronically Signed   By: Franki Cabot M.D.   On: 11/20/2019 11:52   DG CHEST PORT 1 VIEW  Result Date: 11/19/2019 CLINICAL DATA:  86 year old male with shortness of breath EXAM: PORTABLE CHEST 1  VIEW COMPARISON:  Prior chest x-ray 09/05/2015 FINDINGS: Very low inspiratory volumes. Pulmonary vascular congestion is present bordering on mild edema. Nonspecific patchy bibasilar airspace opacities. Cardiac and mediastinal contours are within normal limits. No pneumothorax. No acute osseous abnormality. IMPRESSION: 1. Pulmonary vascular  congestion bordering on mild edema. 2. Very low inspiratory volumes. 3. Nonspecific bibasilar patchy airspace opacities which could reflect atelectasis, dependent edema or infiltrate. Electronically Signed   By: Jacqulynn Cadet M.D.   On: 11/19/2019 16:18   DG ERCP  Result Date: 11/19/2019 CLINICAL DATA:  86 year old male with choledocholithiasis EXAM: ERCP TECHNIQUE: Multiple spot images obtained with the fluoroscopic device and submitted for interpretation post-procedure. FLUOROSCOPY TIME:  Fluoroscopy Time:  12 minutes 57 seconds COMPARISON:  MRCP 11/18/2019 FINDINGS: A total of 4 intraoperative saved images are submitted for review. The images demonstrate a flexible endoscope in the descending duodenum with wire cannulation of the common bile duct. Retrograde cholangiogram was then performed. The images demonstrate multiple small filling defects in the common bile duct consistent with reported history of choledocholithiasis. The cystic duct is patent. Opacification of the gallbladder demonstrates that it is filled with numerous filling defects consistent with small stones. No significant biliary ductal dilatation. On the final image, the common bile duct appears clear and a small amount of contrast is seen within the duodenum. IMPRESSION: 1. Cholelithiasis. 2. Choledocholithiasis. These images were submitted for radiologic interpretation only. Please see the procedural report for the amount of contrast and the fluoroscopy time utilized. Electronically Signed   By: Jacqulynn Cadet M.D.   On: 11/19/2019 15:43   MR ABDOMEN MRCP W WO CONTAST  Result Date: 11/18/2019 CLINICAL DATA:  Cholelithiasis with elevated bilirubin. EXAM: MRI ABDOMEN WITHOUT AND WITH CONTRAST (INCLUDING MRCP) TECHNIQUE: Multiplanar multisequence MR imaging of the abdomen was performed both before and after the administration of intravenous contrast. Heavily T2-weighted images of the biliary and pancreatic ducts were obtained, and  three-dimensional MRCP images were rendered by post processing. CONTRAST:  19mL GADAVIST GADOBUTROL 1 MMOL/ML IV SOLN COMPARISON:  CT scan 11/18/2019 FINDINGS: Motion degraded study due to patient inability to reproducibly breath hold. Lower chest: Unremarkable. Hepatobiliary: 8 mm cyst noted inferior right liver. Gallbladder is distended with layering tiny gallstones. Common duct measures 7 mm diameter. There is abnormal signal in the distal common bile duct. This is not well evaluated due to motion artifact and could represent sludge impacted in the distal common bile duct although tiny layering/stacked gallstones cannot be excluded. Pancreas: Limited assessment due to motion degradation on postcontrast imaging. No focal mass lesion. No dilatation of the main duct. No intraparenchymal cyst. No peripancreatic edema. Spleen:  No gross abnormality. Adrenals/Urinary Tract: No adrenal nodule or mass. Bilateral renal cysts. Exophytic 10 mm complex cystic lesion in the interpolar right kidney (image 27/3 and image 330/18). This is not well evaluated due to motion degradation but comparing today's CT to a CT scan from 09/18/2015, this lesion has decreased in size in the interval consistent with benign etiology. Stomach/Bowel: Stomach is moderately distended. No small bowel or colonic dilatation within the visualized abdomen. Vascular/Lymphatic: Atherosclerotic changes noted in the abdominal aorta. No abdominal lymphadenopathy. Other:  No intraperitoneal free fluid. Musculoskeletal: No suspicious marrow enhancement within the visualized bony anatomy. IMPRESSION: 1. Motion degraded study due to patient inability to reproducibly breath hold. 2. There is abnormal signal in the 7 mm diameter distal common bile duct with loss of normal T2 hyperintensity. This is not well evaluated due to motion artifact and  could represent sludge or stacked gallstones. No suspicious enhancement to suggest soft tissue mass, but follow-up likely  warranted. 3. Cholelithiasis. 4. Right renal cysts. Electronically Signed   By: Misty Stanley M.D.   On: 11/18/2019 16:04   ECHOCARDIOGRAM COMPLETE  Result Date: 11/20/2019    ECHOCARDIOGRAM REPORT   Patient Name:   SADIKI LOLLIE Date of Exam: 11/20/2019 Medical Rec #:  [id number]          Height:       69.0 in Accession #:    [id number]         Weight:       231.0 lb Date of Birth:  [birth year]         BSA:          2.197 m Patient Age:    63 years           BP:           109/57 mmHg Patient Gender: M                  HR:           70 bpm. Exam Location:  Inpatient Procedure: 2D Echo, Cardiac Doppler and Color Doppler Indications:    Pulmonary Vascular Congestion [id number]  History:        Patient has no prior history of Echocardiogram examinations.                 TIA, PAD and COPD, Signs/Symptoms:Dyspnea; Risk                 Factors:Hypertension and Dyslipidemia. Cancer.  Sonographer:    Tiffany Dance Referring Phys: [id number] [place]  1. Left ventricular ejection fraction, by estimation, is 55 to 60%. The left ventricle has normal function. The left ventricle has no regional wall motion abnormalities. There is mild left ventricular hypertrophy. Left ventricular diastolic parameters are consistent with Grade I diastolic dysfunction (impaired relaxation). Elevated left ventricular end-diastolic pressure.  2. Right ventricular systolic function is mildly reduced. The right ventricular size is normal. There is moderately elevated pulmonary artery systolic pressure. The estimated right ventricular systolic pressure is 123456 mmHg.  3. Left atrial size was mildly dilated.  4. The mitral valve is abnormal. Mild mitral valve regurgitation.  5. The aortic valve is tricuspid. Aortic valve regurgitation is not visualized. Moderate aortic valve stenosis. Aortic valve area, by VTI measures 1.44 cm. Aortic valve mean gradient measures 17.3 mmHg. Aortic valve Vmax measures 2.62 m/s.  6. The inferior vena  cava is dilated in size with <50% respiratory variability, suggesting right atrial pressure of 15 mmHg. FINDINGS  Left Ventricle: Left ventricular ejection fraction, by estimation, is 55 to 60%. The left ventricle has normal function. The left ventricle has no regional wall motion abnormalities. The left ventricular internal cavity size was normal in size. There is  mild left ventricular hypertrophy. Left ventricular diastolic parameters are consistent with Grade I diastolic dysfunction (impaired relaxation). Elevated left ventricular end-diastolic pressure. Right Ventricle: The right ventricular size is normal. No increase in right ventricular wall thickness. Right ventricular systolic function is mildly reduced. There is moderately elevated pulmonary artery systolic pressure. The tricuspid regurgitant velocity is 2.93 m/s, and with an assumed right atrial pressure of 15 mmHg, the estimated right ventricular systolic pressure is 123456 mmHg. Left Atrium: Left atrial size was mildly dilated. Right Atrium: Right atrial size was normal in size. Pericardium: There is no evidence of pericardial effusion. Mitral  Valve: The mitral valve is abnormal. There is mild thickening of the mitral valve leaflet(s). There is moderate calcification of the mitral valve leaflet(s). Mild mitral valve regurgitation. Tricuspid Valve: The tricuspid valve is grossly normal. Tricuspid valve regurgitation is mild. Aortic Valve: The aortic valve is tricuspid. . There is moderate thickening and moderate calcification of the aortic valve. Aortic valve regurgitation is not visualized. Moderate aortic stenosis is present. Mild aortic valve annular calcification. There is moderate thickening of the aortic valve. There is moderate calcification of the aortic valve. Aortic valve mean gradient measures 17.3 mmHg. Aortic valve peak gradient measures 27.5 mmHg. Aortic valve area, by VTI measures 1.44 cm. Pulmonic Valve: The pulmonic valve was grossly  normal. Pulmonic valve regurgitation is trivial. Aorta: The aortic root and ascending aorta are structurally normal, with no evidence of dilitation. Venous: The inferior vena cava is dilated in size with less than 50% respiratory variability, suggesting right atrial pressure of 15 mmHg. IAS/Shunts: No atrial level shunt detected by color flow Doppler.  LEFT VENTRICLE PLAX 2D LVIDd:         4.30 cm  Diastology LVIDs:         3.92 cm  LV e' lateral:   7.72 cm/s LV PW:         1.48 cm  LV E/e' lateral: 13.9 LV IVS:        1.05 cm  LV e' medial:    5.33 cm/s LVOT diam:     1.90 cm  LV E/e' medial:  20.1 LV SV:         97 LV SV Index:   44 LVOT Area:     2.84 cm  RIGHT VENTRICLE            IVC RV Basal diam:  4.08 cm    IVC diam: 2.13 cm RV Mid diam:    2.78 cm RV S prime:     9.25 cm/s TAPSE (M-mode): 1.7 cm LEFT ATRIUM              Index       RIGHT ATRIUM           Index LA diam:        5.40 cm  2.46 cm/m  RA Area:     20.30 cm LA Vol (A2C):   115.0 ml 52.35 ml/m RA Volume:   47.20 ml  21.49 ml/m LA Vol (A4C):   55.2 ml  25.13 ml/m LA Biplane Vol: 81.1 ml  36.92 ml/m  AORTIC VALVE AV Area (Vmax):    1.46 cm AV Area (Vmean):   1.46 cm AV Area (VTI):     1.44 cm AV Vmax:           262.33 cm/s AV Vmean:          194.333 cm/s AV VTI:            0.674 m AV Peak Grad:      27.5 mmHg AV Mean Grad:      17.3 mmHg LVOT Vmax:         134.67 cm/s LVOT Vmean:        100.300 cm/s LVOT VTI:          0.343 m LVOT/AV VTI ratio: 0.51  AORTA Ao Root diam: 3.90 cm Ao Asc diam:  3.60 cm MITRAL VALVE                TRICUSPID VALVE MV Area (PHT): 3.77 cm  TR Peak grad:   34.3 mmHg MV Decel Time: 201 msec     TR Vmax:        293.00 cm/s MV E velocity: 107.00 cm/s MV A velocity: 101.00 cm/s  SHUNTS MV E/A ratio:  1.06         Systemic VTI:  0.34 m                             Systemic Diam: 1.90 cm [name] Bishop MD Electronically signed by [name] Bishop MD Signature Date/Time: 11/20/2019/5:05:08 PM    Final      Time Spent in  minutes  30     Desiree Hane M.D on 11/22/2019 at 6:29 PM  To page go to [email or web address] - password [facility]

## 2019-11-22 NOTE — Progress Notes (Signed)
Pharmacy Antibiotic Note  David Morgan is a 86 y.o. male on day # 67 Zosyn for choledocholithiasis without cholecystitis. S/p ERCP on 4/3, LFTs improved. No surgery planned. Changing to oral Cipro and Metronidazole today. Pharmacy has been consulted for Cipro PO dosing.    86 yrs old, 107 kg and creatinine 1.13.  Plan:  Cipro 500 mg PO BID.  Metronidazole 500 mg PO q8h   Follow renal function for any need to adjust Cipro regimen.  Follow up length of therapy.  Height: 5\' 9"  (175.3 cm) Weight: 107.1 kg (236 lb 1.8 oz) IBW/kg (Calculated) : 70.7  Temp (24hrs), Avg:97.9 F (36.6 C), Min:97.4 F (36.3 C), Max:98.2 F (36.8 C)  Recent Labs  Lab 11/18/19 1138 11/18/19 1604 11/18/19 1818 11/19/19 0255 11/20/19 0340 11/21/19 0252 11/22/19 0255  WBC 23.5*  --   --  26.4* 13.0* 6.9 7.9  CREATININE 1.34*  --   --  1.35* 1.96* 1.49* 1.13  LATICACIDVEN  --  3.3* 2.2*  --   --   --   --     Estimated Creatinine Clearance: 50.3 mL/min (by C-G formula based on SCr of 1.13 mg/dL).    Allergies  Allergen Reactions  . Adhesive [Tape] Rash  . Augmentin [Amoxicillin-Pot Clavulanate] Itching, Rash and Other (See Comments)    Has patient had a PCN reaction causing immediate rash, facial/tongue/throat swelling, SOB or lightheadedness with hypotension: No Has patient had a PCN reaction causing severe rash involving mucus membranes or skin necrosis: No Has patient had a PCN reaction that required hospitalization No Has patient had a PCN reaction occurring within the last 10 years: No If all of the above answers are "NO", then may proceed with Cephalosporin use.     Antimicrobials this admission:  Zosyn 4/3>>4/6  Cipro PO 4/6 >>  Metronidazole 4/6 >>  Dose adjustments this admission:  n/a  Microbiology results:  No cultures  Thank you for allowing pharmacy to be a part of this patient's care.  Arty Baumgartner, [place] Phone: [phone] 11/22/2019 1:24 PM

## 2019-11-22 NOTE — Progress Notes (Signed)
Physical Therapy Treatment Patient Details Name: David Morgan MRN: [id number] DOB: [birth year] Today's Date: 11/22/2019    History of Present Illness Pt is a 86 yr old male who presented due to [facility] ED due to fall.  PMH: hypertension, hyperlipidemia, type 2 diabetes mellitus, hypothyroidism, BPH and dementia    PT Comments    Pt OOB upon arrival of PT, agreeable to session with focus on mobility progression at this time. The pt was able to demo sig improved tolerance for activity today as he completed 3x sit-stand from recliner with minA of 2 with RW for stability. The pt continues to demo sig weakness/fatigue with activity, requiring seated rest after ~1-2 min standing or 3-5 steps with minA and RW. The pt will continue to benefit from skilled PT to further progress functional mobility and activity tolerance.     Follow Up Recommendations  SNF;Supervision/Assistance - 24 hour     Equipment Recommendations  None recommended by PT(defer to post acute)    Recommendations for Other Services       Precautions / Restrictions Precautions Precautions: Fall Precaution Comments: needs commands/questions written on white board Restrictions Weight Bearing Restrictions: No    Mobility  Bed Mobility               General bed mobility comments: pt OOB in recliner  Transfers Overall transfer level: Needs assistance Equipment used: Rolling walker (2 wheeled) Transfers: Sit to/from Stand Sit to Stand: Min assist;+2 physical assistance         General transfer comment: pt able to stand x3 from recliner with minA of 2 and RW for stability, stands with sig trunk flexion, able to correct with tactile cues  Ambulation/Gait Ambulation/Gait assistance: Min assist;+2 physical assistance Gait Distance (Feet): 2 Feet Assistive device: Rolling walker (2 wheeled) Gait Pattern/deviations: Step-to pattern;Decreased stride length;Shuffle;Trunk flexed   Gait velocity interpretation:  <1.31 ft/sec, indicative of household ambulator General Gait Details: pt able to take a few very small stesp wtih minA and use of RW, chair follow for safety, VSS but pt fatigues quickly.   Stairs             Wheelchair Mobility    Modified Rankin (Stroke Patients Only)       Balance Overall balance assessment: Needs assistance Sitting-balance support: Feet supported;Bilateral upper extremity supported Sitting balance-Leahy Scale: Fair     Standing balance support: Bilateral upper extremity supported Standing balance-Leahy Scale: Poor Standing balance comment: minA of 2 to stand, then minA of 1 to stabilize                            Cognition Arousal/Alertness: Awake/alert Behavior During Therapy: WFL for tasks assessed/performed Overall Cognitive Status: Within Functional Limits for tasks assessed                                 General Comments: pt did better when writting down instructions, generally cooperative, redirectable, agreeable to pace set by PT      Exercises      General Comments General comments (skin integrity, edema, etc.): 1.5L O2, 3/4 DOE but VSS      Pertinent Vitals/Pain Pain Assessment: Faces Faces Pain Scale: Hurts a little bit Pain Location: abdomen Pain Descriptors / Indicators: Discomfort;Grimacing;Guarding Pain Intervention(s): Limited activity within patient's tolerance;Monitored during session    Home Living  Prior Function            PT Goals (current goals can now be found in the care plan section) Acute Rehab PT Goals Patient Stated Goal: none reported PT Goal Formulation: Patient unable to participate in goal setting Time For Goal Achievement: 12/03/19 Potential to Achieve Goals: Fair Progress towards PT goals: Progressing toward goals    Frequency    Min 2X/week      PT Plan Current plan remains appropriate    Co-evaluation              AM-PAC  PT "6 Clicks" Mobility   Outcome Measure  Help needed turning from your back to your side while in a flat bed without using bedrails?: A Lot Help needed moving from lying on your back to sitting on the side of a flat bed without using bedrails?: A Lot Help needed moving to and from a bed to a chair (including a wheelchair)?: A Lot Help needed standing up from a chair using your arms (e.g., wheelchair or bedside chair)?: A Little Help needed to walk in hospital room?: A Lot Help needed climbing 3-5 steps with a railing? : Total 6 Click Score: 12    End of Session Equipment Utilized During Treatment: Gait belt;Oxygen(white board for communication) Activity Tolerance: Patient limited by fatigue;Patient tolerated treatment well Patient left: in bed;with call bell/phone within reach;with bed alarm set Nurse Communication: Mobility status PT Visit Diagnosis: Other abnormalities of gait and mobility (R26.89);Unsteadiness on feet (R26.81);Muscle weakness (generalized) (M62.81)     Time: [id number] PT Time Calculation (min) (ACUTE ONLY): 21 min  Charges:  $Gait Training: 8-22 mins                     Karma Ganja, PT, DPT   Acute Rehabilitation Department Pager #: [phone]   Otho Bellows 11/22/2019, 2:04 PM

## 2019-11-22 NOTE — Care Management Important Message (Signed)
Important Message  Patient Details  Name: MIYON VOIGHT MRN: [id number] Date of Birth: 1925-06-27   Medicare Important Message Given:  Yes     Shelda Altes 11/22/2019, 9:51 AM

## 2019-11-23 LAB — COMPREHENSIVE METABOLIC PANEL
ALT: 67 U/L — ABNORMAL HIGH (ref 0–44)
AST: 45 U/L — ABNORMAL HIGH (ref 15–41)
Albumin: 2.9 g/dL — ABNORMAL LOW (ref 3.5–5.0)
Alkaline Phosphatase: 136 U/L — ABNORMAL HIGH (ref 38–126)
Anion gap: 15 (ref 5–15)
BUN: 39 mg/dL — ABNORMAL HIGH (ref 8–23)
CO2: 24 mmol/L (ref 22–32)
Calcium: 8.6 mg/dL — ABNORMAL LOW (ref 8.9–10.3)
Chloride: 107 mmol/L (ref 98–111)
Creatinine, Ser: 1.02 mg/dL (ref 0.61–1.24)
GFR calc Af Amer: >60 mL/min (ref >60)
GFR calc non Af Amer: >60 mL/min (ref >60)
Glucose, Bld: 172 mg/dL — ABNORMAL HIGH (ref 70–99)
Potassium: 3.6 mmol/L (ref 3.5–5.1)
Sodium: 146 mmol/L — ABNORMAL HIGH (ref 135–145)
Total Bilirubin: 1.8 mg/dL — ABNORMAL HIGH (ref 0.3–1.2)
Total Protein: 7.1 g/dL (ref 6.5–8.1)

## 2019-11-23 LAB — CBC
HCT: 45.4 % (ref 39.0–52.0)
Hemoglobin: 14.5 g/dL (ref 13.0–17.0)
MCH: 28.7 pg (ref 26.0–34.0)
MCHC: 31.9 g/dL (ref 30.0–36.0)
MCV: 89.9 fL (ref 80.0–100.0)
Platelets: 181 10*3/uL (ref 150–400)
RBC: 5.05 MIL/uL (ref 4.22–5.81)
RDW: 14.6 % (ref 11.5–15.5)
WBC: 8.8 10*3/uL (ref 4.0–10.5)
nRBC: 0 % (ref 0.0–0.2)

## 2019-11-23 LAB — GLUCOSE, CAPILLARY
Glucose-Capillary: 184 mg/dL — ABNORMAL HIGH (ref 70–99)
Glucose-Capillary: 199 mg/dL — ABNORMAL HIGH (ref 70–99)
Glucose-Capillary: 229 mg/dL — ABNORMAL HIGH (ref 70–99)
Glucose-Capillary: 179 mg/dL — ABNORMAL HIGH (ref 70–99)

## 2019-11-23 LAB — CULTURE, BLOOD (ROUTINE X 2)
Culture: NO GROWTH
Culture: NO GROWTH

## 2019-11-23 LAB — SARS CORONAVIRUS 2 (TAT 6-24 HRS): SARS Coronavirus 2: NEGATIVE

## 2019-11-23 MED ORDER — IPRATROPIUM-ALBUTEROL 0.5-2.5 (3) MG/3ML IN SOLN: 3.0000 mL | RESPIRATORY_TRACT | Status: DC | PRN

## 2019-11-23 NOTE — TOC Progression Note (Signed)
Transition of Care [facility]) - Progression Note    Patient Details  Name: David Morgan MRN: [id number] Date of Birth: 09-07-1924  Transition of Care [facility]) CM/SW [place], [place] Phone Number: 11/23/2019, 10:25 AM  Clinical Narrative:     Patient insurance authorization pending for SNF and PTAR-  CSW confirmed bed offer with [facility]-   CSW updated RN and requested covid test .   Thurmond Butts, MSW, [place] Clinical Social Worker    Expected Discharge Plan: [place]    Expected Discharge Plan and Services Expected Discharge Plan: [place] In-house Referral: Clinical Social Work     Living arrangements for the past 2 months: Single Family Home                                       Social Determinants of Health (SDOH) Interventions    Readmission Risk Interventions No flowsheet data found.

## 2019-11-23 NOTE — Progress Notes (Signed)
PROGRESS NOTE    David Morgan  [id number] DOB: Feb 26, 1925 DOA: 11/18/2019 PCP: [name], MD    Brief Narrative:  David Morgan is a 86 y.o. year old male with medical history significant for emphysematous COPD, HTN, HLD, hypothyroidism who presented on 11/18/2019 to [facility] ED with complaints of recurrent falls from home and worsening confusion per wife report. Upon EMS evaluation patient complained of abdominal pain and noted to have worsening breathing.  At [facility] ED patient was noted to be tachypneic, BP 164/76, afebrile with leukocytosis, lactic acidosis of 3.3, and AKI, creatinine 1.34, baseline 0.6-0.8, with elevated bilirubin of 4.6.  Underwent CT head and cervical spine given fall which was negative for acute findings. CT abdomen concerning for distended gallbladder with inflammatory changes worrisome for acute cholecystitis. MRCP .  General surgery was consulted in ED who recommended GI consultation.  GI recommended MRCP which showed distended gallbladder with stones and abnormal signal of CBD that could be sludge or artifact ( study was difficult).  Patient was transferred to [facility] for ERCP.  He received Zosyn in the ED  Hospital Course: Underwent ERCP on 4/3 with biliary sphincterotomy and baloon extraction for choledocholithiasis without cholecystitis. Patient has been continued on zosyn for possible cholangitis given leukocytosis and elevated procalcitonin on admission. Surgery recommended conservative care and no cholecystectomy for cholelithiasis given no cholecystitis on imaging and s/p biliary sphincterotomy decreases the likelihood and since his age and overall makes the risk of surgery outweigh the benefit   Assessment & Plan:   Principal Problem:   Cholelithiasis Active Problems:   Disease of thyroid gland   BP (high blood pressure)   HLD (hyperlipidemia)   BPH (benign prostatic hyperplasia)   Elevated liver enzymes   Dementia without behavioral  disturbance ([place])   Accident due to mechanical fall without injury   Hyperglycemia due to type 2 diabetes mellitus (HCC)   Leukocytosis   AKI (acute kidney injury) ([place])   Troponin I above reference range   COPD with acute exacerbation (HCC)   Severe sepsis (HCC)   Pulmonary vascular congestion   Choledocholithiasis   Acute diastolic CHF (congestive heart failure) (HCC)  Elevated LFTs and hyperbilirubinemia and abdominal pain secondary to choledocholithiasis without cholecystitis, improving. Partial obstruction found on ERCP with biliary sphincterotomy and balloon extraction on 4/3. LFTs and bilirubin downtrending, abdominal pain improving. Remains afebrile and leukocytosis resolved,  Zosyn since 4/3 due to concern for cholangitis and now transitioned to ciprofloxacin and Flagyl.  -monitor blood cultures --GI consulted,signed off--agree with antibiotics --General Surgery, signed off--no plan for surgery for cholelithiasis given risk outweigh benefit  Severe sepsis presumed secondary to choledocholithiasis with concern for cholangitis, resolved.  Afebrile, lactic acidosis resolved, leukocytosis downtrending. Blood cultures unremarkable to date.   Other sources of infection ruled out with unremarkable UA, ct abd and chest imaging --daily cbc --Zosyn to changed to cipro and flagyl on 4/6; plan 10-day antibiotic course --monitor blood cultures  AKI, improving  Likely multifactorial large related to decreased p.o. intake, infectious etiology.  Previous baseline 0.6-0.92 (from 2019).  Peak of 1.95 now 1.02. S/p lasix for pulmonary edema on CXR. UA bland --continue lasix 22m PO daily given persistent crackles, wheezing and congestion on CXR --Avoid nephrotoxins --Daily BMP --Monitor output  Witnessed falls at home Suspect related to worsening confusion on top of dementia related to likely infection above. Trauma imaging negative for acute findings --PT/OT rec SNF   Worsening confusion,  with baseline dementia without behavioral disturbance.  Suspect infectious encephalopathy, likely worsened with IV solumedrol. CT head neg for acute findings. Alert to self only this am.  -Delirium precautions --wean IV steroids -Home Aricept  COPD, acute exacerbation. On 1 L but with great O2 saturation of 99%. Has wheezing and rhonchi that have improved significantly from day prior  --schedule duo-nebs, wean IV steroid frequency to BID then oral prednisone 40 mg on 4/8 ---scheduled mucinex, flutter valve --wean O2 for goal spO2>88%; now on 1L [place]  Diastolic CHF, exacerbation. Mildly elevated BNP with pulmonary vascular congestion and mild pulmonary edema. TTE shows preserved EF with grade 1 diastolic dysfunction. Net negative 4.3 L this admission, still some crackles and wheezing but significantly improved from prior. Have to be careful with his kidney function --continue lasix 40 mg PO daily --daily weights, I/o  Elevated high-sensitivity troponin, mild, resolved  Suspect demand ischemia in the setting of infectious etiology and severe sepsis.  Peak troponin of 30, downtrending to 26.  A. fib on EKG with no ischemic changes -Closely monitor   Sinus rhythm with PVCs EKG on 4/2 documents A. fib.  No known history per chart review. Currently rate controlled. EKG on 4/4 shows sinus rhythm with PVCs -telemetry monitoring -Potassium 3.6 today, will continue to monitor with magnesium level  Hypertension, stable -Continue home Coreg -Holding HCTZ, losartan given AKI --IV hydralazine PRN  Hypothyroidism -Continue home Synthroid 112 mcg p.o. daily  Type 2 diabetes, controlled. A1c 6.8% -Holding home metformin -Monitor CBGs, sliding scale as needed  BPH, stable -Monitor output, continue home Flomax   DVT prophylaxis: SCDs Code Status: Full code Family Communication: None present at bedside Disposition Plan:      Patient is from: Home     Anticipated Disposition:  SNF,  awaiting placement     Barriers to discharge or conditions that needs to be met prior to discharge: Awaiting SNF placement  Consultants:   Gastroenterology   General surgery  Procedures:   ERCP 11/19/2019  Antimicrobials:   Zosyn 4/3 4/6  Cipro 4/6>>  Flagyl 4/6>>    Subjective: Patient seen and examined at bedside, resting comfortably.  Pleasantly confused.  No significant complaints this morning.  No acute events overnight per nursing staff.  Objective: Vitals:   11/23/19 0600 11/23/19 0749 11/23/19 0812 11/23/19 1055  BP: (!) 166/75  (!) 155/75 (!) 169/86  Pulse:  66 72 75  Resp:  16 15 18   Temp: 97.7 F (36.5 C)  97.8 F (36.6 C) 98.4 F (36.9 C)  TempSrc: Oral  Oral Oral  SpO2: 96% 98% 94% 97%  Weight:      Height:        Intake/Output Summary (Last 24 hours) at 11/23/2019 1548 Last data filed at 11/23/2019 0953 Gross per 24 hour  Intake 190 ml  Output 500 ml  Net -310 ml   Filed Weights   11/21/19 0546 11/22/19 0500 11/23/19 0500  Weight: 103.7 kg 107.1 kg 106.9 kg    Examination:  General exam: Appears calm and comfortable, pleasantly confused Respiratory system: Breath sounds slightly decreased bilateral bases, mild crackles, normal respiratory effort, on 1 L nasal cannula Cardiovascular system: S1 & S2 heard, RRR. No JVD, murmurs, rubs, gallops or clicks. No pedal edema. Gastrointestinal system: Abdomen is nondistended, soft and nontender. No organomegaly or masses felt. Normal bowel sounds heard. Central nervous system: Alert, pleasantly confused. No focal neurological deficits. Extremities: Symmetric 5 x 5 power. Skin: No rashes, lesions or ulcers Psychiatry: Judgement and insight appear poor. Mood &  affect appropriate.     Data Reviewed: I have personally reviewed following labs and imaging studies  CBC: Recent Labs  Lab 11/18/19 1138 11/18/19 1138 11/19/19 0255 11/20/19 0340 11/21/19 0252 11/22/19 0255 11/23/19 0233  WBC 23.5*   <  > 26.4* 13.0* 6.9 7.9 8.8  NEUTROABS 21.7*  --   --   --   --   --   --   HGB 14.6   < > 14.0 11.5* 13.0 12.9* 14.5  HCT 45.4   < > 44.4 36.2* 40.1 40.2 45.4  MCV 89.9   < > 90.8 90.3 88.5 88.4 89.9  PLT 175   < > 162 120* 145* 150 181   < > = values in this interval not displayed.   Basic Metabolic Panel: Recent Labs  Lab 11/19/19 0255 11/20/19 0340 11/20/19 0816 11/21/19 0252 11/22/19 0255 11/23/19 0233  NA 139 139  --  140 141 146*  K 3.7 3.3*  --  3.8 3.2* 3.6  CL 99 104  --  104 105 107  CO2 24 23  --  23 25 24   GLUCOSE 225* 124*  --  234* 234* 172*  BUN 25* 45*  --  47* 40* 39*  CREATININE 1.35* 1.96*  --  1.49* 1.13 1.02  CALCIUM 9.0 8.0*  --  8.1* 8.3* 8.6*  MG  --   --  2.0  --  2.3  --    GFR: Estimated Creatinine Clearance: 55.7 mL/min (by C-G formula based on SCr of 1.02 mg/dL). Liver Function Tests: Recent Labs  Lab 11/19/19 0255 11/20/19 0340 11/21/19 0252 11/22/19 0255 11/23/19 0233  AST 109* 66* 46* 37 45*  ALT 140* 88* 78* 68* 67*  ALKPHOS 86 85 128* 149* 136*  BILITOT 5.0* 4.1* 2.5* 1.9* 1.8*  PROT 7.2 6.1* 7.0 7.1 7.1  ALBUMIN 3.0* 2.4* 2.5* 2.5* 2.9*   Recent Labs  Lab 11/18/19 1138 11/20/19 0340 11/21/19 0252  LIPASE 40 392* 37   No results for input(s): AMMONIA in the last 168 hours. Coagulation Profile: Recent Labs  Lab 11/19/19 0255  INR 1.5*   Cardiac Enzymes: No results for input(s): CKTOTAL, CKMB, CKMBINDEX, TROPONINI in the last 168 hours. BNP (last 3 results) No results for input(s): PROBNP in the last 8760 hours. HbA1C: No results for input(s): HGBA1C in the last 72 hours. CBG: Recent Labs  Lab 11/22/19 1101 11/22/19 1715 11/22/19 2242 11/23/19 0617 11/23/19 1054  GLUCAP 228* 209* 231* 184* 199*   Lipid Profile: No results for input(s): CHOL, HDL, LDLCALC, TRIG, CHOLHDL, LDLDIRECT in the last 72 hours. Thyroid Function Tests: No results for input(s): TSH, T4TOTAL, FREET4, T3FREE, THYROIDAB in the last 72  hours. Anemia Panel: No results for input(s): VITAMINB12, FOLATE, FERRITIN, TIBC, IRON, RETICCTPCT in the last 72 hours. Sepsis Labs: Recent Labs  Lab 11/18/19 1604 11/18/19 1818 11/19/19 0255 11/20/19 0340  PROCALCITON  --   --  7.68 12.04  LATICACIDVEN 3.3* 2.2*  --   --     Recent Results (from the past 240 hour(s))  Blood culture (routine x 2)     Status: None   Collection Time: 11/18/19  4:02 PM   Specimen: BLOOD  Result Value Ref Range Status   Specimen Description BLOOD L HAND  Final   Special Requests   Final    BOTTLES DRAWN AEROBIC AND ANAEROBIC Blood Culture results may not be optimal due to an inadequate volume of blood received in culture bottles   Culture  Final    NO GROWTH 5 DAYS Performed at [facility], [address] [place], [place] [address]    Report Status 11/23/2019 FINAL  Final  Blood culture (routine x 2)     Status: None   Collection Time: 11/18/19  4:04 PM   Specimen: BLOOD  Result Value Ref Range Status   Specimen Description BLOOD R HAND  Final   Special Requests   Final    BOTTLES DRAWN AEROBIC AND ANAEROBIC Blood Culture results may not be optimal due to an inadequate volume of blood received in culture bottles   Culture   Final    NO GROWTH 5 DAYS Performed at [facility], [address] [place], [place] [address]    Report Status 11/23/2019 FINAL  Final  Respiratory Panel by RT PCR (Flu A&B, Covid) - Nasopharyngeal Swab     Status: None   Collection Time: 11/18/19  6:18 PM   Specimen: Nasopharyngeal Swab  Result Value Ref Range Status   SARS Coronavirus 2 by RT PCR NEGATIVE NEGATIVE Final    Comment: (NOTE) SARS-CoV-2 target nucleic acids are NOT DETECTED. The SARS-CoV-2 RNA is generally detectable in upper respiratoy specimens during the acute phase of infection. The lowest concentration of SARS-CoV-2 viral copies this assay can detect is 131 copies/mL. A negative result does not preclude  SARS-Cov-2 infection and should not be used as the sole basis for treatment or other patient management decisions. A negative result may occur with  improper specimen collection/handling, submission of specimen other than nasopharyngeal swab, presence of viral mutation(s) within the areas targeted by this assay, and inadequate number of viral copies (<131 copies/mL). A negative result must be combined with clinical observations, patient history, and epidemiological information. The expected result is Negative. Fact Sheet for Patients:  PinkCheek.be Fact Sheet for Healthcare Providers:  GravelBags.it This test is not yet ap proved or cleared by the [place] FDA and  has been authorized for detection and/or diagnosis of SARS-CoV-2 by FDA under an Emergency Use Authorization (EUA). This EUA will remain  in effect (meaning this test can be used) for the duration of the COVID-19 declaration under Section 564(b)(1) of the Act, 21 U.S.C. section 360bbb-3(b)(1), unless the authorization is terminated or revoked sooner.    Influenza A by PCR NEGATIVE NEGATIVE Final   Influenza B by PCR NEGATIVE NEGATIVE Final    Comment: (NOTE) The Xpert Xpress SARS-CoV-2/FLU/RSV assay is intended as an aid in  the diagnosis of influenza from Nasopharyngeal swab specimens and  should not be used as a sole basis for treatment. Nasal washings and  aspirates are unacceptable for Xpert Xpress SARS-CoV-2/FLU/RSV  testing. Fact Sheet for Patients: PinkCheek.be Fact Sheet for Healthcare Providers: GravelBags.it This test is not yet approved or cleared by the [place] FDA and  has been authorized for detection and/or diagnosis of SARS-CoV-2 by  FDA under an Emergency Use Authorization (EUA). This EUA will remain  in effect (meaning this test can be used) for the duration of the  Covid-19  declaration under Section 564(b)(1) of the Act, 21  U.S.C. section 360bbb-3(b)(1), unless the authorization is  terminated or revoked. Performed at [facility], [address], [place], [place] [address]          Radiology Studies: No results found.      Scheduled Meds: . carvedilol  3.125 mg Oral BID WC  . ciprofloxacin  500 mg Oral BID  . donepezil  5 mg Oral Daily  .  furosemide  40 mg Oral Daily  . guaiFENesin  600 mg Oral BID  . insulin aspart  0-15 Units Subcutaneous TID WC  . insulin aspart  0-5 Units Subcutaneous QHS  . levothyroxine  112 mcg Oral Q0600  . methylPREDNISolone (SOLU-MEDROL) injection  40 mg Intravenous Q12H   Followed by  . [START ON 11/24/2019] predniSONE  40 mg Oral Q breakfast  . metroNIDAZOLE  500 mg Oral Q8H  . tamsulosin  0.4 mg Oral QPM   Continuous Infusions:   LOS: 5 days    Time spent: 35 minutes spent on chart review, discussion with nursing staff, consultants, updating family and interview/physical exam; more than 50% of that time was spent in counseling and/or coordination of care.    Jorgia Manthei J [place] DO [facility] Available via Epic secure chat 7am-7pm After these hours, please refer to coverage provider listed on [email or web address] 11/23/2019, 3:48 PM

## 2019-11-23 NOTE — TOC Progression Note (Signed)
Transition of Care [facility]) - Progression Note    Patient Details  Name: David Morgan MRN: [id number] Date of Birth: 1925-08-02  Transition of Care [facility]) CM/SW [place], [place] Phone Number: 11/23/2019, 4:38 PM  Clinical Narrative:     Marton Redwood to d/c:  HTA Insurance approval - pending Enzo Bi # [phone] [place], MSW, [place] Worker   Expected Discharge Plan: [place]    Expected Discharge Plan and Services Expected Discharge Plan: [place] In-house Referral: Clinical Social Work     Living arrangements for the past 2 months: Single Family Home                                       Social Determinants of Health (SDOH) Interventions    Readmission Risk Interventions No flowsheet data found.

## 2019-11-24 LAB — BASIC METABOLIC PANEL
Anion gap: 11 (ref 5–15)
BUN: 34 mg/dL — ABNORMAL HIGH (ref 8–23)
CO2: 31 mmol/L (ref 22–32)
Calcium: 8.5 mg/dL — ABNORMAL LOW (ref 8.9–10.3)
Chloride: 103 mmol/L (ref 98–111)
Creatinine, Ser: 1.03 mg/dL (ref 0.61–1.24)
GFR calc Af Amer: >60 mL/min (ref >60)
GFR calc non Af Amer: >60 mL/min (ref >60)
Glucose, Bld: 223 mg/dL — ABNORMAL HIGH (ref 70–99)
Potassium: 3.9 mmol/L (ref 3.5–5.1)
Sodium: 145 mmol/L (ref 135–145)

## 2019-11-24 LAB — HEPATIC FUNCTION PANEL
ALT: 83 U/L — ABNORMAL HIGH (ref 0–44)
AST: 71 U/L — ABNORMAL HIGH (ref 15–41)
Albumin: 2.9 g/dL — ABNORMAL LOW (ref 3.5–5.0)
Alkaline Phosphatase: 121 U/L (ref 38–126)
Bilirubin, Direct: 0.7 mg/dL — ABNORMAL HIGH (ref 0.0–0.2)
Indirect Bilirubin: 1.9 mg/dL — ABNORMAL HIGH (ref 0.3–0.9)
Total Bilirubin: 2.6 mg/dL — ABNORMAL HIGH (ref 0.3–1.2)
Total Protein: 7.1 g/dL (ref 6.5–8.1)

## 2019-11-24 LAB — GLUCOSE, CAPILLARY
Glucose-Capillary: 221 mg/dL — ABNORMAL HIGH (ref 70–99)
Glucose-Capillary: 225 mg/dL — ABNORMAL HIGH (ref 70–99)
Glucose-Capillary: 226 mg/dL — ABNORMAL HIGH (ref 70–99)
Glucose-Capillary: 181 mg/dL — ABNORMAL HIGH (ref 70–99)

## 2019-11-24 LAB — MAGNESIUM: Magnesium: 2.3 mg/dL (ref 1.7–2.4)

## 2019-11-24 MED ORDER — FUROSEMIDE 20 MG PO TABS
20.0000 mg | ORAL_TABLET | Freq: Every day | ORAL | Status: DC
  Administered 2019-11-25 – 2019-11-26 (×2): 20 mg via ORAL
  Filled 2019-11-24 (×2): qty 1

## 2019-11-24 NOTE — TOC Progression Note (Signed)
Transition of Care [facility]) - Progression Note    Patient Details  Name: David Morgan MRN: [id number] Date of Birth: 1925/03/15  Transition of Care [facility]) CM/SW [place], [place] Phone Number: 11/24/2019, 12:03 PM  Clinical Narrative:     Patient's insurance HTA requested peer to peer w/ Dr. Amalia Hailey # [phone].  Attending updated on peer to peer request  Thurmond Butts, MSW, [place] Worker   Expected Discharge Plan: [place]    Expected Discharge Plan and Services Expected Discharge Plan: [place] In-house Referral: Clinical Social Work     Living arrangements for the past 2 months: Single Family Home                                       Social Determinants of Health (SDOH) Interventions    Readmission Risk Interventions No flowsheet data found.

## 2019-11-24 NOTE — Progress Notes (Signed)
Plan of care reviewed. Pt's progressing. Alert, oriented to self, but disoriented to time,place and situations, followed commands. Denied pain. Vital signs stable, remained afebrile, EKG sinus arrhythmia/ sinus rhythm with frequent PAC and unifocal PVC, HR 60s-70s, BP 138/104- 166/68 mmHg, on room air SPO2 93-94%, RR 18-24. No immediate distress noted tonight. Continue to monitor.  Kennyth Lose, RN

## 2019-11-24 NOTE — Progress Notes (Addendum)
PROGRESS NOTE    David Morgan  [id number] DOB: 05/23/1925 DOA: 11/18/2019 PCP: [name], MD    Brief Narrative:  David Morgan is a 86 y.o. year old male with medical history significant for emphysematous COPD, HTN, HLD, hypothyroidism who presented on 11/18/2019 to [facility] ED with complaints of recurrent falls from home and worsening confusion per wife report. Upon EMS evaluation patient complained of abdominal pain and noted to have worsening breathing.  At [facility] ED patient was noted to be tachypneic, BP 164/76, afebrile with leukocytosis, lactic acidosis of 3.3, and AKI, creatinine 1.34, baseline 0.6-0.8, with elevated bilirubin of 4.6.  Underwent CT head and cervical spine given fall which was negative for acute findings. CT abdomen concerning for distended gallbladder with inflammatory changes worrisome for acute cholecystitis. MRCP .  General surgery was consulted in ED who recommended GI consultation.  GI recommended MRCP which showed distended gallbladder with stones and abnormal signal of CBD that could be sludge or artifact ( study was difficult).  Patient was transferred to [facility] for ERCP.  He received Zosyn in the ED  Hospital Course: Underwent ERCP on 4/3 with biliary sphincterotomy and baloon extraction for choledocholithiasis without cholecystitis. Patient has been continued on zosyn for possible cholangitis given leukocytosis and elevated procalcitonin on admission. Surgery recommended conservative care and no cholecystectomy for cholelithiasis given no cholecystitis on imaging and s/p biliary sphincterotomy decreases the likelihood and since his age and overall makes the risk of surgery outweigh the benefit   Assessment & Plan:  Severe sepsis Choledocholithiasis Suspected cholangitis -Admitted with abdominal pain, abnormal LFTs -Imaging noted choledocholithiasis -Gastroenterology consulted, underwent ERCP with sphincterotomy on 4/3 -Clinically  improving since -Was on IV Zosyn, now transitioned to oral Cipro and Flagyl -General surgery consulted, not felt to be a good candidate for cholelithiasis, felt that risk outweighs benefits  AKI, improving  Likely multifactorial large related to decreased p.o. intake, infectious etiology.  Previous baseline 0.6-0.92 (from 2019).  Peak of 1.95 now 1.02. S/p lasix for pulmonary edema on CXR. UA bland -continue lasix 20mg  PO daily for 1-2days -Avoid nephrotoxins -Daily BMP  Witnessed falls at home Suspect related to worsening confusion on top of dementia related to likely infection above. -imaging negative for acute findings -PT/OT rec SNF   Worsening confusion, with baseline dementia without behavioral disturbance. Suspect infectious encephalopathy -CT head neg for acute findings. Alert to self only  -Delirium precautions -continue aricept  COPD, . On 1 L but with great O2 saturation of 99%. -Noted to be wheezing few days ago, suspect this was likely from CHF exacerbation, stop steroids and monitor  Acute diastolic CHF -Hospital course complicated by fluid overload, pulmonary edema and hypoxia -Diuresed with IV Lasix -Volume status improving, changed to oral Lasix daily, monitor kidney function  Elevated high-sensitivity troponin, mild, resolved  Suspect demand ischemia in the setting of infectious etiology and severe sepsis.  Peak troponin of 30, downtrending to 26.  A. fib on EKG with no ischemic changes -Closely monitor   Hypertension, stable -Continue home Coreg -Holding HCTZ, losartan given AKI --IV hydralazine PRN  Hypothyroidism -Continue home Synthroid 112 mcg p.o. daily  Type 2 diabetes, controlled. A1c 6.8% -Holding home metformin -Monitor CBGs, sliding scale as needed  BPH, stable -Monitor output, continue home Flomax   DVT prophylaxis: SCDs Code Status: Full code, need further discussion with family, I am unable to reach spouse despite multiple  attempts today  family Communication: None present at bedside, called and left  2 messages for patient's wife Ah Dood Disposition Plan:      Patient is from: Home     Anticipated Disposition:  Needs SNF versus long-term placement, to be determined, insurance has not approved SNF as he is felt to require long-term care  Consultants:   Gastroenterology   General surgery  Procedures:   ERCP 11/19/2019  Antimicrobials:   Zosyn 4/3 4/6  Cipro 4/6>>  Flagyl 4/6>>    Subjective: -No events overnight, complains of pain everywhere, mild abdominal discomfort reported  Objective: Vitals:   11/23/19 2358 11/24/19 0435 11/24/19 0831 11/24/19 1111  BP: (!) 166/68 (!) 188/95 (!) 158/89 (!) 153/87  Pulse: 69 (!) 53 78   Resp: (!) 22 18    Temp: 97.7 F (36.5 C) 98.1 F (36.7 C)  97.8 F (36.6 C)  TempSrc: Oral Oral  Oral  SpO2: 94% 92%    Weight:  99.3 kg    Height:        Intake/Output Summary (Last 24 hours) at 11/24/2019 1304 Last data filed at 11/24/2019 0900 Gross per 24 hour  Intake 610 ml  Output 601 ml  Net 9 ml   Filed Weights   11/22/19 0500 11/23/19 0500 11/24/19 0435  Weight: 107.1 kg 106.9 kg 99.3 kg    Examination:  Gen: Elderly frail chronically ill male sitting up in bed, pleasantly confused, oriented to self and partly to place only HEENT: PERRLA, Neck supple, no JVD Lungs: Few basilar rales CVS: S1-S2, regular rate rhythm  abd: soft, Non tender, non distended, BS present Extremities: No edema Skin: no new rashes on exposed skin Psychiatry: Poor insight and judgment    Data Reviewed: I have personally reviewed following labs and imaging studies  CBC: Recent Labs  Lab 11/18/19 1138 11/18/19 1138 11/19/19 0255 11/20/19 0340 11/21/19 0252 11/22/19 0255 11/23/19 0233  WBC 23.5*   < > 26.4* 13.0* 6.9 7.9 8.8  NEUTROABS 21.7*  --   --   --   --   --   --   HGB 14.6   < > 14.0 11.5* 13.0 12.9* 14.5  HCT 45.4   < > 44.4 36.2* 40.1 40.2  45.4  MCV 89.9   < > 90.8 90.3 88.5 88.4 89.9  PLT 175   < > 162 120* 145* 150 181   < > = values in this interval not displayed.   Basic Metabolic Panel: Recent Labs  Lab 11/20/19 0340 11/20/19 0816 11/21/19 0252 11/22/19 0255 11/23/19 0233 11/24/19 0359  NA 139  --  140 141 146* 145  K 3.3*  --  3.8 3.2* 3.6 3.9  CL 104  --  104 105 107 103  CO2 23  --  23 25 24 31   GLUCOSE 124*  --  234* 234* 172* 223*  BUN 45*  --  47* 40* 39* 34*  CREATININE 1.96*  --  1.49* 1.13 1.02 1.03  CALCIUM 8.0*  --  8.1* 8.3* 8.6* 8.5*  MG  --  2.0  --  2.3  --  2.3   GFR: Estimated Creatinine Clearance: 53.1 mL/min (by C-G formula based on SCr of 1.03 mg/dL). Liver Function Tests: Recent Labs  Lab 11/20/19 0340 11/21/19 0252 11/22/19 0255 11/23/19 0233 11/24/19 0359  AST 66* 46* 37 45* 71*  ALT 88* 78* 68* 67* 83*  ALKPHOS 85 128* 149* 136* 121  BILITOT 4.1* 2.5* 1.9* 1.8* 2.6*  PROT 6.1* 7.0 7.1 7.1 7.1  ALBUMIN 2.4* 2.5* 2.5* 2.9*  2.9*   Recent Labs  Lab 11/18/19 1138 11/20/19 0340 11/21/19 0252  LIPASE 40 392* 37   No results for input(s): AMMONIA in the last 168 hours. Coagulation Profile: Recent Labs  Lab 11/19/19 0255  INR 1.5*   Cardiac Enzymes: No results for input(s): CKTOTAL, CKMB, CKMBINDEX, TROPONINI in the last 168 hours. BNP (last 3 results) No results for input(s): PROBNP in the last 8760 hours. HbA1C: No results for input(s): HGBA1C in the last 72 hours. CBG: Recent Labs  Lab 11/23/19 1054 11/23/19 1644 11/23/19 2106 11/24/19 0636 11/24/19 1110  GLUCAP 199* 229* 179* 221* 225*   Lipid Profile: No results for input(s): CHOL, HDL, LDLCALC, TRIG, CHOLHDL, LDLDIRECT in the last 72 hours. Thyroid Function Tests: No results for input(s): TSH, T4TOTAL, FREET4, T3FREE, THYROIDAB in the last 72 hours. Anemia Panel: No results for input(s): VITAMINB12, FOLATE, FERRITIN, TIBC, IRON, RETICCTPCT in the last 72 hours. Sepsis Labs: Recent Labs  Lab  11/18/19 1604 11/18/19 1818 11/19/19 0255 11/20/19 0340  PROCALCITON  --   --  7.68 12.04  LATICACIDVEN 3.3* 2.2*  --   --     Recent Results (from the past 240 hour(s))  Blood culture (routine x 2)     Status: None   Collection Time: 11/18/19  4:02 PM   Specimen: BLOOD  Result Value Ref Range Status   Specimen Description BLOOD L HAND  Final   Special Requests   Final    BOTTLES DRAWN AEROBIC AND ANAEROBIC Blood Culture results may not be optimal due to an inadequate volume of blood received in culture bottles   Culture   Final    NO GROWTH 5 DAYS Performed at [facility], [address] [place], [place] [address]    Report Status 11/23/2019 FINAL  Final  Blood culture (routine x 2)     Status: None   Collection Time: 11/18/19  4:04 PM   Specimen: BLOOD  Result Value Ref Range Status   Specimen Description BLOOD R HAND  Final   Special Requests   Final    BOTTLES DRAWN AEROBIC AND ANAEROBIC Blood Culture results may not be optimal due to an inadequate volume of blood received in culture bottles   Culture   Final    NO GROWTH 5 DAYS Performed at [facility], [address] [place], [place] [address]    Report Status 11/23/2019 FINAL  Final  Respiratory Panel by RT PCR (Flu A&B, Covid) - Nasopharyngeal Swab     Status: None   Collection Time: 11/18/19  6:18 PM   Specimen: Nasopharyngeal Swab  Result Value Ref Range Status   SARS Coronavirus 2 by RT PCR NEGATIVE NEGATIVE Final    Comment: (NOTE) SARS-CoV-2 target nucleic acids are NOT DETECTED. The SARS-CoV-2 RNA is generally detectable in upper respiratoy specimens during the acute phase of infection. The lowest concentration of SARS-CoV-2 viral copies this assay can detect is 131 copies/mL. A negative result does not preclude SARS-Cov-2 infection and should not be used as the sole basis for treatment or other patient management decisions. A negative result may occur with  improper specimen  collection/handling, submission of specimen other than nasopharyngeal swab, presence of viral mutation(s) within the areas targeted by this assay, and inadequate number of viral copies (<131 copies/mL). A negative result must be combined with clinical observations, patient history, and epidemiological information. The expected result is Negative. Fact Sheet for Patients:  PinkCheek.be Fact Sheet for Healthcare Providers:  GravelBags.it This test  is not yet ap proved or cleared by the [place] and  has been authorized for detection and/or diagnosis of SARS-CoV-2 by FDA under an Emergency Use Authorization (EUA). This EUA will remain  in effect (meaning this test can be used) for the duration of the COVID-19 declaration under Section 564(b)(1) of the Act, 21 U.S.C. section 360bbb-3(b)(1), unless the authorization is terminated or revoked sooner.    Influenza A by PCR NEGATIVE NEGATIVE Final   Influenza B by PCR NEGATIVE NEGATIVE Final    Comment: (NOTE) The Xpert Xpress SARS-CoV-2/FLU/RSV assay is intended as an aid in  the diagnosis of influenza from Nasopharyngeal swab specimens and  should not be used as a sole basis for treatment. Nasal washings and  aspirates are unacceptable for Xpert Xpress SARS-CoV-2/FLU/RSV  testing. Fact Sheet for Patients: PinkCheek.be Fact Sheet for Healthcare Providers: GravelBags.it This test is not yet approved or cleared by the [place] FDA and  has been authorized for detection and/or diagnosis of SARS-CoV-2 by  FDA under an Emergency Use Authorization (EUA). This EUA will remain  in effect (meaning this test can be used) for the duration of the  Covid-19 declaration under Section 564(b)(1) of the Act, 21  U.S.C. section 360bbb-3(b)(1), unless the authorization is  terminated or revoked. Performed at [facility],  [place], [place] [address]   SARS CORONAVIRUS 2 (TAT 6-24 HRS) Nasopharyngeal Nasopharyngeal Swab     Status: None   Collection Time: 11/23/19 12:29 PM   Specimen: Nasopharyngeal Swab  Result Value Ref Range Status   SARS Coronavirus 2 NEGATIVE NEGATIVE Final    Comment: (NOTE) SARS-CoV-2 target nucleic acids are NOT DETECTED. The SARS-CoV-2 RNA is generally detectable in upper and lower respiratory specimens during the acute phase of infection. Negative results do not preclude SARS-CoV-2 infection, do not rule out co-infections with other pathogens, and should not be used as the sole basis for treatment or other patient management decisions. Negative results must be combined with clinical observations, patient history, and epidemiological information. The expected result is Negative. Fact Sheet for Patients: SugarRoll.be Fact Sheet for Healthcare Providers: https://www.woods-mathews.com/ This test is not yet approved or cleared by the [place] FDA and  has been authorized for detection and/or diagnosis of SARS-CoV-2 by FDA under an Emergency Use Authorization (EUA). This EUA will remain  in effect (meaning this test can be used) for the duration of the COVID-19 declaration under Section 56 4(b)(1) of the Act, 21 U.S.C. section 360bbb-3(b)(1), unless the authorization is terminated or revoked sooner. Performed at [facility], [place] [address], [place], [place] [address]          Radiology Studies: No results found.      Scheduled Meds: . carvedilol  3.125 mg Oral BID WC  . ciprofloxacin  500 mg Oral BID  . donepezil  5 mg Oral Daily  . furosemide  40 mg Oral Daily  . guaiFENesin  600 mg Oral BID  . insulin aspart  0-15 Units Subcutaneous TID WC  . insulin aspart  0-5 Units Subcutaneous QHS  . levothyroxine  112 mcg Oral Q0600  . metroNIDAZOLE  500 mg Oral Q8H  . predniSONE  40 mg Oral Q breakfast   . tamsulosin  0.4 mg Oral QPM   Continuous Infusions:   LOS: 6 days    Time spent: 35 minutes spent on chart review, discussion with nursing staff, consultants, updating family and interview/physical exam; more than 50% of  that time was spent in counseling and/or coordination of care.    Domenic Polite, MD [facility]  11/24/2019, 1:04 PM

## 2019-11-24 NOTE — Progress Notes (Signed)
Physical Therapy Treatment Patient Details Name: David Morgan MRN: [id number] DOB: 03-08-1925 Today's Date: 11/24/2019    History of Present Illness Pt is a 86 yr old male who presented due to [facility] ED due to fall.  PMH: hypertension, hyperlipidemia, type 2 diabetes mellitus, hypothyroidism, BPH and dementia    PT Comments    Pt admitted with above diagnosis. Pt was able to ambulate a few steps to the chair.  Pt needed a little min to mod assist and cues with RW due to signifcant forward lean and some instability.    Pt currently with functional limitations due to balance and endurance deficits. Pt will benefit from skilled PT to increase their independence and safety with mobility to allow discharge to the venue listed below.     Follow Up Recommendations  SNF;Supervision/Assistance - 24 hour     Equipment Recommendations  None recommended by PT(defer to post acute)    Recommendations for Other Services       Precautions / Restrictions Precautions Precautions: Fall Precaution Comments: needs commands/questions written on David Morgan board Restrictions Weight Bearing Restrictions: No    Mobility  Bed Mobility Overal bed mobility: Needs Assistance Bed Mobility: Supine to Sit     Supine to sit: +2 for physical assistance;+2 for safety/equipment;Mod assist;Min assist     General bed mobility comments: Pt needed assist to come to eOB.   Transfers Overall transfer level: Needs assistance Equipment used: Rolling walker (2 wheeled) Transfers: Sit to/from Stand Sit to Stand: +2 physical assistance;Mod assist         General transfer comment: Pt needed mod assisdt to power up from bed and was able to stand pivot to chair with mod assist of 2 and RW for stability, stands with sig trunk flexion, able to correct with tactile cues. Encouraged pt to incr distance and ambulate but pt reports fatigue with just stand and pivot.   Ambulation/Gait                 Stairs              Wheelchair Mobility    Modified Rankin (Stroke Patients Only)       Balance Overall balance assessment: Needs assistance Sitting-balance support: Feet supported;Bilateral upper extremity supported Sitting balance-Leahy Scale: Fair   Postural control: Right lateral lean;Left lateral lean;Posterior lean Standing balance support: Bilateral upper extremity supported Standing balance-Leahy Scale: Poor Standing balance comment: mod assist  of 2 to stand, then minA of 1 to stabilize                            Cognition Arousal/Alertness: Awake/alert Behavior During Therapy: WFL for tasks assessed/performed Overall Cognitive Status: Within Functional Limits for tasks assessed                                 General Comments: pt did better when writting down instructions, generally cooperative, redirectable, agreeable to pace set by PT      Exercises General Exercises - Lower Extremity Ankle Circles/Pumps: AROM;Both;10 reps;Supine Long Arc Quad: AROM;Both;10 reps;Seated    General Comments General comments (skin integrity, edema, etc.): VSS      Pertinent Vitals/Pain Pain Assessment: No/denies pain    Home Living                      Prior Function  PT Goals (current goals can now be found in the care plan section) Acute Rehab PT Goals Patient Stated Goal: none reported Progress towards PT goals: Progressing toward goals    Frequency    Min 2X/week      PT Plan Current plan remains appropriate    Co-evaluation              AM-PAC PT "6 Clicks" Mobility   Outcome Measure  Help needed turning from your back to your side while in a flat bed without using bedrails?: A Lot Help needed moving from lying on your back to sitting on the side of a flat bed without using bedrails?: A Lot Help needed moving to and from a bed to a chair (including a wheelchair)?: A Lot Help needed standing up from a chair  using your arms (e.g., wheelchair or bedside chair)?: A Lot Help needed to walk in hospital room?: A Lot Help needed climbing 3-5 steps with a railing? : Total 6 Click Score: 11    End of Session Equipment Utilized During Treatment: Gait belt Activity Tolerance: Patient limited by fatigue;Patient tolerated treatment well Patient left: with call bell/phone within reach;in chair;with chair alarm set Nurse Communication: Mobility status PT Visit Diagnosis: Other abnormalities of gait and mobility (R26.89);Unsteadiness on feet (R26.81);Muscle weakness (generalized) (M62.81)     Time: [id number] PT Time Calculation (min) (ACUTE ONLY): 15 min  Charges:  $Therapeutic Activity: 8-22 mins                     David Morgan,PT Acute Rehabilitation Services Pager:  [phone]  Office:  [place] 11/24/2019, 1:05 PM

## 2019-11-25 DIAGNOSIS — W19XXXD Unspecified fall, subsequent encounter: Secondary | ICD-10-CM

## 2019-11-25 LAB — CBC
HCT: 50.3 % (ref 39.0–52.0)
Hemoglobin: 15.7 g/dL (ref 13.0–17.0)
MCH: 28.2 pg (ref 26.0–34.0)
MCHC: 31.2 g/dL (ref 30.0–36.0)
MCV: 90.3 fL (ref 80.0–100.0)
Platelets: 193 10*3/uL (ref 150–400)
RBC: 5.57 MIL/uL (ref 4.22–5.81)
RDW: 14.5 % (ref 11.5–15.5)
WBC: 10.5 10*3/uL (ref 4.0–10.5)
nRBC: 0 % (ref 0.0–0.2)

## 2019-11-25 LAB — GLUCOSE, CAPILLARY
Glucose-Capillary: 175 mg/dL — ABNORMAL HIGH (ref 70–99)
Glucose-Capillary: 176 mg/dL — ABNORMAL HIGH (ref 70–99)
Glucose-Capillary: 233 mg/dL — ABNORMAL HIGH (ref 70–99)
Glucose-Capillary: 168 mg/dL — ABNORMAL HIGH (ref 70–99)

## 2019-11-25 LAB — COMPREHENSIVE METABOLIC PANEL
ALT: 83 U/L — ABNORMAL HIGH (ref 0–44)
AST: 53 U/L — ABNORMAL HIGH (ref 15–41)
Albumin: 2.8 g/dL — ABNORMAL LOW (ref 3.5–5.0)
Alkaline Phosphatase: 111 U/L (ref 38–126)
Anion gap: 12 (ref 5–15)
BUN: 30 mg/dL — ABNORMAL HIGH (ref 8–23)
CO2: 29 mmol/L (ref 22–32)
Calcium: 8.4 mg/dL — ABNORMAL LOW (ref 8.9–10.3)
Chloride: 103 mmol/L (ref 98–111)
Creatinine, Ser: 0.94 mg/dL (ref 0.61–1.24)
GFR calc Af Amer: >60 mL/min (ref >60)
GFR calc non Af Amer: >60 mL/min (ref >60)
Glucose, Bld: 173 mg/dL — ABNORMAL HIGH (ref 70–99)
Potassium: 3.8 mmol/L (ref 3.5–5.1)
Sodium: 144 mmol/L (ref 135–145)
Total Bilirubin: 2 mg/dL — ABNORMAL HIGH (ref 0.3–1.2)
Total Protein: 6.5 g/dL (ref 6.5–8.1)

## 2019-11-25 MED ORDER — METRONIDAZOLE 500 MG PO TABS: 500.0000 mg | ORAL_TABLET | Freq: Three times a day (TID) | ORAL | 0 refills | Status: DC

## 2019-11-25 MED ORDER — CIPROFLOXACIN HCL 500 MG PO TABS: 500.0000 mg | ORAL_TABLET | Freq: Two times a day (BID) | ORAL | 0 refills | Status: DC

## 2019-11-25 NOTE — TOC Progression Note (Signed)
Transition of Care [facility]) - Progression Note    Patient Details  Name: KYREN DANEHY MRN: [id number] Date of Birth: [birth year]  Transition of Care [facility]) CM/SW [place], [place] Phone Number: 11/25/2019, 10:05 AM  Clinical Narrative:     CSW spoke with the patient's wife, [name]. CSW informed [employer] denied authorization for SNF placement. She expressed disappointment for denial. She states she has applied for Medicaid. CSW encouraged patient's wife to call [employer]  Concierge to inquire about their Custodial Care benefit. She would like [facility] for her [facility] services.   Patient will need PTAR for transport RNCM updated  Thurmond Butts, MSW, [place] Worker   Expected Discharge Plan: [place]    Expected Discharge Plan and Services Expected Discharge Plan: [place] In-house Referral: Clinical Social Work     Living arrangements for the past 2 months: Single Family Home                                       Social Determinants of Health (SDOH) Interventions    Readmission Risk Interventions No flowsheet data found.

## 2019-11-25 NOTE — Progress Notes (Addendum)
Spoke with Pt's wife, Ms. Denyse Dago by phone. Update given, all questions answered. She expressed appreciations.   She requested MD and social worker to call her for plan of care update tomorrow, stated she preferred her husband to go back home with [facility] instead of SNF.   Pt's vital signs stable, remained afebrile. No immediate distress noted.  Kennyth Lose, RN

## 2019-11-25 NOTE — Progress Notes (Signed)
Physical Therapy Treatment Patient Details Name: David Morgan MRN: [id number] DOB: Dec 02, 1924 Today's Date: 11/25/2019    History of Present Illness Pt is a 86 yr old male who presented due to [facility] ED due to fall.  PMH: hypertension, hyperlipidemia, type 2 diabetes mellitus, hypothyroidism, BPH and dementia    PT Comments    Pt progressing towards goals and pleasant throughout session. Pt requiring min to mod A +2 to stand and transfer to chair this session. Pt continues to fatigue easily and present with weakness and unsteadiness. Feel SNF is most appropriate d/c location, however, per notes, pt has been denied. Will require max HH services at home and DME below to increase safety with mobility and transfers. Reports he has WC at home. Will continue to follow acutely to maximize functional mobility independence and safety.    Follow Up Recommendations  SNF;Supervision/Assistance - 24 hour(max HH services as pt being denied SNF)     Equipment Recommendations  Hospital bed;Other (comment)(hoyer lift and hoyer lift pad)    Recommendations for Other Services       Precautions / Restrictions Precautions Precautions: Fall Precaution Comments: needs commands/questions written on white board Restrictions Weight Bearing Restrictions: No    Mobility  Bed Mobility Overal bed mobility: Needs Assistance Bed Mobility: Supine to Sit     Supine to sit: Mod assist;+2 for physical assistance;+2 for safety/equipment     General bed mobility comments: Mod A +2 for trunk assist and LE assist. Required assist to scoot hips to EOB as well  Transfers Overall transfer level: Needs assistance Equipment used: Rolling walker (2 wheeled) Transfers: Sit to/from [facility] Sit to Stand: Mod assist;+2 physical assistance;Min assist Stand pivot transfers: Min assist;Mod assist;+2 physical assistance       General transfer comment: Mod A +2 to stand initially. Second attempt to  stand, only requiring min A +2. Min to mod A +2 to transfer to chair. Pt with very flexed posture.   Ambulation/Gait                 Stairs             Wheelchair Mobility    Modified Rankin (Stroke Patients Only)       Balance Overall balance assessment: Needs assistance Sitting-balance support: No upper extremity supported;Feet supported Sitting balance-Leahy Scale: Fair     Standing balance support: Bilateral upper extremity supported;During functional activity Standing balance-Leahy Scale: Poor Standing balance comment: Reliant on BUE and external support.                             Cognition Arousal/Alertness: Awake/alert Behavior During Therapy: WFL for tasks assessed/performed Overall Cognitive Status: No family/caregiver present to determine baseline cognitive functioning                                 General Comments: Pt very pleasant throughout session.       Exercises General Exercises - Lower Extremity Ankle Circles/Pumps: AROM;Both;10 reps;Seated    General Comments        Pertinent Vitals/Pain Pain Assessment: Faces Faces Pain Scale: No hurt    Home Living                      Prior Function            PT Goals (current goals can now  be found in the care plan section) Acute Rehab PT Goals Patient Stated Goal: "to keep you ladies here to help me forever"  PT Goal Formulation: Patient unable to participate in goal setting Time For Goal Achievement: 12/03/19 Potential to Achieve Goals: Fair Progress towards PT goals: Progressing toward goals    Frequency    Min 2X/week      PT Plan Equipment recommendations need to be updated    Co-evaluation PT/OT/SLP Co-Evaluation/Treatment: Yes Reason for Co-Treatment: Complexity of the patient's impairments (multi-system involvement);To address functional/ADL transfers PT goals addressed during session: Mobility/safety with  mobility;Balance;Proper use of DME        AM-PAC PT "6 Clicks" Mobility   Outcome Measure  Help needed turning from your back to your side while in a flat bed without using bedrails?: A Lot Help needed moving from lying on your back to sitting on the side of a flat bed without using bedrails?: A Lot Help needed moving to and from a bed to a chair (including a wheelchair)?: A Lot Help needed standing up from a chair using your arms (e.g., wheelchair or bedside chair)?: A Lot Help needed to walk in hospital room?: A Lot Help needed climbing 3-5 steps with a railing? : Total 6 Click Score: 11    End of Session Equipment Utilized During Treatment: Gait belt Activity Tolerance: Patient tolerated treatment well Patient left: in chair;with call bell/phone within reach;with chair alarm set Nurse Communication: Mobility status(need for equipment at home) PT Visit Diagnosis: Other abnormalities of gait and mobility (R26.89);Unsteadiness on feet (R26.81);Muscle weakness (generalized) (M62.81)     Time: [id number] PT Time Calculation (min) (ACUTE ONLY): 26 min  Charges:  $Therapeutic Activity: 8-22 mins                     Lou Miner, DPT  Acute Rehabilitation Services  Pager: [phone] Office: [phone]    Rudean Hitt 11/25/2019, 5:05 PM

## 2019-11-25 NOTE — Progress Notes (Signed)
Occupational Therapy Treatment Patient Details Name: David Morgan MRN: [id number] DOB: Sep 16, 1924 Today's Date: 11/25/2019    History of present illness Pt is a 86 yr old male who presented due to [facility] ED due to fall.  PMH: hypertension, hyperlipidemia, type 2 diabetes mellitus, hypothyroidism, BPH and dementia   OT comments  Pt seen by RN request due to possibility of d/c home. Therapies originally recommended SNF and this request was denied. Pt completed bed mobility at mod A +2 level and sit <> stands with min/mod A +2 and RW. Pt reports dizziness with mobility, VSS. Pt requires increased assist for toileting and peri care due to incontinence. 24/7 physical assist care will be needed at home to assist wife and reduce caregiver burden. Recommend pt has hospital bed at d/c. Continue to recommend SNF as primary option, but pt will need max HH services at d/c. Will continue to follow.   Follow Up Recommendations  SNF;Supervision/Assistance - 24 hour;Other (comment)(Max HH/HHOT services due to denial of SNF)    Equipment Recommendations  3 in 1 bedside commode;Hospital bed;Other (comment)(hoyer lift and lift pads)    Recommendations for Other Services      Precautions / Restrictions Precautions Precautions: Fall Precaution Comments: improved with commands/questions written on white board Restrictions Weight Bearing Restrictions: No       Mobility Bed Mobility Overal bed mobility: Needs Assistance Bed Mobility: Supine to Sit     Supine to sit: Mod assist;+2 for physical assistance;+2 for safety/equipment     General bed mobility comments: Mod A +2 for trunk assist and LE assist. Required assist to scoot hips to EOB as well  Transfers Overall transfer level: Needs assistance Equipment used: Rolling walker (2 wheeled) Transfers: Sit to/from [facility] Sit to Stand: Mod assist;+2 physical assistance;Min assist Stand pivot transfers: Min assist;Mod  assist;+2 physical assistance       General transfer comment: Mod A +2 to stand initially. Second attempt to stand, only requiring min A +2. Min to mod A +2 to transfer to chair. Pt with very flexed posture.     Balance Overall balance assessment: Needs assistance Sitting-balance support: No upper extremity supported;Feet supported Sitting balance-Leahy Scale: Fair     Standing balance support: Bilateral upper extremity supported;During functional activity Standing balance-Leahy Scale: Poor Standing balance comment: Reliant on BUE and external support.                            ADL either performed or assessed with clinical judgement   ADL Overall ADL's : Needs assistance/impaired Eating/Feeding: Set up;Sitting       Upper Body Bathing: Moderate assistance;Sitting Upper Body Bathing Details (indicate cue type and reason): to reach back Lower Body Bathing: Maximal assistance;+2 for physical assistance;+2 for safety/equipment;Cueing for safety;Cueing for sequencing;Sit to/from stand       Lower Body Dressing: Maximal assistance;+2 for physical assistance;+2 for safety/equipment;Cueing for safety;Cueing for sequencing;Sit to/from stand   Toilet Transfer: Maximal assistance;+2 for physical assistance;+2 for safety/equipment;Cueing for safety;Cueing for sequencing;Stand-pivot Toilet Transfer Details (indicate cue type and reason): simulated to recliner Toileting- Clothing Manipulation and Hygiene: Cueing for safety;Cueing for sequencing;Sit to/from stand;Maximal assistance Toileting - Clothing Manipulation Details (indicate cue type and reason): to clean peri area in standing position     Functional mobility during ADLs: Maximal assistance;+2 for physical assistance;+2 for safety/equipment;Cueing for safety;Cueing for sequencing;Rolling walker(stand pivot only)       Vision   Vision Assessment?: No  apparent visual deficits   Perception     Praxis       Cognition Arousal/Alertness: Awake/alert Behavior During Therapy: WFL for tasks assessed/performed Overall Cognitive Status: No family/caregiver present to determine baseline cognitive functioning                                 General Comments: dementia at baseline and dificulty sequencing multistep commands. Ovrall very pleasant        Exercises Exercises: General Lower Extremity General Exercises - Lower Extremity Ankle Circles/Pumps: AROM;Both;10 reps;Seated   Shoulder Instructions       General Comments      Pertinent Vitals/ Pain       Pain Assessment: No/denies pain Faces Pain Scale: No hurt  Home Living                                          Prior Functioning/Environment              Frequency  Min 2X/week        Progress Toward Goals  OT Goals(current goals can now be found in the care plan section)  Progress towards OT goals: Progressing toward goals  Acute Rehab OT Goals Patient Stated Goal: "to keep you ladies here to help me forever"  OT Goal Formulation: With patient Time For Goal Achievement: 12/03/19 Potential to Achieve Goals: Good  Plan Discharge plan needs to be updated    Co-evaluation    PT/OT/SLP Co-Evaluation/Treatment: Yes Reason for Co-Treatment: For patient/therapist safety;To address functional/ADL transfers PT goals addressed during session: Mobility/safety with mobility;Balance;Proper use of DME OT goals addressed during session: ADL's and self-care;Proper use of Adaptive equipment and DME      AM-PAC OT "6 Clicks" Daily Activity     Outcome Measure   Help from another person eating meals?: A Little Help from another person taking care of personal grooming?: A Little Help from another person toileting, which includes using toliet, bedpan, or urinal?: A Lot Help from another person bathing (including washing, rinsing, drying)?: A Lot Help from another person to put on and taking off  regular upper body clothing?: A Lot Help from another person to put on and taking off regular lower body clothing?: A Lot 6 Click Score: 14    End of Session Equipment Utilized During Treatment: Gait belt;Rolling walker  OT Visit Diagnosis: Unsteadiness on feet (R26.81);Other abnormalities of gait and mobility (R26.89);Muscle weakness (generalized) (M62.81);History of falling (Z91.81)   Activity Tolerance Patient tolerated treatment well   Patient Left in chair;with call bell/phone within reach   Nurse Communication Mobility status        Time: [id number] OT Time Calculation (min): 23 min  Charges: OT General Charges $OT Visit: 1 Visit OT Treatments $Self Care/Home Management : 8-22 mins  Zenovia Jarred, MSOT, OTR/L [facility] Office Number: [phone] Pager: [phone]  Zenovia Jarred 11/25/2019, 5:38 PM

## 2019-11-25 NOTE — TOC Progression Note (Addendum)
Transition of Care (TOC) - Progression Note  Marvetta Gibbons RN, BSN Transitions of Care Unit 4E- RN Case Manager [phone]    Patient Details  Name: BLACE LEPRE MRN: [id number] Date of Birth: September 22, 1924  Transition of Care [facility] CM/SW Contact  Dahlia Client, Romeo Rabon, RN Phone Number: 11/25/2019, 4:01 PM  Clinical Narrative:    Pt has been denied SNF stay per insurance, plan to return home with [facility] services- orders placed for HHRN/PT/OT/aide.  Call made to wife [name], confirmed plans, she states she has been in touch with HTA insurance regarding additional assistance with aide services. Also confirmed HH choice of [facility]- which she states pt is active with for [facility] needs PTA will use [facility] as backup. Per wife pt has all needed DME- wife reports pt will need ambulance transport home. Will arrange PTAR- Address confirmed with wife in epic. Pt will need GOLD DNR for transport Choice offered for outpt PC needs- wife states she does not have a preference will check with [facility] first as pt is [facility] ACO, and if they can not accept due to pt's location then will refer to [place].  Call made to Amy with [facility] for [facility] referral- she is checking to see if they can provide all needed services- awaiting return call [facility] - unable to accept due to staffing (per Amy do not find pt active in system) [facility]HH-call made to [facility]- unable to accept- staffing [facility]- call made to [facility]- unable to accept due to staffing- taking HHPT only in pt's area  Will have w/c staff f/u on [facility] referral needs  Call made to [place] with [facility] for outpt PC- however pt is out of their service range for PC. Anderson Malta with [facility] called for outtpt PC referral- referral accepted.   Expected Discharge Plan: Skilled Nursing Facility Barriers to Discharge: Continued Medical Work up  Expected Discharge Plan and Services Expected Discharge Plan: [place] In-house  Referral: Clinical Social Work Discharge Planning Services: CM Consult Post Acute Care Choice: [place] arrangements for the past 2 months: Single Family Home                 DME Arranged: N/A DME Agency: NA       HH Arranged: PT, OT, Nurse's Aide, RN [place] Agency: [facility] Date [place]: 11/25/19 Time [place]: [id number] Representative spoke with at [place]: Amy   Social Determinants of Health ([place]) Interventions    Readmission Risk Interventions No flowsheet data found.

## 2019-11-25 NOTE — Discharge Summary (Addendum)
Physician Discharge Summary  BAM MCCREA [id number] DOB: Dec 05, 1924 DOA: 11/18/2019  PCP: [name], MD  Admit date: 11/18/2019 Discharge date: 11/26/19 Time spent: 35 minutes  Recommendations for Outpatient Follow-up:  1. PCP in 1 week 2. Home health PT OT RN aide 3. Palliative care services at home, may need hospice in the near future   Discharge Diagnoses:  Sepsis Cholangitis Choledocholithiasis Dementia DO NOT RESUSCITATE   Disease of thyroid gland   BP (high blood pressure)   HLD (hyperlipidemia)   BPH (benign prostatic hyperplasia)   Elevated liver enzymes   Dementia without behavioral disturbance ([place])   Accident due to mechanical fall without injury   Hyperglycemia due to type 2 diabetes mellitus (HCC)   Leukocytosis   AKI (acute kidney injury) ([place])   Troponin I above reference range   COPD with acute exacerbation (HCC)   Severe sepsis [facility])   Pulmonary vascular congestion   Choledocholithiasis   Acute diastolic CHF (congestive heart failure) ([place])   Discharge Condition: Stable  Diet recommendation: Heart healthy  Filed Weights   11/23/19 0500 11/24/19 0435 11/25/19 0309  Weight: 106.9 kg 99.3 kg 99 kg    History of present illness:  David Morgan is a 86 y.o.year old malewith medical history significant for emphysematous COPD, HTN, HLD, hypothyroidism who presented on 4/2/2021to [facility] ED with complaints of recurrent falls from home and worsening confusion per wife report. Upon EMS evaluation patient complained of abdominal pain and noted to have worsening breathing. At [facility] ED patient was noted to be tachypneic, BP 164/76, afebrile with leukocytosis, lactic acidosis of 3.3, and AKI, creatinine 1.34, baseline 0.6-0.8, with elevated bilirubin of 4.6.  Underwent CT head and cervical spine given fall which was negative for acute findings. CT abdomen concerning for distended gallbladder with inflammatory changes worrisome for acute  cholecystitis. MRCP . General surgery was consulted in ED who recommended GI consultation. GI recommended MRCP which showed distended gallbladder with stones and abnormal signal of CBD that could be sludge or artifact ( study was difficult). Patient was transferred to [facility] for ERCP. He received Zosyn in the ED  Addendum 11/26/19: Pt declined SNF, will be d/c'd home today with home health.   Hospital Course:   Severe sepsis Choledocholithiasis Suspected cholangitis -Admitted with abdominal pain, abnormal LFTs -Imaging noted choledocholithiasis -Gastroenterology consulted, underwent ERCP with sphincterotomy on 4/3 -Clinically improving since -Was on IV Zosyn, now transitioned to oral Cipro and Flagyl, continue this for 4 more days -General surgery consulted, not felt to be a good candidate for cholelithiasis, felt that risk outweighs benefits -PT OT evaluation completed, SNF was recommended however this was declined by his insurance as he was found to require long-term care as opposed to short-term rehab -Added home health services at discharge, palliative care follow-up  AKI, improvingLikely multifactorial large related to decreased p.o. intake, infectious etiology. Previous baseline 0.6-0.92 (from 2019). Peak of 1.95 now 1.02. S/p lasix for pulmonary edema on CXR. UA bland --Stable now, HCTZ resumed at discharge  Witnessed falls at home Suspect related to worsening confusion on top of dementia related to likely infection above. -imaging negative for acute findings -Home health physical and occupational therapy  Worsening confusion, with baseline dementia without behavioral disturbance.Suspect infectious encephalopathy -CT head neg for acute findings. Alert to selfonly -Delirium precautions -continue aricept  COPD, . On1L but with great O2 saturation of 99%. -Noted to be wheezing few days ago, suspect this was likely from CHF exacerbation, stop steroids and  monitor  Acute diastolic CHF -Hospital course complicated by fluid overload, pulmonary edema and hypoxia -Diuresed with IV Lasix earlier this admission -Transitioned back to HCTZ at discharge  Elevated high-sensitivity troponin, mild, resolvedSuspect demand ischemia in the setting of infectious etiology and severe sepsis. Peak troponin of 30, downtrending to 26. A. fib on EKG with no ischemic changes  Hypertension, stable -Continue home Coreg -Losartan resumed  Hypothyroidism -Continue home Synthroid 112 mcg p.o. daily  Type 2 diabetes, controlled. A1c 6.8% -Holding home metformin -Monitor CBGs, sliding scale as needed  BPH, stable -Monitor output, continue home Flomax  Code Status:  DNR, this was confirmed with patient's wife David Morgan  Consultants:   Gastroenterology   General surgery  Procedures:   ERCP 11/19/2019  Discharge Exam: Vitals:   11/25/19 0903 11/25/19 1133  BP: (!) 165/99 (!) 143/80  Pulse: 86 70  Resp: 20 18  Temp: 97.9 F (36.6 C) (!) 97.5 F (36.4 C)  SpO2: 97% 95%    General: AAOx2, mild confusion Cardiovascular: S1S2/RRR Respiratory: decreased BS at bases  Discharge Instructions    Allergies as of 11/25/2019      Reactions   Adhesive [tape] Rash   Augmentin [amoxicillin-pot Clavulanate] Itching, Rash, Other (See Comments)   Has patient had a PCN reaction causing immediate rash, facial/tongue/throat swelling, SOB or lightheadedness with hypotension: No Has patient had a PCN reaction causing severe rash involving mucus membranes or skin necrosis: No Has patient had a PCN reaction that required hospitalization No Has patient had a PCN reaction occurring within the last 10 years: No If all of the above answers are "NO", then may proceed with Cephalosporin use.      Medication List    STOP taking these medications   acetaminophen 500 MG tablet Commonly known as: TYLENOL   HYDROcodone-Acetaminophen 5-300 MG  Tabs Commonly known as: Vicodin   ondansetron 4 MG tablet Commonly known as: ZOFRAN     TAKE these medications   carvedilol 3.125 MG tablet Commonly known as: COREG Take 3.125 mg by mouth 2 (two) times daily with a meal.   ciprofloxacin 500 MG tablet Commonly known as: CIPRO Take 1 tablet (500 mg total) by mouth 2 (two) times daily.   donepezil 5 MG tablet Commonly known as: ARICEPT Take 5 mg by mouth daily.   hydrochlorothiazide 12.5 MG tablet Commonly known as: HYDRODIURIL Take 12.5 mg by mouth daily.   ipratropium 0.03 % nasal spray Commonly known as: ATROVENT Place 2 sprays into both nostrils 2 (two) times daily.   ketoconazole 2 % cream Commonly known as: NIZORAL Apply 1 application topically daily.   levothyroxine 112 MCG tablet Commonly known as: SYNTHROID Take 112 mcg by mouth daily before breakfast.   losartan 50 MG tablet Commonly known as: COZAAR Take 50 mg by mouth daily.   metFORMIN 500 MG tablet Commonly known as: GLUCOPHAGE Take 500 mg by mouth daily.   metroNIDAZOLE 500 MG tablet Commonly known as: FLAGYL Take 1 tablet (500 mg total) by mouth every 8 (eight) hours.   Multi-Vitamins Tabs Take 1 tablet by mouth daily.   mupirocin ointment 2 % Commonly known as: BACTROBAN Apply 1 application topically 3 (three) times daily.   tamsulosin 0.4 MG Caps capsule Commonly known as: FLOMAX Take 0.4 mg by mouth every evening.      Allergies  Allergen Reactions  . Adhesive [Tape] Rash  . Augmentin [Amoxicillin-Pot Clavulanate] Itching, Rash and Other (See Comments)    Has patient had a PCN  reaction causing immediate rash, facial/tongue/throat swelling, SOB or lightheadedness with hypotension: No Has patient had a PCN reaction causing severe rash involving mucus membranes or skin necrosis: No Has patient had a PCN reaction that required hospitalization No Has patient had a PCN reaction occurring within the last 10 years: No If all of the above  answers are "NO", then may proceed with Cephalosporin use.    Follow-up Information    [name], MD Follow up in 1 week(s).   Specialty: Internal Medicine Contact information: [address] [place] [address] [phone]            The results of significant diagnostics from this hospitalization (including imaging, microbiology, ancillary and laboratory) are listed below for reference.    Significant Diagnostic Studies: CT Head Wo Contrast  Result Date: 11/18/2019 CLINICAL DATA:  [place] at home yesterday. [place] again this morning. Confusion. EXAM: CT HEAD WITHOUT CONTRAST CT CERVICAL SPINE WITHOUT CONTRAST TECHNIQUE: Multidetector CT imaging of the head and cervical spine was performed following the standard protocol without intravenous contrast. Multiplanar CT image reconstructions of the cervical spine were also generated. COMPARISON:  Head CT 12/21/2017 FINDINGS: CT HEAD FINDINGS Brain: Stable age related significant cerebral atrophy, ventriculomegaly and periventricular white matter disease. No extra-axial fluid collections are identified. No CT findings for acute hemispheric infarction or intracranial hemorrhage. No mass lesions. The brainstem and cerebellum are normal. Vascular: Stable vascular calcifications. No hyperdense vessels or obvious aneurysm. Skull: No skull fracture or bone lesions. Sinuses/Orbits: The paranasal sinuses and mastoid air cells are clear. The globes are intact. Other: No scalp lesions or obvious laceration or foreign body. CT CERVICAL SPINE FINDINGS Alignment: Normal overall alignment. Mild degenerative anterior subluxation of C7 compared to T1. The facet joints are fused at this disc space level. Skull base and vertebrae: No acute fracture. No primary bone lesion or focal pathologic process. Soft tissues and spinal canal: No prevertebral fluid or swelling. No visible canal hematoma. Disc levels: The spinal canal is fairly  generous. No significant spinal stenosis. Multilevel facet disease and mild uncinate spurring changes contributing to mild multilevel bony foraminal narrowing. Upper chest: The lung apices are grossly clear. Other: No neck mass or adenopathy. Carotid artery calcifications are noted. IMPRESSION: 1. Stable age related cerebral atrophy, ventriculomegaly and periventricular white matter disease. 2. No acute intracranial findings or skull fracture. 3. Degenerative cervical spondylosis with multilevel disc disease, facet disease and uncinate spurring changes but no acute cervical spine fracture. Electronically Signed   By: Marijo Sanes M.D.   On: 11/18/2019 13:19   CT Angio Chest PE W and/or Wo Contrast  Result Date: 11/18/2019 CLINICAL DATA:  86 year old male with abdominal trauma EXAM: CT ANGIOGRAPHY CHEST CT ABDOMEN AND PELVIS WITH CONTRAST TECHNIQUE: Multidetector CT imaging of the chest was performed using the standard protocol during bolus administration of intravenous contrast. Multiplanar CT image reconstructions and MIPs were obtained to evaluate the vascular anatomy. Multidetector CT imaging of the abdomen and pelvis was performed using the standard protocol during bolus administration of intravenous contrast. CONTRAST:  32mL OMNIPAQUE IOHEXOL 350 MG/ML SOLN COMPARISON:  09/18/2015 FINDINGS: CTA CHEST FINDINGS Cardiovascular: Heart: No cardiomegaly. No pericardial fluid/thickening. Calcifications of left main, left anterior descending, circumflex, right coronary arteries. Aorta: Calcifications of the aortic valve. Unremarkable course caliber and contour of the thoracic aorta. Moderate atherosclerosis of the aorta including some irregular intimal plaque of the sending thoracic aorta. No ulcerated plaque or evidence of dissection. Pulmonary arteries: No central,  lobar, segmental, or proximal subsegmental filling defects. Mediastinum/Nodes: Multiple small mediastinal lymph nodes throughout all nodal stations.  Unremarkable appearance of the thoracic esophagus. Unremarkable thoracic inlet Lungs/Pleura: Centrilobular and paraseptal emphysema. Respiratory motion limits evaluation for small nodules. No pneumothorax or pleural effusion. No confluent airspace disease. Review of the MIP images confirms the above findings. CT ABDOMEN and PELVIS FINDINGS Hepatobiliary: Unremarkable liver. Gallbladder is distended with radiopaque stones in the neck of the gallbladder. No significant pericholecystic fluid. Mild inflammatory changes of the gallbladder wall and the adjacent fat. No definite radiopaque stones identified in the region of the common bile duct. Pancreas: Unremarkable Spleen: Unremarkable Adrenals/Urinary Tract: - Right adrenal gland:  Unremarkable - Left adrenal gland: Unremarkable. - Right kidney: No hydronephrosis, nephrolithiasis, inflammation, or ureteral dilation. Hypodense lesion on the posterior cortex of the right kidney is too small to characterize. Hyperdense focal lesion on the lateral cortex of the right kidney was present on the comparison CT and most likely represents a complex cyst given the decreasing size though is strictly too small to characterize. - Left Kidney: No hydronephrosis, nephrolithiasis, inflammation, or ureteral dilation. No focal lesion. - Urinary Bladder: Urinary bladder relatively decompressed. Stomach/Bowel: - Stomach: Small hiatal hernia.  Otherwise unremarkable stomach. - Small bowel: Unremarkable - Appendix: Normal. - Colon: Colonic diverticula of the sigmoid colon and the left colon. No focal inflammatory changes. No bowel obstruction. Mild stool burden. Vascular/Lymphatic: Atherosclerosis of the abdominal aorta. No periaortic fluid or inflammatory changes. Greatest diameter of the aorta is estimated 3.4 cm. Irregular plaque versus chronic dissection just above the bifurcation. Atherosclerotic changes of the bilateral iliac arteries. Bilateral iliac arteries are patent. Proximal  femoral arteries are patent. Reproductive: Calcifications of the prostate. Other: Fat containing umbilical hernia. Musculoskeletal: Osteopenia. Degenerative changes of the visualized spine no acute displaced vertebral body fracture. Kyphotic deformity of the thoracic spine likely degenerative. No bony canal narrowing.  No aggressive bony lesions identified. Treatment changes of prior vertebral augmentation of L3. Surgical changes of prior left femur repair. Review of the MIP images confirms the above findings. IMPRESSION: No acute finding of the chest CT angiogram. Cholelithiasis with distended gallbladder and vague inflammatory changes of the gallbladder wall. These findings may reflect early acute cholecystitis. Correlation with lab values and patient presentation may be useful, as well as consideration of confirmatory HIDA study. Ultrasound will likely yield no further useful imaging information. Emphysema (ICD10-J43.9). Aortic Atherosclerosis (ICD10-I70.0). Associated coronary artery disease, bilateral iliac arterial disease, irregular plaque of the abdominal aorta. Diverticular disease without evidence of acute diverticulitis. Additional ancillary findings as above. Electronically Signed   By: Corrie Mckusick D.O.   On: 11/18/2019 13:26   CT Cervical Spine Wo Contrast  Result Date: 11/18/2019 CLINICAL DATA:  [place] at home yesterday. [place] again this morning. Confusion. EXAM: CT HEAD WITHOUT CONTRAST CT CERVICAL SPINE WITHOUT CONTRAST TECHNIQUE: Multidetector CT imaging of the head and cervical spine was performed following the standard protocol without intravenous contrast. Multiplanar CT image reconstructions of the cervical spine were also generated. COMPARISON:  Head CT 12/21/2017 FINDINGS: CT HEAD FINDINGS Brain: Stable age related significant cerebral atrophy, ventriculomegaly and periventricular white matter disease. No extra-axial fluid collections are identified. No CT findings for acute hemispheric  infarction or intracranial hemorrhage. No mass lesions. The brainstem and cerebellum are normal. Vascular: Stable vascular calcifications. No hyperdense vessels or obvious aneurysm. Skull: No skull fracture or bone lesions. Sinuses/Orbits: The paranasal sinuses and mastoid air cells are clear. The globes are intact. Other: No scalp  lesions or obvious laceration or foreign body. CT CERVICAL SPINE FINDINGS Alignment: Normal overall alignment. Mild degenerative anterior subluxation of C7 compared to T1. The facet joints are fused at this disc space level. Skull base and vertebrae: No acute fracture. No primary bone lesion or focal pathologic process. Soft tissues and spinal canal: No prevertebral fluid or swelling. No visible canal hematoma. Disc levels: The spinal canal is fairly generous. No significant spinal stenosis. Multilevel facet disease and mild uncinate spurring changes contributing to mild multilevel bony foraminal narrowing. Upper chest: The lung apices are grossly clear. Other: No neck mass or adenopathy. Carotid artery calcifications are noted. IMPRESSION: 1. Stable age related cerebral atrophy, ventriculomegaly and periventricular white matter disease. 2. No acute intracranial findings or skull fracture. 3. Degenerative cervical spondylosis with multilevel disc disease, facet disease and uncinate spurring changes but no acute cervical spine fracture. Electronically Signed   By: Marijo Sanes M.D.   On: 11/18/2019 13:19   CT ABDOMEN PELVIS W CONTRAST  Result Date: 11/18/2019 CLINICAL DATA:  86 year old male with abdominal trauma EXAM: CT ANGIOGRAPHY CHEST CT ABDOMEN AND PELVIS WITH CONTRAST TECHNIQUE: Multidetector CT imaging of the chest was performed using the standard protocol during bolus administration of intravenous contrast. Multiplanar CT image reconstructions and MIPs were obtained to evaluate the vascular anatomy. Multidetector CT imaging of the abdomen and pelvis was performed using the  standard protocol during bolus administration of intravenous contrast. CONTRAST:  68mL OMNIPAQUE IOHEXOL 350 MG/ML SOLN COMPARISON:  09/18/2015 FINDINGS: CTA CHEST FINDINGS Cardiovascular: Heart: No cardiomegaly. No pericardial fluid/thickening. Calcifications of left main, left anterior descending, circumflex, right coronary arteries. Aorta: Calcifications of the aortic valve. Unremarkable course caliber and contour of the thoracic aorta. Moderate atherosclerosis of the aorta including some irregular intimal plaque of the sending thoracic aorta. No ulcerated plaque or evidence of dissection. Pulmonary arteries: No central, lobar, segmental, or proximal subsegmental filling defects. Mediastinum/Nodes: Multiple small mediastinal lymph nodes throughout all nodal stations. Unremarkable appearance of the thoracic esophagus. Unremarkable thoracic inlet Lungs/Pleura: Centrilobular and paraseptal emphysema. Respiratory motion limits evaluation for small nodules. No pneumothorax or pleural effusion. No confluent airspace disease. Review of the MIP images confirms the above findings. CT ABDOMEN and PELVIS FINDINGS Hepatobiliary: Unremarkable liver. Gallbladder is distended with radiopaque stones in the neck of the gallbladder. No significant pericholecystic fluid. Mild inflammatory changes of the gallbladder wall and the adjacent fat. No definite radiopaque stones identified in the region of the common bile duct. Pancreas: Unremarkable Spleen: Unremarkable Adrenals/Urinary Tract: - Right adrenal gland:  Unremarkable - Left adrenal gland: Unremarkable. - Right kidney: No hydronephrosis, nephrolithiasis, inflammation, or ureteral dilation. Hypodense lesion on the posterior cortex of the right kidney is too small to characterize. Hyperdense focal lesion on the lateral cortex of the right kidney was present on the comparison CT and most likely represents a complex cyst given the decreasing size though is strictly too small to  characterize. - Left Kidney: No hydronephrosis, nephrolithiasis, inflammation, or ureteral dilation. No focal lesion. - Urinary Bladder: Urinary bladder relatively decompressed. Stomach/Bowel: - Stomach: Small hiatal hernia.  Otherwise unremarkable stomach. - Small bowel: Unremarkable - Appendix: Normal. - Colon: Colonic diverticula of the sigmoid colon and the left colon. No focal inflammatory changes. No bowel obstruction. Mild stool burden. Vascular/Lymphatic: Atherosclerosis of the abdominal aorta. No periaortic fluid or inflammatory changes. Greatest diameter of the aorta is estimated 3.4 cm. Irregular plaque versus chronic dissection just above the bifurcation. Atherosclerotic changes of the bilateral iliac arteries. Bilateral iliac  arteries are patent. Proximal femoral arteries are patent. Reproductive: Calcifications of the prostate. Other: Fat containing umbilical hernia. Musculoskeletal: Osteopenia. Degenerative changes of the visualized spine no acute displaced vertebral body fracture. Kyphotic deformity of the thoracic spine likely degenerative. No bony canal narrowing.  No aggressive bony lesions identified. Treatment changes of prior vertebral augmentation of L3. Surgical changes of prior left femur repair. Review of the MIP images confirms the above findings. IMPRESSION: No acute finding of the chest CT angiogram. Cholelithiasis with distended gallbladder and vague inflammatory changes of the gallbladder wall. These findings may reflect early acute cholecystitis. Correlation with lab values and patient presentation may be useful, as well as consideration of confirmatory HIDA study. Ultrasound will likely yield no further useful imaging information. Emphysema (ICD10-J43.9). Aortic Atherosclerosis (ICD10-I70.0). Associated coronary artery disease, bilateral iliac arterial disease, irregular plaque of the abdominal aorta. Diverticular disease without evidence of acute diverticulitis. Additional ancillary  findings as above. Electronically Signed   By: Corrie Mckusick D.O.   On: 11/18/2019 13:26   MR 3D Recon At Scanner  Result Date: 11/18/2019 CLINICAL DATA:  Cholelithiasis with elevated bilirubin. EXAM: MRI ABDOMEN WITHOUT AND WITH CONTRAST (INCLUDING MRCP) TECHNIQUE: Multiplanar multisequence MR imaging of the abdomen was performed both before and after the administration of intravenous contrast. Heavily T2-weighted images of the biliary and pancreatic ducts were obtained, and three-dimensional MRCP images were rendered by post processing. CONTRAST:  83mL GADAVIST GADOBUTROL 1 MMOL/ML IV SOLN COMPARISON:  CT scan 11/18/2019 FINDINGS: Motion degraded study due to patient inability to reproducibly breath hold. Lower chest: Unremarkable. Hepatobiliary: 8 mm cyst noted inferior right liver. Gallbladder is distended with layering tiny gallstones. Common duct measures 7 mm diameter. There is abnormal signal in the distal common bile duct. This is not well evaluated due to motion artifact and could represent sludge impacted in the distal common bile duct although tiny layering/stacked gallstones cannot be excluded. Pancreas: Limited assessment due to motion degradation on postcontrast imaging. No focal mass lesion. No dilatation of the main duct. No intraparenchymal cyst. No peripancreatic edema. Spleen:  No gross abnormality. Adrenals/Urinary Tract: No adrenal nodule or mass. Bilateral renal cysts. Exophytic 10 mm complex cystic lesion in the interpolar right kidney (image 27/3 and image 330/18). This is not well evaluated due to motion degradation but comparing today's CT to a CT scan from 09/18/2015, this lesion has decreased in size in the interval consistent with benign etiology. Stomach/Bowel: Stomach is moderately distended. No small bowel or colonic dilatation within the visualized abdomen. Vascular/Lymphatic: Atherosclerotic changes noted in the abdominal aorta. No abdominal lymphadenopathy. Other:  No  intraperitoneal free fluid. Musculoskeletal: No suspicious marrow enhancement within the visualized bony anatomy. IMPRESSION: 1. Motion degraded study due to patient inability to reproducibly breath hold. 2. There is abnormal signal in the 7 mm diameter distal common bile duct with loss of normal T2 hyperintensity. This is not well evaluated due to motion artifact and could represent sludge or stacked gallstones. No suspicious enhancement to suggest soft tissue mass, but follow-up likely warranted. 3. Cholelithiasis. 4. Right renal cysts. Electronically Signed   By: Misty Stanley M.D.   On: 11/18/2019 16:04   DG CHEST PORT 1 VIEW  Result Date: 11/20/2019 CLINICAL DATA:  COPD, possible CHF. Mild pulmonary edema or might chest x-ray. Evaluate for change. EXAM: PORTABLE CHEST 1 VIEW COMPARISON:  Chest x-rays dated 11/19/2019 and 09/05/2015. FINDINGS: Continued central pulmonary vascular congestion and mild bilateral interstitial edema. Probable mild bibasilar atelectasis and/or small pleural effusions.  Heart size and mediastinal contours are stable in size and configuration. No pneumothorax is seen. Osseous structures about the chest are unremarkable. IMPRESSION: 1. No interval change. Persistent central pulmonary vascular congestion and mild bilateral interstitial edema suggesting mild volume overload/CHF. 2. Probable mild bibasilar atelectasis and/or small pleural effusions. Electronically Signed   By: Franki Cabot M.D.   On: 11/20/2019 11:52   DG CHEST PORT 1 VIEW  Result Date: 11/19/2019 CLINICAL DATA:  86 year old male with shortness of breath EXAM: PORTABLE CHEST 1 VIEW COMPARISON:  Prior chest x-ray 09/05/2015 FINDINGS: Very low inspiratory volumes. Pulmonary vascular congestion is present bordering on mild edema. Nonspecific patchy bibasilar airspace opacities. Cardiac and mediastinal contours are within normal limits. No pneumothorax. No acute osseous abnormality. IMPRESSION: 1. Pulmonary vascular  congestion bordering on mild edema. 2. Very low inspiratory volumes. 3. Nonspecific bibasilar patchy airspace opacities which could reflect atelectasis, dependent edema or infiltrate. Electronically Signed   By: Jacqulynn Cadet M.D.   On: 11/19/2019 16:18   DG ERCP  Result Date: 11/19/2019 CLINICAL DATA:  86 year old male with choledocholithiasis EXAM: ERCP TECHNIQUE: Multiple spot images obtained with the fluoroscopic device and submitted for interpretation post-procedure. FLUOROSCOPY TIME:  Fluoroscopy Time:  12 minutes 57 seconds COMPARISON:  MRCP 11/18/2019 FINDINGS: A total of 4 intraoperative saved images are submitted for review. The images demonstrate a flexible endoscope in the descending duodenum with wire cannulation of the common bile duct. Retrograde cholangiogram was then performed. The images demonstrate multiple small filling defects in the common bile duct consistent with reported history of choledocholithiasis. The cystic duct is patent. Opacification of the gallbladder demonstrates that it is filled with numerous filling defects consistent with small stones. No significant biliary ductal dilatation. On the final image, the common bile duct appears clear and a small amount of contrast is seen within the duodenum. IMPRESSION: 1. Cholelithiasis. 2. Choledocholithiasis. These images were submitted for radiologic interpretation only. Please see the procedural report for the amount of contrast and the fluoroscopy time utilized. Electronically Signed   By: Jacqulynn Cadet M.D.   On: 11/19/2019 15:43   MR ABDOMEN MRCP W WO CONTAST  Result Date: 11/18/2019 CLINICAL DATA:  Cholelithiasis with elevated bilirubin. EXAM: MRI ABDOMEN WITHOUT AND WITH CONTRAST (INCLUDING MRCP) TECHNIQUE: Multiplanar multisequence MR imaging of the abdomen was performed both before and after the administration of intravenous contrast. Heavily T2-weighted images of the biliary and pancreatic ducts were obtained, and  three-dimensional MRCP images were rendered by post processing. CONTRAST:  55mL GADAVIST GADOBUTROL 1 MMOL/ML IV SOLN COMPARISON:  CT scan 11/18/2019 FINDINGS: Motion degraded study due to patient inability to reproducibly breath hold. Lower chest: Unremarkable. Hepatobiliary: 8 mm cyst noted inferior right liver. Gallbladder is distended with layering tiny gallstones. Common duct measures 7 mm diameter. There is abnormal signal in the distal common bile duct. This is not well evaluated due to motion artifact and could represent sludge impacted in the distal common bile duct although tiny layering/stacked gallstones cannot be excluded. Pancreas: Limited assessment due to motion degradation on postcontrast imaging. No focal mass lesion. No dilatation of the main duct. No intraparenchymal cyst. No peripancreatic edema. Spleen:  No gross abnormality. Adrenals/Urinary Tract: No adrenal nodule or mass. Bilateral renal cysts. Exophytic 10 mm complex cystic lesion in the interpolar right kidney (image 27/3 and image 330/18). This is not well evaluated due to motion degradation but comparing today's CT to a CT scan from 09/18/2015, this lesion has decreased in size in the interval consistent  with benign etiology. Stomach/Bowel: Stomach is moderately distended. No small bowel or colonic dilatation within the visualized abdomen. Vascular/Lymphatic: Atherosclerotic changes noted in the abdominal aorta. No abdominal lymphadenopathy. Other:  No intraperitoneal free fluid. Musculoskeletal: No suspicious marrow enhancement within the visualized bony anatomy. IMPRESSION: 1. Motion degraded study due to patient inability to reproducibly breath hold. 2. There is abnormal signal in the 7 mm diameter distal common bile duct with loss of normal T2 hyperintensity. This is not well evaluated due to motion artifact and could represent sludge or stacked gallstones. No suspicious enhancement to suggest soft tissue mass, but follow-up likely  warranted. 3. Cholelithiasis. 4. Right renal cysts. Electronically Signed   By: Misty Stanley M.D.   On: 11/18/2019 16:04   ECHOCARDIOGRAM COMPLETE  Result Date: 11/20/2019    ECHOCARDIOGRAM REPORT   Patient Name:   David Morgan Date of Exam: 11/20/2019 Medical Rec #:  [id number]          Height:       69.0 in Accession #:    [id number]         Weight:       231.0 lb Date of Birth:  [birth year]         BSA:          2.197 m Patient Age:    43 years           BP:           109/57 mmHg Patient Gender: M                  HR:           70 bpm. Exam Location:  Inpatient Procedure: 2D Echo, Cardiac Doppler and Color Doppler Indications:    Pulmonary Vascular Congestion [id number]  History:        Patient has no prior history of Echocardiogram examinations.                 TIA, PAD and COPD, Signs/Symptoms:Dyspnea; Risk                 Factors:Hypertension and Dyslipidemia. Cancer.  Sonographer:    Tiffany Dance Referring Phys: [id number] [place]  1. Left ventricular ejection fraction, by estimation, is 55 to 60%. The left ventricle has normal function. The left ventricle has no regional wall motion abnormalities. There is mild left ventricular hypertrophy. Left ventricular diastolic parameters are consistent with Grade I diastolic dysfunction (impaired relaxation). Elevated left ventricular end-diastolic pressure.  2. Right ventricular systolic function is mildly reduced. The right ventricular size is normal. There is moderately elevated pulmonary artery systolic pressure. The estimated right ventricular systolic pressure is 123456 mmHg.  3. Left atrial size was mildly dilated.  4. The mitral valve is abnormal. Mild mitral valve regurgitation.  5. The aortic valve is tricuspid. Aortic valve regurgitation is not visualized. Moderate aortic valve stenosis. Aortic valve area, by VTI measures 1.44 cm. Aortic valve mean gradient measures 17.3 mmHg. Aortic valve Vmax measures 2.62 m/s.  6. The inferior vena  cava is dilated in size with <50% respiratory variability, suggesting right atrial pressure of 15 mmHg. FINDINGS  Left Ventricle: Left ventricular ejection fraction, by estimation, is 55 to 60%. The left ventricle has normal function. The left ventricle has no regional wall motion abnormalities. The left ventricular internal cavity size was normal in size. There is  mild left ventricular hypertrophy. Left ventricular diastolic parameters are consistent with Grade I diastolic dysfunction (impaired  relaxation). Elevated left ventricular end-diastolic pressure. Right Ventricle: The right ventricular size is normal. No increase in right ventricular wall thickness. Right ventricular systolic function is mildly reduced. There is moderately elevated pulmonary artery systolic pressure. The tricuspid regurgitant velocity is 2.93 m/s, and with an assumed right atrial pressure of 15 mmHg, the estimated right ventricular systolic pressure is 123456 mmHg. Left Atrium: Left atrial size was mildly dilated. Right Atrium: Right atrial size was normal in size. Pericardium: There is no evidence of pericardial effusion. Mitral Valve: The mitral valve is abnormal. There is mild thickening of the mitral valve leaflet(s). There is moderate calcification of the mitral valve leaflet(s). Mild mitral valve regurgitation. Tricuspid Valve: The tricuspid valve is grossly normal. Tricuspid valve regurgitation is mild. Aortic Valve: The aortic valve is tricuspid. . There is moderate thickening and moderate calcification of the aortic valve. Aortic valve regurgitation is not visualized. Moderate aortic stenosis is present. Mild aortic valve annular calcification. There is moderate thickening of the aortic valve. There is moderate calcification of the aortic valve. Aortic valve mean gradient measures 17.3 mmHg. Aortic valve peak gradient measures 27.5 mmHg. Aortic valve area, by VTI measures 1.44 cm. Pulmonic Valve: The pulmonic valve was grossly  normal. Pulmonic valve regurgitation is trivial. Aorta: The aortic root and ascending aorta are structurally normal, with no evidence of dilitation. Venous: The inferior vena cava is dilated in size with less than 50% respiratory variability, suggesting right atrial pressure of 15 mmHg. IAS/Shunts: No atrial level shunt detected by color flow Doppler.  LEFT VENTRICLE PLAX 2D LVIDd:         4.30 cm  Diastology LVIDs:         3.92 cm  LV e' lateral:   7.72 cm/s LV PW:         1.48 cm  LV E/e' lateral: 13.9 LV IVS:        1.05 cm  LV e' medial:    5.33 cm/s LVOT diam:     1.90 cm  LV E/e' medial:  20.1 LV SV:         97 LV SV Index:   44 LVOT Area:     2.84 cm  RIGHT VENTRICLE            IVC RV Basal diam:  4.08 cm    IVC diam: 2.13 cm RV Mid diam:    2.78 cm RV S prime:     9.25 cm/s TAPSE (M-mode): 1.7 cm LEFT ATRIUM              Index       RIGHT ATRIUM           Index LA diam:        5.40 cm  2.46 cm/m  RA Area:     20.30 cm LA Vol (A2C):   115.0 ml 52.35 ml/m RA Volume:   47.20 ml  21.49 ml/m LA Vol (A4C):   55.2 ml  25.13 ml/m LA Biplane Vol: 81.1 ml  36.92 ml/m  AORTIC VALVE AV Area (Vmax):    1.46 cm AV Area (Vmean):   1.46 cm AV Area (VTI):     1.44 cm AV Vmax:           262.33 cm/s AV Vmean:          194.333 cm/s AV VTI:            0.674 m AV Peak Grad:      27.5 mmHg AV Mean  Grad:      17.3 mmHg LVOT Vmax:         134.67 cm/s LVOT Vmean:        100.300 cm/s LVOT VTI:          0.343 m LVOT/AV VTI ratio: 0.51  AORTA Ao Root diam: 3.90 cm Ao Asc diam:  3.60 cm MITRAL VALVE                TRICUSPID VALVE MV Area (PHT): 3.77 cm     TR Peak grad:   34.3 mmHg MV Decel Time: 201 msec     TR Vmax:        293.00 cm/s MV E velocity: 107.00 cm/s MV A velocity: 101.00 cm/s  SHUNTS MV E/A ratio:  1.06         Systemic VTI:  0.34 m                             Systemic Diam: 1.90 cm [name] Bishop MD Electronically signed by [name] Bishop MD Signature Date/Time: 11/20/2019/5:05:08 PM    Final      Microbiology: Recent Results (from the past 240 hour(s))  Blood culture (routine x 2)     Status: None   Collection Time: 11/18/19  4:02 PM   Specimen: BLOOD  Result Value Ref Range Status   Specimen Description BLOOD L HAND  Final   Special Requests   Final    BOTTLES DRAWN AEROBIC AND ANAEROBIC Blood Culture results may not be optimal due to an inadequate volume of blood received in culture bottles   Culture   Final    NO GROWTH 5 DAYS Performed at [facility], [address] [place], [place] [address]    Report Status 11/23/2019 FINAL  Final  Blood culture (routine x 2)     Status: None   Collection Time: 11/18/19  4:04 PM   Specimen: BLOOD  Result Value Ref Range Status   Specimen Description BLOOD R HAND  Final   Special Requests   Final    BOTTLES DRAWN AEROBIC AND ANAEROBIC Blood Culture results may not be optimal due to an inadequate volume of blood received in culture bottles   Culture   Final    NO GROWTH 5 DAYS Performed at [facility], [address] [place], [place] [address]    Report Status 11/23/2019 FINAL  Final  Respiratory Panel by RT PCR (Flu A&B, Covid) - Nasopharyngeal Swab     Status: None   Collection Time: 11/18/19  6:18 PM   Specimen: Nasopharyngeal Swab  Result Value Ref Range Status   SARS Coronavirus 2 by RT PCR NEGATIVE NEGATIVE Final    Comment: (NOTE) SARS-CoV-2 target nucleic acids are NOT DETECTED. The SARS-CoV-2 RNA is generally detectable in upper respiratoy specimens during the acute phase of infection. The lowest concentration of SARS-CoV-2 viral copies this assay can detect is 131 copies/mL. A negative result does not preclude SARS-Cov-2 infection and should not be used as the sole basis for treatment or other patient management decisions. A negative result may occur with  improper specimen collection/handling, submission of specimen other than nasopharyngeal swab, presence of viral mutation(s) within  the areas targeted by this assay, and inadequate number of viral copies (<131 copies/mL). A negative result must be combined with clinical observations, patient history, and epidemiological information. The expected result is Negative. Fact Sheet for Patients:  PinkCheek.be Fact Sheet for Healthcare  Providers:  GravelBags.it This test is not yet ap proved or cleared by the [place] and  has been authorized for detection and/or diagnosis of SARS-CoV-2 by FDA under an Emergency Use Authorization (EUA). This EUA will remain  in effect (meaning this test can be used) for the duration of the COVID-19 declaration under Section 564(b)(1) of the Act, 21 U.S.C. section 360bbb-3(b)(1), unless the authorization is terminated or revoked sooner.    Influenza A by PCR NEGATIVE NEGATIVE Final   Influenza B by PCR NEGATIVE NEGATIVE Final    Comment: (NOTE) The Xpert Xpress SARS-CoV-2/FLU/RSV assay is intended as an aid in  the diagnosis of influenza from Nasopharyngeal swab specimens and  should not be used as a sole basis for treatment. Nasal washings and  aspirates are unacceptable for Xpert Xpress SARS-CoV-2/FLU/RSV  testing. Fact Sheet for Patients: PinkCheek.be Fact Sheet for Healthcare Providers: GravelBags.it This test is not yet approved or cleared by the [place] FDA and  has been authorized for detection and/or diagnosis of SARS-CoV-2 by  FDA under an Emergency Use Authorization (EUA). This EUA will remain  in effect (meaning this test can be used) for the duration of the  Covid-19 declaration under Section 564(b)(1) of the Act, 21  U.S.C. section 360bbb-3(b)(1), unless the authorization is  terminated or revoked. Performed at [facility], [place], [place] [address]   SARS CORONAVIRUS 2 (TAT 6-24 HRS) Nasopharyngeal Nasopharyngeal  Swab     Status: None   Collection Time: 11/23/19 12:29 PM   Specimen: Nasopharyngeal Swab  Result Value Ref Range Status   SARS Coronavirus 2 NEGATIVE NEGATIVE Final    Comment: (NOTE) SARS-CoV-2 target nucleic acids are NOT DETECTED. The SARS-CoV-2 RNA is generally detectable in upper and lower respiratory specimens during the acute phase of infection. Negative results do not preclude SARS-CoV-2 infection, do not rule out co-infections with other pathogens, and should not be used as the sole basis for treatment or other patient management decisions. Negative results must be combined with clinical observations, patient history, and epidemiological information. The expected result is Negative. Fact Sheet for Patients: SugarRoll.be Fact Sheet for Healthcare Providers: https://www.woods-mathews.com/ This test is not yet approved or cleared by the [place] FDA and  has been authorized for detection and/or diagnosis of SARS-CoV-2 by FDA under an Emergency Use Authorization (EUA). This EUA will remain  in effect (meaning this test can be used) for the duration of the COVID-19 declaration under Section 56 4(b)(1) of the Act, 21 U.S.C. section 360bbb-3(b)(1), unless the authorization is terminated or revoked sooner. Performed at [facility], [place] [address] [place], [place] [address]      Labs: Basic Metabolic Panel: Recent Labs  Lab 11/20/19 0340 11/20/19 [id number] 11/21/19 0252 11/22/19 0255 11/23/19 0233 11/24/19 0359 11/25/19 0258  NA   < >  --  140 141 146* 145 144  K   < >  --  3.8 3.2* 3.6 3.9 3.8  CL   < >  --  104 105 107 103 103  CO2   < >  --  23 25 24 31 29   GLUCOSE   < >  --  234* 234* 172* 223* 173*  BUN   < >  --  47* 40* 39* 34* 30*  CREATININE   < >  --  1.49* 1.13 1.02 1.03 0.94  CALCIUM   < >  --  8.1* 8.3* 8.6* 8.5* 8.4*  MG  --  2.0  --  2.3  --  2.3  --    < > = values in this interval not displayed.    Liver Function Tests: Recent Labs  Lab 11/21/19 0252 11/22/19 0255 11/23/19 0233 11/24/19 0359 11/25/19 0258  AST 46* 37 45* 71* 53*  ALT 78* 68* 67* 83* 83*  ALKPHOS 128* 149* 136* 121 111  BILITOT 2.5* 1.9* 1.8* 2.6* 2.0*  PROT 7.0 7.1 7.1 7.1 6.5  ALBUMIN 2.5* 2.5* 2.9* 2.9* 2.8*   Recent Labs  Lab 11/20/19 0340 11/21/19 0252  LIPASE 392* 37   No results for input(s): AMMONIA in the last 168 hours. CBC: Recent Labs  Lab 11/20/19 0340 11/21/19 0252 11/22/19 0255 11/23/19 0233 11/25/19 0258  WBC 13.0* 6.9 7.9 8.8 10.5  HGB 11.5* 13.0 12.9* 14.5 15.7  HCT 36.2* 40.1 40.2 45.4 50.3  MCV 90.3 88.5 88.4 89.9 90.3  PLT 120* 145* 150 181 193   Cardiac Enzymes: No results for input(s): CKTOTAL, CKMB, CKMBINDEX, TROPONINI in the last 168 hours. BNP: BNP (last 3 results) Recent Labs    11/20/19 0340  BNP 186.6*    ProBNP (last 3 results) No results for input(s): PROBNP in the last 8760 hours.  CBG: Recent Labs  Lab 11/24/19 1110 11/24/19 1621 11/24/19 2124 11/25/19 0645 11/25/19 1130  GLUCAP 225* 226* 181* 175* 176*       Signed:  Domenic Polite MD.  [facility] 11/25/2019, 2:41 PM

## 2019-11-25 NOTE — Progress Notes (Signed)
[facility])  Referral received for palliative care services at home once discharged.  [facility] will follow up with pt at home.  Venia Carbon RN, BSN, [facility] Liaison

## 2019-11-26 LAB — GLUCOSE, CAPILLARY
Glucose-Capillary: 181 mg/dL — ABNORMAL HIGH (ref 70–99)
Glucose-Capillary: 153 mg/dL — ABNORMAL HIGH (ref 70–99)

## 2019-11-26 NOTE — TOC Transition Note (Signed)
Transition of Care [facility]) - CM/SW Discharge Note   Patient Details  Name: David Morgan MRN: [id number] Date of Birth: 06-14-1925  Transition of Care [facility]) CM/SW Contact:  Claudie Leach, RN 11/26/2019, 2:12 PM   Clinical Narrative:    Patient to dc home with [facility] services.  [facility] is able to take referral.  Patient's wife asking for custodial services through [facility].  [name] is aware and will attempt to arrange.    Patient's wife refuses bed, lift, 3n1 or other DME.    PTAR arranged for 2pm.   Final next level of care: [place] Barriers to Discharge: Continued Medical Work up   Patient Goals and CMS Choice Patient states their goals for this hospitalization and ongoing recovery are:: get stronger CMS Medicare.gov Compare Post Acute Care list provided to:: Patient Represenative (must comment) Choice offered to / list presented to : Spouse  Discharge Placement                       Discharge Plan and Services In-house Referral: Clinical Social Work Discharge Planning Services: CM Consult Post Acute Care Choice: Home Health          DME Arranged: N/A DME Agency: NA       HH Arranged: PT, OT, Nurse's Aide, RN [place] Agency: [place] Date [place]: 11/26/19 Time HH Agency Contacted: 1200 Representative spoke with at [place]: [name] ROse  Social Determinants of Health ([place]) Interventions     Readmission Risk Interventions No flowsheet data found.

## 2019-11-28 ENCOUNTER — Encounter: Payer: Self-pay | Admitting: Emergency Medicine

## 2019-11-28 ENCOUNTER — Other Ambulatory Visit: Payer: Self-pay

## 2019-11-28 ENCOUNTER — Emergency Department: Payer: PPO

## 2019-11-28 ENCOUNTER — Inpatient Hospital Stay
Admission: EM | Admit: 2019-11-28 | Discharge: 2019-12-02 | DRG: 871 | Disposition: A | Discharge status: Skilled Nursing Facility | Payer: PPO | Attending: Internal Medicine | Admitting: Internal Medicine

## 2019-11-28 DIAGNOSIS — S0990XA Unspecified injury of head, initial encounter: Secondary | ICD-10-CM | POA: Diagnosis not present

## 2019-11-28 DIAGNOSIS — I5032 Chronic diastolic (congestive) heart failure: Secondary | ICD-10-CM | POA: Diagnosis present

## 2019-11-28 DIAGNOSIS — N419 Inflammatory disease of prostate, unspecified: Secondary | ICD-10-CM | POA: Diagnosis not present

## 2019-11-28 DIAGNOSIS — K859 Acute pancreatitis without necrosis or infection, unspecified: Secondary | ICD-10-CM | POA: Diagnosis present

## 2019-11-28 DIAGNOSIS — Z96651 Presence of right artificial knee joint: Secondary | ICD-10-CM | POA: Diagnosis present

## 2019-11-28 DIAGNOSIS — K819 Cholecystitis, unspecified: Secondary | ICD-10-CM

## 2019-11-28 DIAGNOSIS — K8309 Other cholangitis: Secondary | ICD-10-CM | POA: Diagnosis not present

## 2019-11-28 DIAGNOSIS — N189 Chronic kidney disease, unspecified: Secondary | ICD-10-CM | POA: Diagnosis not present

## 2019-11-28 DIAGNOSIS — R000 Tachycardia, unspecified: Secondary | ICD-10-CM | POA: Diagnosis not present

## 2019-11-28 DIAGNOSIS — E876 Hypokalemia: Secondary | ICD-10-CM | POA: Diagnosis not present

## 2019-11-28 DIAGNOSIS — R001 Bradycardia, unspecified: Secondary | ICD-10-CM | POA: Diagnosis not present

## 2019-11-28 DIAGNOSIS — E872 Acidosis, unspecified: Secondary | ICD-10-CM

## 2019-11-28 DIAGNOSIS — Z515 Encounter for palliative care: Secondary | ICD-10-CM | POA: Diagnosis not present

## 2019-11-28 DIAGNOSIS — Z961 Presence of intraocular lens: Secondary | ICD-10-CM | POA: Diagnosis present

## 2019-11-28 DIAGNOSIS — Z7401 Bed confinement status: Secondary | ICD-10-CM | POA: Diagnosis not present

## 2019-11-28 DIAGNOSIS — N179 Acute kidney failure, unspecified: Secondary | ICD-10-CM

## 2019-11-28 DIAGNOSIS — Z79890 Hormone replacement therapy (postmenopausal): Secondary | ICD-10-CM

## 2019-11-28 DIAGNOSIS — I4891 Unspecified atrial fibrillation: Secondary | ICD-10-CM | POA: Diagnosis not present

## 2019-11-28 DIAGNOSIS — R296 Repeated falls: Secondary | ICD-10-CM | POA: Diagnosis present

## 2019-11-28 DIAGNOSIS — H9190 Unspecified hearing loss, unspecified ear: Secondary | ICD-10-CM | POA: Diagnosis present

## 2019-11-28 DIAGNOSIS — A419 Sepsis, unspecified organism: Principal | ICD-10-CM

## 2019-11-28 DIAGNOSIS — R6521 Severe sepsis with septic shock: Secondary | ICD-10-CM | POA: Diagnosis present

## 2019-11-28 DIAGNOSIS — R05 Cough: Secondary | ICD-10-CM | POA: Diagnosis not present

## 2019-11-28 DIAGNOSIS — K8020 Calculus of gallbladder without cholecystitis without obstruction: Secondary | ICD-10-CM | POA: Diagnosis not present

## 2019-11-28 DIAGNOSIS — J449 Chronic obstructive pulmonary disease, unspecified: Secondary | ICD-10-CM | POA: Diagnosis present

## 2019-11-28 DIAGNOSIS — N400 Enlarged prostate without lower urinary tract symptoms: Secondary | ICD-10-CM | POA: Diagnosis not present

## 2019-11-28 DIAGNOSIS — N170 Acute kidney failure with tubular necrosis: Secondary | ICD-10-CM | POA: Diagnosis not present

## 2019-11-28 DIAGNOSIS — Z20822 Contact with and (suspected) exposure to covid-19: Secondary | ICD-10-CM | POA: Diagnosis present

## 2019-11-28 DIAGNOSIS — E1122 Type 2 diabetes mellitus with diabetic chronic kidney disease: Secondary | ICD-10-CM | POA: Diagnosis not present

## 2019-11-28 DIAGNOSIS — E039 Hypothyroidism, unspecified: Secondary | ICD-10-CM | POA: Diagnosis present

## 2019-11-28 DIAGNOSIS — I350 Nonrheumatic aortic (valve) stenosis: Secondary | ICD-10-CM | POA: Diagnosis present

## 2019-11-28 DIAGNOSIS — I130 Hypertensive heart and chronic kidney disease with heart failure and stage 1 through stage 4 chronic kidney disease, or unspecified chronic kidney disease: Secondary | ICD-10-CM | POA: Diagnosis not present

## 2019-11-28 DIAGNOSIS — Z66 Do not resuscitate: Secondary | ICD-10-CM | POA: Diagnosis present

## 2019-11-28 DIAGNOSIS — I491 Atrial premature depolarization: Secondary | ICD-10-CM | POA: Diagnosis not present

## 2019-11-28 DIAGNOSIS — Z8249 Family history of ischemic heart disease and other diseases of the circulatory system: Secondary | ICD-10-CM

## 2019-11-28 DIAGNOSIS — R10819 Abdominal tenderness, unspecified site: Secondary | ICD-10-CM | POA: Diagnosis not present

## 2019-11-28 DIAGNOSIS — I272 Pulmonary hypertension, unspecified: Secondary | ICD-10-CM | POA: Diagnosis present

## 2019-11-28 DIAGNOSIS — Z79899 Other long term (current) drug therapy: Secondary | ICD-10-CM

## 2019-11-28 DIAGNOSIS — K8010 Calculus of gallbladder with chronic cholecystitis without obstruction: Secondary | ICD-10-CM | POA: Diagnosis not present

## 2019-11-28 DIAGNOSIS — Z7984 Long term (current) use of oral hypoglycemic drugs: Secondary | ICD-10-CM

## 2019-11-28 DIAGNOSIS — E1151 Type 2 diabetes mellitus with diabetic peripheral angiopathy without gangrene: Secondary | ICD-10-CM | POA: Diagnosis not present

## 2019-11-28 DIAGNOSIS — R059 Cough, unspecified: Secondary | ICD-10-CM

## 2019-11-28 DIAGNOSIS — M2550 Pain in unspecified joint: Secondary | ICD-10-CM | POA: Diagnosis not present

## 2019-11-28 DIAGNOSIS — E785 Hyperlipidemia, unspecified: Secondary | ICD-10-CM | POA: Diagnosis not present

## 2019-11-28 DIAGNOSIS — K858 Other acute pancreatitis without necrosis or infection: Secondary | ICD-10-CM | POA: Diagnosis not present

## 2019-11-28 DIAGNOSIS — I10 Essential (primary) hypertension: Secondary | ICD-10-CM | POA: Diagnosis not present

## 2019-11-28 DIAGNOSIS — I959 Hypotension, unspecified: Secondary | ICD-10-CM | POA: Diagnosis not present

## 2019-11-28 DIAGNOSIS — Z7189 Other specified counseling: Secondary | ICD-10-CM | POA: Diagnosis not present

## 2019-11-28 DIAGNOSIS — E1165 Type 2 diabetes mellitus with hyperglycemia: Secondary | ICD-10-CM | POA: Diagnosis not present

## 2019-11-28 DIAGNOSIS — F0390 Unspecified dementia without behavioral disturbance: Secondary | ICD-10-CM | POA: Diagnosis present

## 2019-11-28 DIAGNOSIS — R531 Weakness: Secondary | ICD-10-CM | POA: Diagnosis not present

## 2019-11-28 DIAGNOSIS — N182 Chronic kidney disease, stage 2 (mild): Secondary | ICD-10-CM | POA: Diagnosis present

## 2019-11-28 DIAGNOSIS — F0150 Vascular dementia without behavioral disturbance: Secondary | ICD-10-CM | POA: Diagnosis not present

## 2019-11-28 DIAGNOSIS — M6281 Muscle weakness (generalized): Secondary | ICD-10-CM | POA: Diagnosis not present

## 2019-11-28 DIAGNOSIS — R0902 Hypoxemia: Secondary | ICD-10-CM | POA: Diagnosis not present

## 2019-11-28 DIAGNOSIS — R404 Transient alteration of awareness: Secondary | ICD-10-CM | POA: Diagnosis not present

## 2019-11-28 DIAGNOSIS — E869 Volume depletion, unspecified: Secondary | ICD-10-CM | POA: Diagnosis present

## 2019-11-28 DIAGNOSIS — Z9842 Cataract extraction status, left eye: Secondary | ICD-10-CM

## 2019-11-28 DIAGNOSIS — Z8551 Personal history of malignant neoplasm of bladder: Secondary | ICD-10-CM

## 2019-11-28 DIAGNOSIS — Z87891 Personal history of nicotine dependence: Secondary | ICD-10-CM

## 2019-11-28 DIAGNOSIS — Z888 Allergy status to other drugs, medicaments and biological substances status: Secondary | ICD-10-CM

## 2019-11-28 DIAGNOSIS — Z8673 Personal history of transient ischemic attack (TIA), and cerebral infarction without residual deficits: Secondary | ICD-10-CM

## 2019-11-28 DIAGNOSIS — Z9841 Cataract extraction status, right eye: Secondary | ICD-10-CM

## 2019-11-28 DIAGNOSIS — Z91048 Other nonmedicinal substance allergy status: Secondary | ICD-10-CM

## 2019-11-28 DIAGNOSIS — G20 Parkinson's disease: Secondary | ICD-10-CM | POA: Diagnosis not present

## 2019-11-28 DIAGNOSIS — S299XXA Unspecified injury of thorax, initial encounter: Secondary | ICD-10-CM | POA: Diagnosis not present

## 2019-11-28 LAB — CBC
HCT: 46 % (ref 39.0–52.0)
Hemoglobin: 14.7 g/dL (ref 13.0–17.0)
MCH: 28.4 pg (ref 26.0–34.0)
MCHC: 32 g/dL (ref 30.0–36.0)
MCV: 88.8 fL (ref 80.0–100.0)
Platelets: 196 10*3/uL (ref 150–400)
RBC: 5.18 MIL/uL (ref 4.22–5.81)
RDW: 14.4 % (ref 11.5–15.5)
WBC: 16.1 10*3/uL — ABNORMAL HIGH (ref 4.0–10.5)
nRBC: 0 % (ref 0.0–0.2)

## 2019-11-28 LAB — URINALYSIS, COMPLETE (UACMP) WITH MICROSCOPIC
Bacteria, UA: NONE SEEN
Bilirubin Urine: NEGATIVE
Glucose, UA: NEGATIVE mg/dL
Hgb urine dipstick: NEGATIVE
Ketones, ur: NEGATIVE mg/dL
Nitrite: NEGATIVE
Protein, ur: NEGATIVE mg/dL
Specific Gravity, Urine: 1.016 (ref 1.005–1.030)
pH: 5 (ref 5.0–8.0)

## 2019-11-28 LAB — RESPIRATORY PANEL BY RT PCR (FLU A&B, COVID)
Influenza A by PCR: NEGATIVE
Influenza B by PCR: NEGATIVE
SARS Coronavirus 2 by RT PCR: NEGATIVE

## 2019-11-28 LAB — BASIC METABOLIC PANEL
Anion gap: 13 (ref 5–15)
BUN: 60 mg/dL — ABNORMAL HIGH (ref 8–23)
CO2: 25 mmol/L (ref 22–32)
Calcium: 8.3 mg/dL — ABNORMAL LOW (ref 8.9–10.3)
Chloride: 95 mmol/L — ABNORMAL LOW (ref 98–111)
Creatinine, Ser: 1.84 mg/dL — ABNORMAL HIGH (ref 0.61–1.24)
GFR calc Af Amer: 36 mL/min — ABNORMAL LOW (ref >60)
GFR calc non Af Amer: 31 mL/min — ABNORMAL LOW (ref >60)
Glucose, Bld: 225 mg/dL — ABNORMAL HIGH (ref 70–99)
Potassium: 3.4 mmol/L — ABNORMAL LOW (ref 3.5–5.1)
Sodium: 133 mmol/L — ABNORMAL LOW (ref 135–145)

## 2019-11-28 LAB — HEPATIC FUNCTION PANEL
ALT: 41 U/L (ref 0–44)
AST: 31 U/L (ref 15–41)
Albumin: 2.9 g/dL — ABNORMAL LOW (ref 3.5–5.0)
Alkaline Phosphatase: 84 U/L (ref 38–126)
Bilirubin, Direct: 0.5 mg/dL — ABNORMAL HIGH (ref 0.0–0.2)
Indirect Bilirubin: 1.2 mg/dL — ABNORMAL HIGH (ref 0.3–0.9)
Total Bilirubin: 1.7 mg/dL — ABNORMAL HIGH (ref 0.3–1.2)
Total Protein: 6.6 g/dL (ref 6.5–8.1)

## 2019-11-28 LAB — LIPASE, BLOOD: Lipase: 104 U/L — ABNORMAL HIGH (ref 11–51)

## 2019-11-28 LAB — LACTIC ACID, PLASMA
Lactic Acid, Venous: 3.9 mmol/L (ref 0.5–1.9)
Lactic Acid, Venous: 1.9 mmol/L (ref 0.5–1.9)

## 2019-11-28 MED ORDER — SODIUM CHLORIDE 0.9 % IV BOLUS (SEPSIS)
1000.0000 mL | Freq: Once | INTRAVENOUS | Status: AC
  Administered 2019-11-28: 21:00:00 1000 mL via INTRAVENOUS

## 2019-11-28 MED ORDER — LEVOTHYROXINE SODIUM 112 MCG PO TABS
112.0000 ug | ORAL_TABLET | Freq: Every day | ORAL | Status: DC
  Administered 2019-11-29 – 2019-12-02 (×4): 112 ug via ORAL
  Filled 2019-11-28 (×5): qty 1

## 2019-11-28 MED ORDER — SODIUM CHLORIDE 0.9 % IV BOLUS (SEPSIS)
1000.0000 mL | Freq: Once | INTRAVENOUS | Status: AC
  Administered 2019-11-28: 1000 mL via INTRAVENOUS

## 2019-11-28 MED ORDER — DONEPEZIL HCL 5 MG PO TABS
5.0000 mg | ORAL_TABLET | Freq: Every day | ORAL | Status: DC
  Administered 2019-11-29 – 2019-12-02 (×4): 5 mg via ORAL
  Filled 2019-11-28 (×5): qty 1

## 2019-11-28 MED ORDER — HEPARIN SODIUM (PORCINE) 5000 UNIT/ML IJ SOLN
5000.0000 [IU] | Freq: Three times a day (TID) | INTRAMUSCULAR | Status: DC
  Administered 2019-11-29 – 2019-12-02 (×9): 5000 [IU] via SUBCUTANEOUS
  Filled 2019-11-28 (×9): qty 1

## 2019-11-28 MED ORDER — VANCOMYCIN HCL 2000 MG/400ML IV SOLN
2000.0000 mg | Freq: Once | INTRAVENOUS | Status: DC
  Filled 2019-11-28: qty 400

## 2019-11-28 MED ORDER — LACTATED RINGERS IV BOLUS (SEPSIS): 1000.0000 mL | Freq: Once | INTRAVENOUS | Status: DC

## 2019-11-28 MED ORDER — LACTATED RINGERS IV SOLN: INTRAVENOUS | Status: DC

## 2019-11-28 MED ORDER — ACETAMINOPHEN 325 MG PO TABS: 650.0000 mg | ORAL_TABLET | Freq: Four times a day (QID) | ORAL | Status: DC | PRN

## 2019-11-28 MED ORDER — ADULT MULTIVITAMIN W/MINERALS CH
1.0000 | ORAL_TABLET | Freq: Every day | ORAL | Status: DC
  Administered 2019-11-29 – 2019-12-02 (×4): 1 via ORAL
  Filled 2019-11-28 (×4): qty 1

## 2019-11-28 MED ORDER — ACETAMINOPHEN 650 MG RE SUPP: 650.0000 mg | Freq: Four times a day (QID) | RECTAL | Status: DC | PRN

## 2019-11-28 MED ORDER — SODIUM CHLORIDE 0.9 % IV SOLN
2.0000 g | Freq: Once | INTRAVENOUS | Status: AC
  Administered 2019-11-28: 2 g via INTRAVENOUS
  Filled 2019-11-28: qty 2

## 2019-11-28 MED ORDER — METRONIDAZOLE IN NACL 5-0.79 MG/ML-% IV SOLN
500.0000 mg | Freq: Three times a day (TID) | INTRAVENOUS | Status: DC
  Administered 2019-11-29 – 2019-12-02 (×10): 500 mg via INTRAVENOUS
  Filled 2019-11-28 (×15): qty 100

## 2019-11-28 MED ORDER — ASPIRIN EC 81 MG PO TBEC
81.0000 mg | DELAYED_RELEASE_TABLET | Freq: Every day | ORAL | Status: DC
  Administered 2019-11-29 – 2019-12-02 (×4): 81 mg via ORAL
  Filled 2019-11-28 (×4): qty 1

## 2019-11-28 MED ORDER — SENNOSIDES-DOCUSATE SODIUM 8.6-50 MG PO TABS: 1.0000 | ORAL_TABLET | Freq: Every evening | ORAL | Status: DC | PRN

## 2019-11-28 MED ORDER — SODIUM CHLORIDE 0.9 % IV SOLN: 2.0000 g | Freq: Once | INTRAVENOUS | Status: DC

## 2019-11-28 MED ORDER — VANCOMYCIN HCL IN DEXTROSE 1-5 GM/200ML-% IV SOLN
1000.0000 mg | Freq: Once | INTRAVENOUS | Status: DC
  Administered 2019-11-28: 1000 mg via INTRAVENOUS
  Filled 2019-11-28: qty 200

## 2019-11-28 MED ORDER — SODIUM CHLORIDE 0.9 % IV BOLUS
1000.0000 mL | Freq: Once | INTRAVENOUS | Status: AC
  Administered 2019-11-28: 1000 mL via INTRAVENOUS

## 2019-11-28 MED ORDER — TAMSULOSIN HCL 0.4 MG PO CAPS
0.4000 mg | ORAL_CAPSULE | Freq: Every evening | ORAL | Status: DC
  Administered 2019-11-29 – 2019-12-01 (×3): 0.4 mg via ORAL
  Filled 2019-11-28 (×3): qty 1

## 2019-11-28 MED ORDER — CARVEDILOL 3.125 MG PO TABS
3.1250 mg | ORAL_TABLET | Freq: Two times a day (BID) | ORAL | Status: DC
  Administered 2019-11-30 – 2019-12-02 (×4): 3.125 mg via ORAL
  Filled 2019-11-28 (×4): qty 1

## 2019-11-28 NOTE — ED Notes (Signed)
500cc fluid bolus initiated, md siadecki at bedside

## 2019-11-28 NOTE — ED Provider Notes (Signed)
[facility] Emergency Department Provider Note ____________________________________________   First MD Initiated Contact with Patient 11/28/19 2003     (approximate)  I have reviewed the triage vital signs and the nursing notes.   HISTORY  Chief Complaint Fall  Level 5 caveat: History present illness limited to due to hearing impairment and possible altered mental status  HPI David Morgan is a 86 y.o. male with PMH as noted below who presents ostensibly after a fall at home and some generalized weakness.  Per EMS, the patient's wife reported that he did not hit his head.  EMS found him to be hypotensive and tachycardic.  The patient is unable to give any meaningful history.  Past Medical History:  Diagnosis Date  . Arthritis   . Cancer ([place])   . Claudication ([place])   . Diverticulitis   . Emphysema/COPD ([place])   . History of bladder cancer   . History of carotid artery stenosis   . History of TIA (transient ischemic attack)    2012--  NO RESIDUAL (PER SCAN HAD A PREVIOUS TIA BEFORE 2012)  . Hyperlipidemia   . Hypertension   . Hypothyroidism   . Lesion of bladder   . Mild asthma    NO INHALER  . Nocturia   . Peripheral vascular disease ([place])   . Short of breath on exertion   . Urgency of urination   . Wears glasses     Patient Active Problem List   Diagnosis Date Noted  . Acute diastolic CHF (congestive heart failure) ([place]) 11/21/2019  . Pulmonary vascular congestion 11/20/2019  . Choledocholithiasis   . Dementia without behavioral disturbance ([place]) 11/19/2019  . Accident due to mechanical fall without injury 11/19/2019  . Hyperglycemia due to type 2 diabetes mellitus ([place]) 11/19/2019  . Leukocytosis 11/19/2019  . AKI (acute kidney injury) ([place]) 11/19/2019  . Troponin I above reference range 11/19/2019  . COPD with acute exacerbation ([place]) 11/19/2019  . Severe sepsis ([place]) 11/19/2019  . Elevated liver enzymes 11/18/2019  .  Cholelithiasis 11/18/2019  . Pressure injury of skin 12/22/2017  . Back pain 12/21/2017  . History of diverticulitis 05/02/2016  . Difficulty walking 02/04/2016  . Tremor 02/04/2016  . Weakness 02/04/2016  . Disease of thyroid gland 07/10/2015  . BP (high blood pressure) 07/10/2015  . HLD (hyperlipidemia) 07/10/2015  . BPH (benign prostatic hyperplasia) 08/02/2014    Past Surgical History:  Procedure Laterality Date  . CAROTID ENDARTERECTOMY Right 2005  . CARPAL TUNNEL RELEASE Bilateral 2002  &  2007  . CATARACT EXTRACTION W/ INTRAOCULAR LENS  IMPLANT, BILATERAL    . CYSTOSCOPY W/ RETROGRADES Bilateral 12/26/2013   Procedure: BILATERAL RETROGRADE PYELOGRAM;  Surgeon: Claybon Jabs, MD;  [facility];  Service: Urology;  Laterality: Bilateral;  . CYSTOSCOPY WITH BIOPSY N/A 12/26/2013   Procedure: CYSTOSCOPY WITH BLADDER BIOPSY;  Surgeon: Claybon Jabs, MD;  Location: [facility];  Service: Urology;  Laterality: N/A;  . ERCP N/A 11/19/2019   Procedure: ENDOSCOPIC RETROGRADE CHOLANGIOPANCREATOGRAPHY (ERCP);  Surgeon: Gatha Mayer, MD;  Location: [facility] ENDOSCOPY;  Service: Endoscopy;  Laterality: N/A;  . INGUINAL HERNIA REPAIR  YRS AGO  . LAPAROSCOPIC LYSIS OF ADHESIONS  06/28/2015   Procedure: LAPAROSCOPIC LYSIS OF ADHESIONS;  Surgeon: Clayburn Pert, MD;  Location: [facility] ORS;  Service: General;;  . LAPAROSCOPY  06/28/2015   Procedure: LAPAROSCOPY DIAGNOSTIC;  Surgeon: Clayburn Pert, MD;  Location: [facility] ORS;  Service: General;;  . LAPAROTOMY  N/A 09/03/2015   Procedure: umbilical hernia repair with mesh;  Surgeon: Hubbard Robinson, MD;  Location: [facility] ORS;  Service: General;  Laterality: N/A;  . ORIF HIP FRACTURE Left 2008   RETAINED HARDWARE  . REMOVAL OF STONES  11/19/2019   Procedure: REMOVAL OF STONES;  Surgeon: Gatha Mayer, MD;  Location: [facility] ENDOSCOPY;  Service: Endoscopy;;  . RIGHT SHOULDER  SURGERY  2005  . SPHINCTEROTOMY  11/19/2019    Procedure: SPHINCTEROTOMY;  Surgeon: Gatha Mayer, MD;  Location: [facility] ENDOSCOPY;  Service: Endoscopy;;  . TOTAL KNEE ARTHROPLASTY Right 2004  . TRANSURETHRAL RESECTION OF BLADDER TUMOR  1990  . UMBILICAL HERNIA REPAIR   2009  &  2011    Prior to Admission medications   Medication Sig Start Date End Date Taking? Authorizing Provider  carvedilol (COREG) 3.125 MG tablet Take 3.125 mg by mouth 2 (two) times daily with a meal.   Yes [provider]  donepezil (ARICEPT) 5 MG tablet Take 5 mg by mouth daily. 09/30/19  Yes [provider]  hydrochlorothiazide (HYDRODIURIL) 12.5 MG tablet Take 12.5 mg by mouth daily. 09/16/19  Yes [provider]  ketoconazole (NIZORAL) 2 % cream Apply 1-2 application topically daily.  10/27/19  Yes [provider]  levothyroxine (SYNTHROID, LEVOTHROID) 112 MCG tablet Take 112 mcg by mouth daily before breakfast.   Yes [provider]  losartan (COZAAR) 50 MG tablet Take 50 mg by mouth daily.  06/26/15  Yes [provider]  metFORMIN (GLUCOPHAGE) 500 MG tablet Take 500 mg by mouth daily. 09/16/19  Yes [provider]  Multiple Vitamin (MULTI-VITAMINS) TABS Take 1 tablet by mouth daily.   Yes [provider]  tamsulosin (FLOMAX) 0.4 MG CAPS capsule Take 0.4 mg by mouth every evening.    Yes [provider]  ciprofloxacin (CIPRO) 500 MG tablet Take 1 tablet (500 mg total) by mouth 2 (two) times daily. 11/25/19   Domenic Polite, MD  metroNIDAZOLE (FLAGYL) 500 MG tablet Take 1 tablet (500 mg total) by mouth every 8 (eight) hours. 11/25/19   Domenic Polite, MD    Allergies Adhesive [tape] and Augmentin [amoxicillin-pot clavulanate]  Family History  Problem Relation Age of Onset  . Hypertension Father   . Alzheimer's disease Mother     Social History Social History   Tobacco Use  . Smoking status: Former Smoker    Packs/day: 1.00    Years: 40.00    Pack years: 40.00    Types:  Cigarettes    Quit date: 12/21/1993    Years since quitting: 25.9  . Smokeless tobacco: Never Used  Substance Use Topics  . Alcohol use: No    Comment: RARE  . Drug use: No    Review of Systems Level 5 caveat: Unable to obtain review of systems due to hearing impairment and possible altered mental status    ____________________________________________   PHYSICAL EXAM:  VITAL SIGNS: ED Triage Vitals  Enc Vitals Group     BP 11/28/19 2010 (!) 86/49     Pulse Rate 11/28/19 2005 79     Resp 11/28/19 2005 20     Temp 11/28/19 2005 98 F (36.7 C)     Temp Source 11/28/19 2005 Oral     SpO2 11/28/19 2005 100 %     Weight 11/28/19 2007 218 lb 11.1 oz (99.2 kg)     Height 11/28/19 2007 5\' 9"  (1.753 m)     Head Circumference --  Peak Flow --      Pain Score --      Pain Loc --      Pain Edu? --      Excl. in [place]? --     Constitutional: Awake but somewhat lethargic appearing. Eyes: Conjunctivae are normal.  EOMI. Head: Atraumatic. Nose: No congestion/rhinnorhea. Mouth/Throat: Mucous membranes are dry. Neck: Normal range of motion.  Cardiovascular: Tachycardic, irregular rhythm. Grossly normal heart sounds.  Good peripheral circulation. Respiratory: Normal respiratory effort.  No retractions. Lungs CTAB. Gastrointestinal: Soft and nontender. No distention.  Genitourinary: No flank tenderness. Musculoskeletal: Extremities warm and well perfused.  Neurologic: Motor intact in all extremities.  Following commands. Skin:  Skin is warm and dry. No rash noted. Psychiatric: Unable to assess.  ____________________________________________   LABS (all labs ordered are listed, but only abnormal results are displayed)  Labs Reviewed  BASIC METABOLIC PANEL - Abnormal; Notable for the following components:      Result Value   Sodium 133 (*)    Potassium 3.4 (*)    Chloride 95 (*)    Glucose, Bld 225 (*)    BUN 60 (*)    Creatinine, Ser 1.84 (*)    Calcium 8.3 (*)    GFR  calc non Af Amer 31 (*)    GFR calc Af Amer 36 (*)    All other components within normal limits  CBC - Abnormal; Notable for the following components:   WBC 16.1 (*)    All other components within normal limits  URINALYSIS, COMPLETE (UACMP) WITH MICROSCOPIC - Abnormal; Notable for the following components:   Color, Urine AMBER (*)    APPearance HAZY (*)    Leukocytes,Ua TRACE (*)    All other components within normal limits  LACTIC ACID, PLASMA - Abnormal; Notable for the following components:   Lactic Acid, Venous 3.9 (*)    All other components within normal limits  LIPASE, BLOOD - Abnormal; Notable for the following components:   Lipase 104 (*)    All other components within normal limits  HEPATIC FUNCTION PANEL - Abnormal; Notable for the following components:   Albumin 2.9 (*)    Total Bilirubin 1.7 (*)    Bilirubin, Direct 0.5 (*)    Indirect Bilirubin 1.2 (*)    All other components within normal limits  RESPIRATORY PANEL BY RT PCR (FLU A&B, COVID)  CULTURE, BLOOD (ROUTINE X 2)  CULTURE, BLOOD (ROUTINE X 2)  LACTIC ACID, PLASMA   ____________________________________________  EKG  ED ECG REPORT I, [name], the attending physician, personally viewed and interpreted this ECG.  Date: 11/28/2019 EKG Time: 2006 Rate: 106 Rhythm: Atrial fibrillation QRS Axis: normal Intervals: normal ST/T Wave abnormalities: normal Narrative Interpretation: no evidence of acute ischemia  ____________________________________________  RADIOLOGY  CXR: No focal infiltrate or other acute abnormality CT head: No acute abnormality  ____________________________________________   PROCEDURES  Procedure(s) performed: No  Procedures  Critical Care performed: Yes  CRITICAL CARE Performed by: [name]   Total critical care time: 30 minutes  Critical care time was exclusive of separately billable procedures and treating other patients.  Critical care was  necessary to treat or prevent imminent or life-threatening deterioration.  Critical care was time spent personally by me on the following activities: development of treatment plan with patient and/or surrogate as well as nursing, discussions with consultants, evaluation of patient's response to treatment, examination of patient, obtaining history from patient or surrogate, ordering and performing treatments and interventions, ordering and  review of laboratory studies, ordering and review of radiographic studies, pulse oximetry and re-evaluation of patient's condition. ____________________________________________   INITIAL IMPRESSION / ASSESSMENT AND PLAN / ED COURSE  Pertinent labs & imaging results that were available during my care of the patient were reviewed by me and considered in my medical decision making (see chart for details).  86 year old male with PMH as noted above presents after a fall at home with generalized weakness.  Per EMS, the wife reported that he did not hit his head.  On exam, the patient is awake but somewhat lethargic appearing.  He is able to follow commands visually but is not answering questions appropriately; it is somewhat unclear whether he is altered/confused versus very hard of hearing.  He is able to move all his extremities.  He is tachycardic and hypotensive with otherwise normal vital signs.  The abdomen soft and nontender.  He has no respiratory distress.  I reviewed the past medical records in [place].  The patient was recently hospitalized due to sepsis and choledocholithiasis, and discharged 3 days ago.  I actually partly took care of the patient during his initial ED visit on 4/2 and at that time he was slightly confused but able to answer some questions and appeared more alert.  Overall presentation is concerning for recurrent sepsis especially given the patient's vital signs.  Differential includes UTI, pneumonia, Covid, or possible recurrent  choledocholithiasis or other hepatobiliary process.  However, I have a lower suspicion for intra-abdominal process given the patient's lack of any abdominal pain or tenderness on exam.  We will obtain CT head, chest x-ray, lab work-up, and reassess.  ----------------------------------------- 11:11 PM on 11/28/2019 -----------------------------------------  The patient's blood pressure and heart rate have markedly improved.  Lab work-up is consistent with sepsis, with an elevated lactic acid and WBC count.  However, the work-up is otherwise not suggestive of any specific source.  Chest x-ray shows no infiltrate, urinalysis negative, and the patient's LFTs are not elevated.  The patient's mental status is also improving and he is now more verbal.  I have initiated broad-spectrum antibiotics per the sepsis protocol.  I discussed the case with the hospitalist for admission. _____________________________   FINAL CLINICAL IMPRESSION(S) / ED DIAGNOSES  Final diagnoses:  Sepsis, due to unspecified organism, unspecified whether acute organ dysfunction present [facility])      NEW MEDICATIONS STARTED DURING THIS VISIT:  New Prescriptions   No medications on file     Note:  This document was prepared using Dragon voice recognition software and may include unintentional dictation errors.    [name], MD 11/28/19 [phone]

## 2019-11-28 NOTE — ED Notes (Signed)
Pt noted to have vomited on self. Pt cleaned by this RN and head of bed elevated. Pt given 4mg  zofran verbal order md siadecki

## 2019-11-28 NOTE — H&P (Addendum)
Chief Complaint: Patient was brought in by EMS on account of altered mental status. HPI: David Tae Johnstonis a 86 y.o.year old [place] medical history significant for emphysematous COPD, HTN, HLD, hypothyroidism who was recently admitted to [facility] main campus on account of acute cholelithiasis and had an ERCP and sphincterotomy done.  Patient was subsequently discharged back home with palliative care follow-up barely a couple of days ago.  EMS was called by wife today on account of altered mental status today and subsequent fall.  Patient was brought back to the emergency room to be further evaluated.  On presentation, patient was noted to be lethargic and hypotensive.  His lactic acid was noted to be elevated.  Patient had signs and symptoms consistent with sepsis of unclear etiology.  Chest x-ray and urinalysis thus far has been unremarkable.  CT of the abdomen is currently pending.  Hospitalist was consulted to evaluate patient for admission and management of sepsis.  Patient has already been initiated on cefepime and metronidazole.    CT scan of the head secondary to fall was negative for any acute intracranial normality.  Past Medical History:  Diagnosis Date  . Arthritis   . Cancer ([place])   . Claudication ([place])   . Diverticulitis   . Emphysema/COPD ([place])   . History of bladder cancer   . History of carotid artery stenosis   . History of TIA (transient ischemic attack)    2012--  NO RESIDUAL (PER SCAN HAD A PREVIOUS TIA BEFORE 2012)  . Hyperlipidemia   . Hypertension   . Hypothyroidism   . Lesion of bladder   . Mild asthma    NO INHALER  . Nocturia   . Peripheral vascular disease ([place])   . Short of breath on exertion   . Urgency of urination   . Wears glasses     Past Surgical History:  Procedure Laterality Date  . CAROTID ENDARTERECTOMY Right 2005  . CARPAL TUNNEL RELEASE Bilateral 2002  &  2007  . CATARACT EXTRACTION W/ INTRAOCULAR LENS  IMPLANT, BILATERAL    .  CYSTOSCOPY W/ RETROGRADES Bilateral 12/26/2013   Procedure: BILATERAL RETROGRADE PYELOGRAM;  Surgeon: Claybon Jabs, MD;  Location: [facility];  Service: Urology;  Laterality: Bilateral;  . CYSTOSCOPY WITH BIOPSY N/A 12/26/2013   Procedure: CYSTOSCOPY WITH BLADDER BIOPSY;  Surgeon: Claybon Jabs, MD;  Location[facility];  Service: Urology;  Laterality: N/A;  . ERCP N/A 11/19/2019   Procedure: ENDOSCOPIC RETROGRADE CHOLANGIOPANCREATOGRAPHY (ERCP);  Surgeon: Gatha Mayer, MD;  Location: [facility] ENDOSCOPY;  Service: Endoscopy;  Laterality: N/A;  . INGUINAL HERNIA REPAIR  YRS AGO  . LAPAROSCOPIC LYSIS OF ADHESIONS  06/28/2015   Procedure: LAPAROSCOPIC LYSIS OF ADHESIONS;  Surgeon: Clayburn Pert, MD;  Location: [facility] ORS;  Service: General;;  . LAPAROSCOPY  06/28/2015   Procedure: LAPAROSCOPY DIAGNOSTIC;  Surgeon: Clayburn Pert, MD;  Location: [facility] ORS;  Service: General;;  . LAPAROTOMY N/A 09/03/2015   Procedure: umbilical hernia repair with mesh;  Surgeon: Hubbard Robinson, MD;  Location: [facility] ORS;  Service: General;  Laterality: N/A;  . ORIF HIP FRACTURE Left 2008   RETAINED HARDWARE  . REMOVAL OF STONES  11/19/2019   Procedure: REMOVAL OF STONES;  Surgeon: Gatha Mayer, MD;  Location: [facility] ENDOSCOPY;  Service: Endoscopy;;  . RIGHT SHOULDER  SURGERY  2005  . SPHINCTEROTOMY  11/19/2019   Procedure: SPHINCTEROTOMY;  Surgeon: Gatha Mayer, MD;  Location: [facility] ENDOSCOPY;  Service: Endoscopy;;  .  TOTAL KNEE ARTHROPLASTY Right 2004  . TRANSURETHRAL RESECTION OF BLADDER TUMOR  1990  . UMBILICAL HERNIA REPAIR   2009  &  2011    Family History  Problem Relation Age of Onset  . Hypertension Father   . Alzheimer's disease Mother    Social History:  reports that he quit smoking about 25 years ago. His smoking use included cigarettes. He has a 40.00 pack-year smoking history. He has never used smokeless tobacco. He reports that he does not drink alcohol or use  drugs.  Allergies:  Allergies  Allergen Reactions  . Adhesive [Tape] Rash  . Augmentin [Amoxicillin-Pot Clavulanate] Itching, Rash and Other (See Comments)    Has patient had a PCN reaction causing immediate rash, facial/tongue/throat swelling, SOB or lightheadedness with hypotension: No Has patient had a PCN reaction causing severe rash involving mucus membranes or skin necrosis: No Has patient had a PCN reaction that required hospitalization No Has patient had a PCN reaction occurring within the last 10 years: No If all of the above answers are "NO", then may proceed with Cephalosporin use.     (Not in a hospital admission)   Results for orders placed or performed during the hospital encounter of 11/28/19 (from the past 48 hour(s))  Basic metabolic panel     Status: Abnormal   Collection Time: 11/28/19  8:04 PM  Result Value Ref Range   Sodium 133 (L) 135 - 145 mmol/L   Potassium 3.4 (L) 3.5 - 5.1 mmol/L   Chloride 95 (L) 98 - 111 mmol/L   CO2 25 22 - 32 mmol/L   Glucose, Bld 225 (H) 70 - 99 mg/dL    Comment: Glucose reference range applies only to samples taken after fasting for at least 8 hours.   BUN 60 (H) 8 - 23 mg/dL   Creatinine, Ser 1.84 (H) 0.61 - 1.24 mg/dL   Calcium 8.3 (L) 8.9 - 10.3 mg/dL   GFR calc non Af Amer 31 (L) >60 mL/min   GFR calc Af Amer 36 (L) >60 mL/min   Anion gap 13 5 - 15    Comment: Performed at [facility], [address] [place], [place] [address]  CBC     Status: Abnormal   Collection Time: 11/28/19  8:04 PM  Result Value Ref Range   WBC 16.1 (H) 4.0 - 10.5 K/uL   RBC 5.18 4.22 - 5.81 MIL/uL   Hemoglobin 14.7 13.0 - 17.0 g/dL   HCT 46.0 39.0 - 52.0 %   MCV 88.8 80.0 - 100.0 fL   MCH 28.4 26.0 - 34.0 pg   MCHC 32.0 30.0 - 36.0 g/dL   RDW 14.4 11.5 - 15.5 %   Platelets 196 150 - 400 K/uL   nRBC 0.0 0.0 - 0.2 %    Comment: Performed at [facility], [address] [place], [place] [address]  Lipase, blood      Status: Abnormal   Collection Time: 11/28/19  8:04 PM  Result Value Ref Range   Lipase 104 (H) 11 - 51 U/L    Comment: Performed at [facility], [address] [place], [place] [address]  Urinalysis, Complete w Microscopic     Status: Abnormal   Collection Time: 11/28/19  8:11 PM  Result Value Ref Range   Color, Urine AMBER (A) YELLOW    Comment: BIOCHEMICALS MAY BE AFFECTED BY COLOR   APPearance HAZY (A) CLEAR   Specific Gravity, Urine 1.016 1.005 - 1.030  pH 5.0 5.0 - 8.0   Glucose, UA NEGATIVE NEGATIVE mg/dL   Hgb urine dipstick NEGATIVE NEGATIVE   Bilirubin Urine NEGATIVE NEGATIVE   Ketones, ur NEGATIVE NEGATIVE mg/dL   Protein, ur NEGATIVE NEGATIVE mg/dL   Nitrite NEGATIVE NEGATIVE   Leukocytes,Ua TRACE (A) NEGATIVE   RBC / HPF 0-5 0 - 5 RBC/hpf   WBC, UA 0-5 0 - 5 WBC/hpf   Bacteria, UA NONE SEEN NONE SEEN   Squamous Epithelial / LPF 0-5 0 - 5   Mucus PRESENT    Hyaline Casts, UA PRESENT     Comment: Performed at [facility], [address], [place], [place] [address]  Respiratory Panel by RT PCR (Flu A&B, Covid) - Nasopharyngeal Swab     Status: None   Collection Time: 11/28/19  8:11 PM   Specimen: Nasopharyngeal Swab  Result Value Ref Range   SARS Coronavirus 2 by RT PCR NEGATIVE NEGATIVE    Comment: (NOTE) SARS-CoV-2 target nucleic acids are NOT DETECTED. The SARS-CoV-2 RNA is generally detectable in upper respiratoy specimens during the acute phase of infection. The lowest concentration of SARS-CoV-2 viral copies this assay can detect is 131 copies/mL. A negative result does not preclude SARS-Cov-2 infection and should not be used as the sole basis for treatment or other patient management decisions. A negative result may occur with  improper specimen collection/handling, submission of specimen other than nasopharyngeal swab, presence of viral mutation(s) within the areas targeted by this assay, and inadequate number of viral copies (<131  copies/mL). A negative result must be combined with clinical observations, patient history, and epidemiological information. The expected result is Negative. Fact Sheet for Patients:  PinkCheek.be Fact Sheet for Healthcare Providers:  GravelBags.it This test is not yet ap proved or cleared by the [place] FDA and  has been authorized for detection and/or diagnosis of SARS-CoV-2 by FDA under an Emergency Use Authorization (EUA). This EUA will remain  in effect (meaning this test can be used) for the duration of the COVID-19 declaration under Section 564(b)(1) of the Act, 21 U.S.C. section 360bbb-3(b)(1), unless the authorization is terminated or revoked sooner.    Influenza A by PCR NEGATIVE NEGATIVE   Influenza B by PCR NEGATIVE NEGATIVE    Comment: (NOTE) The Xpert Xpress SARS-CoV-2/FLU/RSV assay is intended as an aid in  the diagnosis of influenza from Nasopharyngeal swab specimens and  should not be used as a sole basis for treatment. Nasal washings and  aspirates are unacceptable for Xpert Xpress SARS-CoV-2/FLU/RSV  testing. Fact Sheet for Patients: PinkCheek.be Fact Sheet for Healthcare Providers: GravelBags.it This test is not yet approved or cleared by the [place] FDA and  has been authorized for detection and/or diagnosis of SARS-CoV-2 by  FDA under an Emergency Use Authorization (EUA). This EUA will remain  in effect (meaning this test can be used) for the duration of the  Covid-19 declaration under Section 564(b)(1) of the Act, 21  U.S.C. section 360bbb-3(b)(1), unless the authorization is  terminated or revoked. Performed at [facility], [address] [place], [place] [address]   Lactic acid, plasma     Status: Abnormal   Collection Time: 11/28/19  8:11 PM  Result Value Ref Range   Lactic Acid, Venous 3.9 (HH) 0.5 - 1.9 mmol/L     Comment: CRITICAL RESULT CALLED TO, READ BACK BY AND VERIFIED WITH ANNIE SMITH @2049  11/28/19 MJU Performed at [facility], [address] [place], [place] [address]   Hepatic  function panel     Status: Abnormal   Collection Time: 11/28/19  8:14 PM  Result Value Ref Range   Total Protein 6.6 6.5 - 8.1 g/dL   Albumin 2.9 (L) 3.5 - 5.0 g/dL   AST 31 15 - 41 U/L   ALT 41 0 - 44 U/L   Alkaline Phosphatase 84 38 - 126 U/L   Total Bilirubin 1.7 (H) 0.3 - 1.2 mg/dL   Bilirubin, Direct 0.5 (H) 0.0 - 0.2 mg/dL   Indirect Bilirubin 1.2 (H) 0.3 - 0.9 mg/dL    Comment: Performed at [facility], [address] [place],  [address]   CT Head Wo Contrast  Result Date: 11/28/2019 CLINICAL DATA:  Weakness, fell, hypotension EXAM: CT HEAD WITHOUT CONTRAST TECHNIQUE: Contiguous axial images were obtained from the base of the skull through the vertex without intravenous contrast. COMPARISON:  11/18/2019 FINDINGS: Brain: Chronic confluent hypodensities throughout the periventricular and subcortical white matter are consistent with chronic ischemic changes. No acute infarct or hemorrhage. Lateral ventricles and midline structures are stable. No acute extra-axial fluid collections. No mass effect. Vascular: No hyperdense vessel or unexpected calcification. Skull: Normal. Negative for fracture or focal lesion. Sinuses/Orbits: No acute finding. Other: None IMPRESSION: 1. Chronic ischemic changes as above. No acute intracranial process. Electronically Signed   By: Randa Ngo M.D.   On: 11/28/2019 20:57   DG Chest Port 1 View  Result Date: 11/28/2019 CLINICAL DATA:  Weakness, fell, hypotensive EXAM: PORTABLE CHEST 1 VIEW COMPARISON:  11/20/2019 FINDINGS: Single frontal view of the chest demonstrates an unremarkable cardiac silhouette. No airspace disease, effusion, or pneumothorax. No acute bony abnormalities. IMPRESSION: 1. No acute process. Electronically Signed   By: Randa Ngo  M.D.   On: 11/28/2019 20:34    Review of Systems  Unable to perform ROS: Dementia    Blood pressure 126/61, pulse 79, temperature 98 F (36.7 C), temperature source Oral, resp. rate (!) 27, height 5\' 9"  (1.753 m), weight 99.2 kg, SpO2 100 %. Physical Exam  Constitutional: He appears well-developed. He is active and cooperative.  Significantly hard of hearing  HENT:  Head: Normocephalic.  Cardiovascular: Normal rate.  Respiratory: Effort normal. No respiratory distress. He has no wheezes.  GI: He exhibits distension. There is abdominal tenderness. There is rebound.  Musculoskeletal:     Right shoulder: Decreased strength.     Cervical back: Normal range of motion and neck supple.  Neurological: He is alert.  Skin: Skin is warm and dry.  Psychiatric: His speech is normal and behavior is normal. Cognition and memory are impaired. He exhibits abnormal remote memory.     Assessment/Plan #1.  Sepsis on admission most likely related to cholelithiasis/pancreatitis. Cholangitis less likely.  Patient was seen initiated on sepsis protocol with IV fluids and broad-spectrum IV antibiotics.  Blood cultures was obtained in the ED prior to antibiotics.  We will follow up with blood cultures.  Blood pressure improved.  We will follow the lactic acid.  #2.  Acute pancreatitis post ERCP on 04/03. Elevated lipase level on admission.  Likely could be a sequelae from ERCP.  CT of the abdomen has been requested and pending.  CT abdomen will help rule out any perforated viscus.  #3.  Sepsis with shock, present on admission-this significantly improved with IV fluids and IV antibiotics.  Blood pressure improved and stable.  Patient will be admitted to stepdown unit.  Continue with IV fluids.  Strict input and output monitoring.  #4. Acute  Kidney Injury-present on admission, likely sequelae from sepsis.  Could be ATN related as a result of hypotension and sepsis.  We will do trial of IV fluids and monitor renal  function improvement.  Hold all nephrotoxic medication.  #5.  Elevated lactic acidosis: Likely due to sepsis.  Will do serial lactic acid monitoring.  Continue with IV fluids.  #6.  Known history of acute cholelithiasis-was deemed not a candidate for surgical intervention on recent admission due to age and comorbidities.  General surgery input may be warranted.  Consider cholecystotomy tube placement.  #7.  Chronic dementia: Patient also has hearing impairment.  We will continue with home medication.  Patient is being followed up by palliative care team at home.  #8.  History of diastolic CHF: No evidence of CHF exacerbation this time.  Chronic bilateral lower extremity edema with venous stasis changes noted.  #9.  Type 2 diabetes mellitus: Insulin sliding scale.  Monitor fingerstick glucose as per protocol.  Hold Metformin.  #10.  Chronic essential hypertension: Due to labile blood pressure, medications were held.  #11. Documented hx of DNR/DNI being followed at home by hospice team.  Attempts to reach wife at this hour has been unsuccessful.  [place], MD 11/28/2019, 11:32 PM

## 2019-11-28 NOTE — ED Triage Notes (Signed)
Pt presents from home via acems with c/o weakness and fall at home. No loc and wife reports pt did not hit head with fall. Pt hypotenisve for ems. 130 HR for ems and cbg 265. 100cc normal saline given by ems. 90% on room air for ems. Pt alert, but hard of hearing.

## 2019-11-28 NOTE — Progress Notes (Signed)
PHARMACY -  BRIEF ANTIBIOTIC NOTE   Pharmacy has received consult(s) for Vancomycin, Cefepime from an ED provider.  The patient's profile has been reviewed for ht/wt/allergies/indication/available labs.    One time order(s) placed for Vancomycin 2 gm IV X 1 and Cefepime 2 gm IV X 1.   Further antibiotics/pharmacy consults should be ordered by admitting physician if indicated.                       Thank you, Deshaun Schou D 11/28/2019  9:05 PM

## 2019-11-29 ENCOUNTER — Inpatient Hospital Stay: Payer: PPO

## 2019-11-29 DIAGNOSIS — E872 Acidosis: Secondary | ICD-10-CM

## 2019-11-29 DIAGNOSIS — I959 Hypotension, unspecified: Secondary | ICD-10-CM | POA: Diagnosis not present

## 2019-11-29 LAB — CBC
HCT: 35.9 % — ABNORMAL LOW (ref 39.0–52.0)
Hemoglobin: 11.4 g/dL — ABNORMAL LOW (ref 13.0–17.0)
MCH: 28.9 pg (ref 26.0–34.0)
MCHC: 31.8 g/dL (ref 30.0–36.0)
MCV: 91.1 fL (ref 80.0–100.0)
Platelets: 144 10*3/uL — ABNORMAL LOW (ref 150–400)
RBC: 3.94 MIL/uL — ABNORMAL LOW (ref 4.22–5.81)
RDW: 14.4 % (ref 11.5–15.5)
WBC: 14.1 10*3/uL — ABNORMAL HIGH (ref 4.0–10.5)
nRBC: 0 % (ref 0.0–0.2)

## 2019-11-29 LAB — COMPREHENSIVE METABOLIC PANEL
ALT: 28 U/L (ref 0–44)
AST: 23 U/L (ref 15–41)
Albumin: 2.8 g/dL — ABNORMAL LOW (ref 3.5–5.0)
Alkaline Phosphatase: 58 U/L (ref 38–126)
Anion gap: 9 (ref 5–15)
BUN: 51 mg/dL — ABNORMAL HIGH (ref 8–23)
CO2: 24 mmol/L (ref 22–32)
Calcium: 7.4 mg/dL — ABNORMAL LOW (ref 8.9–10.3)
Chloride: 105 mmol/L (ref 98–111)
Creatinine, Ser: 1.21 mg/dL (ref 0.61–1.24)
GFR calc Af Amer: 60 mL/min — ABNORMAL LOW (ref >60)
GFR calc non Af Amer: 52 mL/min — ABNORMAL LOW (ref >60)
Glucose, Bld: 173 mg/dL — ABNORMAL HIGH (ref 70–99)
Potassium: 3.1 mmol/L — ABNORMAL LOW (ref 3.5–5.1)
Sodium: 138 mmol/L (ref 135–145)
Total Bilirubin: 1.5 mg/dL — ABNORMAL HIGH (ref 0.3–1.2)
Total Protein: 5.3 g/dL — ABNORMAL LOW (ref 6.5–8.1)

## 2019-11-29 LAB — PROCALCITONIN: Procalcitonin: <0.1 ng/mL

## 2019-11-29 LAB — MRSA PCR SCREENING: MRSA by PCR: NEGATIVE

## 2019-11-29 LAB — PROTIME-INR
INR: 1.2 (ref 0.8–1.2)
Prothrombin Time: 15.3 seconds — ABNORMAL HIGH (ref 11.4–15.2)

## 2019-11-29 LAB — TROPONIN I (HIGH SENSITIVITY)
Troponin I (High Sensitivity): 20 ng/L — ABNORMAL HIGH (ref <18)
Troponin I (High Sensitivity): 18 ng/L — ABNORMAL HIGH (ref <18)

## 2019-11-29 LAB — CORTISOL-AM, BLOOD: Cortisol - AM: 14.7 ug/dL (ref 6.7–22.6)

## 2019-11-29 LAB — GLUCOSE, CAPILLARY: Glucose-Capillary: 156 mg/dL — ABNORMAL HIGH (ref 70–99)

## 2019-11-29 MED ORDER — ALBUMIN HUMAN 25 % IV SOLN
25.0000 g | Freq: Once | INTRAVENOUS | Status: AC
  Administered 2019-11-29: 25 g via INTRAVENOUS
  Filled 2019-11-29: qty 100

## 2019-11-29 MED ORDER — CHLORHEXIDINE GLUCONATE CLOTH 2 % EX PADS
6.0000 | MEDICATED_PAD | Freq: Every day | CUTANEOUS | Status: DC
  Administered 2019-11-29 – 2019-11-30 (×2): 6 via TOPICAL

## 2019-11-29 MED ORDER — SODIUM CHLORIDE 0.9 % IV SOLN
2.0000 g | Freq: Two times a day (BID) | INTRAVENOUS | Status: AC
  Administered 2019-11-29 – 2019-12-01 (×6): 2 g via INTRAVENOUS
  Filled 2019-11-29 (×8): qty 2

## 2019-11-29 MED ORDER — SODIUM CHLORIDE 0.9 % IV BOLUS
500.0000 mL | Freq: Once | INTRAVENOUS | Status: AC
  Administered 2019-11-29: 11:00:00 500 mL via INTRAVENOUS

## 2019-11-29 MED ORDER — LACTATED RINGERS IV BOLUS
1000.0000 mL | Freq: Once | INTRAVENOUS | Status: AC
  Administered 2019-11-29: 1000 mL via INTRAVENOUS

## 2019-11-29 NOTE — TOC Initial Note (Addendum)
Transition of Care [facility]) - Initial/Assessment Note    Patient Details  Name: David Morgan MRN: [id number] Date of Birth: 1925-06-28  Transition of [facility]) CM/SW Contact:    Magnus Ivan, LCSW Phone Number: 11/29/2019, 1:31 PM  Clinical Narrative:          CSW called [name] with [facility] as it appears patient was recently discharged home with [facility] services through them. [name] reported patient had been approved for home health services, but they had not had a chance to start services with him yet since he was back at the hospital. [name] reported patient's wife was asking about long term placement. [name] said that if patient does discharge back home, they could still do home health services with him.  CSW called and spoke with patient's spouse [name]. [name] reported "everything is set up" for patient to go to [facility] for long-term care when he is discharged. [name] said that patient's PCP Dr. [name] set this up. [name] said patient has [employer] and she is planning on applying for long-term Medicaid once she pays his current medical bills as she thinks he will qualify.   CSW called [name] with [facility]. [name] reported it is not set up for patient to come there for long-term care. [name] stated [facility] assessed patient for possible placement for rehab during his last hospital stay, but nothing is set up at this time. [name] reported patient will need the usual SNF work up and they can see if they can accept him for long-term care. CSW will ask MD to submit PT/OT evaluations.   CSW will continue to follow.          Expected Discharge Plan: Skilled Nursing Facility Barriers to Discharge: Continued Medical Work up   Patient Goals and CMS Choice        Expected Discharge Plan and Services Expected Discharge Plan: [place] arrangements for the past 2 months: [place] Agency: [place] Date [place]: 11/29/19   Representative spoke with at [place]: [name]  Prior Living Arrangements/Services Living arrangements for the past 2 months: [place] Lives with:: Spouse          Need for Family Participation in Patient Care: Yes (Comment) Care giver support system in place?: Yes (comment) Current home services: DME Criminal Activity/Legal Involvement Pertinent to Current Situation/Hospitalization: No - Comment as needed  Activities of Daily Living Home Assistive Devices/Equipment: Eyeglasses, Hearing aid ADL Screening (condition at time of admission) Patient's cognitive ability adequate to safely complete daily activities?: Yes Is the patient deaf or have difficulty hearing?: Yes Does the patient have difficulty seeing, even when wearing glasses/contacts?: No Does the patient have difficulty concentrating, remembering, or making decisions?: Yes Patient able to express need for assistance with ADLs?: Yes Does the patient have difficulty dressing or bathing?: Yes Independently performs ADLs?: No Communication: Independent with device (comment) Dressing (OT): Needs assistance, Independent with device (comment) Is this a change from baseline?: Pre-admission baseline Grooming: Independent with device (comment) Feeding: Independent Bathing: Independent with device (comment), Needs assistance Is this a change from baseline?: Pre-admission baseline Toileting: Independent In/Out Bed: Independent Walks in Home: Needs assistance, Independent with device (comment) Is this a change  from baseline?: Pre-admission baseline Does the patient have difficulty walking or climbing stairs?: Yes Weakness of Legs: Right Weakness of Arms/Hands: Both  Permission Sought/Granted Permission sought to share information with : Investment banker, corporate granted to share info w AGENCY: [facility];  [name]        Emotional Assessment         Alcohol / Substance Use: Not Applicable Psych Involvement: No (comment)  Admission diagnosis:  Sepsis associated hypotension (HCC) [A41.9, I95.9] Sepsis, due to unspecified organism, unspecified whether acute organ dysfunction present [facility]) [A41.9] Patient Active Problem List   Diagnosis Date Noted  . Sepsis associated hypotension ([place]) 11/28/2019  . Acute diastolic CHF (congestive heart failure) ([place]) 11/21/2019  . Pulmonary vascular congestion 11/20/2019  . Choledocholithiasis   . Dementia without behavioral disturbance ([place]) 11/19/2019  . Accident due to mechanical fall without injury 11/19/2019  . Hyperglycemia due to type 2 diabetes mellitus ([place]) 11/19/2019  . Leukocytosis 11/19/2019  . AKI (acute kidney injury) ([place]) 11/19/2019  . Troponin I above reference range 11/19/2019  . COPD with acute exacerbation ([place]) 11/19/2019  . Severe sepsis ([place]) 11/19/2019  . Elevated liver enzymes 11/18/2019  . Cholelithiasis 11/18/2019  . Pressure injury of skin 12/22/2017  . Back pain 12/21/2017  . History of diverticulitis 05/02/2016  . Difficulty walking 02/04/2016  . Tremor 02/04/2016  . Weakness 02/04/2016  . Disease of thyroid gland 07/10/2015  . BP (high blood pressure) 07/10/2015  . HLD (hyperlipidemia) 07/10/2015  . BPH (benign prostatic hyperplasia) 08/02/2014   PCP:  [name], MD Pharmacy:   [facility] [address] [place] [address] Phone: [phone] Fax: [place], [place] [address] [place] [address] Phone: [phone] Fax: [place], [place] [address] Phone: [phone] Fax: [phone]     Social Determinants of Health (SDOH) Interventions    Readmission Risk Interventions No flowsheet  data found.

## 2019-11-29 NOTE — ED Notes (Signed)
Pt to CT

## 2019-11-29 NOTE — Evaluation (Signed)
Physical Therapy Evaluation Patient Details Name: David Morgan MRN: [id number] DOB: Aug 06, 1925 Today's Date: 11/29/2019   History of Present Illness  Pt is admitted for sepsis secondary to hypotension with complaints of AMS and falls. History includes COPD, HTN, HLD, and recent hospital stay secodnary to acute choleclithiasis with ERCP. Pt was declined for SNF, transitioned home and now readmitted.  Clinical Impression  Pt is a pleasant 86 year old male who was admitted for sepsis secondary to hypotension. BP at beginning of session at 107/60. HR at 62 bpm. Pt needs max assist for bed mobility attempts. Unable to fully transition to EOB. Pt very confused and HOH. Unsure of accurate history/PLOF. Pt demonstrates deficits with strength (B LE), mobility/balance. Reports recent fall where wife didn't have WC close enough for transfer. Would benefit from skilled PT to address above deficits and promote optimal return to PLOF; recommend transition to STR upon discharge from acute hospitalization.     Follow Up Recommendations SNF    Equipment Recommendations  Hospital bed(hoyer lift)    Recommendations for Other Services       Precautions / Restrictions Precautions Precautions: Fall Restrictions Weight Bearing Restrictions: No      Mobility  Bed Mobility Overal bed mobility: Needs Assistance Bed Mobility: Supine to Sit     Supine to sit: Max assist     General bed mobility comments: initially  attempted with B UE however no initiation with B LE. Max assist and unable to fully transition to side. Further mobility deferred at this time  Transfers                 General transfer comment: unable this date  Ambulation/Gait                Stairs            Wheelchair Mobility    Modified Rankin (Stroke Patients Only)       Balance Overall balance assessment: Needs assistance                                           Pertinent  Vitals/Pain Pain Assessment: Faces Faces Pain Scale: Hurts little more Pain Location: abdomen Pain Descriptors / Indicators: Discomfort;Grimacing;Guarding Pain Intervention(s): Limited activity within patient's tolerance    Home Living Family/patient expects to be discharged to:: Private residence Living Arrangements: Spouse/significant other Available Help at Discharge: Family Type of Home: House Home Access: Level entry     Home Layout: One level Home Equipment: Environmental consultant - 2 wheels;Wheelchair - manual Additional Comments: pt very HOH and has difficulty following commands.     Prior Function Level of Independence: Needs assistance   Gait / Transfers Assistance Needed: used WC as main use of transportation. Able to use feet to propel as he reports his wife isn't strong enough to push him  ADL's / Homemaking Assistance Needed: reports he hasn't ambulated in several months        Hand Dominance        Extremity/Trunk Assessment   Upper Extremity Assessment Upper Extremity Assessment: Overall WFL for tasks assessed    Lower Extremity Assessment Lower Extremity Assessment: Generalized weakness(B LE grossly 3/5)       Communication   Communication: HOH  Cognition Arousal/Alertness: Awake/alert Behavior During Therapy: WFL for tasks assessed/performed Overall Cognitive Status: Impaired/Different from baseline  General Comments: confused to situation/date/PLOF. Poor historian      General Comments      Exercises Other Exercises Other Exercises: supine ther-ex performed including B AP, quad sets, SLRs, and hip abd/add. Max assist required. 10 reps with safe technique   Assessment/Plan    PT Assessment Patient needs continued PT services  PT Problem List Decreased strength;Decreased activity tolerance;Decreased balance;Decreased mobility;Decreased coordination;Decreased cognition;Decreased knowledge of use of  DME;Decreased safety awareness;Decreased knowledge of precautions       PT Treatment Interventions DME instruction;Gait training;Functional mobility training;Therapeutic activities;Therapeutic exercise;Balance training;Neuromuscular re-education;Cognitive remediation;Patient/family education    PT Goals (Current goals can be found in the Care Plan section)  Acute Rehab PT Goals Patient Stated Goal: wants to go home PT Goal Formulation: With patient Time For Goal Achievement: 12/13/19 Potential to Achieve Goals: Fair    Frequency Min 2X/week   Barriers to discharge        Co-evaluation               AM-PAC PT "6 Clicks" Mobility  Outcome Measure Help needed turning from your back to your side while in a flat bed without using bedrails?: Total Help needed moving from lying on your back to sitting on the side of a flat bed without using bedrails?: Total Help needed moving to and from a bed to a chair (including a wheelchair)?: Total Help needed standing up from a chair using your arms (e.g., wheelchair or bedside chair)?: Total Help needed to walk in hospital room?: Total Help needed climbing 3-5 steps with a railing? : Total 6 Click Score: 6    End of Session   Activity Tolerance: Patient tolerated treatment well Patient left: in bed;with bed alarm set Nurse Communication: Mobility status PT Visit Diagnosis: Other abnormalities of gait and mobility (R26.89);Unsteadiness on feet (R26.81);Muscle weakness (generalized) (M62.81)    Time: [id number] PT Time Calculation (min) (ACUTE ONLY): 12 min   Charges:   PT Evaluation $PT Eval High Complexity: 1 High          Greggory Stallion, PT, DPT [phone]   Elzy Tomasello 11/29/2019, 5:17 PM

## 2019-11-29 NOTE — Plan of Care (Signed)
Pt was admitted this morning from ED to room CCU18, pt is A/O x3, pt oriented to room and call light. Pt have denies any pain since admission to the unit. Pt received albumin given. BP improved this morning.

## 2019-11-29 NOTE — Progress Notes (Signed)
PROGRESS NOTE    Patient: David Morgan                            PCP: [name], MD                    DOB: 1925-03-12            DOA: 11/28/2019 [id number]             DOS: 11/29/2019, 2:07 PM   LOS: 1 day   Date of Service: The patient was seen and examined on 11/29/2019  Subjective:   The patient was seen in critical care setting.  Hard of hearing but awake alert following commands.  Denies any chest pain or shortness of breath.   Noted for hypotension this morning blood pressure as low as 73/51, 84/66  Brief Narrative:   David Sian Johnstonis a 86 y.o.M w PMH/o COPD, HTN, HLD, hypothyroidism who was recently admitted to [facility] main campus on account of acute cholelithiasis and had an ERCP and sphincterotomy done.  Patient was subsequently discharged back home with palliative care follow-up barely a couple of days ago.  Admitted for altered mental status, subsequent fall.  Patient was found lethargic, hypotensive elevated lactic acid... Thought to be septic.  Unknown etiology.   Chest x-ray and urinalysis was unremarkable.  CT of the abdomen there for any acute changes. CT of the head negative for any acute intracranial changes  Patient has already been initiated on cefepime and metronidazole.     Assessment & Plan:   Active Problems:   Sepsis associated hypotension ([place])   Sepsis on admission -On admission patient met the criteria for sepsis due to altered mental status, hypotension, lactic acidosis, leukocytosis  -Possibly related to cholelithiasis/pancreatitis. Cholangitis less likely.  -Based on sepsis protocol, IV fluids and broad-spectrum antibiotic initiated -We will follow with cultures >>>  He remains hypotensive in ICU setting -we will continue with IV fluid resuscitation -Lactic acidosis improving 3.9 >> 1.9 -WBC 16.1 >> 14.1 -Creatinine 1.84 >>> 1.21  . Acute pancreatitis post ERCP on 04/03. - Elevated lipase level on admission....  104 Likely could be a sequelae from ERCP.   CT of the abdomen has been requested and pending.  CT abdomen will help rule out any perforated viscus.  Sepsis with shock, present on admission -On admission hemodynamically unstable, remained hypotensive, continued response to IV fluid resuscitation, broad-spectrum IV antibiotics Critical care team consulted --appreciate their input and monitoring Blood pressure continues to improve Continue with IV fluids.  Strict input and output monitoring.  Acute Kidney Injury  -Present on admission likely due to sepsis,  -Also ATN which may have worsened by hypotension and sepsis -We will continue gentle IV fluid hydration monitoring kidney function closely -Creatinine improving -Creatinine 1.84 >> 1.21  - Hold all nephrotoxic medication.   Elevated lactic acidosis: -Due to sepsis, will continue monitoring lactic acid, gentle IV fluid hydration     Known history of acute cholelithiasis -Apparently on last admission patient was evaluated deemed not stable or appropriate for surgical intervention due to comorbidities and age.  General surgery input may be warranted if his condition gets any worse CT abdomen pelvis does not reveal overt abnormality -Currently patient remains not a surgical candidate as meets the sepsis criteria.   Chronic dementia: Patient also has hearing impairment.  We will continue with home medication.  Patient is being followed up by  palliative care team at home.  History of diastolic CHF:  No evidence of CHF exacerbation this time.   Chronic bilateral lower extremity edema with venous stasis changes noted.  Type 2 diabetes mellitus:  -Monitoring blood sugar with CBG QA CHS, SSI  Hold Metformin.  Hypokalemia/hypomagnesemia -Repleting accordingly -Monitoring closely  h/o Chronic essential hypertension:  Currently patient remains hypotensive -With holding all BP meds    Nutritional status:          Cultures; 11/29/2019 Influenza A/B, SARS-CoV-2 negative Blood cultures x2 >>  Antimicrobials: 11/29/2019 IV Flagyl, cefepime >>  -------------------------------------------------------------------------------------------------------------------------------- DVT prophylaxis: SCD/Compression stockings and Heparin SQ Code Status:   Code Status: DNR/DNI  Family Communication:  No family member present at bedside- attempt will be made to update daily The above findings and plan of care has been discussed with patient in detail,  they expressed understanding and agreement of above. -Patient is a hard of hearing, with dementia, poor insight difficult to draw advance care planning at this time Palliative care team consult.  Social worker consult  Admission status:   Status is: Inpatient  Remains inpatient appropriate because:Hemodynamically unstable   Dispo: The patient is from: Home              Anticipated d/c is to: To be determined home with palliative versus SNF palliative versus hospice              Anticipated d/c date is: > 3 days              Patient currently is not medically stable to d/c.  Due to meeting sepsis criteria, hypotensive, hemodynamically unstable         Consultants: Intensive care team, General surgery   Procedures:   CT abdomen/pelvis FINDINGS: Lower chest: Lung bases demonstrate no acute consolidation or effusion. Mild cardiomegaly. Coronary vascular calcification and mitral calcification. Small hiatal hernia  Hepatobiliary: No focal hepatic abnormality. No biliary distention. Stones within the gallbladder. Gallbladder wall appears slightly indistinct but there is motion.  Pancreas: Unremarkable. No pancreatic ductal dilatation or surrounding inflammatory changes.  Spleen: Normal in size without focal abnormality.  Adrenals/Urinary Tract: Adrenal glands are normal. Kidneys show no hydronephrosis.  Stomach/Bowel: The stomach is  nonenlarged. Visible bowel shows no thickening  Vascular/Lymphatic: Extensive aortic atherosclerosis. Mild aneurysmal dilatation of the distal aorta at the bifurcation with possible displaced wall calcification, no change. No suspicious nodes in the upper abdomen  Other: No free air in the upper abdomen  Musculoskeletal: Chronic compression deformity with treatment at L3.  IMPRESSION: 1. Negative for free air within the upper abdomen. 2. Gallstones. Gallbladder wall is slightly indistinct suggesting possible mild wall thickening or subtle inflammation however the study is degraded by patient motion.  Antimicrobials:  Anti-infectives (From admission, onward)   Start     Dose/Rate Route Frequency Ordered Stop   11/29/19 1000  ceFEPIme (MAXIPIME) 2 g in sodium chloride 0.9 % 100 mL IVPB     2 g 200 mL/hr over 30 Minutes Intravenous Every 12 hours 11/29/19 0158     11/29/19 0000  ceFEPIme (MAXIPIME) 2 g in sodium chloride 0.9 % 100 mL IVPB  Status:  Discontinued     2 g 200 mL/hr over 30 Minutes Intravenous  Once 11/28/19 2359 11/29/19 0006   11/29/19 0000  metroNIDAZOLE (FLAGYL) IVPB 500 mg     500 mg 100 mL/hr over 60 Minutes Intravenous Every 8 hours 11/28/19 2359     11/28/19 2115  vancomycin (VANCOREADY) IVPB  2000 mg/400 mL     2,000 mg 200 mL/hr over 120 Minutes Intravenous  Once 11/28/19 2105     11/28/19 2100  ceFEPIme (MAXIPIME) 2 g in sodium chloride 0.9 % 100 mL IVPB     2 g 200 mL/hr over 30 Minutes Intravenous  Once 11/28/19 2051 11/28/19 2201   11/28/19 2100  vancomycin (VANCOCIN) IVPB 1000 mg/200 mL premix  Status:  Discontinued     1,000 mg 200 mL/hr over 60 Minutes Intravenous  Once 11/28/19 2051 11/28/19 2213       Medication:  . aspirin EC  81 mg Oral Daily  . carvedilol  3.125 mg Oral BID WC  . Chlorhexidine Gluconate Cloth  6 each Topical Daily  . donepezil  5 mg Oral Daily  . heparin  5,000 Units Subcutaneous Q8H  . levothyroxine  112 mcg Oral  QAC breakfast  . multivitamin with minerals  1 tablet Oral Daily  . tamsulosin  0.4 mg Oral QPM    acetaminophen **OR** acetaminophen, senna-docusate   Objective:   Vitals:   11/29/19 0800 11/29/19 0816 11/29/19 0900 11/29/19 1000  BP: (!) 84/66 (!) 84/66 (!) 101/50 115/74  Pulse: 98 67 (!) 58 (!) 33  Resp: 17  18 17   Temp:      TempSrc:      SpO2: 97%  97% 96%  Weight:      Height:        Intake/Output Summary (Last 24 hours) at 11/29/2019 1407 Last data filed at 11/29/2019 0900 Gross per 24 hour  Intake 2455.91 ml  Output --  Net 2455.91 ml   Filed Weights   11/28/19 2007  Weight: 99.2 kg     Examination:   Physical Exam  Constitution:  Alert, cooperative, no distress,  Appears calm and comfortable  Psychiatric: Normal and stable mood and affect, cognition intact,   HEENT: Normocephalic, PERRL, otherwise with in Normal limits  Chest:Chest symmetric Cardio vascular:  S1/S2, RRR, No murmure, No Rubs or Gallops  pulmonary: Clear to auscultation bilaterally, respirations unlabored, negative wheezes / crackles Abdomen: Soft, non-tender, non-distended, bowel sounds,no masses, no organomegaly Muscular skeletal:  Generalized weaknesses, Limited exam - in bed, able to move all 4 extremities, Normal strength,  Neuro: CNII-XII intact. , normal motor and sensation, reflexes intact  Extremities: No pitting edema lower extremities, +2 pulses  Skin: Dry, warm to touch, negative for any Rashes, No open wounds Wounds: per nursing documentation     LABs:  CBC Latest Ref Rng & Units 11/29/2019 11/28/2019 11/25/2019  WBC 4.0 - 10.5 K/uL 14.1(H) 16.1(H) 10.5  Hemoglobin 13.0 - 17.0 g/dL 11.4(L) 14.7 15.7  Hematocrit 39.0 - 52.0 % 35.9(L) 46.0 50.3  Platelets 150 - 400 K/uL 144(L) 196 193   CMP Latest Ref Rng & Units 11/29/2019 11/28/2019 11/25/2019  Glucose 70 - 99 mg/dL 173(H) 225(H) 173(H)  BUN 8 - 23 mg/dL 51(H) 60(H) 30(H)  Creatinine 0.61 - 1.24 mg/dL 1.21 1.84(H) 0.94  Sodium  135 - 145 mmol/L 138 133(L) 144  Potassium 3.5 - 5.1 mmol/L 3.1(L) 3.4(L) 3.8  Chloride 98 - 111 mmol/L 105 95(L) 103  CO2 22 - 32 mmol/L 24 25 29   Calcium 8.9 - 10.3 mg/dL 7.4(L) 8.3(L) 8.4(L)  Total Protein 6.5 - 8.1 g/dL 5.3(L) 6.6 6.5  Total Bilirubin 0.3 - 1.2 mg/dL 1.5(H) 1.7(H) 2.0(H)  Alkaline Phos 38 - 126 U/L 58 84 111  AST 15 - 41 U/L 23 31 53(H)  ALT 0 - 44 U/L  28 41 83(H)        SIGNED: Deatra James, MD, FACP, FHM. [facility],  Pager [phone] ([email or web address] to [email or web address])  If 7PM-7AM, please contact night-coverage [email or web address] [facility] 11/29/2019, 2:07 PM

## 2019-11-29 NOTE — Progress Notes (Signed)
Pharmacy Antibiotic Note  David Morgan is a 86 y.o. male admitted on 11/28/2019 with sepsis.  Pharmacy has been consulted for Cefepime dosing.  Plan: Cefepime 2gm IV q12hrs  Height: 5\' 9"  (175.3 cm) Weight: 99.2 kg (218 lb 11.1 oz) IBW/kg (Calculated) : 70.7  Temp (24hrs), Avg:98 F (36.7 C), Min:98 F (36.7 C), Max:98 F (36.7 C)  Recent Labs  Lab 11/22/19 0255 11/23/19 0233 11/24/19 0359 11/25/19 0258 11/28/19 2004 11/28/19 2011 11/28/19 2324  WBC 7.9 8.8  --  10.5 16.1*  --   --   CREATININE 1.13 1.02 1.03 0.94 1.84*  --   --   LATICACIDVEN  --   --   --   --   --  3.9* 1.9    Estimated Creatinine Clearance: 29.7 mL/min (A) (by C-G formula based on SCr of 1.84 mg/dL (H)).    Allergies  Allergen Reactions  . Adhesive [Tape] Rash  . Augmentin [Amoxicillin-Pot Clavulanate] Itching, Rash and Other (See Comments)    Has patient had a PCN reaction causing immediate rash, facial/tongue/throat swelling, SOB or lightheadedness with hypotension: No Has patient had a PCN reaction causing severe rash involving mucus membranes or skin necrosis: No Has patient had a PCN reaction that required hospitalization No Has patient had a PCN reaction occurring within the last 10 years: No If all of the above answers are "NO", then may proceed with Cephalosporin use.     Antimicrobials this admission:   >>    >>   Dose adjustments this admission:   Microbiology results:  BCx:   UCx:    Sputum:    MRSA PCR:   Thank you for allowing pharmacy to be a part of this patient's care.  Hart Robinsons A 11/29/2019 1:59 AM

## 2019-11-29 NOTE — Consult Note (Signed)
Subjective:   CC: cholecystitis?  HPI:  David Morgan is a 86 y.o. male who is consulted by [facility] for evaluation of above cc.  Symptoms were first noted 2 days ago per chart report due to patient inability to provide history secondary to [facility] and also not clear answers.   Per chart review, patient was recently admitted to [facility] for cholecystitis and cholangitis status post ERCP.  According to the discharge summary, surgery to remove gallbladder was not considered due to patient comorbidities as well as the absence of gallstones at that time.  He returned to our emergency department yesterday due to a fall and tachycardic hypotensive the noted when the EMS arrived at his house.  Work-up concerning for elevated white count as well as lactic acid concerning for sepsis.  No obvious etiology was noted on the CT scan or further work-up.  Today the patient does not have any specific complaints, and could not recall his fall.   Past Medical History:  has a past medical history of Arthritis, Cancer ([place]), Claudication ([place]), Diverticulitis, Emphysema/COPD ([place]), History of bladder cancer, History of carotid artery stenosis, History of TIA (transient ischemic attack), Hyperlipidemia, Hypertension, Hypothyroidism, Lesion of bladder, Mild asthma, Nocturia, Peripheral vascular disease ([place]), Short of breath on exertion, Urgency of urination, and Wears glasses.  Past Surgical History:  has a past surgical history that includes Transurethral resection of bladder tumor (1990); Total knee arthroplasty (Right, 2004); Carotid endarterectomy (Right, 2005); Carpal tunnel release (Bilateral, 2002  &  2007); RIGHT SHOULDER  SURGERY (2005); ORIF hip fracture (Left, 2008); Umbilical hernia repair ( 2009  &  2011); Inguinal hernia repair (YRS AGO); Cataract extraction w/ intraocular lens  implant, bilateral; Cystoscopy with biopsy (N/A, 12/26/2013); Cystoscopy w/ retrogrades (Bilateral, 12/26/2013); laparoscopy  (06/28/2015); Laparoscopic lysis of adhesions (06/28/2015); laparotomy (N/A, 09/03/2015); ERCP (N/A, 11/19/2019); sphincterotomy (11/19/2019); and removal of stones (11/19/2019).  Family History: family history includes Alzheimer's disease in his mother; Hypertension in his father.  Social History:  reports that he quit smoking about 25 years ago. His smoking use included cigarettes. He has a 40.00 pack-year smoking history. He has never used smokeless tobacco. He reports that he does not drink alcohol or use drugs.  Current Medications:  Medications Prior to Admission  Medication Sig Dispense Refill  . carvedilol (COREG) 3.125 MG tablet Take 3.125 mg by mouth 2 (two) times daily with a meal.    . donepezil (ARICEPT) 5 MG tablet Take 5 mg by mouth daily.    . hydrochlorothiazide (HYDRODIURIL) 12.5 MG tablet Take 12.5 mg by mouth daily.    [name] ketoconazole (NIZORAL) 2 % cream Apply 1-2 application topically daily.     [name] levothyroxine (SYNTHROID, LEVOTHROID) 112 MCG tablet Take 112 mcg by mouth daily before breakfast.    . losartan (COZAAR) 50 MG tablet Take 50 mg by mouth daily.     . metFORMIN (GLUCOPHAGE) 500 MG tablet Take 500 mg by mouth daily.    . Multiple Vitamin (MULTI-VITAMINS) TABS Take 1 tablet by mouth daily.    . tamsulosin (FLOMAX) 0.4 MG CAPS capsule Take 0.4 mg by mouth every evening.     . ciprofloxacin (CIPRO) 500 MG tablet Take 1 tablet (500 mg total) by mouth 2 (two) times daily. 8 tablet 0  . metroNIDAZOLE (FLAGYL) 500 MG tablet Take 1 tablet (500 mg total) by mouth every 8 (eight) hours. 12 tablet 0    Allergies:  Allergies as of 11/28/2019 - Review Complete 11/28/2019  Allergen  Reaction Noted  . Adhesive [tape] Rash 12/21/2013  . Augmentin [amoxicillin-pot clavulanate] Itching, Rash, and Other (See Comments) 12/21/2013    ROS:  Unable to obtain secondary to pension mentation    Objective:     BP 107/60   Pulse 62   Temp 98.7 F (37.1 C) (Oral)   Resp 18   Ht 5'  9" (1.753 m)   Wt 99.2 kg   SpO2 97%   BMI 32.30 kg/m    Constitutional :  alert, cooperative, appears stated age and no distress  Lymphatics/Throat:  no asymmetry, masses, or scars  Respiratory:  clear to auscultation bilaterally  Cardiovascular:  regular rate and rhythm  Gastrointestinal: Soft, no guarding, slight distended appearance with tenderness to palpation in all aspects, however this was not a consistent finding..   Musculoskeletal: Steady movement  Skin: Cool and moist, tenderness to palpation noted in several areas throughout his body including his right chest wall right thigh left lower extremity.  Psychiatric: Normal affect, non-agitated, not confused       LABS:  CMP Latest Ref Rng & Units 11/29/2019 11/28/2019 11/25/2019  Glucose 70 - 99 mg/dL 173(H) 225(H) 173(H)  BUN 8 - 23 mg/dL 51(H) 60(H) 30(H)  Creatinine 0.61 - 1.24 mg/dL 1.21 1.84(H) 0.94  Sodium 135 - 145 mmol/L 138 133(L) 144  Potassium 3.5 - 5.1 mmol/L 3.1(L) 3.4(L) 3.8  Chloride 98 - 111 mmol/L 105 95(L) 103  CO2 22 - 32 mmol/L 24 25 29   Calcium 8.9 - 10.3 mg/dL 7.4(L) 8.3(L) 8.4(L)  Total Protein 6.5 - 8.1 g/dL 5.3(L) 6.6 6.5  Total Bilirubin 0.3 - 1.2 mg/dL 1.5(H) 1.7(H) 2.0(H)  Alkaline Phos 38 - 126 U/L 58 84 111  AST 15 - 41 U/L 23 31 53(H)  ALT 0 - 44 U/L 28 41 83(H)   CBC Latest Ref Rng & Units 11/29/2019 11/28/2019 11/25/2019  WBC 4.0 - 10.5 K/uL 14.1(H) 16.1(H) 10.5  Hemoglobin 13.0 - 17.0 g/dL 11.4(L) 14.7 15.7  Hematocrit 39.0 - 52.0 % 35.9(L) 46.0 50.3  Platelets 150 - 400 K/uL 144(L) 196 193     RADS: CLINICAL DATA:  Vomiting and weakness  EXAM: CT ABDOMEN WITHOUT CONTRAST  TECHNIQUE: Multidetector CT imaging of the abdomen was performed following the standard protocol without IV contrast.  COMPARISON:  Chest x-ray 11/28/2019, MRI 11/18/2019  FINDINGS: Lower chest: Lung bases demonstrate no acute consolidation or effusion. Mild cardiomegaly. Coronary vascular calcification  and mitral calcification. Small hiatal hernia  Hepatobiliary: No focal hepatic abnormality. No biliary distention. Stones within the gallbladder. Gallbladder wall appears slightly indistinct but there is motion.  Pancreas: Unremarkable. No pancreatic ductal dilatation or surrounding inflammatory changes.  Spleen: Normal in size without focal abnormality.  Adrenals/Urinary Tract: Adrenal glands are normal. Kidneys show no hydronephrosis.  Stomach/Bowel: The stomach is nonenlarged. Visible bowel shows no thickening  Vascular/Lymphatic: Extensive aortic atherosclerosis. Mild aneurysmal dilatation of the distal aorta at the bifurcation with possible displaced wall calcification, no change. No suspicious nodes in the upper abdomen  Other: No free air in the upper abdomen  Musculoskeletal: Chronic compression deformity with treatment at L3.  IMPRESSION: 1. Negative for free air within the upper abdomen. 2. Gallstones. Gallbladder wall is slightly indistinct suggesting possible mild wall thickening or subtle inflammation however the study is degraded by patient motion.   Electronically Signed   By: Donavan Foil M.D.   On: 11/29/2019 02:15 Assessment:      Elevated white count, lactic acidosis, all improving after overnight  of IV fluid resuscitation and antibiotics.  Plan:    Patient exam at this time is not consistent with persistent gallbladder issues due to the tenderness to palpation at all aspects of his body including beyond his abdomen at the time of exam.  At the same time, patient is a poor historian and his report of tenderness to palpation may not be as reliable due to the consistencies of repeated palpations not showing persistent tenderness.  Attempted to get in touch with wife whose number was listed in the chart but was unable to reach her.  With his DNR status, advanced age, and improving numbers over a short time span, I am hesitant to proceed with any  sort of surgical intervention at this time.  My initial recommendation is to see if he continues to improve with current management prior to considering additional procedures such as a cholecystostomy tube for possible persistent cholecystitis.  Although his lipase was elevated on admission, there was not elevated above 3 times the normal range limit, and his symptomatology was not typical for pancreatitis, nor did the CT scan show any sign of pancreatitis.  Therefore, I doubt there is any aspect of pancreatitis at this point.  Further care at present per hospitalist team. surgery will continue to follow.

## 2019-11-29 NOTE — Consult Note (Signed)
Reason for Consult: Transient hypotension. Referring Physician: Skipper Cliche, MD  David Morgan is an 86 y.o. male.  HPI: Patient is a 86 year old male he cannot provide history due to his severe hearing deficit.  His answers are very vague.  His only complaint is that of lower abdominal crampy pain.  He states that the pain is actually getting somewhat better.  We were asked to evaluate the patient because he had a transient drop in blood pressure which has resolved with IV fluids and albumin.  He has not required pressors.  He is currently hemodynamically stable and has not had any other episode of hypotension.  He is DNR and has palliative care follow-up.  I have reviewed his records and it appears that he was recently at [facility] for cholecystitis and cholangitis status post ERCP.  He had a sphincterotomy done at that time.  He apparently had an episode of altered mental status at home and had a fall.  He was brought to the emergency room and Actiq acid was noted to be 3.9.  Procalcitonin however is less than 0.1.  High-sensitivity troponins have been only slightly elevated.  CT abdomen and pelvis showed gallstones and slightly thickened gallbladder wall however there was significant motion artifact.  The patient responded very well to modest amount of IV fluid resuscitation and albumin.  He has not required pressors.  He is awake and alert though he appears pleasantly befuddled I suspect due to mild cognitive impairment and his significant hearing deficit.  The patient does not endorse any chest pain, shortness of breath, lower extremity pain, his only complaint is that of crampy abdominal pain he points to his lower abdomen.  He does have a history of diverticulitis.    Past Medical History:  Diagnosis Date  . Arthritis   . Cancer ([place])   . Claudication ([place])   . Diverticulitis   . Emphysema/COPD ([place])   . History of bladder cancer   . History of carotid artery stenosis    . History of TIA (transient ischemic attack)    2012--  NO RESIDUAL (PER SCAN HAD A PREVIOUS TIA BEFORE 2012)  . Hyperlipidemia   . Hypertension   . Hypothyroidism   . Lesion of bladder   . Mild asthma    NO INHALER  . Nocturia   . Peripheral vascular disease ([place])   . Short of breath on exertion   . Urgency of urination   . Wears glasses     Past Surgical History:  Procedure Laterality Date  . CAROTID ENDARTERECTOMY Right 2005  . CARPAL TUNNEL RELEASE Bilateral 2002  &  2007  . CATARACT EXTRACTION W/ INTRAOCULAR LENS  IMPLANT, BILATERAL    . CYSTOSCOPY W/ RETROGRADES Bilateral 12/26/2013   Procedure: BILATERAL RETROGRADE PYELOGRAM;  Surgeon: Claybon Jabs, MD;  Location[facility];  Service: Urology;  Laterality: Bilateral;  . CYSTOSCOPY WITH BIOPSY N/A 12/26/2013   Procedure: CYSTOSCOPY WITH BLADDER BIOPSY;  Surgeon: Claybon Jabs, MD;  Location: [facility];  Service: Urology;  Laterality: N/A;  . ERCP N/A 11/19/2019   Procedure: ENDOSCOPIC RETROGRADE CHOLANGIOPANCREATOGRAPHY (ERCP);  Surgeon: Gatha Mayer, MD;  Location: [facility] ENDOSCOPY;  Service: Endoscopy;  Laterality: N/A;  . INGUINAL HERNIA REPAIR  YRS AGO  . LAPAROSCOPIC LYSIS OF ADHESIONS  06/28/2015   Procedure: LAPAROSCOPIC LYSIS OF ADHESIONS;  Surgeon: Clayburn Pert, MD;  Location: [facility] ORS;  Service: General;;  . LAPAROSCOPY  06/28/2015   Procedure:  LAPAROSCOPY DIAGNOSTIC;  Surgeon: Clayburn Pert, MD;  Location: [facility] ORS;  Service: General;;  . LAPAROTOMY N/A 09/03/2015   Procedure: umbilical hernia repair with mesh;  Surgeon: Hubbard Robinson, MD;  Location: [facility] ORS;  Service: General;  Laterality: N/A;  . ORIF HIP FRACTURE Left 2008   RETAINED HARDWARE  . REMOVAL OF STONES  11/19/2019   Procedure: REMOVAL OF STONES;  Surgeon: Gatha Mayer, MD;  Location: [facility] ENDOSCOPY;  Service: Endoscopy;;  . RIGHT SHOULDER  SURGERY  2005  . SPHINCTEROTOMY  11/19/2019   Procedure:  SPHINCTEROTOMY;  Surgeon: Gatha Mayer, MD;  Location: [facility] ENDOSCOPY;  Service: Endoscopy;;  . TOTAL KNEE ARTHROPLASTY Right 2004  . TRANSURETHRAL RESECTION OF BLADDER TUMOR  1990  . UMBILICAL HERNIA REPAIR   2009  &  2011    Family History  Problem Relation Age of Onset  . Hypertension Father   . Alzheimer's disease Mother     Social History:  reports that he quit smoking about 25 years ago. His smoking use included cigarettes. He has a 40.00 pack-year smoking history. He has never used smokeless tobacco. He reports that he does not drink alcohol or use drugs.  Allergies:  Allergies  Allergen Reactions  . Adhesive [Tape] Rash  . Augmentin [Amoxicillin-Pot Clavulanate] Itching, Rash and Other (See Comments)    Has patient had a PCN reaction causing immediate rash, facial/tongue/throat swelling, SOB or lightheadedness with hypotension: No Has patient had a PCN reaction causing severe rash involving mucus membranes or skin necrosis: No Has patient had a PCN reaction that required hospitalization No Has patient had a PCN reaction occurring within the last 10 years: No If all of the above answers are "NO", then may proceed with Cephalosporin use.     Medications: I have reviewed the patient's current medications.  Results for orders placed or performed during the hospital encounter of 11/28/19 (from the past 48 hour(s))  Basic metabolic panel     Status: Abnormal   Collection Time: 11/28/19  8:04 PM  Result Value Ref Range   Sodium 133 (L) 135 - 145 mmol/L   Potassium 3.4 (L) 3.5 - 5.1 mmol/L   Chloride 95 (L) 98 - 111 mmol/L   CO2 25 22 - 32 mmol/L   Glucose, Bld 225 (H) 70 - 99 mg/dL    Comment: Glucose reference range applies only to samples taken after fasting for at least 8 hours.   BUN 60 (H) 8 - 23 mg/dL   Creatinine, Ser 1.84 (H) 0.61 - 1.24 mg/dL   Calcium 8.3 (L) 8.9 - 10.3 mg/dL   GFR calc non Af Amer 31 (L) >60 mL/min   GFR calc Af Amer 36 (L) >60 mL/min   Anion  gap 13 5 - 15    Comment: Performed at [facility], [address] [place], [place] [address]  CBC     Status: Abnormal   Collection Time: 11/28/19  8:04 PM  Result Value Ref Range   WBC 16.1 (H) 4.0 - 10.5 K/uL   RBC 5.18 4.22 - 5.81 MIL/uL   Hemoglobin 14.7 13.0 - 17.0 g/dL   HCT 46.0 39.0 - 52.0 %   MCV 88.8 80.0 - 100.0 fL   MCH 28.4 26.0 - 34.0 pg   MCHC 32.0 30.0 - 36.0 g/dL   RDW 14.4 11.5 - 15.5 %   Platelets 196 150 - 400 K/uL   nRBC 0.0 0.0 - 0.2 %    Comment: Performed at  [facility], [address] [place], [place] [address]  Lipase, blood     Status: Abnormal   Collection Time: 11/28/19  8:04 PM  Result Value Ref Range   Lipase 104 (H) 11 - 51 U/L    Comment: Performed at [facility], [address] [place], [place] [address]  Blood Culture (routine x 2)     Status: None (Preliminary result)   Collection Time: 11/28/19  8:04 PM   Specimen: BLOOD  Result Value Ref Range   Specimen Description BLOOD BLOOD LEFT HAND    Special Requests      BOTTLES DRAWN AEROBIC AND ANAEROBIC Blood Culture adequate volume   Culture      NO GROWTH < 12 HOURS Performed at [facility], [address] [place], [place] [address]    Report Status PENDING   Blood Culture (routine x 2)     Status: None (Preliminary result)   Collection Time: 11/28/19  8:04 PM   Specimen: BLOOD  Result Value Ref Range   Specimen Description BLOOD RIGHT ANTECUBITAL    Special Requests      BOTTLES DRAWN AEROBIC AND ANAEROBIC Blood Culture adequate volume   Culture      NO GROWTH < 12 HOURS Performed at [facility], [address] [place], [place] [address]    Report Status PENDING   Urinalysis, Complete w Microscopic     Status: Abnormal   Collection Time: 11/28/19  8:11 PM  Result Value Ref Range   Color, Urine AMBER (A) YELLOW    Comment: BIOCHEMICALS MAY BE AFFECTED BY COLOR   APPearance HAZY (A) CLEAR   Specific Gravity, Urine 1.016  1.005 - 1.030   pH 5.0 5.0 - 8.0   Glucose, UA NEGATIVE NEGATIVE mg/dL   Hgb urine dipstick NEGATIVE NEGATIVE   Bilirubin Urine NEGATIVE NEGATIVE   Ketones, ur NEGATIVE NEGATIVE mg/dL   Protein, ur NEGATIVE NEGATIVE mg/dL   Nitrite NEGATIVE NEGATIVE   Leukocytes,Ua TRACE (A) NEGATIVE   RBC / HPF 0-5 0 - 5 RBC/hpf   WBC, UA 0-5 0 - 5 WBC/hpf   Bacteria, UA NONE SEEN NONE SEEN   Squamous Epithelial / LPF 0-5 0 - 5   Mucus PRESENT    Hyaline Casts, UA PRESENT     Comment: Performed at [facility], [address], [place], [place] [address]  Respiratory Panel by RT PCR (Flu A&B, Covid) - Nasopharyngeal Swab     Status: None   Collection Time: 11/28/19  8:11 PM   Specimen: Nasopharyngeal Swab  Result Value Ref Range   SARS Coronavirus 2 by RT PCR NEGATIVE NEGATIVE    Comment: (NOTE) SARS-CoV-2 target nucleic acids are NOT DETECTED. The SARS-CoV-2 RNA is generally detectable in upper respiratoy specimens during the acute phase of infection. The lowest concentration of SARS-CoV-2 viral copies this assay can detect is 131 copies/mL. A negative result does not preclude SARS-Cov-2 infection and should not be used as the sole basis for treatment or other patient management decisions. A negative result may occur with  improper specimen collection/handling, submission of specimen other than nasopharyngeal swab, presence of viral mutation(s) within the areas targeted by this assay, and inadequate number of viral copies (<131 copies/mL). A negative result must be combined with clinical observations, patient history, and epidemiological information. The expected result is Negative. Fact Sheet for Patients:  PinkCheek.be Fact Sheet for Healthcare Providers:  GravelBags.it This test is not yet ap proved or cleared by  the [employer] and  has been authorized for detection and/or diagnosis of SARS-CoV-2 by FDA under an  Emergency Use Authorization (EUA). This EUA will remain  in effect (meaning this test can be used) for the duration of the COVID-19 declaration under Section 564(b)(1) of the Act, 21 U.S.C. section 360bbb-3(b)(1), unless the authorization is terminated or revoked sooner.    Influenza A by PCR NEGATIVE NEGATIVE   Influenza B by PCR NEGATIVE NEGATIVE    Comment: (NOTE) The Xpert Xpress SARS-CoV-2/FLU/RSV assay is intended as an aid in  the diagnosis of influenza from Nasopharyngeal swab specimens and  should not be used as a sole basis for treatment. Nasal washings and  aspirates are unacceptable for Xpert Xpress SARS-CoV-2/FLU/RSV  testing. Fact Sheet for Patients: PinkCheek.be Fact Sheet for Healthcare Providers: GravelBags.it This test is not yet approved or cleared by the [place] FDA and  has been authorized for detection and/or diagnosis of SARS-CoV-2 by  FDA under an Emergency Use Authorization (EUA). This EUA will remain  in effect (meaning this test can be used) for the duration of the  Covid-19 declaration under Section 564(b)(1) of the Act, 21  U.S.C. section 360bbb-3(b)(1), unless the authorization is  terminated or revoked. Performed at [facility], [address] [place], [place] [address]   Lactic acid, plasma     Status: Abnormal   Collection Time: 11/28/19  8:11 PM  Result Value Ref Range   Lactic Acid, Venous 3.9 (HH) 0.5 - 1.9 mmol/L    Comment: CRITICAL RESULT CALLED TO, READ BACK BY AND VERIFIED WITH ANNIE SMITH @2049  11/28/19 MJU Performed at [facility], [address] [place], [place] [address]   Hepatic function panel     Status: Abnormal   Collection Time: 11/28/19  8:14 PM  Result Value Ref Range   Total Protein 6.6 6.5 - 8.1 g/dL   Albumin 2.9 (L) 3.5 - 5.0 g/dL   AST 31 15 - 41 U/L   ALT 41 0 - 44 U/L   Alkaline Phosphatase 84 38 - 126 U/L   Total Bilirubin 1.7  (H) 0.3 - 1.2 mg/dL   Bilirubin, Direct 0.5 (H) 0.0 - 0.2 mg/dL   Indirect Bilirubin 1.2 (H) 0.3 - 0.9 mg/dL    Comment: Performed at [facility], [address] [place], [place] [address]  Lactic acid, plasma     Status: None   Collection Time: 11/28/19 11:24 PM  Result Value Ref Range   Lactic Acid, Venous 1.9 0.5 - 1.9 mmol/L    Comment: Performed at [facility], [place], [place] [address]  Troponin I (High Sensitivity)     Status: Abnormal   Collection Time: 11/29/19  3:05 AM  Result Value Ref Range   Troponin I (High Sensitivity) 20 (H) <18 ng/L    Comment: (NOTE) Elevated high sensitivity troponin I (hsTnI) values and significant  changes across serial measurements may suggest ACS but many other  chronic and acute conditions are known to elevate hsTnI results.  Refer to the "Links" section for chest pain algorithms and additional  guidance. Performed at [facility], [address] [place], [place] [address]   Glucose, capillary     Status: Abnormal   Collection Time: 11/29/19  3:40 AM  Result Value Ref Range   Glucose-Capillary 156 (H) 70 - 99 mg/dL    Comment: Glucose reference range applies only to samples taken after fasting for at least 8 hours.  MRSA PCR Screening     Status: None   Collection Time: 11/29/19  4:35 AM   Specimen: Nasopharyngeal  Result Value Ref Range   MRSA by PCR NEGATIVE NEGATIVE    Comment:        The GeneXpert MRSA Assay (FDA approved for NASAL specimens only), is one component of a comprehensive MRSA colonization surveillance program. It is not intended to diagnose MRSA infection nor to guide or monitor treatment for MRSA infections. Performed at [facility], [address] [place], [place] [address]   Protime-INR     Status: Abnormal   Collection Time: 11/29/19  5:34 AM  Result Value Ref Range   Prothrombin Time 15.3 (H) 11.4 - 15.2 seconds   INR 1.2 0.8 - 1.2    Comment:  (NOTE) INR goal varies based on device and disease states. Performed at [facility], [address] [place], [place] [address]   Cortisol-am, blood     Status: None   Collection Time: 11/29/19  5:34 AM  Result Value Ref Range   Cortisol - AM 14.7 6.7 - 22.6 ug/dL    Comment: Performed at [facility] [address], [place], [place] [address]  Procalcitonin     Status: None   Collection Time: 11/29/19  5:34 AM  Result Value Ref Range   Procalcitonin <0.10 ng/mL    Comment:        Interpretation: PCT (Procalcitonin) <= 0.5 ng/mL: Systemic infection (sepsis) is not likely. Local bacterial infection is possible. (NOTE)       Sepsis PCT Algorithm           Lower Respiratory Tract                                      Infection PCT Algorithm    ----------------------------     ----------------------------         PCT < 0.25 ng/mL                PCT < 0.10 ng/mL         Strongly encourage             Strongly discourage   discontinuation of antibiotics    initiation of antibiotics    ----------------------------     -----------------------------       PCT 0.25 - 0.50 ng/mL            PCT 0.10 - 0.25 ng/mL               OR       >80% decrease in PCT            Discourage initiation of                                            antibiotics      Encourage discontinuation           of antibiotics    ----------------------------     -----------------------------         PCT >= 0.50 ng/mL              PCT 0.26 - 0.50 ng/mL               AND        <  80% decrease in PCT             Encourage initiation of                                             antibiotics       Encourage continuation           of antibiotics    ----------------------------     -----------------------------        PCT >= 0.50 ng/mL                  PCT > 0.50 ng/mL               AND         increase in PCT                  Strongly encourage                                      initiation of  antibiotics    Strongly encourage escalation           of antibiotics                                     -----------------------------                                           PCT <= 0.25 ng/mL                                                 OR                                        > 80% decrease in PCT                                     Discontinue / Do not initiate                                             antibiotics Performed at [facility], [address] [place], [place] [address]   Comprehensive metabolic panel     Status: Abnormal   Collection Time: 11/29/19  5:34 AM  Result Value Ref Range   Sodium 138 135 - 145 mmol/L   Potassium 3.1 (L) 3.5 - 5.1 mmol/L   Chloride 105 98 - 111 mmol/L   CO2 24 22 - 32 mmol/L   Glucose, Bld 173 (H) 70 - 99 mg/dL    Comment: Glucose reference range applies only to samples taken after fasting for at least 8 hours.   BUN 51 (H) 8 - 23 mg/dL  Creatinine, Ser 1.21 0.61 - 1.24 mg/dL   Calcium 7.4 (L) 8.9 - 10.3 mg/dL   Total Protein 5.3 (L) 6.5 - 8.1 g/dL   Albumin 2.8 (L) 3.5 - 5.0 g/dL   AST 23 15 - 41 U/L   ALT 28 0 - 44 U/L   Alkaline Phosphatase 58 38 - 126 U/L   Total Bilirubin 1.5 (H) 0.3 - 1.2 mg/dL   GFR calc non Af Amer 52 (L) >60 mL/min   GFR calc Af Amer 60 (L) >60 mL/min   Anion gap 9 5 - 15    Comment: Performed at [facility], [address] [place], [place] [address]  CBC     Status: Abnormal   Collection Time: 11/29/19  5:34 AM  Result Value Ref Range   WBC 14.1 (H) 4.0 - 10.5 K/uL   RBC 3.94 (L) 4.22 - 5.81 MIL/uL   Hemoglobin 11.4 (L) 13.0 - 17.0 g/dL   HCT 35.9 (L) 39.0 - 52.0 %   MCV 91.1 80.0 - 100.0 fL   MCH 28.9 26.0 - 34.0 pg   MCHC 31.8 30.0 - 36.0 g/dL   RDW 14.4 11.5 - 15.5 %   Platelets 144 (L) 150 - 400 K/uL   nRBC 0.0 0.0 - 0.2 %    Comment: Performed at [facility], [place], [place] [address]  Troponin I (High Sensitivity)     Status: Abnormal    Collection Time: 11/29/19  5:34 AM  Result Value Ref Range   Troponin I (High Sensitivity) 18 (H) <18 ng/L    Comment: (NOTE) Elevated high sensitivity troponin I (hsTnI) values and significant  changes across serial measurements may suggest ACS but many other  chronic and acute conditions are known to elevate hsTnI results.  Refer to the "Links" section for chest pain algorithms and additional  guidance. Performed at [facility], [address] [place], [place] [address]     CT ABDOMEN WO CONTRAST  Result Date: 11/29/2019 CLINICAL DATA:  Vomiting and weakness EXAM: CT ABDOMEN WITHOUT CONTRAST TECHNIQUE: Multidetector CT imaging of the abdomen was performed following the standard protocol without IV contrast. COMPARISON:  Chest x-ray 11/28/2019, MRI 11/18/2019 FINDINGS: Lower chest: Lung bases demonstrate no acute consolidation or effusion. Mild cardiomegaly. Coronary vascular calcification and mitral calcification. Small hiatal hernia Hepatobiliary: No focal hepatic abnormality. No biliary distention. Stones within the gallbladder. Gallbladder wall appears slightly indistinct but there is motion. Pancreas: Unremarkable. No pancreatic ductal dilatation or surrounding inflammatory changes. Spleen: Normal in size without focal abnormality. Adrenals/Urinary Tract: Adrenal glands are normal. Kidneys show no hydronephrosis. Stomach/Bowel: The stomach is nonenlarged. Visible bowel shows no thickening Vascular/Lymphatic: Extensive aortic atherosclerosis. Mild aneurysmal dilatation of the distal aorta at the bifurcation with possible displaced wall calcification, no change. No suspicious nodes in the upper abdomen Other: No free air in the upper abdomen Musculoskeletal: Chronic compression deformity with treatment at L3. IMPRESSION: 1. Negative for free air within the upper abdomen. 2. Gallstones. Gallbladder wall is slightly indistinct suggesting possible mild wall thickening or subtle  inflammation however the study is degraded by patient motion. Electronically Signed   By: Donavan Foil M.D.   On: 11/29/2019 02:15   CT Head Wo Contrast  Result Date: 11/28/2019 CLINICAL DATA:  Weakness, fell, hypotension EXAM: CT HEAD WITHOUT CONTRAST TECHNIQUE: Contiguous axial images were obtained from the base of the skull through the vertex without intravenous contrast. COMPARISON:  11/18/2019 FINDINGS: Brain: Chronic confluent hypodensities throughout  the periventricular and subcortical white matter are consistent with chronic ischemic changes. No acute infarct or hemorrhage. Lateral ventricles and midline structures are stable. No acute extra-axial fluid collections. No mass effect. Vascular: No hyperdense vessel or unexpected calcification. Skull: Normal. Negative for fracture or focal lesion. Sinuses/Orbits: No acute finding. Other: None IMPRESSION: 1. Chronic ischemic changes as above. No acute intracranial process. Electronically Signed   By: Randa Ngo M.D.   On: 11/28/2019 20:57   DG Chest Port 1 View  Result Date: 11/28/2019 CLINICAL DATA:  Weakness, fell, hypotensive EXAM: PORTABLE CHEST 1 VIEW COMPARISON:  11/20/2019 FINDINGS: Single frontal view of the chest demonstrates an unremarkable cardiac silhouette. No airspace disease, effusion, or pneumothorax. No acute bony abnormalities. IMPRESSION: 1. No acute process. Electronically Signed   By: Randa Ngo M.D.   On: 11/28/2019 20:34    2D echo from 11/20/2019 reviewed.  Patient has significant aortic stenosis, pulmonary hypertension, right ventricular systolic function reduction and diastolic dysfunction.  Have preserved 55 to 60%  Review of Systems  Limited due to the patient being very hard of hearing.  Answers are vague.  Very dementia.  His only complaints are as noted above.  Blood pressure 107/60, pulse 62, temperature 98.7 F (37.1 C), temperature source Oral, resp. rate 18, height 5' 9"  (1.753 m), weight 99.2 kg, SpO2 97  %.   Physical Exam GENERAL: Elderly male, no respiratory distress, VERY HOH HEAD: Normocephalic, atraumatic.  EYES: Pupils equal, round, reactive to light.  No scleral icterus.  MOUTH: Oral mucosa moist.  No thrush. NECK: Supple. No thyromegaly. No nodules. No JVD.  Trachea midline.  No JVD. PULMONARY: Lungs clear to auscultation bilaterally. CARDIOVASCULAR: S1 and S2. Regular rate and rhythm.  There is a grade 2/6 harsh holo-systolic murmur present. GASTROINTESTINAL: Soft, slightly distended, bilateral lower quadrant tenderness without rebound.  Bowel sounds present. MUSCULOSKELETAL: No joint deformity, no clubbing, trace lower extremity edema bilaterally.  NEUROLOGIC: No overt focal deficit.  Appears to be pleasantly befuddled. SKIN: Intact,warm,dry.  Chronic stasis changes bilaterally PSYCH: Memory appears impaired.  Behavior is otherwise normal.   Assessment/Plan:  Transient hypotension Issue has resolved with volume repletion He is going to be more sensitive to volume depletion due to presence of aortic stenosis, pulmonary hypertension and decreased right ventricular systolic function Currently this issue has resolved No need for pressors  Abdominal pain Elevated lactic acid Normal procalcitonin Sepsis less likely Lactic acid may be elevated due to transient hypotension and low flow state due to hypovolemia Abdominal pain may be related to potential bowel ischemia which could also cause increased lactic acid Additional issues for abdominal pain could include diverticulitis CT abdomen with oral contrast may help in determining these issues Agree with cefepime and Flagyl Consider discontinuing vancomycin MRSA screen negative  DNR status Palliative care consult  Advanced age and debility Dementia It appears that patient's wife is overwhelmed with care of the patient at home Consider potential long-term care   Patient status currently is STEPDOWN.  Patient has not met  criteria for ICU level care.  At present the patient continues to be stable hemodynamically and could be transferred to [place] from our standpoint.  Thank you for allowing [place] to participate in this patient's care.  PCCM will not actively follow please reconsult as needed.  Patient was discussed during multidisciplinary rounds.   Renold Don, MD [facility] 11/29/2019, 4:45 PM

## 2019-11-29 NOTE — ED Notes (Signed)
Pt BP remains low. This nurse notified Randol Kern, NP. Orders placed will administer.  Awaiting for Albumin from pharm

## 2019-11-30 ENCOUNTER — Inpatient Hospital Stay: Payer: PPO

## 2019-11-30 DIAGNOSIS — F0390 Unspecified dementia without behavioral disturbance: Secondary | ICD-10-CM

## 2019-11-30 DIAGNOSIS — Z515 Encounter for palliative care: Secondary | ICD-10-CM

## 2019-11-30 DIAGNOSIS — E872 Acidosis, unspecified: Secondary | ICD-10-CM

## 2019-11-30 DIAGNOSIS — K858 Other acute pancreatitis without necrosis or infection: Secondary | ICD-10-CM

## 2019-11-30 DIAGNOSIS — K859 Acute pancreatitis without necrosis or infection, unspecified: Secondary | ICD-10-CM

## 2019-11-30 DIAGNOSIS — N179 Acute kidney failure, unspecified: Secondary | ICD-10-CM

## 2019-11-30 DIAGNOSIS — A419 Sepsis, unspecified organism: Secondary | ICD-10-CM

## 2019-11-30 DIAGNOSIS — R001 Bradycardia, unspecified: Secondary | ICD-10-CM

## 2019-11-30 DIAGNOSIS — Z7189 Other specified counseling: Secondary | ICD-10-CM

## 2019-11-30 LAB — CBC WITH DIFFERENTIAL/PLATELET
Abs Immature Granulocytes: 0.13 10*3/uL — ABNORMAL HIGH (ref 0.00–0.07)
Basophils Absolute: 0 10*3/uL (ref 0.0–0.1)
Basophils Relative: 0 %
Eosinophils Absolute: 0.1 10*3/uL (ref 0.0–0.5)
Eosinophils Relative: 1 %
HCT: 40.6 % (ref 39.0–52.0)
Hemoglobin: 12.7 g/dL — ABNORMAL LOW (ref 13.0–17.0)
Immature Granulocytes: 1 %
Lymphocytes Relative: 12 %
Lymphs Abs: 1.3 10*3/uL (ref 0.7–4.0)
MCH: 28.7 pg (ref 26.0–34.0)
MCHC: 31.3 g/dL (ref 30.0–36.0)
MCV: 91.9 fL (ref 80.0–100.0)
Monocytes Absolute: 1.1 10*3/uL — ABNORMAL HIGH (ref 0.1–1.0)
Monocytes Relative: 11 %
Neutro Abs: 7.5 10*3/uL (ref 1.7–7.7)
Neutrophils Relative %: 75 %
Platelets: 158 10*3/uL (ref 150–400)
RBC: 4.42 MIL/uL (ref 4.22–5.81)
RDW: 14.4 % (ref 11.5–15.5)
WBC: 10.2 10*3/uL (ref 4.0–10.5)
nRBC: 0 % (ref 0.0–0.2)

## 2019-11-30 LAB — BASIC METABOLIC PANEL
Anion gap: 6 (ref 5–15)
BUN: 24 mg/dL — ABNORMAL HIGH (ref 8–23)
CO2: 24 mmol/L (ref 22–32)
Calcium: 7.8 mg/dL — ABNORMAL LOW (ref 8.9–10.3)
Chloride: 108 mmol/L (ref 98–111)
Creatinine, Ser: 0.92 mg/dL (ref 0.61–1.24)
GFR calc Af Amer: >60 mL/min (ref >60)
GFR calc non Af Amer: >60 mL/min (ref >60)
Glucose, Bld: 144 mg/dL — ABNORMAL HIGH (ref 70–99)
Potassium: 3.7 mmol/L (ref 3.5–5.1)
Sodium: 138 mmol/L (ref 135–145)

## 2019-11-30 LAB — MAGNESIUM: Magnesium: 2 mg/dL (ref 1.7–2.4)

## 2019-11-30 MED ORDER — QUETIAPINE FUMARATE 25 MG PO TABS
25.0000 mg | ORAL_TABLET | Freq: Every day | ORAL | Status: DC
  Administered 2019-11-30 – 2019-12-01 (×2): 25 mg via ORAL
  Filled 2019-11-30 (×2): qty 1

## 2019-11-30 MED ORDER — OLANZAPINE 5 MG PO TABS
5.0000 mg | ORAL_TABLET | Freq: Every day | ORAL | Status: DC
  Filled 2019-11-30: qty 1

## 2019-11-30 MED ORDER — FUROSEMIDE 10 MG/ML IJ SOLN
40.0000 mg | Freq: Once | INTRAMUSCULAR | Status: AC
  Administered 2019-11-30: 40 mg via INTRAVENOUS
  Filled 2019-11-30: qty 4

## 2019-11-30 MED ORDER — HALOPERIDOL LACTATE 5 MG/ML IJ SOLN
1.0000 mg | Freq: Four times a day (QID) | INTRAMUSCULAR | Status: DC | PRN
  Administered 2019-11-30 – 2019-12-01 (×2): 1 mg via INTRAVENOUS
  Filled 2019-11-30 (×2): qty 1

## 2019-11-30 NOTE — Progress Notes (Signed)
Patient ID: David Morgan, male   DOB: [birth year], 86 y.o.   MRN: [id number] [facility] PROGRESS NOTE  David Morgan [id number] DOB: 12-17-1924 DOA: 11/28/2019 PCP: [name], MD  HPI/Subjective: Patient states that he lives in [place].  He feels okay.  He is hoping to go home soon.  Very weak with the physical therapist.  Objective: Vitals:   11/30/19 0844 11/30/19 1004  BP:  134/65  Pulse:  (!) 56  Resp: (!) 24   Temp:    SpO2:      Intake/Output Summary (Last 24 hours) at 11/30/2019 1339 Last data filed at 11/30/2019 0630 Gross per 24 hour  Intake 1441.64 ml  Output 1400 ml  Net 41.64 ml   Filed Weights   11/28/19 2007 11/30/19 0839  Weight: 99.2 kg 103.9 kg    ROS: Review of Systems  Unable to perform ROS: Dementia  Respiratory: Negative for shortness of breath.   Cardiovascular: Negative for chest pain.  Gastrointestinal: Negative for abdominal pain.   Exam: Physical Exam  HENT:  Nose: No mucosal edema.  Mouth/Throat: No oropharyngeal exudate or posterior oropharyngeal edema.  Eyes: Conjunctivae and lids are normal.  Cardiovascular: S1 normal and S2 normal. Exam reveals no gallop.  No murmur heard. Respiratory: No respiratory distress. He has decreased breath sounds in the right lower field and the left lower field. He has no wheezes. He has no rhonchi. He has no rales.  GI: Soft. Bowel sounds are normal. There is no abdominal tenderness.  Musculoskeletal:     Right ankle: Swelling present.     Left ankle: Swelling present.  Lymphadenopathy:    He has no cervical adenopathy.  Neurological: He is alert.  Skin: Skin is warm. Nails show no clubbing.  Chronic lower extremity discoloration  Psychiatric:  Patient stated he lives in [place]: Basic Metabolic Panel: Recent Labs  Lab 11/24/19 0359 11/25/19 0258 11/28/19 2004 11/29/19 0534 11/30/19 0041  NA 145 144 133* 138 138  K 3.9 3.8 3.4* 3.1*  3.7  CL 103 103 95* 105 108  CO2 31 29 25 24 24   GLUCOSE 223* 173* 225* 173* 144*  BUN 34* 30* 60* 51* 24*  CREATININE 1.03 0.94 1.84* 1.21 0.92  CALCIUM 8.5* 8.4* 8.3* 7.4* 7.8*  MG 2.3  --   --   --  2.0   Liver Function Tests: Recent Labs  Lab 11/24/19 0359 11/25/19 0258 11/28/19 2014 11/29/19 0534  AST 71* 53* 31 23  ALT 83* 83* 41 28  ALKPHOS 121 111 84 58  BILITOT 2.6* 2.0* 1.7* 1.5*  PROT 7.1 6.5 6.6 5.3*  ALBUMIN 2.9* 2.8* 2.9* 2.8*   Recent Labs  Lab 11/28/19 2004  LIPASE 104*   CBC: Recent Labs  Lab 11/25/19 0258 11/28/19 2004 11/29/19 0534 11/30/19 0839  WBC 10.5 16.1* 14.1* 10.2  NEUTROABS  --   --   --  7.5  HGB 15.7 14.7 11.4* 12.7*  HCT 50.3 46.0 35.9* 40.6  MCV 90.3 88.8 91.1 91.9  PLT 193 196 144* 158   BNP (last 3 results) Recent Labs    11/20/19 0340  BNP 186.6*     CBG: Recent Labs  Lab 11/25/19 1623 11/25/19 2135 11/26/19 0654 11/26/19 1235 11/29/19 0340  GLUCAP 233* 168* 181* 153* 156*    Recent Results (from the past 240 hour(s))  SARS CORONAVIRUS 2 (TAT 6-24 HRS) Nasopharyngeal Nasopharyngeal Swab     Status: None  Collection Time: 11/23/19 12:29 PM   Specimen: Nasopharyngeal Swab  Result Value Ref Range Status   SARS Coronavirus 2 NEGATIVE NEGATIVE Final    Comment: (NOTE) SARS-CoV-2 target nucleic acids are NOT DETECTED. The SARS-CoV-2 RNA is generally detectable in upper and lower respiratory specimens during the acute phase of infection. Negative results do not preclude SARS-CoV-2 infection, do not rule out co-infections with other pathogens, and should not be used as the sole basis for treatment or other patient management decisions. Negative results must be combined with clinical observations, patient history, and epidemiological information. The expected result is Negative. Fact Sheet for Patients: SugarRoll.be Fact Sheet for Healthcare  Providers: https://www.woods-mathews.com/ This test is not yet approved or cleared by the [place] FDA and  has been authorized for detection and/or diagnosis of SARS-CoV-2 by FDA under an Emergency Use Authorization (EUA). This EUA will remain  in effect (meaning this test can be used) for the duration of the COVID-19 declaration under Section 56 4(b)(1) of the Act, 21 U.S.C. section 360bbb-3(b)(1), unless the authorization is terminated or revoked sooner. Performed at [facility], [place] [address] [place], [place] [address]   Blood Culture (routine x 2)     Status: None (Preliminary result)   Collection Time: 11/28/19  8:04 PM   Specimen: BLOOD  Result Value Ref Range Status   Specimen Description BLOOD BLOOD LEFT HAND  Final   Special Requests   Final    BOTTLES DRAWN AEROBIC AND ANAEROBIC Blood Culture adequate volume   Culture   Final    NO GROWTH 2 DAYS Performed at [facility], [address] [place], [place] [address]    Report Status PENDING  Incomplete  Blood Culture (routine x 2)     Status: None (Preliminary result)   Collection Time: 11/28/19  8:04 PM   Specimen: BLOOD  Result Value Ref Range Status   Specimen Description BLOOD RIGHT ANTECUBITAL  Final   Special Requests   Final    BOTTLES DRAWN AEROBIC AND ANAEROBIC Blood Culture adequate volume   Culture   Final    NO GROWTH 2 DAYS Performed at [facility], [address], [place], [place] [address]    Report Status PENDING  Incomplete  Respiratory Panel by RT PCR (Flu A&B, Covid) - Nasopharyngeal Swab     Status: None   Collection Time: 11/28/19  8:11 PM   Specimen: Nasopharyngeal Swab  Result Value Ref Range Status   SARS Coronavirus 2 by RT PCR NEGATIVE NEGATIVE Final    Comment: (NOTE) SARS-CoV-2 target nucleic acids are NOT DETECTED. The SARS-CoV-2 RNA is generally detectable in upper respiratoy specimens during the acute phase of infection. The  lowest concentration of SARS-CoV-2 viral copies this assay can detect is 131 copies/mL. A negative result does not preclude SARS-Cov-2 infection and should not be used as the sole basis for treatment or other patient management decisions. A negative result may occur with  improper specimen collection/handling, submission of specimen other than nasopharyngeal swab, presence of viral mutation(s) within the areas targeted by this assay, and inadequate number of viral copies (<131 copies/mL). A negative result must be combined with clinical observations, patient history, and epidemiological information. The expected result is Negative. Fact Sheet for Patients:  PinkCheek.be Fact Sheet for Healthcare Providers:  GravelBags.it This test is not yet ap proved or cleared by the [place] FDA and  has been authorized for detection and/or diagnosis of SARS-CoV-2 by FDA under an Emergency  Use Authorization (EUA). This EUA will remain  in effect (meaning this test can be used) for the duration of the COVID-19 declaration under Section 564(b)(1) of the Act, 21 U.S.C. section 360bbb-3(b)(1), unless the authorization is terminated or revoked sooner.    Influenza A by PCR NEGATIVE NEGATIVE Final   Influenza B by PCR NEGATIVE NEGATIVE Final    Comment: (NOTE) The Xpert Xpress SARS-CoV-2/FLU/RSV assay is intended as an aid in  the diagnosis of influenza from Nasopharyngeal swab specimens and  should not be used as a sole basis for treatment. Nasal washings and  aspirates are unacceptable for Xpert Xpress SARS-CoV-2/FLU/RSV  testing. Fact Sheet for Patients: PinkCheek.be Fact Sheet for Healthcare Providers: GravelBags.it This test is not yet approved or cleared by the [place] FDA and  has been authorized for detection and/or diagnosis of SARS-CoV-2 by  FDA under an Emergency  Use Authorization (EUA). This EUA will remain  in effect (meaning this test can be used) for the duration of the  Covid-19 declaration under Section 564(b)(1) of the Act, 21  U.S.C. section 360bbb-3(b)(1), unless the authorization is  terminated or revoked. Performed at [facility], [address] [place], [place] [address]   MRSA PCR Screening     Status: None   Collection Time: 11/29/19  4:35 AM   Specimen: Nasopharyngeal  Result Value Ref Range Status   MRSA by PCR NEGATIVE NEGATIVE Final    Comment:        The GeneXpert MRSA Assay (FDA approved for NASAL specimens only), is one component of a comprehensive MRSA colonization surveillance program. It is not intended to diagnose MRSA infection nor to guide or monitor treatment for MRSA infections. Performed at [facility], [address] [place], [place] [address]      Studies: CT ABDOMEN WO CONTRAST  Result Date: 11/29/2019 CLINICAL DATA:  Vomiting and weakness EXAM: CT ABDOMEN WITHOUT CONTRAST TECHNIQUE: Multidetector CT imaging of the abdomen was performed following the standard protocol without IV contrast. COMPARISON:  Chest x-ray 11/28/2019, MRI 11/18/2019 FINDINGS: Lower chest: Lung bases demonstrate no acute consolidation or effusion. Mild cardiomegaly. Coronary vascular calcification and mitral calcification. Small hiatal hernia Hepatobiliary: No focal hepatic abnormality. No biliary distention. Stones within the gallbladder. Gallbladder wall appears slightly indistinct but there is motion. Pancreas: Unremarkable. No pancreatic ductal dilatation or surrounding inflammatory changes. Spleen: Normal in size without focal abnormality. Adrenals/Urinary Tract: Adrenal glands are normal. Kidneys show no hydronephrosis. Stomach/Bowel: The stomach is nonenlarged. Visible bowel shows no thickening Vascular/Lymphatic: Extensive aortic atherosclerosis. Mild aneurysmal dilatation of the distal aorta at the  bifurcation with possible displaced wall calcification, no change. No suspicious nodes in the upper abdomen Other: No free air in the upper abdomen Musculoskeletal: Chronic compression deformity with treatment at L3. IMPRESSION: 1. Negative for free air within the upper abdomen. 2. Gallstones. Gallbladder wall is slightly indistinct suggesting possible mild wall thickening or subtle inflammation however the study is degraded by patient motion. Electronically Signed   By: Donavan Foil M.D.   On: 11/29/2019 02:15   CT Head Wo Contrast  Result Date: 11/28/2019 CLINICAL DATA:  Weakness, fell, hypotension EXAM: CT HEAD WITHOUT CONTRAST TECHNIQUE: Contiguous axial images were obtained from the base of the skull through the vertex without intravenous contrast. COMPARISON:  11/18/2019 FINDINGS: Brain: Chronic confluent hypodensities throughout the periventricular and subcortical white matter are consistent with chronic ischemic changes. No acute infarct or hemorrhage. Lateral ventricles and midline structures are stable. No acute extra-axial fluid  collections. No mass effect. Vascular: No hyperdense vessel or unexpected calcification. Skull: Normal. Negative for fracture or focal lesion. Sinuses/Orbits: No acute finding. Other: None IMPRESSION: 1. Chronic ischemic changes as above. No acute intracranial process. Electronically Signed   By: Randa Ngo M.D.   On: 11/28/2019 20:57   DG Chest Port 1 View  Result Date: 11/28/2019 CLINICAL DATA:  Weakness, fell, hypotensive EXAM: PORTABLE CHEST 1 VIEW COMPARISON:  11/20/2019 FINDINGS: Single frontal view of the chest demonstrates an unremarkable cardiac silhouette. No airspace disease, effusion, or pneumothorax. No acute bony abnormalities. IMPRESSION: 1. No acute process. Electronically Signed   By: Randa Ngo M.D.   On: 11/28/2019 20:34    Scheduled Meds: . aspirin EC  81 mg Oral Daily  . carvedilol  3.125 mg Oral BID WC  . Chlorhexidine Gluconate Cloth   6 each Topical Daily  . donepezil  5 mg Oral Daily  . heparin  5,000 Units Subcutaneous Q8H  . levothyroxine  112 mcg Oral QAC breakfast  . multivitamin with minerals  1 tablet Oral Daily  . OLANZapine  5 mg Oral QHS  . tamsulosin  0.4 mg Oral QPM   Continuous Infusions: . ceFEPime (MAXIPIME) IV 2 g (11/30/19 1105)  . metronidazole 500 mg (11/30/19 1103)    Assessment/Plan:  1. Sepsis present on admission with hypotension.  Could be gallbladder cause.  Continue antibiotics.  General surgery does not want to do any drainage tube or take out the gallbladder at this time this has resolved.  Patient still on antibiotics.  Cultures are negative. 2. Acute pancreatitis status post ERCP.  Lipase trended better. 3. Acute kidney injury.  This has improved.  Has underlying chronic kidney disease stage II. 4. Bradycardia.  Hold Coreg. 5. Lactic acidosis on presentation. 6. Chronic dementia.  Patient followed by palliative care here in the hospital.  Made a DO NOT RESUSCITATE. 7. Chronic diastolic congestive heart failure.  Stop IV fluids.  Give a dose of Lasix and get a chest x-ray. 8. Hypothyroidism unspecified on levothyroxine 9. BPH on Flomax  Code Status:     Code Status Orders  (From admission, onward)         Start     Ordered   11/29/19 0000  Do not attempt resuscitation (DNR)  Continuous    Question Answer Comment  In the event of cardiac or respiratory ARREST Do not call a "code blue"   In the event of cardiac or respiratory ARREST Do not perform Intubation, CPR, defibrillation or ACLS   In the event of cardiac or respiratory ARREST Use medication by any route, position, wound care, and other measures to relive pain and suffering. May use oxygen, suction and manual treatment of airway obstruction as needed for comfort.      11/28/19 2359        Code Status History    Date Active Date Inactive Code Status Order ID Comments User Context   11/28/2019 2359 11/28/2019 2359 Full  Code [id number]  Artist Beach, MD ED   11/25/2019 1428 11/26/2019 2205 DNR [id number]  Domenic Polite, MD Inpatient   11/18/2019 2242 11/25/2019 1428 Full Code [id number]  Bernadette Hoit, DO Inpatient   12/21/2017 1300 12/22/2017 2105 Full Code [id number]  Henreitta Leber, MD Inpatient   09/19/2015 1150 09/21/2015 1950 Full Code [id number]  Inez Catalina, MD Inpatient   09/18/2015 1817 09/19/2015 1150 Full Code [id number]  Hubbard Robinson, MD ED   09/03/2015 1919 09/06/2015 1833 Full Code  [id number]  Hubbard Robinson, MD Inpatient   09/03/2015 1046 09/03/2015 1919 Full Code [id number]  Hubbard Robinson, MD ED   09/03/2015 1046 09/03/2015 1046 Full Code [id number]  Hubbard Robinson, MD ED   08/06/2015 2031 08/10/2015 1759 Full Code [id number]  Sherri Rad, MD Inpatient   06/28/2015 1657 07/03/2015 1412 Full Code [id number]  Marlyce Huge, MD ED   Advance Care Planning Activity     Family Communication: Spoke with wife on the phone [facility] Plan: [facility]  Antibiotics:  Cefepime  Flagyl  Time spent: 28 minutes  [place]

## 2019-11-30 NOTE — NC FL2 (Signed)
[place] LEVEL OF CARE SCREENING TOOL     IDENTIFICATION  Patient Name: David Morgan Birthdate: Apr 27, 1925 Sex: male Admission Date (Current Location): 11/28/2019  [facility] and [place] Number:      Facility and Address:         Provider Number:    Attending Physician Name and Address:  Loletha Grayer, MD  Relative Name and Phone Number:       Current Level of Care:   Recommended Level of Care:   Prior Approval Number:    Date Approved/Denied:   PASRR Number:    Discharge Plan:      Current Diagnoses: Patient Active Problem List   Diagnosis Date Noted  . Sepsis associated hypotension ([place]) 11/28/2019  . Acute diastolic CHF (congestive heart failure) ([place]) 11/21/2019  . Pulmonary vascular congestion 11/20/2019  . Choledocholithiasis   . Dementia without behavioral disturbance ([place]) 11/19/2019  . Accident due to mechanical fall without injury 11/19/2019  . Hyperglycemia due to type 2 diabetes mellitus ([place]) 11/19/2019  . Leukocytosis 11/19/2019  . AKI (acute kidney injury) ([place]) 11/19/2019  . Troponin I above reference range 11/19/2019  . COPD with acute exacerbation ([place]) 11/19/2019  . Severe sepsis ([place]) 11/19/2019  . Elevated liver enzymes 11/18/2019  . Cholelithiasis 11/18/2019  . Pressure injury of skin 12/22/2017  . Back pain 12/21/2017  . History of diverticulitis 05/02/2016  . Difficulty walking 02/04/2016  . Tremor 02/04/2016  . Weakness 02/04/2016  . Disease of thyroid gland 07/10/2015  . BP (high blood pressure) 07/10/2015  . HLD (hyperlipidemia) 07/10/2015  . BPH (benign prostatic hyperplasia) 08/02/2014    Orientation RESPIRATION BLADDER Height & Weight            Weight: 103.9 kg Height:  5\' 9"  (175.3 cm)  BEHAVIORAL SYMPTOMS/MOOD NEUROLOGICAL BOWEL NUTRITION STATUS           AMBULATORY STATUS COMMUNICATION OF NEEDS Skin                               Personal Care Assistance Level of Assistance               Functional Limitations Info             SPECIAL CARE FACTORS FREQUENCY                       Contractures      Additional Factors Info                  Current Medications (11/30/2019):  This is the current hospital active medication list Current Facility-Administered Medications  Medication Dose Route Frequency Provider Last Rate Last Admin  . acetaminophen (TYLENOL) tablet 650 mg  650 mg Oral Q6H PRN Acheampong, Warnell Bureau, MD       Or  . acetaminophen (TYLENOL) suppository 650 mg  650 mg Rectal Q6H PRN Acheampong, Warnell Bureau, MD      . aspirin EC tablet 81 mg  81 mg Oral Daily Acheampong, Warnell Bureau, MD   81 mg at 11/30/19 1045  . carvedilol (COREG) tablet 3.125 mg  3.125 mg Oral BID WC Acheampong, Warnell Bureau, MD   Stopped at 11/30/19 1026  . ceFEPIme (MAXIPIME) 2 g in sodium chloride 0.9 % 100 mL IVPB  2 g Intravenous Q12H Hall, Scott A, RPH 200 mL/hr at 11/30/19 1105 2 g at 11/30/19 1105  . Chlorhexidine Gluconate  Cloth 2 % PADS 6 each  6 each Topical Daily Acheampong, Warnell Bureau, MD   6 each at 11/30/19 1046  . donepezil (ARICEPT) tablet 5 mg  5 mg Oral Daily Acheampong, Warnell Bureau, MD   5 mg at 11/30/19 1045  . furosemide (LASIX) injection 40 mg  40 mg Intravenous Once Loletha Grayer, MD      . heparin injection 5,000 Units  5,000 Units Subcutaneous Q8H Acheampong, Warnell Bureau, MD   5,000 Units at 11/30/19 [phone]  . levothyroxine (SYNTHROID) tablet 112 mcg  112 mcg Oral QAC breakfast Artist Beach, MD   112 mcg at 11/30/19 [phone]  . metroNIDAZOLE (FLAGYL) IVPB 500 mg  500 mg Intravenous Q8H Acheampong, Warnell Bureau, MD 100 mL/hr at 11/30/19 1103 500 mg at 11/30/19 1103  . multivitamin with minerals tablet 1 tablet  1 tablet Oral Daily Acheampong, Warnell Bureau, MD   1 tablet at 11/30/19 1045  . senna-docusate (Senokot-S) tablet 1 tablet  1 tablet Oral QHS PRN Acheampong, Warnell Bureau, MD      . tamsulosin (FLOMAX) capsule 0.4 mg  0.4 mg Oral QPM Acheampong, Warnell Bureau, MD   0.4 mg at  11/29/19 1804  . vancomycin (VANCOREADY) IVPB 2000 mg/400 mL  2,000 mg Intravenous Once [name], MD   Stopped at 11/28/19 2309     Discharge Medications: Please see discharge summary for a list of discharge medications.  Relevant Imaging Results:  Relevant Lab Results:   Additional Information    Shelbie Hutching, RN
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
## 2019-11-30 NOTE — Consult Note (Signed)
Consultation Note Date: 11/30/2019   Patient Name: David Morgan  DOB: May 11, 1925  MRN: [id number]  Age / Sex: 86 y.o., male  PCP: [name], MD Referring Physician: Loletha Grayer, MD  Reason for Consultation: Establishing goals of care  HPI/Patient Profile: 86 y.o. male  with past medical history of COPD, HTN, HLD, hypothyroidism, dementia, CHF, aortic stenosis, and hard of hearing admitted on 11/28/2019 with a fall.  He was recently admitting to [facility] for acute cholelithiasis and had an ERCP and sphincterectomy. He was discharged home 4/10 then back to [facility] ED on 4/12. Found to be lethargic and hypotensive with elevated lactic acid levels. Patient being treated for sepsis. He was transferred to ICU for hypotension and treated with fluid resuscitation. He now is out of ICU. Surgery consulted and recommend no surgical intervention and continue current treatment. PMT consulted for [place].   Clinical Assessment and Goals of Care: I have reviewed medical records including EPIC notes, labs and imaging, received report from RN, assessed the patient and then spoke with patient's spouse, David Morgan, to discuss diagnosis prognosis, GOC, EOL wishes, disposition and options.  When I saw patient he was unable to participate in any goals of care conversations - he tells me he has "been babysitting a little boy all night". RN endorses confusion as well.   I introduced Palliative Medicine as specialized medical care for people living with serious illness. It focuses on providing relief from the symptoms and stress of a serious illness. The goal is to improve quality of life for both the patient and the family.  We discussed a brief life review of the patient. Patient and spouse have been married for 21 years. He had 2 sons - 1 passed away and the other is estranged - has not seen patient in at least 21 years. Wife tells me patient has cows and how much he  enjoys them - he has asked that his ashes be spread in pasture when he passes away. Wife spends a lot of time sharing about family dynamics and patient's life.  As far as functional and nutritional status, wife speaks of great decline, significantly over the past month.  He falls frequently and is very weak. He is typically confused per wife. No problems with appetite until last hospitalization - poor appetite since then.    We discussed patient's current illness and what it means in the larger context of patient's on-going co-morbidities.  Natural disease trajectory and expectations at EOL were discussed. Wife shares she is prepared for patient's death and understands he is nearing EOL. She talks about his worsening dementia and overall frailty.   I attempted to elicit values and goals of care important to the patient.  She tells me about patient's living will and request for DNR.   The difference between aggressive medical intervention and comfort care was considered in light of the patient's goals of care. We discuss concern that patient is at high risk for rehospitalization d/t chronic illnesses, frailty, and functional decline. She expresses understanding and tells me she does not want him to be in and out of the hospital. However, she does state that she would want him to come back to the hospital for "something serious" such as pneumonia or stroke. We discuss concept of MOST form - she tells me she is coming to hospital tomorrow and agrees to discuss this then.   Hospice and Palliative Care services outpatient were explained and offered. We did discuss hospice care when she  is ready to transition to comfort focused care/after rehab complete. She is agreeable to this. Palliative will follow outpatient.  Wife's main goal is LTC for patient - she feels unable to care for him at home anymore.  Questions and concerns were addressed. The family was encouraged to call with questions or concerns.    Primary Decision Maker NEXT OF KIN - wife - David Morgan  SUMMARY OF RECOMMENDATIONS   - wife to come tomorrow - hopeful to complete MOST then - DNR, confirmed with wife - outpatient palliative to follow at facility - hospice discussed, discussed when this would be appropriate for patient  Code Status/Advance Care Planning:  DNR  Psycho-social/Spiritual:   Desire for further Chaplaincy support:no  Additional Recommendations: Education on Hospice  Prognosis:   Unable to determine  Discharge Planning: [place] for rehab with Palliative care service follow-up      Primary Diagnoses: Present on Admission: . Sepsis associated hypotension ([place])   I have reviewed the medical record, interviewed the patient and family, and examined the patient. The following aspects are pertinent.  Past Medical History:  Diagnosis Date  . Arthritis   . Cancer ([place])   . Claudication ([place])   . Diverticulitis   . Emphysema/COPD ([place])   . History of bladder cancer   . History of carotid artery stenosis   . History of TIA (transient ischemic attack)    2012--  NO RESIDUAL (PER SCAN HAD A PREVIOUS TIA BEFORE 2012)  . Hyperlipidemia   . Hypertension   . Hypothyroidism   . Lesion of bladder   . Mild asthma    NO INHALER  . Nocturia   . Peripheral vascular disease ([place])   . Short of breath on exertion   . Urgency of urination   . Wears glasses    Social History   Socioeconomic History  . Marital status: Married    Spouse name: Not on file  . Number of children: Not on file  . Years of education: Not on file  . Highest education level: Not on file  Occupational History  . Not on file  Tobacco Use  . Smoking status: Former Smoker    Packs/day: 1.00    Years: 40.00    Pack years: 40.00    Types: Cigarettes    Quit date: 12/21/1993    Years since quitting: 25.9  . Smokeless tobacco: Never Used  Substance and Sexual Activity  . Alcohol use: No    Comment:  RARE  . Drug use: No  . Sexual activity: Never  Other Topics Concern  . Not on file  Social History Narrative  . Not on file   Social Determinants of Health   Financial Resource Strain:   . Difficulty of Paying Living Expenses:   Food Insecurity:   . Worried About Charity fundraiser in the Last Year:   . Arboriculturist in the Last Year:   Transportation Needs:   . Film/video editor (Medical):   [name] Lack of Transportation (Non-Medical):   Physical Activity:   . Days of Exercise per Week:   . Minutes of Exercise per Session:   Stress:   . Feeling of Stress :   Social Connections:   . Frequency of Communication with Friends and Family:   . Frequency of Social Gatherings with Friends and Family:   . Attends Religious Services:   . Active Member of Clubs or Organizations:   . Attends Archivist  Meetings:   [name] Marital Status:    Family History  Problem Relation Age of Onset  . Hypertension Father   . Alzheimer's disease Mother    Scheduled Meds: . aspirin EC  81 mg Oral Daily  . carvedilol  3.125 mg Oral BID WC  . Chlorhexidine Gluconate Cloth  6 each Topical Daily  . donepezil  5 mg Oral Daily  . heparin  5,000 Units Subcutaneous Q8H  . levothyroxine  112 mcg Oral QAC breakfast  . multivitamin with minerals  1 tablet Oral Daily  . tamsulosin  0.4 mg Oral QPM   Continuous Infusions: . ceFEPime (MAXIPIME) IV 2 g (11/30/19 1105)  . lactated ringers 30 mL/hr at 11/30/19 1027  . metronidazole 500 mg (11/30/19 1103)  . vancomycin Stopped (11/28/19 2309)   PRN Meds:.acetaminophen **OR** acetaminophen, senna-docusate Allergies  Allergen Reactions  . Adhesive [Tape] Rash  . Augmentin [Amoxicillin-Pot Clavulanate] Itching, Rash and Other (See Comments)    Has patient had a PCN reaction causing immediate rash, facial/tongue/throat swelling, SOB or lightheadedness with hypotension: No Has patient had a PCN reaction causing severe rash involving mucus membranes  or skin necrosis: No Has patient had a PCN reaction that required hospitalization No Has patient had a PCN reaction occurring within the last 10 years: No If all of the above answers are "NO", then may proceed with Cephalosporin use.    Review of Systems  Unable to perform ROS: Dementia    Physical Exam Constitutional:      General: He is not in acute distress. Cardiovascular:     Rate and Rhythm: Normal rate.  Pulmonary:     Effort: Pulmonary effort is normal. No respiratory distress.  Musculoskeletal:     Right lower leg: No edema.     Left lower leg: No edema.  Skin:    General: Skin is warm and dry.  Neurological:     Mental Status: He is alert. He is disoriented.     Comments: hallucinations     Vital Signs: BP 134/65   Pulse (!) 56   Temp 98.4 F (36.9 C) (Oral)   Resp (!) 24   Ht 5\' 9"  (1.753 m)   Wt 103.9 kg   SpO2 100%   BMI 33.83 kg/m  Pain Scale: 0-10   Pain Score: 0-No pain   SpO2: SpO2: 100 % O2 Device:SpO2: 100 % O2 Flow Rate: .   IO: Intake/output summary:   Intake/Output Summary (Last 24 hours) at 11/30/2019 1131 Last data filed at 11/30/2019 0630 Gross per 24 hour  Intake 1441.64 ml  Output 1400 ml  Net 41.64 ml    LBM: Last BM Date: 11/29/19 Baseline Weight: Weight: 99.2 kg Most recent weight: Weight: 103.9 kg     Palliative Assessment/Data: PPS 30%    Time Total: 70 minutes Greater than 50%  of this time was spent counseling and coordinating care related to the above assessment and plan.  Juel Burrow, DNP, AGNP-C Palliative Medicine Team [phone] Pager: [phone]

## 2019-11-30 NOTE — TOC Progression Note (Signed)
Transition of Care [facility]) - Progression Note    Patient Details  Name: David Morgan MRN: [id number] Date of Birth: 19-Apr-1925  [facility]) CM/SW Contact  Shelbie Hutching, RN Phone Number: 11/30/2019, 4:28 PM  Clinical Narrative:    Expected discharge date Friday 4/16- [facility] has offered a bed and patients wife would like for the patient to go to [facility].  Ship broker through [employer] started.    Expected Discharge Plan: [place] Barriers to Discharge: Continued Medical Work up  Expected Discharge Plan and Services Expected Discharge Plan: [place] arrangements for the past 2 months: [place]: [place] Date [place]: 11/29/19   Representative spoke with at [place]: [name]   Social Determinants of Health ([place]) Interventions    Readmission Risk Interventions No flowsheet data found.

## 2019-11-30 NOTE — Progress Notes (Addendum)
Subjective:  CC: David Morgan is a 86 y.o. male  Hospital stay day 2,   sepsis  HPI: No issues.   ROS:  Unable to obtain secondary to patient mentation  Objective:   Temp:  [98.4 F (36.9 C)-98.7 F (37.1 C)] 98.4 F (36.9 C) (04/14 0839) Pulse Rate:  [56-119] 56 (04/14 1004) Resp:  [12-24] 24 (04/14 0844) BP: (101-134)/(48-91) 134/65 (04/14 1004) SpO2:  [92 %-100 %] 100 % (04/14 0839) Weight:  [103.9 kg] 103.9 kg (04/14 0839)     Height: 5\' 9"  (175.3 cm) Weight: 103.9 kg BMI (Calculated): 33.81   Intake/Output this shift:   Intake/Output Summary (Last 24 hours) at 11/30/2019 1328 Last data filed at 11/30/2019 0630 Gross per 24 hour  Intake 1441.64 ml  Output 1400 ml  Net 41.64 ml    Constitutional :  alert, cooperative, appears stated age and no distress  Respiratory:  clear to auscultation bilaterally  Cardiovascular:  regular rate and rhythm  Gastrointestinal: soft, non-tender; bowel sounds normal; no masses,  no organomegaly.   Skin: Cool and moist  Psychiatric: Normal affect, non-agitated, not confused       LABS:  CMP Latest Ref Rng & Units 11/30/2019 11/29/2019 11/28/2019  Glucose 70 - 99 mg/dL 144(H) 173(H) 225(H)  BUN 8 - 23 mg/dL 24(H) 51(H) 60(H)  Creatinine 0.61 - 1.24 mg/dL 0.92 1.21 1.84(H)  Sodium 135 - 145 mmol/L 138 138 133(L)  Potassium 3.5 - 5.1 mmol/L 3.7 3.1(L) 3.4(L)  Chloride 98 - 111 mmol/L 108 105 95(L)  CO2 22 - 32 mmol/L 24 24 25   Calcium 8.9 - 10.3 mg/dL 7.8(L) 7.4(L) 8.3(L)  Total Protein 6.5 - 8.1 g/dL - 5.3(L) 6.6  Total Bilirubin 0.3 - 1.2 mg/dL - 1.5(H) 1.7(H)  Alkaline Phos 38 - 126 U/L - 58 84  AST 15 - 41 U/L - 23 31  ALT 0 - 44 U/L - 28 41   CBC Latest Ref Rng & Units 11/30/2019 11/29/2019 11/28/2019  WBC 4.0 - 10.5 K/uL 10.2 14.1(H) 16.1(H)  Hemoglobin 13.0 - 17.0 g/dL 12.7(L) 11.4(L) 14.7  Hematocrit 39.0 - 52.0 % 40.6 35.9(L) 46.0  Platelets 150 - 400 K/uL 158 144(L) 196    RADS: n/a Assessment:   Sepsis. Still  unsure true origin, but overall improving numbers, clinical exam stable.  Recommend continue current management for now.  Surgery will peripherally follow for now.  Please call with any questions

## 2019-11-30 NOTE — Progress Notes (Signed)
Patient heart rate was 56 and had Carvedilol due, messaged Dr. Leslye Peer about same and he said to hold the Carvedilol and he put in a new order for the Carvedilol with parameters.

## 2019-11-30 NOTE — Progress Notes (Signed)
Manufacturing engineer [facility]) Hospital Liaison RN note  This patient is a pending palliative patient for outpatient community based Palliative Care. Pt was readmitted to the hospital before appointment could be made from referral.  Will continue to follow for disposition.  Please contact me with any questions related to outpatient palliative care services.  Thank you. Margaretmary Eddy, BSN, RN [place] Hospital Liaison [phone]

## 2019-11-30 NOTE — Evaluation (Signed)
Occupational Therapy Evaluation Patient Details Name: David Morgan MRN: [id number] DOB: June 01, 1925 Today's Date: 11/30/2019    History of Present Illness Pt is 86 y/o M who was admitted for hypotension secondary to sepsis with complaints of AMS and falls. History includes COPD, HTN, HLD, and recent hospital stay at [facility] secodnary to acute choleclithiasis with ERCP. Pt was declined for SNF, transitioned home and now readmitted.   Clinical Impression   Pt was seen for OT evaluation this date. Prior to hospital admission, pt reports he was able to t/f himself to w/c and self-propel with LEs, states he was able to bathe/dress himself and that his spouse assisted with IADLs such as cooking/cleaning. Pt lives in [facility] with ramped entrance. Currently pt demonstrates impairments as described below (See OT problem list) which functionally limit his ability to perform ADL/self-care tasks. Pt currently requires MOD/MAX A for sup<>sit transition, demos Poor static sitting balance requiring external support and use of B UEs and requires Setup to MOD A with UB ADLs while requiring MAX A with LB ADLs.  Pt would benefit from skilled OT to address noted impairments and functional limitations (see below for any additional details) in order to maximize safety and independence while minimizing falls risk and caregiver burden. Upon hospital discharge, recommend STR to maximize pt safety and return to PLOF.     Follow Up Recommendations  SNF;Supervision/Assistance - 24 hour    Equipment Recommendations  3 in 1 bedside commode;Hospital bed;Other (comment)(hoyer lift)    Recommendations for Other Services       Precautions / Restrictions Precautions Precautions: Fall Restrictions Weight Bearing Restrictions: No      Mobility Bed Mobility Overal bed mobility: Needs Assistance Bed Mobility: Supine to Sit     Supine to sit: Mod assist;Max assist;HOB elevated Sit to supine: Mod assist   General bed  mobility comments: Difficulty advancing LEs to EOB for sup to sit and managing LEs for back to bed  Transfers                 General transfer comment: deferred, unable to assess on OT eval. Anticipate 2p needed.    Balance Overall balance assessment: Needs assistance Sitting-balance support: Feet supported;Bilateral upper extremity supported Sitting balance-Leahy Scale: Poor Sitting balance - Comments: Pt requires MOD A to sustain static sitting balance, does demos some improvement to MIN A after getting hips squared and feet planted, but ultimately requires UE support and external support throughout       Standing balance comment: unable to assess.                           ADL either performed or assessed with clinical judgement   ADL Overall ADL's : Needs assistance/impaired Eating/Feeding: Set up;Sitting   Grooming: Oral care;Minimal assistance;Bed level   Upper Body Bathing: Moderate assistance;Sitting Upper Body Bathing Details (indicate cue type and reason): MOD A 2/2 poor static sitting balance Lower Body Bathing: Maximal assistance;Bed level Lower Body Bathing Details (indicate cue type and reason): using lateral rolling technique. Upper Body Dressing : Moderate assistance;Sitting Upper Body Dressing Details (indicate cue type and reason): MOD A d/t poor static sitting balance Lower Body Dressing: Maximal assistance;Bed level Lower Body Dressing Details (indicate cue type and reason): to don socks d/t poor LE strength/ROM   Toilet Transfer Details (indicate cue type and reason): unable to attempt t/f w/o 2p assistance. Pt demos poor static sitting balance Toileting- Clothing Manipulation and  Hygiene: Moderate assistance;Maximal assistance;Bed level Toileting - Clothing Manipulation Details (indicate cue type and reason): to use urinal to void             Vision   Vision Assessment?: No apparent visual deficits Additional Comments: Pt appears to  track appropriately, difficult to formally assess 2/2 cognition     Perception     Praxis      Pertinent Vitals/Pain Pain Assessment: Faces Faces Pain Scale: Hurts little more Pain Location: abdomen Pain Descriptors / Indicators: Discomfort;Grimacing;Guarding Pain Intervention(s): Limited activity within patient's tolerance;Monitored during session     Hand Dominance     Extremity/Trunk Assessment Upper Extremity Assessment Upper Extremity Assessment: Overall WFL for tasks assessed(able to move through full arc of motion, grip grossly 4/5)   Lower Extremity Assessment Lower Extremity Assessment: Generalized weakness(difficulty raising LEs from bed and moving toward side for sup to sit.)   Cervical / Trunk Assessment Cervical / Trunk Assessment: Kyphotic   Communication Communication Communication: HOH   Cognition Arousal/Alertness: Awake/alert Behavior During Therapy: WFL for tasks assessed/performed Overall Cognitive Status: Impaired/Different from baseline Area of Impairment: Orientation;Following commands;Memory                 Orientation Level: Disoriented to;Place;Time;Situation   Memory: Decreased short-term memory Following Commands: Follows one step commands consistently;Follows one step commands with increased time       General Comments: Pt only oriented to self, is able to state that he is in the hospital when prompted to look at context. Unable to state which hospital and disoriented to all temporal concepts. Pt's PLOF information provided appears mostly relevant/plausible.   General Comments       Exercises Other Exercises Other Exercises: OT facilitates education re: importance of OOB activity. Pt with min/mod reception of education in the moment, but no long-term learning detected.   Shoulder Instructions      Home Living Family/patient expects to be discharged to:: Private residence Living Arrangements: Spouse/significant other Available  Help at Discharge: Family Type of Home: House Home Access: [place]: One level     Bathroom Shower/Tub: Occupational psychologist: [place] Accessibility: Yes How Accessible: Accessible via walker [place]: [place] - 2 wheels;Wheelchair - manual   Additional Comments: Pt HOH and somewhat poor historian 2/2 confusion. Some of above information gleaned from previous eval at [facility] and some from pt.      Prior Functioning/Environment Level of Independence: Needs assistance  Gait / Transfers Assistance Needed: Pt states he was using 2WW at one point, but had too many falls and now uses w/c. States he is able to propel himself with feet. ADL's / Homemaking Assistance Needed: States his wife performs household IADLs including cooking, cleaning and provides trasportation. Pt states he is able to bathe and dress self, but this is questionable as he has difficulty with static sitting balance this date, much less the dynamic balance that would be required to reach feet.            OT Problem List: Decreased strength;Decreased range of motion;Decreased activity tolerance;Impaired balance (sitting and/or standing);Decreased safety awareness;Decreased knowledge of use of DME or AE;Pain;Decreased cognition;Cardiopulmonary status limiting activity      OT Treatment/Interventions: Self-care/ADL training;Therapeutic exercise;Neuromuscular education;DME and/or AE instruction;Therapeutic activities;Cognitive remediation/compensation;Patient/family education;Balance training    OT Goals(Current goals can be found in the care plan section) Acute Rehab OT Goals Patient Stated Goal: to get out of the hospital OT Goal Formulation:  With patient Time For Goal Achievement: 12/14/19 Potential to Achieve Goals: Good  OT Frequency: Min 2X/week   Barriers to D/C:            Co-evaluation              AM-PAC OT "6 Clicks" Daily Activity     Outcome  Measure Help from another person eating meals?: A Little Help from another person taking care of personal grooming?: A Little Help from another person toileting, which includes using toliet, bedpan, or urinal?: A Lot Help from another person bathing (including washing, rinsing, drying)?: A Lot Help from another person to put on and taking off regular upper body clothing?: A Lot Help from another person to put on and taking off regular lower body clothing?: A Lot 6 Click Score: 14   End of Session Nurse Communication: Other (comment)(notified RN and CNA that pt without condom cath at this time despite having had one in ICU.)  Activity Tolerance: Patient tolerated treatment well Patient left: with call bell/phone within reach;in bed;with bed alarm set  OT Visit Diagnosis: Unsteadiness on feet (R26.81);Other abnormalities of gait and mobility (R26.89);Muscle weakness (generalized) (M62.81);History of falling (Z91.81)                Time: [id number] OT Time Calculation (min): 26 min Charges:  OT General Charges $OT Visit: 1 Visit OT Evaluation $OT Eval Moderate Complexity: 1 Mod OT Treatments $Self Care/Home Management : 8-22 mins  Gerrianne Scale, MS, OTR/L ascom [phone] 11/30/19, 10:41 AM

## 2019-12-01 ENCOUNTER — Inpatient Hospital Stay: Payer: PPO

## 2019-12-01 DIAGNOSIS — I5032 Chronic diastolic (congestive) heart failure: Secondary | ICD-10-CM

## 2019-12-01 DIAGNOSIS — N189 Chronic kidney disease, unspecified: Secondary | ICD-10-CM

## 2019-12-01 LAB — CBC WITH DIFFERENTIAL/PLATELET
Abs Immature Granulocytes: 0.13 10*3/uL — ABNORMAL HIGH (ref 0.00–0.07)
Basophils Absolute: 0 10*3/uL (ref 0.0–0.1)
Basophils Relative: 0 %
Eosinophils Absolute: 0.1 10*3/uL (ref 0.0–0.5)
Eosinophils Relative: 1 %
HCT: 40.7 % (ref 39.0–52.0)
Hemoglobin: 13.4 g/dL (ref 13.0–17.0)
Immature Granulocytes: 1 %
Lymphocytes Relative: 15 %
Lymphs Abs: 1.5 10*3/uL (ref 0.7–4.0)
MCH: 28.5 pg (ref 26.0–34.0)
MCHC: 32.9 g/dL (ref 30.0–36.0)
MCV: 86.4 fL (ref 80.0–100.0)
Monocytes Absolute: 1.5 10*3/uL — ABNORMAL HIGH (ref 0.1–1.0)
Monocytes Relative: 15 %
Neutro Abs: 6.8 10*3/uL (ref 1.7–7.7)
Neutrophils Relative %: 68 %
Platelets: 189 10*3/uL (ref 150–400)
RBC: 4.71 MIL/uL (ref 4.22–5.81)
RDW: 14.3 % (ref 11.5–15.5)
WBC: 10 10*3/uL (ref 4.0–10.5)
nRBC: 0 % (ref 0.0–0.2)

## 2019-12-01 MED ORDER — IPRATROPIUM-ALBUTEROL 0.5-2.5 (3) MG/3ML IN SOLN
3.0000 mL | Freq: Four times a day (QID) | RESPIRATORY_TRACT | Status: DC
  Administered 2019-12-01 – 2019-12-02 (×3): 3 mL via RESPIRATORY_TRACT
  Filled 2019-12-01 (×4): qty 3

## 2019-12-01 MED ORDER — POLYETHYLENE GLYCOL 3350 17 G PO PACK
17.0000 g | PACK | Freq: Every day | ORAL | Status: DC
  Administered 2019-12-02: 17 g via ORAL
  Filled 2019-12-01: qty 1

## 2019-12-01 MED ORDER — BUDESONIDE 0.5 MG/2ML IN SUSP
0.5000 mg | Freq: Two times a day (BID) | RESPIRATORY_TRACT | Status: DC
  Administered 2019-12-01 – 2019-12-02 (×2): 0.5 mg via RESPIRATORY_TRACT
  Filled 2019-12-01 (×2): qty 2

## 2019-12-01 MED ORDER — SODIUM CHLORIDE 0.9 % IV SOLN
2.0000 g | INTRAVENOUS | Status: DC
  Filled 2019-12-01: qty 20

## 2019-12-01 NOTE — TOC Progression Note (Signed)
Transition of Care [facility]) - Progression Note    Patient Details  Name: David Morgan MRN: [id number] Date of Birth: December 03, 1924  [facility]) CM/SW Contact  Shelbie Hutching, RN Phone Number: 12/01/2019, 3:33 PM  Clinical Narrative:    Expected discharge tomorrow to [facility].  [employer] has authorized for patient for 7 days start date whenever discharged as long as within the next 5 days.  Auth # [id number].  [facility] transport has also been authorized by [facility] and is good for 90 days.  Auth for EMS is [id number].    Expected Discharge Plan: [place] Barriers to Discharge: Continued Medical Work up  Expected Discharge Plan and Services Expected Discharge Plan: [place] arrangements for the past 2 months: [place]: [place] Date [place]: 11/29/19   Representative spoke with at [place]: [name]   Social Determinants of Health ([place]) Interventions    Readmission Risk Interventions No flowsheet data found.

## 2019-12-01 NOTE — Progress Notes (Signed)
Subjective:  CC: David Morgan is a 86 y.o. male  Hospital stay day 3,   sepsis  HPI: No issues overnight.  No complaints  ROS:  Unable to obtain due to patient mentation  Objective:   Temp:  [98 F (36.7 C)-98.8 F (37.1 C)] 98 F (36.7 C) (04/15 0837) Pulse Rate:  [65-78] 78 (04/15 0837) Resp:  [18-22] 19 (04/15 0837) BP: (111-159)/(62-75) 159/73 (04/15 0837) SpO2:  [95 %-99 %] 97 % (04/15 0837)     Height: 5\' 9"  (175.3 cm) Weight: 103.9 kg BMI (Calculated): 33.81   Intake/Output this shift:   Intake/Output Summary (Last 24 hours) at 12/01/2019 1151 Last data filed at 12/01/2019 1015 Gross per 24 hour  Intake 240 ml  Output 1400 ml  Net -1160 ml    Constitutional :  alert, cooperative, appears stated age and no distress  Respiratory:  clear to auscultation bilaterally  Cardiovascular:  regular rate and rhythm  Gastrointestinal: soft, no guarding, minor TTP in LLQ.   Skin: Cool and moist  Psychiatric: Normal affect, non-agitated, not confused       LABS:  CMP Latest Ref Rng & Units 11/30/2019 11/29/2019 11/28/2019  Glucose 70 - 99 mg/dL 144(H) 173(H) 225(H)  BUN 8 - 23 mg/dL 24(H) 51(H) 60(H)  Creatinine 0.61 - 1.24 mg/dL 0.92 1.21 1.84(H)  Sodium 135 - 145 mmol/L 138 138 133(L)  Potassium 3.5 - 5.1 mmol/L 3.7 3.1(L) 3.4(L)  Chloride 98 - 111 mmol/L 108 105 95(L)  CO2 22 - 32 mmol/L 24 24 25   Calcium 8.9 - 10.3 mg/dL 7.8(L) 7.4(L) 8.3(L)  Total Protein 6.5 - 8.1 g/dL - 5.3(L) 6.6  Total Bilirubin 0.3 - 1.2 mg/dL - 1.5(H) 1.7(H)  Alkaline Phos 38 - 126 U/L - 58 84  AST 15 - 41 U/L - 23 31  ALT 0 - 44 U/L - 28 41   CBC Latest Ref Rng & Units 12/01/2019 11/30/2019 11/29/2019  WBC 4.0 - 10.5 K/uL 10.0 10.2 14.1(H)  Hemoglobin 13.0 - 17.0 g/dL 13.4 12.7(L) 11.4(L)  Hematocrit 39.0 - 52.0 % 40.7 40.6 35.9(L)  Platelets 150 - 400 K/uL 189 158 144(L)    RADS: n/a Assessment:   Sepsis. Still unsure true origin, but overall continues to do well, RUQ [place] with no  signs of cholecystitis.  No need for surgical intervention at this point.  Further care per hospitalist, no f/u needed with me.  miralax for likely constipation to see if minor LLQ pain improves

## 2019-12-01 NOTE — Care Management Important Message (Signed)
Important Message  Patient Details  Name: LERAY GRUMBINE MRN: [id number] Date of Birth: [birth year]   Medicare Important Message Given:  Yes     Juliann Pulse A Jaiana Sheffer 12/01/2019, 11:11 AM

## 2019-12-01 NOTE — TOC Progression Note (Signed)
Transition of Care [facility]) - Progression Note    Patient Details  Name: David Morgan MRN: [id number] Date of Birth: 05-05-1925  Transition of Care [facility]) CM/SW Contact  Shelbie Hutching, RN Phone Number: 12/01/2019, 3:47 PM  Clinical Narrative:    Patient's wife [name] updated on plan of care and plan to discharge to [facility] tomorrow.    Expected Discharge Plan: [place] Barriers to Discharge: Continued Medical Work up  Expected Discharge Plan and Services Expected Discharge Plan: [place] arrangements for the past 2 months: [place]: [place] Date [place]: 11/29/19   Representative spoke with at [place]: [name]   Social Determinants of Health ([place]) Interventions    Readmission Risk Interventions No flowsheet data found.

## 2019-12-01 NOTE — Progress Notes (Signed)
Physical Therapy Treatment Patient Details Name: David Morgan MRN: [id number] DOB: 10-23-1924 Today's Date: 12/01/2019    History of Present Illness Pt is 86 y/o M who was admitted for hypotension secondary to sepsis with complaints of AMS and falls. History includes COPD, HTN, HLD, and recent hospital stay at [facility] secodnary to acute choleclithiasis with ERCP. Pt was declined for SNF, transitioned home and now readmitted.    PT Comments    Pt was supine in bed with HOB slightly elevated.  He is very HOH and confused but agrees to PT session. He performed several exercises in bed prior to sitting up EOB. Pt is impulsive throughout session. Oriented to self only. Mod assist to achieve EOB sitting on L side with vcs and tactile cueing throughout. Pt does follow commands but requires increased time and tactile cueing. Unsafe to trial standing 2/2 to pt's impulsivity with just sitting. Several times yells out for wife. Pt require mod assist to return to supine after EOB activity. Therapist recommends DC to SNF to address strength and safe functional mobility deficits. Acute PT will continue to follow per POC.    Follow Up Recommendations  SNF     Equipment Recommendations  Other (comment)(defer to next level of care)    Recommendations for Other Services       Precautions / Restrictions Precautions Precautions: Fall Precaution Comments: (PT is HOH but was able to follow commands with increased tim) Restrictions Weight Bearing Restrictions: No    Mobility  Bed Mobility Overal bed mobility: Needs Assistance Bed Mobility: Supine to Sit     Supine to sit: Mod assist;HOB elevated Sit to supine: Mod assist;HOB elevated   General bed mobility comments: pt was able to progress from long sitting to EOB short sit with increased time and max vcs. he required mod assist to achieve full upright sitting. Pt sat EOB x ~ 8 minutes but several times attempted to return to supine  impulsively.  Transfers                 General transfer comment: tried to encourage standing however pt gets slightly agitated and  refuses. Unsafe to stand 2/2 to getting combative.  Ambulation/Gait                 Stairs             Wheelchair Mobility    Modified Rankin (Stroke Patients Only)       Balance Overall balance assessment: Needs assistance Sitting-balance support: Feet supported;Bilateral upper extremity supported Sitting balance-Leahy Scale: Fair Sitting balance - Comments: pt was able to sit EOB with min assist to maintain balance. Pt's impulsivity makes it hard to judge if balance deificts are present or if moreso cognition deificits limiting                                    Cognition Arousal/Alertness: Awake/alert Behavior During Therapy: Agitated;Restless;Impulsive Overall Cognitive Status: Impaired/Different from baseline Area of Impairment: Orientation;Following commands;Memory                 Orientation Level: Disoriented to;Place;Time;Situation   Memory: Decreased short-term memory Following Commands: Follows one step commands consistently;Follows one step commands with increased time       General Comments: pt was oriented to self only. He is slightly impulsive and required increased time to perform desired task.      Exercises General Exercises -  Lower Extremity Ankle Circles/Pumps: AROM;Both;10 reps;Supine Long Arc Quad: AROM;Both;10 reps;Seated Straight Leg Raises: AAROM;10 reps;Supine;Right    General Comments        Pertinent Vitals/Pain Pain Assessment: No/denies pain Pain Intervention(s): Limited activity within patient's tolerance;Monitored during session;Repositioned    Home Living                      Prior Function            PT Goals (current goals can now be found in the care plan section) Acute Rehab PT Goals Patient Stated Goal: none states Progress towards PT  goals: Not progressing toward goals - comment(cognition/ impulsivity limiting progression)    Frequency    Min 2X/week      PT Plan Current plan remains appropriate    Co-evaluation     PT goals addressed during session: Mobility/safety with mobility;Balance;Strengthening/ROM        AM-PAC PT "6 Clicks" Mobility   Outcome Measure  Help needed turning from your back to your side while in a flat bed without using bedrails?: A Lot Help needed moving from lying on your back to sitting on the side of a flat bed without using bedrails?: A Lot Help needed moving to and from a bed to a chair (including a wheelchair)?: Total Help needed standing up from a chair using your arms (e.g., wheelchair or bedside chair)?: Total Help needed to walk in hospital room?: Total Help needed climbing 3-5 steps with a railing? : Total 6 Click Score: 8    End of Session Equipment Utilized During Treatment: Gait belt Activity Tolerance: Patient tolerated treatment well Patient left: in bed;with bed alarm set Nurse Communication: Mobility status PT Visit Diagnosis: Other abnormalities of gait and mobility (R26.89);Unsteadiness on feet (R26.81);Muscle weakness (generalized) (M62.81)     Time: [id number] PT Time Calculation (min) (ACUTE ONLY): 16 min  Charges:  $Therapeutic Activity: 8-22 mins                    Julaine Fusi PTA 12/01/19, 2:53 PM

## 2019-12-01 NOTE — Progress Notes (Signed)
Patient incontinent of bowel and bladder. Patient had medium-size, type 5, brown bowel movement. Urine appeared clear and amber-colored. Bowel movement and urinary incontinence documented on I&O flowsheet.

## 2019-12-01 NOTE — Progress Notes (Signed)
Patient ID: David Morgan, male   DOB: 1925-04-22, 86 y.o.   MRN: [id number] [facility] PROGRESS NOTE  David Morgan [id number] DOB: 28-Aug-1924 DOA: 11/28/2019 PCP: [name], MD  HPI/Subjective: The patient answers a few yes or no questions.  Had some abdominal pain.  Also had a little upper airway congestion.  Objective: Vitals:   11/30/19 2349 12/01/19 0837  BP: 133/62 (!) 159/73  Pulse: 78 78  Resp: (!) 21 19  Temp: 98.3 F (36.8 C) 98 F (36.7 C)  SpO2: 98% 97%    Intake/Output Summary (Last 24 hours) at 12/01/2019 1508 Last data filed at 12/01/2019 1015 Gross per 24 hour  Intake 240 ml  Output 200 ml  Net 40 ml   Filed Weights   11/28/19 2007 11/30/19 0839  Weight: 99.2 kg 103.9 kg    ROS: Review of Systems  Unable to perform ROS: Dementia  Respiratory: Negative for shortness of breath.   Cardiovascular: Negative for chest pain.  Gastrointestinal: Positive for abdominal pain.   Exam: Physical Exam  HENT:  Nose: No mucosal edema.  Mouth/Throat: No oropharyngeal exudate or posterior oropharyngeal edema.  Eyes: Conjunctivae and lids are normal.  Cardiovascular: S1 normal and S2 normal. Exam reveals no gallop.  No murmur heard. Respiratory: No respiratory distress. He has decreased breath sounds in the right lower field and the left lower field. He has no wheezes. He has no rhonchi. He has no rales.  GI: Soft. Bowel sounds are normal. There is no abdominal tenderness.  Musculoskeletal:     Right ankle: Swelling present.     Left ankle: Swelling present.  Lymphadenopathy:    He has no cervical adenopathy.  Neurological: He is alert.  Skin: Skin is warm. Nails show no clubbing.  Chronic lower extremity discoloration  Psychiatric:  Patient stated he lives in [place]: Basic Metabolic Panel: Recent Labs  Lab 11/25/19 0258 11/28/19 2004 11/29/19 0534 11/30/19 0041  NA 144 133* 138 138  K 3.8 3.4*  3.1* 3.7  CL 103 95* 105 108  CO2 29 25 24 24   GLUCOSE 173* 225* 173* 144*  BUN 30* 60* 51* 24*  CREATININE 0.94 1.84* 1.21 0.92  CALCIUM 8.4* 8.3* 7.4* 7.8*  MG  --   --   --  2.0   Liver Function Tests: Recent Labs  Lab 11/25/19 0258 11/28/19 2014 11/29/19 0534  AST 53* 31 23  ALT 83* 41 28  ALKPHOS 111 84 58  BILITOT 2.0* 1.7* 1.5*  PROT 6.5 6.6 5.3*  ALBUMIN 2.8* 2.9* 2.8*   Recent Labs  Lab 11/28/19 2004  LIPASE 104*   CBC: Recent Labs  Lab 11/25/19 0258 11/28/19 2004 11/29/19 0534 11/30/19 0839 12/01/19 0533  WBC 10.5 16.1* 14.1* 10.2 10.0  NEUTROABS  --   --   --  7.5 6.8  HGB 15.7 14.7 11.4* 12.7* 13.4  HCT 50.3 46.0 35.9* 40.6 40.7  MCV 90.3 88.8 91.1 91.9 86.4  PLT 193 196 144* 158 189   BNP (last 3 results) Recent Labs    11/20/19 0340  BNP 186.6*     CBG: Recent Labs  Lab 11/25/19 1623 11/25/19 2135 11/26/19 0654 11/26/19 1235 11/29/19 0340  GLUCAP 233* 168* 181* 153* 156*    Recent Results (from the past 240 hour(s))  SARS CORONAVIRUS 2 (TAT 6-24 HRS) Nasopharyngeal Nasopharyngeal Swab     Status: None   Collection Time: 11/23/19 12:29 PM   Specimen:  Nasopharyngeal Swab  Result Value Ref Range Status   SARS Coronavirus 2 NEGATIVE NEGATIVE Final    Comment: (NOTE) SARS-CoV-2 target nucleic acids are NOT DETECTED. The SARS-CoV-2 RNA is generally detectable in upper and lower respiratory specimens during the acute phase of infection. Negative results do not preclude SARS-CoV-2 infection, do not rule out co-infections with other pathogens, and should not be used as the sole basis for treatment or other patient management decisions. Negative results must be combined with clinical observations, patient history, and epidemiological information. The expected result is Negative. Fact Sheet for Patients: SugarRoll.be Fact Sheet for Healthcare Providers: https://www.woods-mathews.com/ This test  is not yet approved or cleared by the [place] FDA and  has been authorized for detection and/or diagnosis of SARS-CoV-2 by FDA under an Emergency Use Authorization (EUA). This EUA will remain  in effect (meaning this test can be used) for the duration of the COVID-19 declaration under Section 56 4(b)(1) of the Act, 21 U.S.C. section 360bbb-3(b)(1), unless the authorization is terminated or revoked sooner. Performed at [facility], [place] [address], [place], [place] [address]   Blood Culture (routine x 2)     Status: None (Preliminary result)   Collection Time: 11/28/19  8:04 PM   Specimen: BLOOD  Result Value Ref Range Status   Specimen Description BLOOD BLOOD LEFT HAND  Final   Special Requests   Final    BOTTLES DRAWN AEROBIC AND ANAEROBIC Blood Culture adequate volume   Culture   Final    NO GROWTH 3 DAYS Performed at [facility], [address], [place], [place] [address]    Report Status PENDING  Incomplete  Blood Culture (routine x 2)     Status: None (Preliminary result)   Collection Time: 11/28/19  8:04 PM   Specimen: BLOOD  Result Value Ref Range Status   Specimen Description BLOOD RIGHT ANTECUBITAL  Final   Special Requests   Final    BOTTLES DRAWN AEROBIC AND ANAEROBIC Blood Culture adequate volume   Culture   Final    NO GROWTH 3 DAYS Performed at  [facility], [address] [place], [place] [address]    Report Status PENDING  Incomplete  Respiratory Panel by RT PCR (Flu A&B, Covid) - Nasopharyngeal Swab     Status: None   Collection Time: 11/28/19  8:11 PM   Specimen: Nasopharyngeal Swab  Result Value Ref Range Status   SARS Coronavirus 2 by RT PCR NEGATIVE NEGATIVE Final    Comment: (NOTE) SARS-CoV-2 target nucleic acids are NOT DETECTED. The SARS-CoV-2 RNA is generally detectable in upper respiratoy specimens during the acute phase of infection. The lowest concentration of SARS-CoV-2 viral copies this assay can detect is 131  copies/mL. A negative result does not preclude SARS-Cov-2 infection and should not be used as the sole basis for treatment or other patient management decisions. A negative result may occur with  improper specimen collection/handling, submission of specimen other than nasopharyngeal swab, presence of viral mutation(s) within the areas targeted by this assay, and inadequate number of viral copies (<131 copies/mL). A negative result must be combined with clinical observations, patient history, and epidemiological information. The expected result is Negative. Fact Sheet for Patients:  PinkCheek.be Fact Sheet for Healthcare Providers:  GravelBags.it This test is not yet ap proved or cleared by the [place] FDA and  has been authorized for detection and/or diagnosis of SARS-CoV-2 by FDA under an Emergency Use Authorization (EUA). This EUA will remain  in effect (meaning this test can be used) for the duration of the COVID-19 declaration under Section 564(b)(1) of the Act, 21 U.S.C. section 360bbb-3(b)(1), unless the authorization is terminated or revoked sooner.    Influenza A by PCR NEGATIVE NEGATIVE Final   Influenza B by PCR NEGATIVE NEGATIVE Final    Comment: (NOTE) The Xpert Xpress SARS-CoV-2/FLU/RSV assay is intended as an aid in  the diagnosis of influenza from Nasopharyngeal swab specimens and  should not be used as a sole basis for treatment. Nasal washings and  aspirates are unacceptable for Xpert Xpress SARS-CoV-2/FLU/RSV  testing. Fact Sheet for Patients: PinkCheek.be Fact Sheet for Healthcare Providers: GravelBags.it This test is not yet approved or cleared by the [place] FDA and  has been authorized for detection and/or diagnosis of SARS-CoV-2 by  FDA under an Emergency Use Authorization (EUA). This EUA will remain  in effect (meaning this test can  be used) for the duration of the  Covid-19 declaration under Section 564(b)(1) of the Act, 21  U.S.C. section 360bbb-3(b)(1), unless the authorization is  terminated or revoked. Performed at [facility], [name] [place], [place] [address]   MRSA PCR Screening     Status: None   Collection Time: 11/29/19  4:35 AM   Specimen: Nasopharyngeal  Result Value Ref Range Status   MRSA by PCR NEGATIVE NEGATIVE Final    Comment:        The GeneXpert MRSA Assay (FDA approved for NASAL specimens only), is one component of a comprehensive MRSA colonization surveillance program. It is not intended to diagnose MRSA infection nor to guide or monitor treatment for MRSA infections. Performed at [facility], [address] [place], [place] [address]      Studies: [facility] Chest [place] 1 View  Result Date: 11/30/2019 CLINICAL DATA:  86 year old male with cough. History of bladder cancer. EXAM: PORTABLE CHEST 1 VIEW COMPARISON:  Portable chest 11/28/2019 and earlier. CT Chest, Abdomen, and Pelvis 11/18/2019. FINDINGS: Portable AP semi upright view at 1235 hours. Lower lung volumes. Stable cardiac size and mediastinal contours. Increased hilar and basilar crowding of lung markings/atelectasis. No superimposed pulmonary edema suspected. No pneumothorax or consolidation. No pleural effusion is evident. Visualized tracheal air column is within normal limits. Stable visualized osseous structures. Paucity of bowel gas in the upper abdomen. IMPRESSION: Low lung volumes with atelectasis. Electronically Signed   By: Genevie Ann M.D.   On: 11/30/2019 14:41   US Abdomen Limited RUQ  Result Date: 12/01/2019 CLINICAL DATA:  Right upper quadrant abdominal pain. EXAM: ULTRASOUND ABDOMEN LIMITED RIGHT UPPER QUADRANT COMPARISON:  November 29, 2019. FINDINGS: Gallbladder: Cholelithiasis is noted with largest stone measuring 6 mm. No gallbladder wall thickening or pericholecystic fluid is noted. No  sonographic Murphy's sign is noted. Common bile duct: Diameter: 6 mm which is within normal limits. Liver: No focal lesion identified. Within normal limits in parenchymal echogenicity. Portal vein is patent on color Doppler imaging with normal direction of blood flow towards the liver. Other: None. IMPRESSION: Cholelithiasis without evidence of cholecystitis. No other abnormality seen in the right upper quadrant of the abdomen. Electronically Signed   By: Marijo Conception M.D.   On: 12/01/2019 10:48    Scheduled Meds: . aspirin EC  81 mg Oral Daily  . budesonide (PULMICORT) nebulizer solution  0.5 mg Nebulization BID  . carvedilol  3.125 mg Oral BID WC  . Chlorhexidine Gluconate Cloth  6 each Topical Daily  . donepezil  5 mg  Oral Daily  . heparin  5,000 Units Subcutaneous Q8H  . ipratropium-albuterol  3 mL Nebulization Q6H  . levothyroxine  112 mcg Oral QAC breakfast  . multivitamin with minerals  1 tablet Oral Daily  . polyethylene glycol  17 g Oral Daily  . QUEtiapine  25 mg Oral QHS  . tamsulosin  0.4 mg Oral QPM   Continuous Infusions: . ceFEPime (MAXIPIME) IV 2 g (11/30/19 2202)  . [START ON 12/02/2019] cefTRIAXone (ROCEPHIN)  IV    . metronidazole 500 mg (12/01/19 0136)    Assessment/Plan:  1. Sepsis present on admission with hypotension.  Potential gallbladder cause.  Continue antibiotics.  General surgery does not want to do any drainage tube or take out the gallbladder at this time this has resolved.  Patient still on antibiotics.  Cultures are negative.  Patient placed on MiraLAX for constipation. 2. Acute pancreatitis status post ERCP.  Lipase trended better. 3. Acute kidney injury.  This has improved.  Has underlying chronic kidney disease stage II. 4. Bradycardia.  Hold Coreg. 5. Lactic acidosis on presentation. 6. Chronic dementia.  Patient followed by palliative care here in the hospital.  Made a DO NOT RESUSCITATE. 7. Chronic diastolic congestive heart failure.  Chest  x-ray did not show any signs of heart failure. 8. Upper airway congestion start nebulizer treatments. 9. Hypothyroidism unspecified on levothyroxine 10. BPH on Flomax 11. Patient is a DO NOT RESUSCITATE  Code Status:     Code Status Orders  (From admission, onward)         Start     Ordered   11/29/19 0000  Do not attempt resuscitation (DNR)  Continuous    Question Answer Comment  In the event of cardiac or respiratory ARREST Do not call a "code blue"   In the event of cardiac or respiratory ARREST Do not perform Intubation, CPR, defibrillation or ACLS   In the event of cardiac or respiratory ARREST Use medication by any route, position, wound care, and other measures to relive pain and suffering. May use oxygen, suction and manual treatment of airway obstruction as needed for comfort.      11/28/19 2359        Code Status History    Date Active Date Inactive Code Status Order ID Comments User Context   11/28/2019 2359 11/28/2019 2359 Full Code [id number]  Artist Beach, MD ED   11/25/2019 1428 11/26/2019 2205 DNR [id number]  Domenic Polite, MD Inpatient   11/18/2019 2242 11/25/2019 1428 Full Code [id number]  Bernadette Hoit, DO Inpatient   12/21/2017 1300 12/22/2017 2105 Full Code [id number]  Henreitta Leber, MD Inpatient   09/19/2015 1150 09/21/2015 1950 Full Code [id number]  Inez Catalina, MD Inpatient   09/18/2015 1817 09/19/2015 1150 Full Code [id number]  Hubbard Robinson, MD ED   09/03/2015 1919 09/06/2015 1833 Full Code [id number]  Hubbard Robinson, MD Inpatient   09/03/2015 1046 09/03/2015 1919 Full Code [id number]  Hubbard Robinson, MD ED   09/03/2015 1046 09/03/2015 1046 Full Code [id number]  Hubbard Robinson, MD ED   08/06/2015 2031 08/10/2015 1759 Full Code [id number]  Sherri Rad, MD Inpatient   06/28/2015 1657 07/03/2015 1412 Full Code [id number]  Marlyce Huge, MD ED   Advance Care Planning Activity     Family Communication: Spoke with wife on the phone Disposition  Plan: Looks like we are awaiting insurance authorization for [facility].  Potential discharge on Friday.  Antibiotics:  Cefepime  Flagyl  Time spent: 63  minutes  Cuma Polyakov [employer]

## 2019-12-02 DIAGNOSIS — K5700 Diverticulitis of small intestine with perforation and abscess without bleeding: Secondary | ICD-10-CM | POA: Diagnosis not present

## 2019-12-02 DIAGNOSIS — Z515 Encounter for palliative care: Secondary | ICD-10-CM | POA: Diagnosis not present

## 2019-12-02 DIAGNOSIS — E872 Acidosis: Secondary | ICD-10-CM | POA: Diagnosis not present

## 2019-12-02 DIAGNOSIS — F0150 Vascular dementia without behavioral disturbance: Secondary | ICD-10-CM | POA: Diagnosis not present

## 2019-12-02 DIAGNOSIS — N400 Enlarged prostate without lower urinary tract symptoms: Secondary | ICD-10-CM | POA: Diagnosis not present

## 2019-12-02 DIAGNOSIS — I5032 Chronic diastolic (congestive) heart failure: Secondary | ICD-10-CM | POA: Diagnosis not present

## 2019-12-02 DIAGNOSIS — N390 Urinary tract infection, site not specified: Secondary | ICD-10-CM | POA: Diagnosis not present

## 2019-12-02 DIAGNOSIS — E039 Hypothyroidism, unspecified: Secondary | ICD-10-CM | POA: Diagnosis not present

## 2019-12-02 DIAGNOSIS — D649 Anemia, unspecified: Secondary | ICD-10-CM | POA: Diagnosis not present

## 2019-12-02 DIAGNOSIS — I10 Essential (primary) hypertension: Secondary | ICD-10-CM | POA: Diagnosis not present

## 2019-12-02 DIAGNOSIS — E785 Hyperlipidemia, unspecified: Secondary | ICD-10-CM | POA: Diagnosis not present

## 2019-12-02 DIAGNOSIS — R001 Bradycardia, unspecified: Secondary | ICD-10-CM | POA: Diagnosis not present

## 2019-12-02 DIAGNOSIS — E119 Type 2 diabetes mellitus without complications: Secondary | ICD-10-CM | POA: Diagnosis not present

## 2019-12-02 DIAGNOSIS — M6281 Muscle weakness (generalized): Secondary | ICD-10-CM | POA: Diagnosis not present

## 2019-12-02 DIAGNOSIS — F0390 Unspecified dementia without behavioral disturbance: Secondary | ICD-10-CM | POA: Diagnosis not present

## 2019-12-02 DIAGNOSIS — Z7401 Bed confinement status: Secondary | ICD-10-CM | POA: Diagnosis not present

## 2019-12-02 DIAGNOSIS — R062 Wheezing: Secondary | ICD-10-CM | POA: Diagnosis not present

## 2019-12-02 DIAGNOSIS — Z1159 Encounter for screening for other viral diseases: Secondary | ICD-10-CM | POA: Diagnosis not present

## 2019-12-02 DIAGNOSIS — K858 Other acute pancreatitis without necrosis or infection: Secondary | ICD-10-CM | POA: Diagnosis not present

## 2019-12-02 DIAGNOSIS — N179 Acute kidney failure, unspecified: Secondary | ICD-10-CM | POA: Diagnosis not present

## 2019-12-02 DIAGNOSIS — R319 Hematuria, unspecified: Secondary | ICD-10-CM | POA: Diagnosis not present

## 2019-12-02 DIAGNOSIS — M2550 Pain in unspecified joint: Secondary | ICD-10-CM | POA: Diagnosis not present

## 2019-12-02 DIAGNOSIS — I959 Hypotension, unspecified: Secondary | ICD-10-CM | POA: Diagnosis not present

## 2019-12-02 DIAGNOSIS — N189 Chronic kidney disease, unspecified: Secondary | ICD-10-CM | POA: Diagnosis not present

## 2019-12-02 DIAGNOSIS — G20 Parkinson's disease: Secondary | ICD-10-CM | POA: Diagnosis not present

## 2019-12-02 DIAGNOSIS — N419 Inflammatory disease of prostate, unspecified: Secondary | ICD-10-CM | POA: Diagnosis not present

## 2019-12-02 DIAGNOSIS — R404 Transient alteration of awareness: Secondary | ICD-10-CM | POA: Diagnosis not present

## 2019-12-02 DIAGNOSIS — A419 Sepsis, unspecified organism: Secondary | ICD-10-CM | POA: Diagnosis not present

## 2019-12-02 LAB — BASIC METABOLIC PANEL
Anion gap: 10 (ref 5–15)
BUN: 11 mg/dL (ref 8–23)
CO2: 27 mmol/L (ref 22–32)
Calcium: 8.2 mg/dL — ABNORMAL LOW (ref 8.9–10.3)
Chloride: 103 mmol/L (ref 98–111)
Creatinine, Ser: 0.8 mg/dL (ref 0.61–1.24)
GFR calc Af Amer: >60 mL/min (ref >60)
GFR calc non Af Amer: >60 mL/min (ref >60)
Glucose, Bld: 131 mg/dL — ABNORMAL HIGH (ref 70–99)
Potassium: 3.3 mmol/L — ABNORMAL LOW (ref 3.5–5.1)
Sodium: 140 mmol/L (ref 135–145)

## 2019-12-02 LAB — CBC WITH DIFFERENTIAL/PLATELET
Abs Immature Granulocytes: 0.09 10*3/uL — ABNORMAL HIGH (ref 0.00–0.07)
Basophils Absolute: 0 10*3/uL (ref 0.0–0.1)
Basophils Relative: 0 %
Eosinophils Absolute: 0.1 10*3/uL (ref 0.0–0.5)
Eosinophils Relative: 1 %
HCT: 41 % (ref 39.0–52.0)
Hemoglobin: 13.8 g/dL (ref 13.0–17.0)
Immature Granulocytes: 1 %
Lymphocytes Relative: 13 %
Lymphs Abs: 1.3 10*3/uL (ref 0.7–4.0)
MCH: 29.1 pg (ref 26.0–34.0)
MCHC: 33.7 g/dL (ref 30.0–36.0)
MCV: 86.5 fL (ref 80.0–100.0)
Monocytes Absolute: 1.5 10*3/uL — ABNORMAL HIGH (ref 0.1–1.0)
Monocytes Relative: 15 %
Neutro Abs: 7 10*3/uL (ref 1.7–7.7)
Neutrophils Relative %: 70 %
Platelets: 205 10*3/uL (ref 150–400)
RBC: 4.74 MIL/uL (ref 4.22–5.81)
RDW: 14.1 % (ref 11.5–15.5)
WBC: 10 10*3/uL (ref 4.0–10.5)
nRBC: 0 % (ref 0.0–0.2)

## 2019-12-02 LAB — SARS CORONAVIRUS 2 (TAT 6-24 HRS): SARS Coronavirus 2: NEGATIVE

## 2019-12-02 MED ORDER — POTASSIUM CHLORIDE 20 MEQ PO PACK
40.0000 meq | PACK | Freq: Once | ORAL | Status: AC
  Administered 2019-12-02: 12:00:00 40 meq via ORAL
  Filled 2019-12-02: qty 2

## 2019-12-02 MED ORDER — IPRATROPIUM-ALBUTEROL 0.5-2.5 (3) MG/3ML IN SOLN: 3.0000 mL | Freq: Four times a day (QID) | RESPIRATORY_TRACT | 0 refills | Status: DC

## 2019-12-02 MED ORDER — QUETIAPINE FUMARATE 25 MG PO TABS: 25.0000 mg | ORAL_TABLET | Freq: Every day | ORAL | 0 refills | Status: DC

## 2019-12-02 MED ORDER — POLYETHYLENE GLYCOL 3350 17 G PO PACK: 17.0000 g | PACK | Freq: Every day | ORAL | 0 refills | Status: DC | PRN

## 2019-12-02 MED ORDER — CEFDINIR 300 MG PO CAPS: 300.0000 mg | ORAL_CAPSULE | Freq: Two times a day (BID) | ORAL | 0 refills | Status: AC

## 2019-12-02 MED ORDER — ASPIRIN 81 MG PO TBEC: 81.0000 mg | DELAYED_RELEASE_TABLET | Freq: Every day | ORAL | 0 refills | Status: DC

## 2019-12-02 MED ORDER — ACETAMINOPHEN 325 MG PO TABS: 650.0000 mg | ORAL_TABLET | Freq: Four times a day (QID) | ORAL | Status: DC | PRN

## 2019-12-02 NOTE — Progress Notes (Signed)
Attempted to call report to [facility], nurse answered and placed on hold for 5 minutes. Will attempt to call report again.

## 2019-12-02 NOTE — Discharge Summary (Signed)
[facility] NAME: David Morgan    MR#:  [place]:[id number]  DATE OF BIRTH: 86 December 08, 1924  DATE OF ADMISSION:  11/28/2019 ADMITTING PHYSICIAN: Artist Beach, MD  DATE OF DISCHARGE: 12/02/2014  PRIMARY CARE PHYSICIAN: [name], MD    ADMISSION DIAGNOSIS:  Sepsis associated hypotension ([place]) [A41.9, I95.9] Sepsis, due to unspecified organism, unspecified whether acute organ dysfunction present ([place]) [A41.9]  DISCHARGE DIAGNOSIS:  Active Problems:   Chronic dementia without behavioral disturbance (HCC)   Acute kidney injury superimposed on CKD ([place])   Sepsis associated hypotension (HCC)   Sepsis ([place])   Goals of care, counseling/discussion   Palliative care by specialist   Acute pancreatitis   Bradycardia   Lactic acidosis   Chronic diastolic CHF (congestive heart failure) ([place])   SECONDARY DIAGNOSIS:   Past Medical History:  Diagnosis Date  . Arthritis   . Cancer ([place])   . Claudication ([place])   . Diverticulitis   . Emphysema/COPD ([place])   . History of bladder cancer   . History of carotid artery stenosis   . History of TIA (transient ischemic attack)    2012--  NO RESIDUAL (PER SCAN HAD A PREVIOUS TIA BEFORE 2012)  . Hyperlipidemia   . Hypertension   . Hypothyroidism   . Lesion of bladder   . Mild asthma    NO INHALER  . Nocturia   . Peripheral vascular disease ([place])   . Short of breath on exertion   . Urgency of urination   . Wears glasses     HOSPITAL COURSE:   1.  Sepsis present on admission with hypotension.  Gallbladder cause.  Patient was given antibiotics since admission with cefepime and Flagyl.  Switched over to Rocephin for today.  I will continue Omnicef for 2 more days and get rid of the Flagyl at this point. 2.  Acute pancreatitis status post ERCP.  Lipase trended better. 3.  Acute kidney injury.  This has improved with IV fluids.  He has underlying chronic kidney disease stage II. 4.   Lactic acid on presentation 5.  Chronic dementia.  Palliative care did see the patient here in the hospital.  The patient is a DO NOT RESUSCITATE.  Overall prognosis is poor.  Can follow with hospice at facility.  Seroquel prescribed at night so he sleeps. 6.  Chronic diastolic congestive heart failure.  I repeated a chest x-ray because he did have some shortness of breath and it did not show any signs of congestive heart failure I did start nebulizer treatment with upper airway congestion. 7.  Hypothyroidism unspecified on levothyroxine 8.  BPH on Flomax 9.  Essential hypertension on Coreg at this point 10.  Patient will be followed by hospice at facility. 11.  Scab on left heel.  I would not call this acute decubiti but do heel protection to prevent decubiti.  DISCHARGE CONDITIONS:   Fair  CONSULTS OBTAINED:  Treatment Team:  Benjamine Sprague, DO  DRUG ALLERGIES:   Allergies  Allergen Reactions  . Adhesive [Tape] Rash  . Augmentin [Amoxicillin-Pot Clavulanate] Itching, Rash and Other (See Comments)    Has patient had a PCN reaction causing immediate rash, facial/tongue/throat swelling, SOB or lightheadedness with hypotension: No Has patient had a PCN reaction causing severe rash involving mucus membranes or skin necrosis: No Has patient had a PCN reaction that required hospitalization No Has patient had a PCN reaction occurring within the last 10 years: No If all  of the above answers are "NO", then may proceed with Cephalosporin use.     DISCHARGE MEDICATIONS:   Allergies as of 84/16/2021      Reactions   Adhesive [tape] Rash   Augmentin [amoxicillin-pot Clavulanate] Itching, Rash, Other (See Comments)   Has patient had a PCN reaction causing immediate rash, facial/tongue/throat swelling, SOB or lightheadedness with hypotension: No Has patient had a PCN reaction causing severe rash involving mucus membranes or skin necrosis: No Has patient had a PCN reaction that required  hospitalization No Has patient had a PCN reaction occurring within the last 10 years: No If all of the above answers are "NO", then may proceed with Cephalosporin use.      Medication List    STOP taking these medications   ciprofloxacin 500 MG tablet Commonly known as: CIPRO   hydrochlorothiazide 12.5 MG tablet Commonly known as: HYDRODIURIL   ketoconazole 2 % cream Commonly known as: NIZORAL   losartan 50 MG tablet Commonly known as: COZAAR   metFORMIN 500 MG tablet Commonly known as: GLUCOPHAGE   metroNIDAZOLE 500 MG tablet Commonly known as: FLAGYL     TAKE these medications   acetaminophen 325 MG tablet Commonly known as: TYLENOL Take 2 tablets (650 mg total) by mouth every 6 (six) hours as needed for mild pain (or Fever >/= 101).   aspirin 81 MG EC tablet Take 1 tablet (81 mg total) by mouth daily.   carvedilol 3.125 MG tablet Commonly known as: COREG Take 3.125 mg by mouth 2 (two) times daily with a meal.   cefdinir 300 MG capsule Commonly known as: OMNICEF Take 1 capsule (300 mg total) by mouth 2 (two) times daily for 2 days. Start taking on: December 03, 2019   donepezil 5 MG tablet Commonly known as: ARICEPT Take 5 mg by mouth daily.   ipratropium-albuterol 0.5-2.5 (3) MG/3ML Soln Commonly known as: DUONEB Take 3 mLs by nebulization every 6 (six) hours.   levothyroxine 112 MCG tablet Commonly known as: SYNTHROID Take 112 mcg by mouth daily before breakfast.   Multi-Vitamins Tabs Take 1 tablet by mouth daily.   polyethylene glycol 17 g packet Commonly known as: MIRALAX / GLYCOLAX Take 17 g by mouth daily as needed.   QUEtiapine 25 MG tablet Commonly known as: SEROQUEL Take 1 tablet (25 mg total) by mouth at bedtime.   tamsulosin 0.4 MG Caps capsule Commonly known as: FLOMAX Take 0.4 mg by mouth every evening.        DISCHARGE INSTRUCTIONS:   Follow-up with team at rehab 1 day  If you experience worsening of your admission symptoms,  develop shortness of breath, life threatening emergency, suicidal or homicidal thoughts you must seek medical attention immediately by calling 911 or calling your MD immediately  if symptoms less severe.  You Must read complete instructions/literature along with all the possible adverse reactions/side effects for all the Medicines you take and that have been prescribed to you. Take any new Medicines after you have completely understood and accept all the possible adverse reactions/side effects.   Please note  You were cared for by a hospitalist during your hospital stay. If you have any questions about your discharge medications or the care you received while you were in the hospital after you are discharged, you can call the unit and asked to speak with the hospitalist on call if the hospitalist that took care of you is not available. Once you are discharged, your primary care physician will handle any  further medical issues. Please note that NO REFILLS for any discharge medications will be authorized once you are discharged, as it is imperative that you return to your primary care physician (or establish a relationship with a primary care physician if you do not have one) for your aftercare needs so that they can reassess your need for medications and monitor your lab values.    Today   CHIEF COMPLAINT:   Chief Complaint  Patient presents with  . Fall    HISTORY OF PRESENT ILLNESS:  Shakir Isenhour  is a 86 y.o. male presented after a fall   VITAL SIGNS:  Blood pressure 124/82, pulse 82, temperature 98.2 F (36.8 C), resp. rate 18, height 5\' 9"  (1.753 m), weight 103.9 kg, SpO2 100 %.   PHYSICAL EXAMINATION:  GENERAL:  86 y.o.-year-old patient lying in the bed with no acute distress.  EYES: Pupils equal, round, reactive to light and accommodation. No scleral icterus.  HEENT: Head atraumatic, normocephalic. Oropharynx and nasopharynx clear.  LUNGS: Normal breath sounds bilaterally,  no wheezing, rales,rhonchi or crepitation. No use of accessory muscles of respiration.  CARDIOVASCULAR: S1, S2 normal. No murmurs, rubs, or gallops.  ABDOMEN: Soft, non-tender, non-distended. Bowel sounds present. No organomegaly or mass.  EXTREMITIES: No pedal edema, cyanosis, or clubbing.  NEUROLOGIC: Patient unable to straight leg raise bilaterally. PSYCHIATRIC: The patient is alert and oriented x 3.  SKIN: Chronic lower extremity skin discoloration.  Scab on left heel.  DATA REVIEW:   CBC Recent Labs  Lab 12/02/19 0429  WBC 10.0  HGB 13.8  HCT 41.0  PLT 205    Chemistries  Recent Labs  Lab 11/29/19 0534 11/29/19 0534 11/30/19 0041 11/30/19 0041 12/02/19 0429  NA 138   < > 138   < > 140  K 3.1*   < > 3.7   < > 3.3*  CL 105   < > 108   < > 103  CO2 24   < > 24   < > 27  GLUCOSE 173*   < > 144*   < > 131*  BUN 51*   < > 24*   < > 11  CREATININE 1.21   < > 0.92   < > 0.80  CALCIUM 7.4*   < > 7.8*   < > 8.2*  MG  --   --  2.0  --   --   AST 23  --   --   --   --   ALT 28  --   --   --   --   ALKPHOS 58  --   --   --   --   BILITOT 1.5*  --   --   --   --    < > = values in this interval not displayed.    Microbiology Results  Results for orders placed or performed during the hospital encounter of 11/28/19  Blood Culture (routine x 2)     Status: None (Preliminary result)   Collection Time: 11/28/19  8:04 PM   Specimen: BLOOD  Result Value Ref Range Status   Specimen Description BLOOD BLOOD LEFT HAND  Final   Special Requests   Final    BOTTLES DRAWN AEROBIC AND ANAEROBIC Blood Culture adequate volume   Culture   Final    NO GROWTH 4 DAYS Performed at [facility], [address], [place], [place] [address]    Report Status PENDING  Incomplete  Blood Culture (routine  x 2)     Status: None (Preliminary result)   Collection Time: 11/28/19  8:04 PM   Specimen: BLOOD  Result Value Ref Range Status   Specimen Description BLOOD RIGHT ANTECUBITAL  Final    Special Requests   Final    BOTTLES DRAWN AEROBIC AND ANAEROBIC Blood Culture adequate volume   Culture   Final    NO GROWTH 4 DAYS Performed at [facility], [address], [place], [place] [address]    Report Status PENDING  Incomplete  Respiratory Panel by RT PCR (Flu A&B, Covid) - Nasopharyngeal Swab     Status: None   Collection Time: 11/28/19  8:11 PM   Specimen: Nasopharyngeal Swab  Result Value Ref Range Status   SARS Coronavirus 2 by RT PCR NEGATIVE NEGATIVE Final    Comment: (NOTE) SARS-CoV-2 target nucleic acids are NOT DETECTED. The SARS-CoV-2 RNA is generally detectable in upper respiratoy specimens during the acute phase of infection. The lowest concentration of SARS-CoV-2 viral copies this assay can detect is 131 copies/mL. A negative result does not preclude SARS-Cov-2 infection and should not be used as the sole basis for treatment or other patient management decisions. A negative result may occur with  improper specimen collection/handling, submission of specimen other than nasopharyngeal swab, presence of viral mutation(s) within the areas targeted by this assay, and inadequate number of viral copies (<131 copies/mL). A negative result must be combined with clinical observations, patient history, and epidemiological information. The expected result is Negative. Fact Sheet for Patients:  PinkCheek.be Fact Sheet for Healthcare Providers:  GravelBags.it This test is not yet ap proved or cleared by the [place] FDA and  has been authorized for detection and/or diagnosis of SARS-CoV-2 by FDA under an Emergency Use Authorization (EUA). This EUA will remain  in effect (meaning this test can be used) for the duration of the COVID-19 declaration under Section 564(b)(1) of the Act, 21 U.S.C. section 360bbb-3(b)(1), unless the authorization is terminated or revoked sooner.    Influenza A by PCR  NEGATIVE NEGATIVE Final   Influenza B by PCR NEGATIVE NEGATIVE Final    Comment: (NOTE) The Xpert Xpress SARS-CoV-2/FLU/RSV assay is intended as an aid in  the diagnosis of influenza from Nasopharyngeal swab specimens and  should not be used as a sole basis for treatment. Nasal washings and  aspirates are unacceptable for Xpert Xpress SARS-CoV-2/FLU/RSV  testing. Fact Sheet for Patients: PinkCheek.be Fact Sheet for Healthcare Providers: GravelBags.it This test is not yet approved or cleared by the [place] FDA and  has been authorized for detection and/or diagnosis of SARS-CoV-2 by  FDA under an Emergency Use Authorization (EUA). This EUA will remain  in effect (meaning this test can be used) for the duration of the  Covid-19 declaration under Section 564(b)(1) of the Act, 21  U.S.C. section 360bbb-3(b)(1), unless the authorization is  terminated or revoked. Performed at [facility], [address] [place], [place] [address]   MRSA PCR Screening     Status: None   Collection Time: 11/29/19  4:35 AM   Specimen: Nasopharyngeal  Result Value Ref Range Status   MRSA by PCR NEGATIVE NEGATIVE Final    Comment:        The GeneXpert MRSA Assay (FDA approved for NASAL specimens only), is one component of a comprehensive MRSA colonization surveillance program. It is not intended to diagnose MRSA infection nor to guide or monitor treatment for MRSA infections. Performed at [facility], [phone] [place], [place] [address]   SARS CORONAVIRUS 2 (TAT 6-24 HRS) Nasopharyngeal Nasopharyngeal Swab     Status: None   Collection Time: 12/01/19  7:48 PM   Specimen: Nasopharyngeal Swab  Result Value Ref Range Status   SARS Coronavirus 2 NEGATIVE NEGATIVE Final    Comment: (NOTE) SARS-CoV-2 target nucleic acids are NOT DETECTED. The SARS-CoV-2 RNA is generally detectable in upper and lower respiratory  specimens during the acute phase of infection. Negative results do not preclude SARS-CoV-2 infection, do not rule out co-infections with other pathogens, and should not be used as the sole basis for treatment or other patient management decisions. Negative results must be combined with clinical observations, patient history, and epidemiological information. The expected result is Negative. Fact Sheet for Patients: SugarRoll.be Fact Sheet for Healthcare Providers: https://www.woods-mathews.com/ This test is not yet approved or cleared by the [place] FDA and  has been authorized for detection and/or diagnosis of SARS-CoV-2 by FDA under an Emergency Use Authorization (EUA). This EUA will remain  in effect (meaning this test can be used) for the duration of the COVID-19 declaration under Section 56 4(b)(1) of the Act, 21 U.S.C. section 360bbb-3(b)(1), unless the authorization is terminated or revoked sooner. Performed at [facility], [place] [address] [place], [place] [address]     RADIOLOGY:  DG Chest Port 1 View  Result Date: 11/30/2019 CLINICAL DATA:  86 year old male with cough. History of bladder cancer. EXAM: PORTABLE CHEST 1 VIEW COMPARISON:  Portable chest 11/28/2019 and earlier. CT Chest, Abdomen, and Pelvis 11/18/2019. FINDINGS: Portable AP semi upright view at 1235 hours. Lower lung volumes. Stable cardiac size and mediastinal contours. Increased hilar and basilar crowding of lung markings/atelectasis. No superimposed pulmonary edema suspected. No pneumothorax or consolidation. No pleural effusion is evident. Visualized tracheal air column is within normal limits. Stable visualized osseous structures. Paucity of bowel gas in the upper abdomen. IMPRESSION: Low lung volumes with atelectasis. Electronically Signed   By: Genevie Ann M.D.   On: 11/30/2019 14:41   US Abdomen Limited RUQ  Result Date: 12/01/2019 CLINICAL DATA:  Right upper  quadrant abdominal pain. EXAM: ULTRASOUND ABDOMEN LIMITED RIGHT UPPER QUADRANT COMPARISON:  November 29, 2019. FINDINGS: Gallbladder: Cholelithiasis is noted with largest stone measuring 6 mm. No gallbladder wall thickening or pericholecystic fluid is noted. No sonographic Murphy's sign is noted. Common bile duct: Diameter: 6 mm which is within normal limits. Liver: No focal lesion identified. Within normal limits in parenchymal echogenicity. Portal vein is patent on color Doppler imaging with normal direction of blood flow towards the liver. Other: None. IMPRESSION: Cholelithiasis without evidence of cholecystitis. No other abnormality seen in the right upper quadrant of the abdomen. Electronically Signed   By: Marijo Conception M.D.   On: 12/01/2019 10:48     Management plans discussed with the patient, family and they are in agreement.  CODE STATUS:     Code Status Orders  (From admission, onward)         Start     Ordered   11/29/19 0000  Do not attempt resuscitation (DNR)  Continuous    Question Answer Comment  In the event of cardiac or respiratory ARREST Do not call a "code blue"   In the event of cardiac or respiratory ARREST Do not perform Intubation, CPR, defibrillation or ACLS   In the event of cardiac or respiratory ARREST Use medication by any route, position, wound care, and other measures to relive pain and  suffering. May use oxygen, suction and manual treatment of airway obstruction as needed for comfort.      11/28/19 2359        Code Status History    Date Active Date Inactive Code Status Order ID Comments User Context   11/28/2019 2359 11/28/2019 2359 Full Code [id number]  Artist Beach, MD ED   11/25/2019 1428 11/26/2019 2205 DNR [id number]  Domenic Polite, MD Inpatient   11/18/2019 2242 11/25/2019 1428 Full Code [id number]  Bernadette Hoit, DO Inpatient   12/21/2017 1300 12/22/2017 2105 Full Code [id number]  Henreitta Leber, MD Inpatient   09/19/2015 1150 09/21/2015 1950 Full Code  [id number]  Inez Catalina, MD Inpatient   09/18/2015 1817 09/19/2015 1150 Full Code [id number]  Hubbard Robinson, MD ED   09/03/2015 1919 09/06/2015 1833 Full Code [id number]  Hubbard Robinson, MD Inpatient   09/03/2015 1046 09/03/2015 1919 Full Code [id number]  Hubbard Robinson, MD ED   09/03/2015 1046 09/03/2015 1046 Full Code [id number]  Hubbard Robinson, MD ED   08/06/2015 2031 08/10/2015 1759 Full Code [id number]  Sherri Rad, MD Inpatient   06/28/2015 1657 07/03/2015 1412 Full Code [id number]  Marlyce Huge, MD ED   Advance Care Planning Activity      TOTAL TIME TAKING CARE OF THIS PATIENT: 35 minutes.    Loletha Grayer M.D on 12/02/2019 at 10:13 AM  Between 7am to 6pm - Pager - [phone]  After 6pm go to [email or web address] - password [email or web address]  [facility]  CC: Primary care physician; [name], MD

## 2019-12-02 NOTE — Progress Notes (Signed)
Telephone report given to Elmyra Ricks, RN at [facility].

## 2019-12-02 NOTE — TOC Transition Note (Signed)
Transition of Care [facility]) - CM/SW Discharge Note   Patient Details  Name: David Morgan MRN: [place]:[id number] Date of Birth: 02-22-1925  [facility] CM/SW Contact:  Shelbie Ammons, RN Phone Number: 12/02/2019, 12:07 PM   Clinical Narrative:   RNCM contacted MD re discharge and facility to assure they were ready for patient. Patient already has auth for stay through insurance co. RNCM completed EMS paperwork and left DNR for signature by MD. Per Tammy at [facility] they are ready for patient and he will be going to room 601, phone number for report given to nurse [phone]. EMS called for transport.       Barriers to Discharge: Continued Medical Work up   Patient Goals and CMS Choice        Discharge Placement                       Discharge Plan and Services                            [facility] Agency: [place] Date [facility] Agency Contacted: 11/29/19   Representative spoke with at [employer]: [place] Determinants of Health ([place]) Interventions     Readmission Risk Interventions No flowsheet data found.

## 2019-12-02 NOTE — Progress Notes (Addendum)
Palliative:  Patient has been approved for rehab and is discharging today. Have been unable to follow up with wife to complete MOST form following our initial [place] conversation - see note 4/14. She has not been at bedside when I have checked on patient - multiple checks. Have been unable to reach her by phone. Have discussed patient with [facility] liaison - outpatient palliative will see at facility.   Juel Burrow, DNP, AGNP-C Palliative Medicine Team Team Phone # [phone]  Pager # [phone]  NO CHARGE

## 2019-12-03 LAB — CULTURE, BLOOD (ROUTINE X 2)
Culture: NO GROWTH
Culture: NO GROWTH
Special Requests: ADEQUATE
Special Requests: ADEQUATE

## 2019-12-06 DIAGNOSIS — E039 Hypothyroidism, unspecified: Secondary | ICD-10-CM | POA: Diagnosis not present

## 2019-12-06 DIAGNOSIS — F0390 Unspecified dementia without behavioral disturbance: Secondary | ICD-10-CM | POA: Diagnosis not present

## 2019-12-06 DIAGNOSIS — N400 Enlarged prostate without lower urinary tract symptoms: Secondary | ICD-10-CM | POA: Diagnosis not present

## 2019-12-06 DIAGNOSIS — A419 Sepsis, unspecified organism: Secondary | ICD-10-CM | POA: Diagnosis not present

## 2019-12-06 DIAGNOSIS — I10 Essential (primary) hypertension: Secondary | ICD-10-CM | POA: Diagnosis not present

## 2019-12-14 ENCOUNTER — Non-Acute Institutional Stay: Payer: PPO | Admitting: Primary Care

## 2019-12-14 ENCOUNTER — Other Ambulatory Visit: Payer: Self-pay

## 2019-12-14 DIAGNOSIS — Z515 Encounter for palliative care: Secondary | ICD-10-CM | POA: Diagnosis not present

## 2019-12-14 NOTE — Progress Notes (Signed)
[facility] Consult Note Telephone: [phone]  Fax: [phone]  PATIENT NAME: David Morgan [address] [place] [address] [phone] (home)  DOB: 1924/12/20 MRN: [id number]  PRIMARY CARE PROVIDER:    Juluis Pitch, MD,  [address] [place] [address] [phone]  REFERRING PROVIDER:   Juluis Pitch, MD [address] [place],  [place] [address] [phone]  RESPONSIBLE PARTY:   Extended Emergency Contact Information Primary Emergency Contact: Ure,[name] Address: [place], [place] [address] [place] Phone: [phone] Relation: Spouse   ASSESSMENT AND RECOMMENDATIONS:   1. Advance Care Planning/Goals of Care: Goals include to maximize quality of life and symptom management. MOST on file at nursing home with DNR, comfort measures, limited use of abx and IV, no feeding tube, signed by [facility] Mrs. Epp.  2. Symptom Management:  I met with Mr. Wagar in his room. It was difficult for him to communicate due to limited hearing. I will call his wife to discuss if he has hearing aides that can be brought to him, or if he needs assessment. He appears WNWD and comfortable.   3. Family /Caregiver/Community Supports: Wife is POA, in SNF.  4. Cognitive / Functional decline: A and O, h/o dementia, needs assistance with adls and all iadls.  5. Follow up Palliative Care Visit: Palliative care will continue to follow for goals of care clarification and symptom management. Return 4 weeks or prn.  I spent 25 minutes providing this consultation,  from 1330 to 1355. More than 50% of the time in this consultation was spent coordinating communication.   HISTORY OF PRESENT ILLNESS:  David Morgan is a 86 y.o. year old male with multiple medical problems including h/o sepsis, vascular dementia, hearing loss. Palliative Care was asked to follow this patient by consultation request of  Juluis Pitch, MD to help address advance care planning and goals of care. This is the initial visit.  CODE STATUS: DNR, comfort measures, limited abx and IV, no feeding tube.  PPS: 30% HOSPICE ELIGIBILITY/DIAGNOSIS: TBD  PAST MEDICAL HISTORY:  Past Medical History:  Diagnosis Date  . Arthritis   . Cancer ([place])   . Claudication ([place])   . Diverticulitis   . Emphysema/COPD ([place])   . History of bladder cancer   . History of carotid artery stenosis   . History of TIA (transient ischemic attack)    2012--  NO RESIDUAL (PER SCAN HAD A PREVIOUS TIA BEFORE 2012)  . Hyperlipidemia   . Hypertension   . Hypothyroidism   . Lesion of bladder   . Mild asthma    NO INHALER  . Nocturia   . Peripheral vascular disease ([place])   . Short of breath on exertion   . Urgency of urination   . Wears glasses     SOCIAL HX:  Social History   Tobacco Use  . Smoking status: Former Smoker    Packs/day: 1.00    Years: 40.00    Pack years: 40.00    Types: Cigarettes    Quit date: 12/21/1993    Years since quitting: 25.9  . Smokeless tobacco: Never Used  Substance Use Topics  . Alcohol use: No    Comment: RARE    ALLERGIES:  Allergies  Allergen Reactions  . Adhesive [Tape] Rash  . Augmentin [Amoxicillin-Pot Clavulanate] Itching, Rash and Other (See Comments)    Has patient  had a PCN reaction causing immediate rash, facial/tongue/throat swelling, SOB or lightheadedness with hypotension: No Has patient had a PCN reaction causing severe rash involving mucus membranes or skin necrosis: No Has patient had a PCN reaction that required hospitalization No Has patient had a PCN reaction occurring within the last 10 years: No If all of the above answers are "NO", then may proceed with Cephalosporin use.      PERTINENT MEDICATIONS:  Outpatient Encounter Medications as of 12/14/2019  Medication Sig  . acetaminophen (TYLENOL) 500 MG tablet Take 1,000 mg by mouth every 6 (six) hours as needed.  [name]  aspirin EC 81 MG EC tablet Take 1 tablet (81 mg total) by mouth daily.  . carvedilol (COREG) 3.125 MG tablet Take 3.125 mg by mouth 2 (two) times daily with a meal.  . Ipratropium-Albuterol (COMBIVENT RESPIMAT) 20-100 MCG/ACT AERS respimat Inhale 1 puff into the lungs every 6 (six) hours.  [name] levothyroxine (SYNTHROID, LEVOTHROID) 112 MCG tablet Take 112 mcg by mouth daily before breakfast.  . Multiple Vitamin (MULTI-VITAMINS) TABS Take 1 tablet by mouth daily.  . QUEtiapine (SEROQUEL) 25 MG tablet Take 1 tablet (25 mg total) by mouth at bedtime.  . tamsulosin (FLOMAX) 0.4 MG CAPS capsule Take 0.4 mg by mouth every evening.   . [DISCONTINUED] ipratropium-albuterol (DUONEB) 0.5-2.5 (3) MG/3ML SOLN Take 3 mLs by nebulization every 6 (six) hours.  . [DISCONTINUED] acetaminophen (TYLENOL) 325 MG tablet Take 2 tablets (650 mg total) by mouth every 6 (six) hours as needed for mild pain (or Fever >/= 101).  . [DISCONTINUED] donepezil (ARICEPT) 5 MG tablet Take 5 mg by mouth daily.  . [DISCONTINUED] polyethylene glycol (MIRALAX / GLYCOLAX) 17 g packet Take 17 g by mouth daily as needed.   No facility-administered encounter medications on file as of 12/14/2019.   PHYSICAL EXAM / ROS:   Current and past weights: 214 LBS, STABLE  General: NAD, frail appearing, WNWD, Very HOH Cardiovascular: no chest pain reported, no edema  Pulmonary: no cough, no increased SOB, room air Abdomen: appetite good, denies constipation, continent of bowel GU: denies dysuria, continent of urine MSK:  +  joint and ROM abnormalities, ambulatory with help Skin: no rashes or wounds reported Neurological: Weakness, sensory loss - hearing. Denies pain.  Jason Coop, NP [facility]  COVID-19 PATIENT SCREENING TOOL  Person answering questions: ____________Staff_______ _____   1.  Is the patient or any family member in the home showing any signs or symptoms regarding respiratory infection?               Person with  Symptom- __________NA_________________  a. Fever                                                                          Yes___ No___          ___________________  b. Shortness of breath                                                    Yes___ No___  ___________________ c. Cough/congestion                                       Yes___  No___         ___________________ d. Body aches/pains                                                         Yes___ No___        ____________________ e. Gastrointestinal symptoms (diarrhea, nausea)           Yes___ No___        ____________________  2. Within the past 14 days, has anyone living in the home had any contact with someone with or under investigation for COVID-19?    Yes___ No_X_   Person __________________

## 2019-12-15 DIAGNOSIS — A419 Sepsis, unspecified organism: Secondary | ICD-10-CM | POA: Diagnosis not present

## 2019-12-15 DIAGNOSIS — E119 Type 2 diabetes mellitus without complications: Secondary | ICD-10-CM | POA: Diagnosis not present

## 2019-12-15 DIAGNOSIS — E039 Hypothyroidism, unspecified: Secondary | ICD-10-CM | POA: Diagnosis not present

## 2019-12-15 DIAGNOSIS — F0390 Unspecified dementia without behavioral disturbance: Secondary | ICD-10-CM | POA: Diagnosis not present

## 2019-12-15 DIAGNOSIS — N400 Enlarged prostate without lower urinary tract symptoms: Secondary | ICD-10-CM | POA: Diagnosis not present

## 2019-12-15 DIAGNOSIS — I10 Essential (primary) hypertension: Secondary | ICD-10-CM | POA: Diagnosis not present

## 2019-12-29 DIAGNOSIS — M810 Age-related osteoporosis without current pathological fracture: Secondary | ICD-10-CM | POA: Diagnosis not present

## 2019-12-29 DIAGNOSIS — F0390 Unspecified dementia without behavioral disturbance: Secondary | ICD-10-CM | POA: Diagnosis not present

## 2019-12-29 DIAGNOSIS — Z66 Do not resuscitate: Secondary | ICD-10-CM | POA: Diagnosis not present

## 2019-12-29 DIAGNOSIS — E039 Hypothyroidism, unspecified: Secondary | ICD-10-CM | POA: Diagnosis not present

## 2019-12-29 DIAGNOSIS — I10 Essential (primary) hypertension: Secondary | ICD-10-CM | POA: Diagnosis not present

## 2019-12-29 DIAGNOSIS — E119 Type 2 diabetes mellitus without complications: Secondary | ICD-10-CM | POA: Diagnosis not present

## 2019-12-29 DIAGNOSIS — N400 Enlarged prostate without lower urinary tract symptoms: Secondary | ICD-10-CM | POA: Diagnosis not present

## 2019-12-29 DIAGNOSIS — A419 Sepsis, unspecified organism: Secondary | ICD-10-CM | POA: Diagnosis not present

## 2020-01-11 ENCOUNTER — Other Ambulatory Visit: Payer: Self-pay

## 2020-01-11 ENCOUNTER — Non-Acute Institutional Stay: Payer: PPO | Admitting: Primary Care

## 2020-01-12 ENCOUNTER — Other Ambulatory Visit: Payer: Self-pay

## 2020-01-12 ENCOUNTER — Non-Acute Institutional Stay: Payer: PPO | Admitting: Primary Care

## 2020-01-12 DIAGNOSIS — F0390 Unspecified dementia without behavioral disturbance: Secondary | ICD-10-CM | POA: Diagnosis not present

## 2020-01-12 DIAGNOSIS — Z515 Encounter for palliative care: Secondary | ICD-10-CM | POA: Diagnosis not present

## 2020-01-12 NOTE — Progress Notes (Signed)
[facility] Consult Note Telephone: [phone]  Fax: [phone]  PATIENT NAME: David Morgan [address] [place] [address] [phone] (home)  DOB: 07-28-1925 MRN: [id number]  PRIMARY CARE PROVIDER:    Juluis Pitch, MD,  [address] [place] [address] [phone]  REFERRING PROVIDER:   Juluis Pitch, MD [address] [place],  [place] [address] [phone]  RESPONSIBLE PARTY:   Extended Emergency Contact Information Primary Emergency Contact: Bartolo,[name] Address: [place], [place] [address] [place] Phone: [phone] Relation: Spouse    I met with patient in the  facility.  ASSESSMENT AND RECOMMENDATIONS:   1. Advance Care Planning/Goals of Care: Goals include to maximize quality of life and symptom management. MOST on file at nursing home with DNR, comfort measures, limited use of abx and IV, no feeding tube, signed by [facility] Mrs. Messer.  2. Symptom Management:   I met with Mr. Halpin today in his room. He appeared comfortable. He stated he did not walk but occasionally he did get up in the wheelchair. He denies pain. He is hard of hearing and does own hearing aids. However the skilled facility has not received orders to use them. I placed a call to his wife and power of attorney to see if she was going to bring them for his use. I believe this would help with sensory deprivation and improve his quality of life. I left a message with his wife and POA and invited her to call deck with any questions or concerns.  3. Family /Caregiver/Community Supports: Wife is POA. Pt living in SNF for now.   4. Cognitive / Functional decline: A and O x 1 , h/o dementia, needs assistance with adls and all iadls.  5. Follow up Palliative Care Visit: Palliative care will continue to follow for goals of care clarification and symptom management. Return 8 weeks or prn.  I spent 25  minutes providing this consultation,  from 1435 to 1500. More than 50% of the time in this consultation was spent coordinating communication.   HISTORY OF PRESENT ILLNESS:  David Morgan is a 86 y.o. year old male with multiple medical problems including h/o sepsis, vascular dementia, hearing loss. Palliative Care was asked to follow this patient by consultation request of Juluis Pitch, MD to help address advance care planning and goals of care. This is a follow up visit.  CODE STATUS: DNR, comfort measures, limited abx and IV, no feeding tube.  PPS: 30% HOSPICE ELIGIBILITY/DIAGNOSIS: TBD  PAST MEDICAL HISTORY:  Past Medical History:  Diagnosis Date  . Arthritis   . Cancer ([place])   . Claudication ([place])   . Diverticulitis   . Emphysema/COPD ([place])   . History of bladder cancer   . History of carotid artery stenosis   . History of TIA (transient ischemic attack)    2012--  NO RESIDUAL (PER SCAN HAD A PREVIOUS TIA BEFORE 2012)  . Hyperlipidemia   . Hypertension   . Hypothyroidism   . Lesion of bladder   . Mild asthma    NO INHALER  . Nocturia   . Peripheral vascular disease ([place])   . Short of breath on exertion   . Urgency of urination   . Wears glasses     SOCIAL HX:  Social History   Tobacco Use  . Smoking status: Former Smoker    Packs/day:  1.00    Years: 40.00    Pack years: 40.00    Types: Cigarettes    Quit date: 12/21/1993    Years since quitting: 26.0  . Smokeless tobacco: Never Used  Substance Use Topics  . Alcohol use: No    Comment: RARE    ALLERGIES:  Allergies  Allergen Reactions  . Adhesive [Tape] Rash  . Augmentin [Amoxicillin-Pot Clavulanate] Itching, Rash and Other (See Comments)    Has patient had a PCN reaction causing immediate rash, facial/tongue/throat swelling, SOB or lightheadedness with hypotension: No Has patient had a PCN reaction causing severe rash involving mucus membranes or skin necrosis: No Has patient had a PCN reaction that  required hospitalization No Has patient had a PCN reaction occurring within the last 10 years: No If all of the above answers are "NO", then may proceed with Cephalosporin use.      PERTINENT MEDICATIONS:  Outpatient Encounter Medications as of 01/12/2020  Medication Sig  . acetaminophen (TYLENOL) 500 MG tablet Take 1,000 mg by mouth every 6 (six) hours as needed.  [name] aspirin EC 81 MG EC tablet Take 1 tablet (81 mg total) by mouth daily.  . carvedilol (COREG) 3.125 MG tablet Take 3.125 mg by mouth 2 (two) times daily with a meal.  . Ipratropium-Albuterol (COMBIVENT RESPIMAT) 20-100 MCG/ACT AERS respimat Inhale 1 puff into the lungs every 6 (six) hours.  [name] levothyroxine (SYNTHROID, LEVOTHROID) 112 MCG tablet Take 112 mcg by mouth daily before breakfast.  . Multiple Vitamin (MULTI-VITAMINS) TABS Take 1 tablet by mouth daily.  . QUEtiapine (SEROQUEL) 25 MG tablet Take 1 tablet (25 mg total) by mouth at bedtime.  . tamsulosin (FLOMAX) 0.4 MG CAPS capsule Take 0.4 mg by mouth every evening.    No facility-administered encounter medications on file as of 01/12/2020.    PHYSICAL EXAM / ROS:   Current and past weights: 216 lbs, 214 lbs on admisson General: NAD, frail appearing, WNWD Cardiovascular: no chest pain reported, no edema  Pulmonary: no cough, no increased SOB, room air Abdomen: appetite good, denies constipation, incontinent of bowel GU: denies dysuria, incontinent of urine MSK:  + joint and ROM abnormalities, non-ambulatory Skin: no rashes or wounds reported Neurological: Weakness, dementia   Jason Coop, NP [facility]  COVID-19 PATIENT SCREENING TOOL  Person answering questions: ____________Staff_______ _____   1.  Is the patient or any family member in the home showing any signs or symptoms regarding respiratory infection?               Person with Symptom- __________NA_________________  a. Fever                                                                           Yes___ No___          ___________________  b. Shortness of breath                                                    Yes___ No___          ___________________ c. Cough/congestion  Yes___  No___         ___________________ d. Body aches/pains                                                         Yes___ No___        ____________________ e. Gastrointestinal symptoms (diarrhea, nausea)           Yes___ No___        ____________________  2. Within the past 14 days, has anyone living in the home had any contact with someone with or under investigation for COVID-19?    Yes___ No_X_   Person __________________   

## 2020-01-13 ENCOUNTER — Telehealth: Payer: Self-pay | Admitting: Primary Care

## 2020-01-13 NOTE — Telephone Encounter (Signed)
T/c from Denyse Dago, states she does see he is comfortable. States he thinks she is his mother. She understands his dementia. She states he does not have hearing aids. He has refused to use them. Wife uses an amplifier. Asked her to ask ST to assess and obtain if appropriate. She is applying for LTC medicaid.

## 2020-01-17 DIAGNOSIS — F0390 Unspecified dementia without behavioral disturbance: Secondary | ICD-10-CM | POA: Diagnosis not present

## 2020-01-17 DIAGNOSIS — E119 Type 2 diabetes mellitus without complications: Secondary | ICD-10-CM | POA: Diagnosis not present

## 2020-01-17 DIAGNOSIS — E039 Hypothyroidism, unspecified: Secondary | ICD-10-CM | POA: Diagnosis not present

## 2020-01-17 DIAGNOSIS — K859 Acute pancreatitis without necrosis or infection, unspecified: Secondary | ICD-10-CM | POA: Diagnosis not present

## 2020-01-17 DIAGNOSIS — N400 Enlarged prostate without lower urinary tract symptoms: Secondary | ICD-10-CM | POA: Diagnosis not present

## 2020-01-17 DIAGNOSIS — A419 Sepsis, unspecified organism: Secondary | ICD-10-CM | POA: Diagnosis not present

## 2020-01-17 DIAGNOSIS — Z66 Do not resuscitate: Secondary | ICD-10-CM | POA: Diagnosis not present

## 2020-01-17 DIAGNOSIS — M810 Age-related osteoporosis without current pathological fracture: Secondary | ICD-10-CM | POA: Diagnosis not present

## 2020-01-17 DIAGNOSIS — I10 Essential (primary) hypertension: Secondary | ICD-10-CM | POA: Diagnosis not present

## 2020-02-10 ENCOUNTER — Other Ambulatory Visit: Payer: Self-pay

## 2020-02-10 ENCOUNTER — Non-Acute Institutional Stay: Payer: PPO | Admitting: Primary Care

## 2020-02-10 DIAGNOSIS — F0390 Unspecified dementia without behavioral disturbance: Secondary | ICD-10-CM | POA: Diagnosis not present

## 2020-02-10 DIAGNOSIS — Z515 Encounter for palliative care: Secondary | ICD-10-CM

## 2020-02-10 NOTE — Progress Notes (Signed)
[facility] Consult Note Telephone: [phone]  Fax: [phone]  PATIENT NAME: David Morgan Morgan [address] [place] [address] [phone] (home)  DOB: [birth year] MRN: [id number]  PRIMARY CARE PROVIDER:    Juluis Pitch, MD,  [address] [place] [address] [phone]  REFERRING PROVIDER:   Juluis Pitch, MD [address] [place],  [place] [address] [phone]  RESPONSIBLE PARTY:   Extended Emergency Contact Information Primary Emergency Contact: David Morgan Morgan,David Morgan Address: [place], [place] [address] [place] Phone: [phone] Relation: Spouse  I met with patient in the facility.  ASSESSMENT AND RECOMMENDATIONS:   1. Advance Care Planning/Goals of Care: Goals include to maximize quality of life and symptom management. MOST on file at nursing home with DNR, comfort measures, limited use of abx and IV, no feeding tube, signed by David Morgan [facility] David Morgan Morgan.  2. Symptom Management:   Nutrition: Eating well, 100 % most meals, and not losing weight from baseline. Continue. Wife is concerned about weight loss however as she states he is not eating well.   Constipation: Recently added miralax but constipation continues. Please add senna 1 tablet bid to med list.  Hearing deficit: Sensory deficit is significant. He had hearing aides for a short time but took them back and did not like them. He complains about not being able to hear his roommate. Recommend an amplification device with ear phones.  3. Family /Caregiver/Community Supports: Patient now LTC patient, recently moved to new room and is adapting per staff report. Has a prayer shawl with his name, and did not make it to his new room. Wife would like him to have it.   4. Cognitive / Functional decline: A and O x 1 , h/o dementia, able to converse but also limited by David Morgan Morgan. Needs assistance with adls and all iadls.  5. Follow up Palliative  Care Visit: Palliative care will continue to follow for goals of care clarification and symptom management. Return 4 weeks or prn.  I spent 35 minutes providing this consultation,  from 1000 to 1035. More than 50% of the time in this consultation was spent coordinating communication.   HISTORY OF PRESENT ILLNESS:  David Morgan Morgan is a 86 y.o. year old male with multiple medical problems including dementia with behavioral disturbances. Palliative Care was asked to follow this patient by consultation request of David Morgan Pitch, MD to help address advance care planning and goals of care. This is a follow up visit.  CODE STATUS:  MOST on file at nursing home with DNR, comfort measures, limited use of abx and IV, no feeding tube  PPS: 40%  HOSPICE ELIGIBILITY/DIAGNOSIS: TBD  PAST MEDICAL HISTORY:  Past Medical History:  Diagnosis Date  . Arthritis   . Cancer ([place])   . Claudication ([place])   . Diverticulitis   . Emphysema/COPD ([place])   . History of bladder cancer   . History of carotid artery stenosis   . History of TIA (transient ischemic attack)    2012--  NO RESIDUAL (PER SCAN HAD A PREVIOUS TIA BEFORE 2012)  . Hyperlipidemia   . Hypertension   . Hypothyroidism   . Lesion of bladder   . Mild asthma    NO INHALER  . Nocturia   . Peripheral vascular disease ([place])   . Short of breath on exertion   . Urgency of urination   . Wears glasses  SOCIAL HX:  Social History   Tobacco Use  . Smoking status: Former Smoker    Packs/day: 1.00    Years: 40.00    Pack years: 40.00    Types: Cigarettes    Quit date: 12/21/1993    Years since quitting: 26.1  . Smokeless tobacco: Never Used  Substance Use Topics  . Alcohol use: No    Comment: RARE    ALLERGIES:  Allergies  Allergen Reactions  . Adhesive [Tape] Rash  . Augmentin [Amoxicillin-Pot Clavulanate] Itching, Rash and Other (See Comments)    Has patient had a PCN reaction causing immediate rash, facial/tongue/throat  swelling, SOB or lightheadedness with hypotension: No Has patient had a PCN reaction causing severe rash involving mucus membranes or skin necrosis: No Has patient had a PCN reaction that required hospitalization No Has patient had a PCN reaction occurring within the last 10 years: No If all of the above answers are "NO", then may proceed with Cephalosporin use.      PERTINENT MEDICATIONS:  Outpatient Encounter Medications as of 02/10/2020  Medication Sig  . acetaminophen (TYLENOL) 500 MG tablet Take 1,000 mg by mouth every 6 (six) hours as needed.  [name] aspirin EC 81 MG EC tablet Take 1 tablet (81 mg total) by mouth daily.  . carvedilol (COREG) 3.125 MG tablet Take 3.125 mg by mouth 2 (two) times daily with a meal.  . donepezil (ARICEPT) 5 MG tablet Take 5 mg by mouth daily.  . Ipratropium-Albuterol (COMBIVENT RESPIMAT) 20-100 MCG/ACT AERS respimat Inhale 1 puff into the lungs every 6 (six) hours.  [name] levothyroxine (SYNTHROID, LEVOTHROID) 112 MCG tablet Take 112 mcg by mouth daily before breakfast.  . Multiple Vitamin (MULTI-VITAMINS) TABS Take 1 tablet by mouth daily.  . polyethylene glycol (MIRALAX / GLYCOLAX) 17 g packet Take 17 g by mouth daily.  . QUEtiapine (SEROQUEL) 25 MG tablet Take 1 tablet (25 mg total) by mouth at bedtime.  . tamsulosin (FLOMAX) 0.4 MG CAPS capsule Take 0.4 mg by mouth every evening.    No facility-administered encounter medications on file as of 02/10/2020.    PHYSICAL EXAM / ROS:   Current and past weights: 210 lbs. currently, 211 lbs. on admission 2 months ago General: NAD, frail appearing, WNWD Cardiovascular: no chest pain reported, no edema  Pulmonary: no cough, no increased SOB, room air Abdomen: appetite good, endorses constipation, incontinent of bowel GU: denies dysuria, incontinent of urine MSK:  no joint and ROM abnormalities, non ambulatory Skin: no rashes or wounds reported Neurological: Weakness, dementia, confusion at times, [facility], staff deny  behavior disturbances.  David Morgan Coop, NP [facility]  COVID-19 PATIENT SCREENING TOOL  Person answering questions: ____________Staff_______ _____   1.  Is the patient or any family member in the home showing any signs or symptoms regarding respiratory infection?               Person with Symptom- __________NA_________________  a. Fever                                                                          Yes___ No___          ___________________  b. Shortness of breath  Yes___ No___          ___________________ c. Cough/congestion                                       Yes___  No___         ___________________ d. Body aches/pains                                                         Yes___ No___        ____________________ e. Gastrointestinal symptoms (diarrhea, nausea)           Yes___ No___        ____________________  2. Within the past 14 days, has anyone living in the home had any contact with someone with or under investigation for COVID-19?    Yes___ No_X_   Person __________________   

## 2020-02-14 DIAGNOSIS — Z66 Do not resuscitate: Secondary | ICD-10-CM | POA: Diagnosis not present

## 2020-02-14 DIAGNOSIS — F0390 Unspecified dementia without behavioral disturbance: Secondary | ICD-10-CM | POA: Diagnosis not present

## 2020-02-14 DIAGNOSIS — A419 Sepsis, unspecified organism: Secondary | ICD-10-CM | POA: Diagnosis not present

## 2020-02-14 DIAGNOSIS — I10 Essential (primary) hypertension: Secondary | ICD-10-CM | POA: Diagnosis not present

## 2020-02-14 DIAGNOSIS — E119 Type 2 diabetes mellitus without complications: Secondary | ICD-10-CM | POA: Diagnosis not present

## 2020-02-14 DIAGNOSIS — K810 Acute cholecystitis: Secondary | ICD-10-CM | POA: Diagnosis not present

## 2020-02-14 DIAGNOSIS — E039 Hypothyroidism, unspecified: Secondary | ICD-10-CM | POA: Diagnosis not present

## 2020-02-14 DIAGNOSIS — N400 Enlarged prostate without lower urinary tract symptoms: Secondary | ICD-10-CM | POA: Diagnosis not present

## 2020-02-21 DIAGNOSIS — R319 Hematuria, unspecified: Secondary | ICD-10-CM | POA: Diagnosis not present

## 2020-02-21 DIAGNOSIS — E039 Hypothyroidism, unspecified: Secondary | ICD-10-CM | POA: Diagnosis not present

## 2020-02-21 DIAGNOSIS — M6281 Muscle weakness (generalized): Secondary | ICD-10-CM | POA: Diagnosis not present

## 2020-02-21 DIAGNOSIS — E119 Type 2 diabetes mellitus without complications: Secondary | ICD-10-CM | POA: Diagnosis not present

## 2020-02-21 DIAGNOSIS — D649 Anemia, unspecified: Secondary | ICD-10-CM | POA: Diagnosis not present

## 2020-02-21 DIAGNOSIS — N390 Urinary tract infection, site not specified: Secondary | ICD-10-CM | POA: Diagnosis not present

## 2020-02-21 DIAGNOSIS — E785 Hyperlipidemia, unspecified: Secondary | ICD-10-CM | POA: Diagnosis not present

## 2020-02-21 DIAGNOSIS — Z79899 Other long term (current) drug therapy: Secondary | ICD-10-CM | POA: Diagnosis not present

## 2020-02-27 ENCOUNTER — Ambulatory Visit: Payer: PPO | Admitting: Dermatology

## 2020-02-27 DIAGNOSIS — I10 Essential (primary) hypertension: Secondary | ICD-10-CM | POA: Diagnosis not present

## 2020-02-27 DIAGNOSIS — N419 Inflammatory disease of prostate, unspecified: Secondary | ICD-10-CM | POA: Diagnosis not present

## 2020-02-27 DIAGNOSIS — E039 Hypothyroidism, unspecified: Secondary | ICD-10-CM | POA: Diagnosis not present

## 2020-02-27 DIAGNOSIS — G20 Parkinson's disease: Secondary | ICD-10-CM | POA: Diagnosis not present

## 2020-02-27 DIAGNOSIS — F0150 Vascular dementia without behavioral disturbance: Secondary | ICD-10-CM | POA: Diagnosis not present

## 2020-02-27 DIAGNOSIS — E785 Hyperlipidemia, unspecified: Secondary | ICD-10-CM | POA: Diagnosis not present

## 2020-03-14 ENCOUNTER — Other Ambulatory Visit: Payer: Self-pay

## 2020-03-14 ENCOUNTER — Non-Acute Institutional Stay: Payer: PPO | Admitting: Primary Care

## 2020-03-14 DIAGNOSIS — F0390 Unspecified dementia without behavioral disturbance: Secondary | ICD-10-CM

## 2020-03-14 DIAGNOSIS — Z515 Encounter for palliative care: Secondary | ICD-10-CM | POA: Diagnosis not present

## 2020-03-14 NOTE — Progress Notes (Signed)
[facility] Consult Note Telephone: [phone]  Fax: [phone]  PATIENT NAME: David Morgan [address] [place] [address] [phone] (home)  DOB: [birth year] MRN: [id number]  PRIMARY CARE PROVIDER:    Juluis Pitch, MD,  [address] [place] [address] [phone]  REFERRING PROVIDER:   Juluis Pitch, MD [address] [place],  [place] [address] [phone]  RESPONSIBLE PARTY:   Extended Emergency Contact Information Primary Emergency Contact: Seese,[name] Address: [place], [place] [address] [place] Phone: [phone] Relation: Spouse  I met face to face with patient and family in home/facility.  ASSESSMENT AND RECOMMENDATIONS:   1. Advance Care Planning/Goals of Care: Goals include to maximize quality of life and symptom management. MOST on file at nursing home with DNR, comfort measures, limited use of abx and IV, no feeding tube, signed by [employer] Mrs. Scully.    2. Symptom Management:   Pain: states he has had worse pain in the past and now it's manageable. Staff states he has some neck pain but it is usually resolved with positioning. Has PRN tylenol but may benefit from this being scheduled tid instead, since pain is chronic  Rather than episodic.  Depression; Reportedly agitated at times, tearful on this visit. Recommend sertraline 50 mg for mood elevation.  3. Follow up Palliative Care Visit: Palliative care will continue to follow for goals of care clarification and symptom management. Return 4 weeks or prn.  4. Family /Caregiver/Community Supports: wife is poa, lives in [place].  5. Cognitive / Functional decline: A and Ox 1, h/o dementia, able to converse but also limited by [facility]. Needs assistance with adls and all iadls.  I spent 25 minutes providing this consultation,  from 1000 to 1025. More than 50% of the time in this consultation was spent coordinating  communication.   CHIEF COMPLAINT: pain  HISTORY OF PRESENT ILLNESS:  David Morgan is a 86 y.o. year old male with multiple medical problems including Dementia,  Chronic pain. Palliative Care was asked to follow this patient by consultation request of Juluis Pitch, MD to help address advance care planning and goals of care. This is a follow up visit.  CODE STATUS: DNR  PPS: 40%  HOSPICE ELIGIBILITY/DIAGNOSIS: no  PAST MEDICAL HISTORY:  Past Medical History:  Diagnosis Date  . Arthritis   . Cancer ([place])   . Claudication ([place])   . Diverticulitis   . Emphysema/COPD ([place])   . History of bladder cancer   . History of carotid artery stenosis   . History of TIA (transient ischemic attack)    2012--  NO RESIDUAL (PER SCAN HAD A PREVIOUS TIA BEFORE 2012)  . Hyperlipidemia   . Hypertension   . Hypothyroidism   . Lesion of bladder   . Mild asthma    NO INHALER  . Nocturia   . Peripheral vascular disease ([place])   . Short of breath on exertion   . Urgency of urination   . Wears glasses     SOCIAL HX:  Social History   Tobacco Use  . Smoking status: Former Smoker    Packs/day: 1.00    Years: 40.00    Pack years: 40.00    Types: Cigarettes    Quit date: 12/21/1993    Years since quitting: 26.2  . Smokeless tobacco: Never Used  Substance Use Topics  . Alcohol use:  No    Comment: RARE   FAMILY HX:  Family History  Problem Relation Age of Onset  . Hypertension Father   . Alzheimer's disease Mother     ALLERGIES:  Allergies  Allergen Reactions  . Adhesive [Tape] Rash  . Augmentin [Amoxicillin-Pot Clavulanate] Itching, Rash and Other (See Comments)    Has patient had a PCN reaction causing immediate rash, facial/tongue/throat swelling, SOB or lightheadedness with hypotension: No Has patient had a PCN reaction causing severe rash involving mucus membranes or skin necrosis: No Has patient had a PCN reaction that required hospitalization No Has patient had a PCN  reaction occurring within the last 10 years: No If all of the above answers are "NO", then may proceed with Cephalosporin use.      PERTINENT MEDICATIONS:  Outpatient Encounter Medications as of 03/14/2020  Medication Sig  . acetaminophen (TYLENOL) 500 MG tablet Take 1,000 mg by mouth every 6 (six) hours as needed.  [name] aspirin EC 81 MG EC tablet Take 1 tablet (81 mg total) by mouth daily.  . carvedilol (COREG) 3.125 MG tablet Take 3.125 mg by mouth 2 (two) times daily with a meal.  . donepezil (ARICEPT) 5 MG tablet Take 5 mg by mouth daily.  . Ipratropium-Albuterol (COMBIVENT RESPIMAT) 20-100 MCG/ACT AERS respimat Inhale 1 puff into the lungs every 6 (six) hours.  [name] levothyroxine (SYNTHROID, LEVOTHROID) 112 MCG tablet Take 112 mcg by mouth daily before breakfast.  . Multiple Vitamin (MULTI-VITAMINS) TABS Take 1 tablet by mouth daily.  . polyethylene glycol (MIRALAX / GLYCOLAX) 17 g packet Take 17 g by mouth daily.  . QUEtiapine (SEROQUEL) 25 MG tablet Take 1 tablet (25 mg total) by mouth at bedtime.  . tamsulosin (FLOMAX) 0.4 MG CAPS capsule Take 0.4 mg by mouth every evening.    No facility-administered encounter medications on file as of 03/14/2020.    PHYSICAL EXAM / ROS:   Current and past weights: stable per staff General: NAD, frail appearing, wnwd Cardiovascular:s1, s2,  no chest pain reported, no LE edema  Pulmonary: no cough, no increased SOB, room air Abdomen: appetite good, denies constipation, continent of bowel GU: denies dysuria, continent of urine MSK:  ++ joint and ROM abnormalities, non ambulatory Skin: no rashes or wounds reported Neurological: Weakness, dementia with labile emotions, tearful or agitated at times.  Jason Coop, NP , DNP, MPH, [facility]  COVID-19 PATIENT SCREENING TOOL  Person answering questions: ____________Staff_______ _____   1.  Is the patient or any family member in the home showing any signs or symptoms regarding respiratory  infection?               Person with Symptom- __________NA_________________  a. Fever                                                                          Yes___ No___          ___________________  b. Shortness of breath  Yes___ No___          ___________________ c. Cough/congestion                                       Yes___  No___         ___________________ d. Body aches/pains                                                         Yes___ No___        ____________________ e. Gastrointestinal symptoms (diarrhea, nausea)           Yes___ No___        ____________________  2. Within the past 14 days, has anyone living in the home had any contact with someone with or under investigation for COVID-19?    Yes___ No_X_   Person __________________  [name]

## 2020-03-18 DIAGNOSIS — K810 Acute cholecystitis: Secondary | ICD-10-CM | POA: Diagnosis not present

## 2020-03-18 DIAGNOSIS — N400 Enlarged prostate without lower urinary tract symptoms: Secondary | ICD-10-CM | POA: Diagnosis not present

## 2020-03-18 DIAGNOSIS — Z66 Do not resuscitate: Secondary | ICD-10-CM | POA: Diagnosis not present

## 2020-03-18 DIAGNOSIS — E119 Type 2 diabetes mellitus without complications: Secondary | ICD-10-CM | POA: Diagnosis not present

## 2020-03-18 DIAGNOSIS — E039 Hypothyroidism, unspecified: Secondary | ICD-10-CM | POA: Diagnosis not present

## 2020-03-18 DIAGNOSIS — F0390 Unspecified dementia without behavioral disturbance: Secondary | ICD-10-CM | POA: Diagnosis not present

## 2020-03-18 DIAGNOSIS — A419 Sepsis, unspecified organism: Secondary | ICD-10-CM | POA: Diagnosis not present

## 2020-03-18 DIAGNOSIS — I10 Essential (primary) hypertension: Secondary | ICD-10-CM | POA: Diagnosis not present

## 2020-05-11 DIAGNOSIS — E119 Type 2 diabetes mellitus without complications: Secondary | ICD-10-CM | POA: Diagnosis not present

## 2020-05-11 DIAGNOSIS — A419 Sepsis, unspecified organism: Secondary | ICD-10-CM | POA: Diagnosis not present

## 2020-05-11 DIAGNOSIS — E039 Hypothyroidism, unspecified: Secondary | ICD-10-CM | POA: Diagnosis not present

## 2020-05-11 DIAGNOSIS — I10 Essential (primary) hypertension: Secondary | ICD-10-CM | POA: Diagnosis not present

## 2020-05-11 DIAGNOSIS — Z66 Do not resuscitate: Secondary | ICD-10-CM | POA: Diagnosis not present

## 2020-05-11 DIAGNOSIS — N400 Enlarged prostate without lower urinary tract symptoms: Secondary | ICD-10-CM | POA: Diagnosis not present

## 2020-05-11 DIAGNOSIS — F0390 Unspecified dementia without behavioral disturbance: Secondary | ICD-10-CM | POA: Diagnosis not present

## 2020-05-11 DIAGNOSIS — K810 Acute cholecystitis: Secondary | ICD-10-CM | POA: Diagnosis not present

## 2020-05-20 DIAGNOSIS — E785 Hyperlipidemia, unspecified: Secondary | ICD-10-CM | POA: Diagnosis not present

## 2020-05-20 DIAGNOSIS — G20 Parkinson's disease: Secondary | ICD-10-CM | POA: Diagnosis not present

## 2020-05-20 DIAGNOSIS — F0151 Vascular dementia with behavioral disturbance: Secondary | ICD-10-CM | POA: Diagnosis not present

## 2020-05-20 DIAGNOSIS — I10 Essential (primary) hypertension: Secondary | ICD-10-CM | POA: Diagnosis not present

## 2020-05-20 DIAGNOSIS — N419 Inflammatory disease of prostate, unspecified: Secondary | ICD-10-CM | POA: Diagnosis not present

## 2020-05-20 DIAGNOSIS — R1312 Dysphagia, oropharyngeal phase: Secondary | ICD-10-CM | POA: Diagnosis not present

## 2020-05-20 DIAGNOSIS — E039 Hypothyroidism, unspecified: Secondary | ICD-10-CM | POA: Diagnosis not present

## 2020-05-22 DIAGNOSIS — E785 Hyperlipidemia, unspecified: Secondary | ICD-10-CM | POA: Diagnosis not present

## 2020-05-22 DIAGNOSIS — F0150 Vascular dementia without behavioral disturbance: Secondary | ICD-10-CM | POA: Diagnosis not present

## 2020-05-22 DIAGNOSIS — E039 Hypothyroidism, unspecified: Secondary | ICD-10-CM | POA: Diagnosis not present

## 2020-05-22 DIAGNOSIS — N419 Inflammatory disease of prostate, unspecified: Secondary | ICD-10-CM | POA: Diagnosis not present

## 2020-05-22 DIAGNOSIS — G20 Parkinson's disease: Secondary | ICD-10-CM | POA: Diagnosis not present

## 2020-05-22 DIAGNOSIS — Z23 Encounter for immunization: Secondary | ICD-10-CM | POA: Diagnosis not present

## 2020-05-22 DIAGNOSIS — I10 Essential (primary) hypertension: Secondary | ICD-10-CM | POA: Diagnosis not present

## 2020-05-22 DIAGNOSIS — A419 Sepsis, unspecified organism: Secondary | ICD-10-CM | POA: Diagnosis not present

## 2020-05-22 DIAGNOSIS — R6889 Other general symptoms and signs: Secondary | ICD-10-CM | POA: Diagnosis not present

## 2020-06-14 DIAGNOSIS — N390 Urinary tract infection, site not specified: Secondary | ICD-10-CM | POA: Diagnosis not present

## 2020-06-15 DIAGNOSIS — N390 Urinary tract infection, site not specified: Secondary | ICD-10-CM | POA: Diagnosis not present

## 2020-06-23 ENCOUNTER — Encounter: Payer: Self-pay | Admitting: Emergency Medicine

## 2020-06-23 ENCOUNTER — Emergency Department: Payer: PPO

## 2020-06-23 ENCOUNTER — Inpatient Hospital Stay
Admission: EM | Admit: 2020-06-23 | Discharge: 2020-06-29 | DRG: 871 | Disposition: A | Discharge status: Hospice | Payer: PPO | Source: Skilled Nursing Facility | Attending: Internal Medicine | Admitting: Internal Medicine

## 2020-06-23 ENCOUNTER — Other Ambulatory Visit: Payer: Self-pay

## 2020-06-23 DIAGNOSIS — R6521 Severe sepsis with septic shock: Secondary | ICD-10-CM | POA: Diagnosis not present

## 2020-06-23 DIAGNOSIS — I5032 Chronic diastolic (congestive) heart failure: Secondary | ICD-10-CM | POA: Diagnosis present

## 2020-06-23 DIAGNOSIS — I10 Essential (primary) hypertension: Secondary | ICD-10-CM | POA: Diagnosis present

## 2020-06-23 DIAGNOSIS — Z91048 Other nonmedicinal substance allergy status: Secondary | ICD-10-CM | POA: Diagnosis not present

## 2020-06-23 DIAGNOSIS — Z79890 Hormone replacement therapy (postmenopausal): Secondary | ICD-10-CM | POA: Diagnosis not present

## 2020-06-23 DIAGNOSIS — Z7951 Long term (current) use of inhaled steroids: Secondary | ICD-10-CM

## 2020-06-23 DIAGNOSIS — R0602 Shortness of breath: Secondary | ICD-10-CM | POA: Diagnosis not present

## 2020-06-23 DIAGNOSIS — N419 Inflammatory disease of prostate, unspecified: Secondary | ICD-10-CM | POA: Diagnosis not present

## 2020-06-23 DIAGNOSIS — R627 Adult failure to thrive: Secondary | ICD-10-CM | POA: Diagnosis not present

## 2020-06-23 DIAGNOSIS — R6520 Severe sepsis without septic shock: Secondary | ICD-10-CM

## 2020-06-23 DIAGNOSIS — Z8249 Family history of ischemic heart disease and other diseases of the circulatory system: Secondary | ICD-10-CM

## 2020-06-23 DIAGNOSIS — Z9841 Cataract extraction status, right eye: Secondary | ICD-10-CM | POA: Diagnosis not present

## 2020-06-23 DIAGNOSIS — Z961 Presence of intraocular lens: Secondary | ICD-10-CM | POA: Diagnosis present

## 2020-06-23 DIAGNOSIS — R000 Tachycardia, unspecified: Secondary | ICD-10-CM | POA: Diagnosis not present

## 2020-06-23 DIAGNOSIS — J439 Emphysema, unspecified: Secondary | ICD-10-CM | POA: Diagnosis present

## 2020-06-23 DIAGNOSIS — R001 Bradycardia, unspecified: Secondary | ICD-10-CM | POA: Diagnosis present

## 2020-06-23 DIAGNOSIS — J189 Pneumonia, unspecified organism: Secondary | ICD-10-CM

## 2020-06-23 DIAGNOSIS — Z9842 Cataract extraction status, left eye: Secondary | ICD-10-CM | POA: Diagnosis not present

## 2020-06-23 DIAGNOSIS — Z87891 Personal history of nicotine dependence: Secondary | ICD-10-CM

## 2020-06-23 DIAGNOSIS — E785 Hyperlipidemia, unspecified: Secondary | ICD-10-CM | POA: Diagnosis not present

## 2020-06-23 DIAGNOSIS — I5031 Acute diastolic (congestive) heart failure: Secondary | ICD-10-CM | POA: Diagnosis present

## 2020-06-23 DIAGNOSIS — A419 Sepsis, unspecified organism: Secondary | ICD-10-CM | POA: Diagnosis not present

## 2020-06-23 DIAGNOSIS — R069 Unspecified abnormalities of breathing: Secondary | ICD-10-CM | POA: Diagnosis not present

## 2020-06-23 DIAGNOSIS — I110 Hypertensive heart disease with heart failure: Secondary | ICD-10-CM | POA: Diagnosis present

## 2020-06-23 DIAGNOSIS — R0902 Hypoxemia: Secondary | ICD-10-CM | POA: Diagnosis not present

## 2020-06-23 DIAGNOSIS — N400 Enlarged prostate without lower urinary tract symptoms: Secondary | ICD-10-CM | POA: Diagnosis not present

## 2020-06-23 DIAGNOSIS — Z79899 Other long term (current) drug therapy: Secondary | ICD-10-CM | POA: Diagnosis not present

## 2020-06-23 DIAGNOSIS — F0390 Unspecified dementia without behavioral disturbance: Secondary | ICD-10-CM | POA: Diagnosis not present

## 2020-06-23 DIAGNOSIS — Z880 Allergy status to penicillin: Secondary | ICD-10-CM | POA: Diagnosis not present

## 2020-06-23 DIAGNOSIS — E1151 Type 2 diabetes mellitus with diabetic peripheral angiopathy without gangrene: Secondary | ICD-10-CM | POA: Diagnosis present

## 2020-06-23 DIAGNOSIS — D72829 Elevated white blood cell count, unspecified: Secondary | ICD-10-CM | POA: Diagnosis not present

## 2020-06-23 DIAGNOSIS — Z6826 Body mass index (BMI) 26.0-26.9, adult: Secondary | ICD-10-CM

## 2020-06-23 DIAGNOSIS — Z8673 Personal history of transient ischemic attack (TIA), and cerebral infarction without residual deficits: Secondary | ICD-10-CM

## 2020-06-23 DIAGNOSIS — J9621 Acute and chronic respiratory failure with hypoxia: Secondary | ICD-10-CM | POA: Diagnosis not present

## 2020-06-23 DIAGNOSIS — Z7982 Long term (current) use of aspirin: Secondary | ICD-10-CM

## 2020-06-23 DIAGNOSIS — J9601 Acute respiratory failure with hypoxia: Secondary | ICD-10-CM | POA: Diagnosis not present

## 2020-06-23 DIAGNOSIS — Z96651 Presence of right artificial knee joint: Secondary | ICD-10-CM | POA: Diagnosis not present

## 2020-06-23 DIAGNOSIS — J181 Lobar pneumonia, unspecified organism: Secondary | ICD-10-CM | POA: Diagnosis present

## 2020-06-23 DIAGNOSIS — Z66 Do not resuscitate: Secondary | ICD-10-CM | POA: Diagnosis present

## 2020-06-23 DIAGNOSIS — E039 Hypothyroidism, unspecified: Secondary | ICD-10-CM | POA: Diagnosis present

## 2020-06-23 DIAGNOSIS — J441 Chronic obstructive pulmonary disease with (acute) exacerbation: Secondary | ICD-10-CM | POA: Diagnosis not present

## 2020-06-23 DIAGNOSIS — Z20822 Contact with and (suspected) exposure to covid-19: Secondary | ICD-10-CM | POA: Diagnosis not present

## 2020-06-23 DIAGNOSIS — R410 Disorientation, unspecified: Secondary | ICD-10-CM | POA: Diagnosis not present

## 2020-06-23 DIAGNOSIS — G9341 Metabolic encephalopathy: Secondary | ICD-10-CM | POA: Diagnosis not present

## 2020-06-23 DIAGNOSIS — Z820 Family history of epilepsy and other diseases of the nervous system: Secondary | ICD-10-CM

## 2020-06-23 DIAGNOSIS — E1165 Type 2 diabetes mellitus with hyperglycemia: Secondary | ICD-10-CM | POA: Diagnosis present

## 2020-06-23 DIAGNOSIS — F0150 Vascular dementia without behavioral disturbance: Secondary | ICD-10-CM | POA: Diagnosis present

## 2020-06-23 DIAGNOSIS — R404 Transient alteration of awareness: Secondary | ICD-10-CM | POA: Diagnosis not present

## 2020-06-23 DIAGNOSIS — I739 Peripheral vascular disease, unspecified: Secondary | ICD-10-CM | POA: Diagnosis not present

## 2020-06-23 DIAGNOSIS — R059 Cough, unspecified: Secondary | ICD-10-CM | POA: Diagnosis not present

## 2020-06-23 DIAGNOSIS — Z8551 Personal history of malignant neoplasm of bladder: Secondary | ICD-10-CM

## 2020-06-23 DIAGNOSIS — G20C Parkinsonism, unspecified: Secondary | ICD-10-CM | POA: Diagnosis not present

## 2020-06-23 DIAGNOSIS — R0689 Other abnormalities of breathing: Secondary | ICD-10-CM | POA: Diagnosis not present

## 2020-06-23 LAB — CBC WITH DIFFERENTIAL/PLATELET
Abs Immature Granulocytes: 0.1 10*3/uL — ABNORMAL HIGH (ref 0.00–0.07)
Basophils Absolute: 0 10*3/uL (ref 0.0–0.1)
Basophils Relative: 0 %
Eosinophils Absolute: 0.3 10*3/uL (ref 0.0–0.5)
Eosinophils Relative: 2 %
HCT: 51.3 % (ref 39.0–52.0)
Hemoglobin: 15.9 g/dL (ref 13.0–17.0)
Immature Granulocytes: 1 %
Lymphocytes Relative: 9 %
Lymphs Abs: 1.4 10*3/uL (ref 0.7–4.0)
MCH: 27.7 pg (ref 26.0–34.0)
MCHC: 31 g/dL (ref 30.0–36.0)
MCV: 89.5 fL (ref 80.0–100.0)
Monocytes Absolute: 0.8 10*3/uL (ref 0.1–1.0)
Monocytes Relative: 5 %
Neutro Abs: 13.3 10*3/uL — ABNORMAL HIGH (ref 1.7–7.7)
Neutrophils Relative %: 83 %
Platelets: 187 10*3/uL (ref 150–400)
RBC: 5.73 MIL/uL (ref 4.22–5.81)
RDW: 15.6 % — ABNORMAL HIGH (ref 11.5–15.5)
WBC: 15.9 10*3/uL — ABNORMAL HIGH (ref 4.0–10.5)
nRBC: 0 % (ref 0.0–0.2)

## 2020-06-23 LAB — COMPREHENSIVE METABOLIC PANEL
ALT: 13 U/L (ref 0–44)
AST: 21 U/L (ref 15–41)
Albumin: 3.9 g/dL (ref 3.5–5.0)
Alkaline Phosphatase: 94 U/L (ref 38–126)
Anion gap: 13 (ref 5–15)
BUN: 18 mg/dL (ref 8–23)
CO2: 26 mmol/L (ref 22–32)
Calcium: 9.2 mg/dL (ref 8.9–10.3)
Chloride: 101 mmol/L (ref 98–111)
Creatinine, Ser: 0.79 mg/dL (ref 0.61–1.24)
GFR, Estimated: >60 mL/min (ref >60)
Glucose, Bld: 161 mg/dL — ABNORMAL HIGH (ref 70–99)
Potassium: 3.9 mmol/L (ref 3.5–5.1)
Sodium: 140 mmol/L (ref 135–145)
Total Bilirubin: 1.3 mg/dL — ABNORMAL HIGH (ref 0.3–1.2)
Total Protein: 8.5 g/dL — ABNORMAL HIGH (ref 6.5–8.1)

## 2020-06-23 LAB — URINALYSIS, COMPLETE (UACMP) WITH MICROSCOPIC
Bacteria, UA: NONE SEEN
Bilirubin Urine: NEGATIVE
Glucose, UA: NEGATIVE mg/dL
Hgb urine dipstick: NEGATIVE
Ketones, ur: 5 mg/dL — AB
Leukocytes,Ua: NEGATIVE
Nitrite: NEGATIVE
Protein, ur: NEGATIVE mg/dL
Specific Gravity, Urine: 1.02 (ref 1.005–1.030)
Squamous Epithelial / HPF: NONE SEEN (ref 0–5)
pH: 5 (ref 5.0–8.0)

## 2020-06-23 LAB — RESPIRATORY PANEL BY RT PCR (FLU A&B, COVID)
Influenza A by PCR: NEGATIVE
Influenza B by PCR: NEGATIVE
SARS Coronavirus 2 by RT PCR: NEGATIVE

## 2020-06-23 LAB — PROTIME-INR
INR: 1.1 (ref 0.8–1.2)
Prothrombin Time: 13.4 seconds (ref 11.4–15.2)

## 2020-06-23 LAB — CBC
HCT: 52.9 % — ABNORMAL HIGH (ref 39.0–52.0)
Hemoglobin: 16.3 g/dL (ref 13.0–17.0)
MCH: 28 pg (ref 26.0–34.0)
MCHC: 30.8 g/dL (ref 30.0–36.0)
MCV: 90.7 fL (ref 80.0–100.0)
Platelets: 186 10*3/uL (ref 150–400)
RBC: 5.83 MIL/uL — ABNORMAL HIGH (ref 4.22–5.81)
RDW: 15.6 % — ABNORMAL HIGH (ref 11.5–15.5)
WBC: 14.4 10*3/uL — ABNORMAL HIGH (ref 4.0–10.5)
nRBC: 0 % (ref 0.0–0.2)

## 2020-06-23 LAB — CREATININE, SERUM
Creatinine, Ser: 1.06 mg/dL (ref 0.61–1.24)
GFR, Estimated: >60 mL/min (ref >60)

## 2020-06-23 LAB — LACTIC ACID, PLASMA
Lactic Acid, Venous: 2.4 mmol/L (ref 0.5–1.9)
Lactic Acid, Venous: 1.5 mmol/L (ref 0.5–1.9)

## 2020-06-23 MED ORDER — MORPHINE SULFATE (PF) 2 MG/ML IV SOLN: 2.0000 mg | Freq: Once | INTRAVENOUS | Status: AC

## 2020-06-23 MED ORDER — SODIUM CHLORIDE 0.9 % IV SOLN: 2.0000 g | Freq: Once | INTRAVENOUS | Status: DC

## 2020-06-23 MED ORDER — SODIUM CHLORIDE 0.9 % IV SOLN
2.0000 g | Freq: Two times a day (BID) | INTRAVENOUS | Status: DC
  Administered 2020-06-24: 2 g via INTRAVENOUS

## 2020-06-23 MED ORDER — ONDANSETRON HCL 4 MG PO TABS: 4.0000 mg | ORAL_TABLET | Freq: Four times a day (QID) | ORAL | Status: DC | PRN

## 2020-06-23 MED ORDER — VANCOMYCIN HCL IN DEXTROSE 1-5 GM/200ML-% IV SOLN: 1000.0000 mg | Freq: Once | INTRAVENOUS | Status: DC

## 2020-06-23 MED ORDER — ACETAMINOPHEN 325 MG PO TABS: 650.0000 mg | ORAL_TABLET | Freq: Four times a day (QID) | ORAL | Status: DC | PRN

## 2020-06-23 MED ORDER — MORPHINE SULFATE (PF) 2 MG/ML IV SOLN
INTRAVENOUS | Status: AC
  Administered 2020-06-23: 2 mg via INTRAVENOUS
  Filled 2020-06-23: qty 1

## 2020-06-23 MED ORDER — ENOXAPARIN SODIUM 40 MG/0.4ML ~~LOC~~ SOLN
40.0000 mg | SUBCUTANEOUS | Status: DC
  Administered 2020-06-23: 40 mg via SUBCUTANEOUS
  Filled 2020-06-23: qty 0.4

## 2020-06-23 MED ORDER — VANCOMYCIN HCL 750 MG/150ML IV SOLN
750.0000 mg | Freq: Two times a day (BID) | INTRAVENOUS | Status: DC
  Filled 2020-06-23 (×2): qty 150

## 2020-06-23 MED ORDER — LACTATED RINGERS IV SOLN: INTRAVENOUS | Status: DC

## 2020-06-23 MED ORDER — VANCOMYCIN HCL IN DEXTROSE 1-5 GM/200ML-% IV SOLN
1000.0000 mg | Freq: Once | INTRAVENOUS | Status: AC
  Administered 2020-06-23: 1000 mg via INTRAVENOUS
  Filled 2020-06-23: qty 200

## 2020-06-23 MED ORDER — SODIUM CHLORIDE 0.9 % IV BOLUS
500.0000 mL | Freq: Once | INTRAVENOUS | Status: AC
  Administered 2020-06-23: 500 mL via INTRAVENOUS

## 2020-06-23 MED ORDER — ONDANSETRON HCL 4 MG/2ML IJ SOLN
4.0000 mg | Freq: Four times a day (QID) | INTRAMUSCULAR | Status: DC | PRN
  Administered 2020-06-23: 4 mg via INTRAVENOUS
  Filled 2020-06-23: qty 2

## 2020-06-23 MED ORDER — SODIUM CHLORIDE 0.9 % IV SOLN
2.0000 g | Freq: Once | INTRAVENOUS | Status: AC
  Administered 2020-06-23: 2 g via INTRAVENOUS
  Filled 2020-06-23: qty 2

## 2020-06-23 MED ORDER — ACETAMINOPHEN 650 MG RE SUPP
650.0000 mg | Freq: Four times a day (QID) | RECTAL | Status: DC | PRN
  Administered 2020-06-23: 650 mg via RECTAL
  Filled 2020-06-23 (×2): qty 1

## 2020-06-23 NOTE — Progress Notes (Signed)
CODE SEPSIS - PHARMACY COMMUNICATION  **Broad Spectrum Antibiotics should be administered within 1 hour of Sepsis diagnosis**  Time Code Sepsis Called/Page Received: 19:11  Antibiotics Ordered: Cefepime and Vancomycin  Time of 1st antibiotic administration: Cefepime given at 19:38  Additional action taken by pharmacy: n/a  If necessary, Name of Provider/Nurse Contacted: na/    Paulina Fusi, PharmD, BCPS 06/23/2020 7:53 PM

## 2020-06-23 NOTE — ED Notes (Addendum)
Patient noted to gag and vomit small amount of green emesis. Pt suctioned, will give PRN nausea medication.

## 2020-06-23 NOTE — ED Notes (Signed)
Bradler MD advising need for intubation, advising paperwork from facility states need DNR but no DNR paperwork was sent. MD Cheri Fowler advising will call wife to confirm status. NRB placed on patient.

## 2020-06-23 NOTE — ED Provider Notes (Addendum)
[facility] Emergency Department Provider Note   ____________________________________________   First MD Initiated Contact with Patient 06/23/20 1739     (approximate)  I have reviewed the triage vital signs and the nursing notes.   HISTORY  Chief Complaint Shortness of Breath    HPI David Morgan is a 86 y.o. male brought in by EMS with a past medical history relayed by EMS of of vascular dementia causing patient to speak very little and be extremely confused at baseline.  Patient is only oriented to self and therefore history is obtained from EMS as they were reported from the hospital staff.  States that patient was found gasping for breath and with rhonchi appreciated without auscultation at his facility.  EMS report room air saturation to be in the low 80s upon their arrival.  Patient was placed on 4 L by nasal cannula with improvement to 96%.         Past Medical History:  Diagnosis Date  . Arthritis   . Cancer ([place])   . Claudication ([place])   . Diverticulitis   . Emphysema/COPD ([place])   . History of bladder cancer   . History of carotid artery stenosis   . History of TIA (transient ischemic attack)    2012--  NO RESIDUAL (PER SCAN HAD A PREVIOUS TIA BEFORE 2012)  . Hyperlipidemia   . Hypertension   . Hypothyroidism   . Lesion of bladder   . Mild asthma    NO INHALER  . Nocturia   . Peripheral vascular disease ([place])   . Short of breath on exertion   . Urgency of urination   . Wears glasses     Patient Active Problem List   Diagnosis Date Noted  . Chronic diastolic CHF (congestive heart failure) ([place])   . Sepsis ([place])   . Goals of care, counseling/discussion   . Palliative care by specialist   . Acute pancreatitis   . Bradycardia   . Lactic acidosis   . Sepsis associated hypotension ([place]) 11/28/2019  . Acute diastolic CHF (congestive heart failure) ([place]) 11/21/2019  . Pulmonary vascular congestion 11/20/2019  .  Choledocholithiasis   . Chronic dementia without behavioral disturbance ([place]) 11/19/2019  . Accident due to mechanical fall without injury 11/19/2019  . Hyperglycemia due to type 2 diabetes mellitus ([place]) 11/19/2019  . Leukocytosis 11/19/2019  . Acute kidney injury superimposed on CKD ([place]) 11/19/2019  . Troponin I above reference range 11/19/2019  . COPD with acute exacerbation ([place]) 11/19/2019  . Severe sepsis ([place]) 11/19/2019  . Elevated liver enzymes 11/18/2019  . Cholelithiasis 11/18/2019  . Pressure injury of skin 12/22/2017  . Back pain 12/21/2017  . History of diverticulitis 05/02/2016  . Difficulty walking 02/04/2016  . Tremor 02/04/2016  . Weakness 02/04/2016  . Disease of thyroid gland 07/10/2015  . BP (high blood pressure) 07/10/2015  . HLD (hyperlipidemia) 07/10/2015  . BPH (benign prostatic hyperplasia) 08/02/2014    Past Surgical History:  Procedure Laterality Date  . CAROTID ENDARTERECTOMY Right 2005  . CARPAL TUNNEL RELEASE Bilateral 2002  &  2007  . CATARACT EXTRACTION W/ INTRAOCULAR LENS  IMPLANT, BILATERAL    . CYSTOSCOPY W/ RETROGRADES Bilateral 12/26/2013   Procedure: BILATERAL RETROGRADE PYELOGRAM;  Surgeon: Claybon Jabs, MD;  [facility];  Service: Urology;  Laterality: Bilateral;  . CYSTOSCOPY WITH BIOPSY N/A 12/26/2013   Procedure: CYSTOSCOPY WITH BLADDER BIOPSY;  Surgeon: Claybon Jabs, MD;  Location:  SURGERY  CENTER;  Service: Urology;  Laterality: N/A;  . ERCP N/A 11/19/2019   Procedure: ENDOSCOPIC RETROGRADE CHOLANGIOPANCREATOGRAPHY (ERCP);  Surgeon: Gatha Mayer, MD;  Location: [facility] ENDOSCOPY;  Service: Endoscopy;  Laterality: N/A;  . INGUINAL HERNIA REPAIR  YRS AGO  . LAPAROSCOPIC LYSIS OF ADHESIONS  06/28/2015   Procedure: LAPAROSCOPIC LYSIS OF ADHESIONS;  Surgeon: Clayburn Pert, MD;  Location: [facility] ORS;  Service: General;;  . LAPAROSCOPY  06/28/2015   Procedure: LAPAROSCOPY DIAGNOSTIC;  Surgeon: Clayburn Pert, MD;  Location: [facility] ORS;  Service: General;;  . LAPAROTOMY N/A 09/03/2015   Procedure: umbilical hernia repair with mesh;  Surgeon: Hubbard Robinson, MD;  Location: [facility] ORS;  Service: General;  Laterality: N/A;  . ORIF HIP FRACTURE Left 2008   RETAINED HARDWARE  . REMOVAL OF STONES  11/19/2019   Procedure: REMOVAL OF STONES;  Surgeon: Gatha Mayer, MD;  Location: [facility] ENDOSCOPY;  Service: Endoscopy;;  . RIGHT SHOULDER  SURGERY  2005  . SPHINCTEROTOMY  11/19/2019   Procedure: SPHINCTEROTOMY;  Surgeon: Gatha Mayer, MD;  Location: [facility] ENDOSCOPY;  Service: Endoscopy;;  . TOTAL KNEE ARTHROPLASTY Right 2004  . TRANSURETHRAL RESECTION OF BLADDER TUMOR  1990  . UMBILICAL HERNIA REPAIR   2009  &  2011    Prior to Admission medications   Medication Sig Start Date End Date Taking? Authorizing Provider  acetaminophen (TYLENOL) 325 MG tablet Take 650 mg by mouth every 8 (eight) hours as needed.    Yes [provider]  aspirin EC 81 MG EC tablet Take 1 tablet (81 mg total) by mouth daily. 12/02/19  Yes Wieting, Richard, MD  carvedilol (COREG) 3.125 MG tablet Take 3.125 mg by mouth 2 (two) times daily with a meal.   Yes [provider]  donepezil (ARICEPT) 5 MG tablet Take 5 mg by mouth daily.   Yes [provider]  Ipratropium-Albuterol (COMBIVENT RESPIMAT) 20-100 MCG/ACT AERS respimat Inhale 1 puff into the lungs as needed.    Yes [provider]  levothyroxine (SYNTHROID, LEVOTHROID) 112 MCG tablet Take 112 mcg by mouth daily before breakfast.   Yes [provider]  Multiple Vitamin (MULTI-VITAMINS) TABS Take 1 tablet by mouth daily.   Yes [provider]  polyethylene glycol (MIRALAX / GLYCOLAX) 17 g packet Take 17 g by mouth daily as needed.    Yes [provider]  QUEtiapine (SEROQUEL) 25 MG tablet Take 1 tablet (25 mg total) by mouth at bedtime. 12/02/19  Yes Wieting, Richard, MD  tamsulosin (FLOMAX) 0.4 MG CAPS capsule Take  0.4 mg by mouth every evening.    Yes [provider]    Allergies Adhesive [tape] and Augmentin [amoxicillin-pot clavulanate]  Family History  Problem Relation Age of Onset  . Hypertension Father   . Alzheimer's disease Mother     Social History Social History   Tobacco Use  . Smoking status: Former Smoker    Packs/day: 1.00    Years: 40.00    Pack years: 40.00    Types: Cigarettes    Quit date: 12/21/1993    Years since quitting: 26.5  . Smokeless tobacco: Never Used  Vaping Use  . Vaping Use: Never used  Substance Use Topics  . Alcohol use: No    Comment: RARE  . Drug use: No    Review of Systems Unable to assess  ____________________________________________   PHYSICAL EXAM:  VITAL SIGNS: ED Triage Vitals  Enc Vitals Group     BP 06/23/20 1717 [name])  142/70     Pulse Rate 06/23/20 1717 (!) 126     Resp 06/23/20 1717 (!) 26     Temp 06/23/20 1717 98.8 F (37.1 C)     Temp Source 06/23/20 1717 Oral     SpO2 06/23/20 1717 96 %     Weight 06/23/20 1720 180 lb (81.6 kg)     Height 06/23/20 1720 5\' 9"  (1.753 m)     Head Circumference --      Peak Flow --      Pain Score --      Pain Loc --      Pain Edu? --      Excl. in [place]? --    Constitutional: Alert.  Cachectic and in no acute distress. Eyes: Conjunctivae are normal. PERRL. Head: Atraumatic. Nose: No congestion/rhinnorhea. Mouth/Throat: Mucous membranes are moist. Neck: No stridor Cardiovascular: Grossly normal heart sounds.  Good peripheral circulation. Respiratory: Tachypneic.  Rhonchi over bilateral lung fields.  4 L nasal cannula in place Gastrointestinal: Soft and nontender. No distention. Musculoskeletal: No obvious deformities Neurologic:  Normal speech and language. No gross focal neurologic deficits are appreciated. Skin:  Skin is warm and dry. No rash noted. Psychiatric: Mood and affect are normal. Speech and behavior are normal.  ____________________________________________     LABS (all labs ordered are listed, but only abnormal results are displayed)  Labs Reviewed  COMPREHENSIVE METABOLIC PANEL - Abnormal; Notable for the following components:      Result Value   Glucose, Bld 161 (*)    Total Protein 8.5 (*)    Total Bilirubin 1.3 (*)    All other components within normal limits  LACTIC ACID, PLASMA - Abnormal; Notable for the following components:   Lactic Acid, Venous 2.4 (*)    All other components within normal limits  CBC WITH DIFFERENTIAL/PLATELET - Abnormal; Notable for the following components:   WBC 15.9 (*)    RDW 15.6 (*)    Neutro Abs 13.3 (*)    Abs Immature Granulocytes 0.10 (*)    All other components within normal limits  URINALYSIS, COMPLETE (UACMP) WITH MICROSCOPIC - Abnormal; Notable for the following components:   Color, Urine YELLOW (*)    APPearance HAZY (*)    Ketones, ur 5 (*)    All other components within normal limits  CBC - Abnormal; Notable for the following components:   WBC 14.4 (*)    RBC 5.83 (*)    HCT 52.9 (*)    RDW 15.6 (*)    All other components within normal limits  RESPIRATORY PANEL BY RT PCR (FLU A&B, COVID)  CULTURE, BLOOD (ROUTINE X 2)  CULTURE, BLOOD (ROUTINE X 2)  URINE CULTURE  MRSA PCR SCREENING  LACTIC ACID, PLASMA  PROTIME-INR  CREATININE, SERUM  PROTIME-INR  CORTISOL-AM, BLOOD  PROCALCITONIN  CBC  COMPREHENSIVE METABOLIC PANEL   ____________________________________________  EKG  ED ECG REPORT I, Naaman Plummer, the attending physician, personally viewed and interpreted this ECG.  Date: 06/23/2020 EKG Time: 1716 Rate: 123 Rhythm: normal sinus rhythm QRS Axis: normal Intervals: normal ST/T Wave abnormalities: normal Narrative Interpretation: no evidence of acute ischemia  ____________________________________________  RADIOLOGY  ED MD interpretation: One-view portable x-ray of the chest shows patchy airspace opacities in the right lung mid and left lung base suspicious for  multifocal pneumonia  Official radiology report(s): DG Chest Portable 1 View  Result Date: 06/23/2020 CLINICAL DATA:  Cough and shortness of breath. EXAM: PORTABLE CHEST 1 VIEW COMPARISON:  11/30/2019 FINDINGS: Patient is rotated. Asymmetric patchy airspace disease involving the right lung and to a lesser extent left lung base. Heart size normal. No significant pleural fluid or pneumothorax. Patient's chin obscures the right lung apex. IMPRESSION: Asymmetric patchy airspace disease involving the right lung and to a lesser extent left lung base, suspicious for multifocal pneumonia. Rotated exam which limits assessment. Electronically Signed   By: Keith Rake M.D.   On: 06/23/2020 18:44    ____________________________________________   PROCEDURES  Procedure(s) performed (including Critical Care):  .Critical Care Performed by: Naaman Plummer, MD Authorized by: Naaman Plummer, MD   Critical care provider statement:    Critical care time (minutes):  51   Critical care time was exclusive of:  Separately billable procedures and treating other patients   Critical care was necessary to treat or prevent imminent or life-threatening deterioration of the following conditions:  Respiratory failure   Critical care was time spent personally by me on the following activities:  Discussions with consultants, evaluation of patient's response to treatment, examination of patient, ordering and performing treatments and interventions, ordering and review of laboratory studies, ordering and review of radiographic studies, pulse oximetry, re-evaluation of patient's condition, obtaining history from patient or surrogate and review of old charts   I assumed direction of critical care for this patient from another provider in my specialty: no   .1-3 Lead EKG Interpretation Performed by: Naaman Plummer, MD Authorized by: Naaman Plummer, MD     Interpretation: abnormal     ECG rate:  113   ECG rate  assessment: tachycardic     Rhythm: sinus tachycardia     Ectopy: none     Conduction: normal       ____________________________________________   INITIAL IMPRESSION / ASSESSMENT AND PLAN / ED COURSE  As part of my medical decision making, I reviewed the following data within the [place] notes reviewed and incorporated, Labs reviewed, EKG interpreted, Old chart reviewed, Radiograph reviewed and Notes from prior ED visits reviewed and incorporated        Presents with shortness of breath, cough, and malaise concerning for pneumonia.  DDx: PE, COPD exacerbation, Pneumothorax, TB, Atypical ACS, Esophageal Rupture, Toxic Exposure, Foreign Body Airway Obstruction.  Workup: CXR CBC, CMP, lactate, troponin  Given History, Exam, and Workup presentation most consistent with pneumonia.  Findings: Chest x-ray showing multifocal pneumonia Leukocytosis to 15 Lactic acid 2.4  Tx: Cefepime and Vanco  1933 Reassessment: As patient is continuing to require supplemental oxygenation for acute hypoxic respiratory failure, patient will require admission to the internal medicine service for further evaluation and management.  Patient did have an acute change in his mental status with a new GCS 3.  I spoke to patient's wife at length about patient's advanced directives and she stated that he would not want to be intubated or have any aerobic measures with resuscitation.  Patient's wife also states that he has advanced directives in our computer system that would mimic these requests.  Patient's wife also requested that patient not be comfort care until she has a chance to see and say goodbye to him however she is unable to drive at night and will be working on potentially getting a family member to drive her in this evening.  Disposition: Admit      ____________________________________________   FINAL CLINICAL IMPRESSION(S) / ED DIAGNOSES  Final diagnoses:    Multifocal pneumonia  Acute respiratory failure with hypoxia ([place])  Sepsis with acute hypoxic respiratory failure without septic shock, due to unspecified organism [facility])     ED Discharge Orders    None       Note:  This document was prepared using Dragon voice recognition software and may include unintentional dictation errors.   Naaman Plummer, MD 06/23/20 [place]    Naaman Plummer, MD 06/23/20 [phone]

## 2020-06-23 NOTE — H&P (Signed)
History and Physical   [name]:[id number] DOB: 03-22-1925 DOA: 06/23/2020  Referring MD/NP/PA: Dr. Cheri Fowler  PCP: Juluis Pitch, MD   Outpatient Specialists: None  Patient coming from: Home  Chief Complaint: Altered mental status and shortness of breath  HPI: David Morgan is a 86 y.o. male with medical history significant of bladder cancer, history of CVA, hypertension, hypothyroidism, hyperlipidemia, peripheral vascular disease, COPD and dementia who has basal confusion and lives with his wife.  He was brought in by EMS due to worsening shortness of breath altered mental status.  He came in in significant respiratory distress literally gasping for breath.  Was seen and evaluated.  Work-up showed bilateral pneumonia COVID-19 negative.  Patient meets sepsis criteria with evidence of multifocal pneumonia.  He was originally admitted on 4 L of oxygen.  Within minutes patient sats kept dropping.  He is currently on nonrebreather back.  His condition has worsened.  He has been DNR on arrival.  Patient's wife is currently trying to come into the hospital but has given consent for no aggressive care and possible comfort measures.  He is deteriorating while in the ER at the moment.  He is going into septic shock with systolic dropping into the 60s.  Patient is already admitted to the medical service for treatment but may be transitioning soon to comfort care..  ED Course: Temperature is 102.7 with blood pressure initially 174/86 dropping to 69/53 pulse 147 respirate 45 oxygen sat 86% on 4 L.  White count 14.4 hemoglobin 16.3 and platelets 186 chemistry largely within normal except for glucose 161.  Initial lactic acid 2.4.  COVID-19 screen is negative urinalysis so far negative.  Chest x-ray showed asymmetric patchy airspace disease mainly the right lung and to a lesser extent left lung base suspicious for multifocal pneumonia.  Patient admitted to the hospital for treatment.  Review  of Systems: As per HPI otherwise 10 point review of systems negative.    Past Medical History:  Diagnosis Date  . Arthritis   . Cancer ([place])   . Claudication ([place])   . Diverticulitis   . Emphysema/COPD ([place])   . History of bladder cancer   . History of carotid artery stenosis   . History of TIA (transient ischemic attack)    2012--  NO RESIDUAL (PER SCAN HAD A PREVIOUS TIA BEFORE 2012)  . Hyperlipidemia   . Hypertension   . Hypothyroidism   . Lesion of bladder   . Mild asthma    NO INHALER  . Nocturia   . Peripheral vascular disease ([place])   . Short of breath on exertion   . Urgency of urination   . Wears glasses     Past Surgical History:  Procedure Laterality Date  . CAROTID ENDARTERECTOMY Right 2005  . CARPAL TUNNEL RELEASE Bilateral 2002  &  2007  . CATARACT EXTRACTION W/ INTRAOCULAR LENS  IMPLANT, BILATERAL    . CYSTOSCOPY W/ RETROGRADES Bilateral 12/26/2013   Procedure: BILATERAL RETROGRADE PYELOGRAM;  Surgeon: Claybon Jabs, MD;  Location[facility];  Service: Urology;  Laterality: Bilateral;  . CYSTOSCOPY WITH BIOPSY N/A 12/26/2013   Procedure: CYSTOSCOPY WITH BLADDER BIOPSY;  Surgeon: Claybon Jabs, MD;  Location: [facility];  Service: Urology;  Laterality: N/A;  . ERCP N/A 11/19/2019   Procedure: ENDOSCOPIC RETROGRADE CHOLANGIOPANCREATOGRAPHY (ERCP);  Surgeon: Gatha Mayer, MD;  Location: [facility] ENDOSCOPY;  Service: Endoscopy;  Laterality: N/A;  . INGUINAL HERNIA REPAIR  YRS AGO  .  LAPAROSCOPIC LYSIS OF ADHESIONS  06/28/2015   Procedure: LAPAROSCOPIC LYSIS OF ADHESIONS;  Surgeon: Clayburn Pert, MD;  Location: [facility] ORS;  Service: General;;  . LAPAROSCOPY  06/28/2015   Procedure: LAPAROSCOPY DIAGNOSTIC;  Surgeon: Clayburn Pert, MD;  Location: [facility] ORS;  Service: General;;  . LAPAROTOMY N/A 09/03/2015   Procedure: umbilical hernia repair with mesh;  Surgeon: Hubbard Robinson, MD;  Location: [facility] ORS;  Service: General;  Laterality:  N/A;  . ORIF HIP FRACTURE Left 2008   RETAINED HARDWARE  . REMOVAL OF STONES  11/19/2019   Procedure: REMOVAL OF STONES;  Surgeon: Gatha Mayer, MD;  Location: [facility] ENDOSCOPY;  Service: Endoscopy;;  . RIGHT SHOULDER  SURGERY  2005  . SPHINCTEROTOMY  11/19/2019   Procedure: SPHINCTEROTOMY;  Surgeon: Gatha Mayer, MD;  Location: [facility] ENDOSCOPY;  Service: Endoscopy;;  . TOTAL KNEE ARTHROPLASTY Right 2004  . TRANSURETHRAL RESECTION OF BLADDER TUMOR  1990  . UMBILICAL HERNIA REPAIR   2009  &  2011     reports that he quit smoking about 26 years ago. His smoking use included cigarettes. He has a 40.00 pack-year smoking history. He has never used smokeless tobacco. He reports that he does not drink alcohol and does not use drugs.  Allergies  Allergen Reactions  . Adhesive [Tape] Rash  . Augmentin [Amoxicillin-Pot Clavulanate] Itching, Rash and Other (See Comments)    Has patient had a PCN reaction causing immediate rash, facial/tongue/throat swelling, SOB or lightheadedness with hypotension: No Has patient had a PCN reaction causing severe rash involving mucus membranes or skin necrosis: No Has patient had a PCN reaction that required hospitalization No Has patient had a PCN reaction occurring within the last 10 years: No If all of the above answers are "NO", then may proceed with Cephalosporin use.     Family History  Problem Relation Age of Onset  . Hypertension Father   . Alzheimer's disease Mother      Prior to Admission medications   Medication Sig Start Date End Date Taking? Authorizing Provider  acetaminophen (TYLENOL) 500 MG tablet Take 1,000 mg by mouth every 6 (six) hours as needed.    [provider]  aspirin EC 81 MG EC tablet Take 1 tablet (81 mg total) by mouth daily. 12/02/19   Loletha Grayer, MD  carvedilol (COREG) 3.125 MG tablet Take 3.125 mg by mouth 2 (two) times daily with a meal.    [provider]  donepezil (ARICEPT) 5 MG tablet Take 5 mg by  mouth daily.    [provider]  Ipratropium-Albuterol (COMBIVENT RESPIMAT) 20-100 MCG/ACT AERS respimat Inhale 1 puff into the lungs every 6 (six) hours.    [provider]  levothyroxine (SYNTHROID, LEVOTHROID) 112 MCG tablet Take 112 mcg by mouth daily before breakfast.    [provider]  Multiple Vitamin (MULTI-VITAMINS) TABS Take 1 tablet by mouth daily.    [provider]  polyethylene glycol (MIRALAX / GLYCOLAX) 17 g packet Take 17 g by mouth daily.    [provider]  QUEtiapine (SEROQUEL) 25 MG tablet Take 1 tablet (25 mg total) by mouth at bedtime. 12/02/19   Loletha Grayer, MD  tamsulosin (FLOMAX) 0.4 MG CAPS capsule Take 0.4 mg by mouth every evening.     [provider]    Physical Exam: Vitals:   06/23/20 2315 06/23/20 2330 06/23/20 2345 06/23/20 2351  BP:  (!) 79/55    Pulse: (!) 120 (!) 115 98   Resp: [name])  30 (!) 27 (!) 26   Temp:    100 F (37.8 C)  TempSrc:    Axillary  SpO2: 91% 96% 97%   Weight:      Height:          Constitutional: Obtunded, in respiratory distress Vitals:   06/23/20 2315 06/23/20 2330 06/23/20 2345 06/23/20 2351  BP:  (!) 79/55    Pulse: (!) 120 (!) 115 98   Resp: (!) 30 (!) 27 (!) 26   Temp:    100 F (37.8 C)  TempSrc:    Axillary  SpO2: 91% 96% 97%   Weight:      Height:       Eyes: PERRL, lids and conjunctivae normal ENMT: Mucous membranes are dry. Posterior pharynx clear of any exudate or lesions.edentulous Neck: normal, supple, no masses, no thyromegaly Respiratory: Decreased air entry bilaterally, coarse breath sounds, diffuse rhonchi and crackles Cardiovascular: Sinus tachycardia no murmurs / rubs / gallops. No extremity edema. 2+ pedal pulses. No carotid bruits.  Abdomen: no tenderness, no masses palpated. No hepatosplenomegaly. Bowel sounds positive.  Musculoskeletal: no clubbing / cyanosis. No joint deformity upper and lower extremities. Good ROM, no contractures.  Normal muscle tone.  Skin: no rashes, lesions, ulcers. No induration Neurologic: CN 2-12 grossly intact. Sensation intact, DTR normal. Strength 5/5 in all 4.  Psychiatric: Completely obtunded, not responsive to voice or touch.     Labs on Admission: I have personally reviewed following labs and imaging studies  CBC: Recent Labs  Lab 06/23/20 1731 06/23/20 2057  WBC 15.9* 14.4*  NEUTROABS 13.3*  --   HGB 15.9 16.3  HCT 51.3 52.9*  MCV 89.5 90.7  PLT 187 106   Basic Metabolic Panel: Recent Labs  Lab 06/23/20 1731 06/23/20 2057  NA 140  --   K 3.9  --   CL 101  --   CO2 26  --   GLUCOSE 161*  --   BUN 18  --   CREATININE 0.79 1.06  CALCIUM 9.2  --    GFR: Estimated Creatinine Clearance: 43.5 mL/min (by C-G formula based on SCr of 1.06 mg/dL). Liver Function Tests: Recent Labs  Lab 06/23/20 1731  AST 21  ALT 13  ALKPHOS 94  BILITOT 1.3*  PROT 8.5*  ALBUMIN 3.9   No results for input(s): LIPASE, AMYLASE in the last 168 hours. No results for input(s): AMMONIA in the last 168 hours. Coagulation Profile: Recent Labs  Lab 06/23/20 1731  INR 1.1   Cardiac Enzymes: No results for input(s): CKTOTAL, CKMB, CKMBINDEX, TROPONINI in the last 168 hours. BNP (last 3 results) No results for input(s): PROBNP in the last 8760 hours. HbA1C: No results for input(s): HGBA1C in the last 72 hours. CBG: No results for input(s): GLUCAP in the last 168 hours. Lipid Profile: No results for input(s): CHOL, HDL, LDLCALC, TRIG, CHOLHDL, LDLDIRECT in the last 72 hours. Thyroid Function Tests: No results for input(s): TSH, T4TOTAL, FREET4, T3FREE, THYROIDAB in the last 72 hours. Anemia Panel: No results for input(s): VITAMINB12, FOLATE, FERRITIN, TIBC, IRON, RETICCTPCT in the last 72 hours. Urine analysis:    Component Value Date/Time   COLORURINE YELLOW (A) 06/23/2020 1851   APPEARANCEUR HAZY (A) 06/23/2020 1851   LABSPEC 1.020 06/23/2020 1851   PHURINE 5.0 06/23/2020 [place] 06/23/2020 1851   HGBUR NEGATIVE 06/23/2020 1851   BILIRUBINUR NEGATIVE 06/23/2020 1851   KETONESUR 5 (A) 06/23/2020 [place] NEGATIVE 06/23/2020 1851  NITRITE NEGATIVE 06/23/2020 1851   LEUKOCYTESUR NEGATIVE 06/23/2020 1851   Sepsis Labs: @LABRCNTIP (procalcitonin:4,lacticidven:4) ) Recent Results (from the past 240 hour(s))  Respiratory Panel by RT PCR (Flu A&B, Covid) - Nasopharyngeal Swab     Status: None   Collection Time: 06/23/20  7:54 PM   Specimen: Nasopharyngeal Swab  Result Value Ref Range Status   SARS Coronavirus 2 by RT PCR NEGATIVE NEGATIVE Final    Comment: (NOTE) SARS-CoV-2 target nucleic acids are NOT DETECTED.  The SARS-CoV-2 RNA is generally detectable in upper respiratoy specimens during the acute phase of infection. The lowest concentration of SARS-CoV-2 viral copies this assay can detect is 131 copies/mL. A negative result does not preclude SARS-Cov-2 infection and should not be used as the sole basis for treatment or other patient management decisions. A negative result may occur with  improper specimen collection/handling, submission of specimen other than nasopharyngeal swab, presence of viral mutation(s) within the areas targeted by this assay, and inadequate number of viral copies (<131 copies/mL). A negative result must be combined with clinical observations, patient history, and epidemiological information. The expected result is Negative.  Fact Sheet for Patients:  PinkCheek.be  Fact Sheet for Healthcare Providers:  GravelBags.it  This test is no t yet approved or cleared by the [place] FDA and  has been authorized for detection and/or diagnosis of SARS-CoV-2 by FDA under an Emergency Use Authorization (EUA). This EUA will remain  in effect (meaning this test can be used) for the duration of the COVID-19 declaration under Section 564(b)(1) of the Act,  21 U.S.C. section 360bbb-3(b)(1), unless the authorization is terminated or revoked sooner.     Influenza A by PCR NEGATIVE NEGATIVE Final   Influenza B by PCR NEGATIVE NEGATIVE Final    Comment: (NOTE) The Xpert Xpress SARS-CoV-2/FLU/RSV assay is intended as an aid in  the diagnosis of influenza from Nasopharyngeal swab specimens and  should not be used as a sole basis for treatment. Nasal washings and  aspirates are unacceptable for Xpert Xpress SARS-CoV-2/FLU/RSV  testing.  Fact Sheet for Patients: PinkCheek.be  Fact Sheet for Healthcare Providers: GravelBags.it  This test is not yet approved or cleared by the [place] FDA and  has been authorized for detection and/or diagnosis of SARS-CoV-2 by  FDA under an Emergency Use Authorization (EUA). This EUA will remain  in effect (meaning this test can be used) for the duration of the  Covid-19 declaration under Section 564(b)(1) of the Act, 21  U.S.C. section 360bbb-3(b)(1), unless the authorization is  terminated or revoked. Performed at [facility], [address], [name], [place] [address]      Radiological Exams on Admission: DG Chest Portable 1 View  Result Date: 06/23/2020 CLINICAL DATA:  Cough and shortness of breath. EXAM: PORTABLE CHEST 1 VIEW COMPARISON:  11/30/2019 FINDINGS: Patient is rotated. Asymmetric patchy airspace disease involving the right lung and to a lesser extent left lung base. Heart size normal. No significant pleural fluid or pneumothorax. Patient's chin obscures the right lung apex. IMPRESSION: Asymmetric patchy airspace disease involving the right lung and to a lesser extent left lung base, suspicious for multifocal pneumonia. Rotated exam which limits assessment. Electronically Signed   By: Keith Rake M.D.   On: 06/23/2020 18:44    Pneumonia  Assessment/Plan Principal Problem:   Severe sepsis (HCC) Active Problems:    BP (high blood pressure)   HLD (hyperlipidemia)   BPH (benign prostatic hyperplasia)   Chronic dementia without behavioral disturbance ([place])  Hyperglycemia due to type 2 diabetes mellitus ([place])   COPD with acute exacerbation (HCC)   Acute diastolic CHF (congestive heart failure) ([place])   Lobar pneumonia (HCC)     #1 severe sepsis: Patient has evidence of severe sepsis.  He has sepsis with endorgan damage including septic shock.  He is a DNR.  At this point we will continue with supportive care only.  Comfort measures are being approved by wife who would love to come see the patient but has no means of coming in.  We will continue with current care.  #2 multifocal pneumonia: As per above.  Patient initiated on cefepime and Vanco and will continue treatment.  #3 end-stage dementia: Supportive care only with comfort  #4 essential hypertension: Currently in septic shock.  No antihypertensives  #5 diabetes: Comfort measures at this point.  Will avoid insulins  #6 hyperlipidemia: Hold medications for now.  #7 metabolic encephalopathy: Worsening mental status on top of advanced dementia.  No active treatment.   DVT prophylaxis: Lovenox Code Status: DNR Family Communication: Wife over the phone Disposition Plan: Comfort mainly Consults called: None Admission status: Inpatient  Severity of Illness: The appropriate patient status for this patient is INPATIENT. Inpatient status is judged to be reasonable and necessary in order to provide the required intensity of service to ensure the patient's safety. The patient's presenting symptoms, physical exam findings, and initial radiographic and laboratory data in the context of their chronic comorbidities is felt to place them at high risk for further clinical deterioration. Furthermore, it is not anticipated that the patient will be medically stable for discharge from the hospital within 2 midnights of admission. The following factors support the  patient status of inpatient.   " The patient's presenting symptoms include shortness of breath. " The worrisome physical exam findings include metabolic encephalopathy with respiratory distress. " The initial radiographic and laboratory data are worrisome because of multifocal pneumonia with evidence of sepsis. " The chronic co-morbidities include dementia.   * I certify that at the point of admission it is my clinical judgment that the patient will require inpatient hospital care spanning beyond 2 midnights from the point of admission due to high intensity of service, high risk for further deterioration and high frequency of surveillance required.Barbette Merino MD [facility] Pager [phone]  If 7PM-7AM, please contact night-coverage [email or web address] Password [facility]  06/23/2020, 11:57 PM

## 2020-06-23 NOTE — ED Notes (Signed)
Patient having gargled respirations, deep suctioned by Lorriane Shire RN and respiratory with improvement.

## 2020-06-23 NOTE — Progress Notes (Signed)
Code Sepsis initiated at 4210 PM.  Anneliese Leblond Elink RN

## 2020-06-23 NOTE — ED Notes (Signed)
Date and time results received:   Test: Lactic  Critical Value: 2.4  Name of Provider Notified: Bradler  Orders Received? Or Actions Taken?:

## 2020-06-23 NOTE — ED Notes (Signed)
MD Bradler on phone with wife.

## 2020-06-23 NOTE — ED Notes (Signed)
Pt presents to ED via EMS from local facility with complete of SOB and room 02 sat of 80's per EMS and triage nurse. Pt is currently on 4L/min and maintaining a O2 sat in the 90's. Pt is at times non verbal. Pt does have a produticve type cough and pt is verbal at times but this RN was told he has dementia. Pt is warm to touch but has a temp of 98.6. Pt is tachycardiac at this time. Pt has slight mottling to lower extremities this RN is not sure if this is patients baseline.

## 2020-06-23 NOTE — Progress Notes (Signed)
Pharmacy Antibiotic Note  David Morgan is a 86 y.o. male admitted on 06/23/2020 with pneumonia.  Pharmacy has been consulted for Cefepime and Vancomycin dosing.  Plan: Patient received Vancomycin 1000mg  IV times 1 in the ED, will order Vancomycin 750mg  IV q12h to follow.  Cefepime 2g IV q12h  Follow renal function closely Follow up on MRSA PCR screening  Height: 5\' 9"  (175.3 cm) Weight: 81.6 kg (180 lb) IBW/kg (Calculated) : 70.7  Temp (24hrs), Avg:98.8 F (37.1 C), Min:98.8 F (37.1 C), Max:98.8 F (37.1 C)  Recent Labs  Lab 06/23/20 1731 06/23/20 1936  WBC 15.9*  --   CREATININE 0.79  --   LATICACIDVEN 2.4* 1.5    Estimated Creatinine Clearance: 57.7 mL/min (by C-G formula based on SCr of 0.79 mg/dL).    Allergies  Allergen Reactions  . Adhesive [Tape] Rash  . Augmentin [Amoxicillin-Pot Clavulanate] Itching, Rash and Other (See Comments)    Has patient had a PCN reaction causing immediate rash, facial/tongue/throat swelling, SOB or lightheadedness with hypotension: No Has patient had a PCN reaction causing severe rash involving mucus membranes or skin necrosis: No Has patient had a PCN reaction that required hospitalization No Has patient had a PCN reaction occurring within the last 10 years: No If all of the above answers are "NO", then may proceed with Cephalosporin use.     Antimicrobials this admission: Cefepime 11/6 >>  Vancomycin 11/6 >>   Dose adjustments this admission:  Microbiology results: 11/6 BCx:  11/6 UCx:   11/6 Respiratory Panel by RT PCR:   11/6 MRSA PCR:   Thank you for allowing pharmacy to be a part of this patient's care.  Vira Blanco 06/23/2020 8:47 PM

## 2020-06-23 NOTE — Progress Notes (Signed)
PHARMACY -  BRIEF ANTIBIOTIC NOTE   Pharmacy has received consult(s) for Cefepime and Vancomycin from an ED provider.  The patient's profile has been reviewed for ht/wt/allergies/indication/available labs.    One time order(s) placed for Cefepime 2g and Vancomycin 1g by ED provider.  Further antibiotics/pharmacy consults should be ordered by admitting physician if indicated.                       Thank you, Vira Blanco 06/23/2020  7:36 PM

## 2020-06-23 NOTE — ED Notes (Addendum)
MD Cheri Fowler advising wife does not want intubation and to continue NRB.

## 2020-06-23 NOTE — ED Triage Notes (Signed)
Pt presents to ED via [facility] from [facility]. Per EMS upon arrival pt noted to be laying flat in bed with cough and noted rhonchi on auscultation. EMS reprots RA sats in the 80's upon arrival, on 4L via [place] pt's sats 96%.  Pt noted to be tachycardic, a-febrile however feels warm to touch. Pt also noted to have dusky color to extremities. Per EMS pt with hx of vascular dementia and speaks very little/confusion at baseline.   [facility].

## 2020-06-23 NOTE — ED Notes (Signed)
This RN entered room to obtain covid swab and patient had removed nasal cannula, O2 saturation 86%. Pt only responding sternal rub, was answering questions and following commands 15-20 minutes ago. Pt noted to be hypotensive at 69/53. Bradler MD made aware and responded to bedside.

## 2020-06-23 NOTE — ED Notes (Signed)
Pt with gargling respirations,suctioned again with improvement.

## 2020-06-24 DIAGNOSIS — J181 Lobar pneumonia, unspecified organism: Secondary | ICD-10-CM

## 2020-06-24 DIAGNOSIS — R627 Adult failure to thrive: Secondary | ICD-10-CM

## 2020-06-24 LAB — COMPREHENSIVE METABOLIC PANEL
ALT: 12 U/L (ref 0–44)
AST: 23 U/L (ref 15–41)
Albumin: 2.8 g/dL — ABNORMAL LOW (ref 3.5–5.0)
Alkaline Phosphatase: 67 U/L (ref 38–126)
Anion gap: 9 (ref 5–15)
BUN: 28 mg/dL — ABNORMAL HIGH (ref 8–23)
CO2: 24 mmol/L (ref 22–32)
Calcium: 8.5 mg/dL — ABNORMAL LOW (ref 8.9–10.3)
Chloride: 108 mmol/L (ref 98–111)
Creatinine, Ser: 1.61 mg/dL — ABNORMAL HIGH (ref 0.61–1.24)
GFR, Estimated: 40 mL/min — ABNORMAL LOW (ref >60)
Glucose, Bld: 196 mg/dL — ABNORMAL HIGH (ref 70–99)
Potassium: 4.2 mmol/L (ref 3.5–5.1)
Sodium: 141 mmol/L (ref 135–145)
Total Bilirubin: 1.1 mg/dL (ref 0.3–1.2)
Total Protein: 6.4 g/dL — ABNORMAL LOW (ref 6.5–8.1)

## 2020-06-24 LAB — CBC
HCT: 44.2 % (ref 39.0–52.0)
Hemoglobin: 14 g/dL (ref 13.0–17.0)
MCH: 28.2 pg (ref 26.0–34.0)
MCHC: 31.7 g/dL (ref 30.0–36.0)
MCV: 88.9 fL (ref 80.0–100.0)
Platelets: 155 10*3/uL (ref 150–400)
RBC: 4.97 MIL/uL (ref 4.22–5.81)
RDW: 15.7 % — ABNORMAL HIGH (ref 11.5–15.5)
WBC: 19.8 10*3/uL — ABNORMAL HIGH (ref 4.0–10.5)
nRBC: 0 % (ref 0.0–0.2)

## 2020-06-24 LAB — PROTIME-INR
INR: 1.2 (ref 0.8–1.2)
Prothrombin Time: 14.4 seconds (ref 11.4–15.2)

## 2020-06-24 LAB — PROCALCITONIN: Procalcitonin: 27.86 ng/mL

## 2020-06-24 LAB — CORTISOL-AM, BLOOD: Cortisol - AM: 35 ug/dL — ABNORMAL HIGH (ref 6.7–22.6)

## 2020-06-24 MED ORDER — LORAZEPAM 2 MG/ML IJ SOLN: 1.0000 mg | INTRAMUSCULAR | Status: DC | PRN

## 2020-06-24 MED ORDER — MORPHINE SULFATE (PF) 2 MG/ML IV SOLN: 1.0000 mg | INTRAVENOUS | Status: DC | PRN

## 2020-06-24 MED ORDER — LORAZEPAM 1 MG PO TABS: 1.0000 mg | ORAL_TABLET | ORAL | Status: DC | PRN

## 2020-06-24 MED ORDER — GLYCOPYRROLATE 1 MG PO TABS
1.0000 mg | ORAL_TABLET | ORAL | Status: DC | PRN
  Filled 2020-06-24: qty 1

## 2020-06-24 MED ORDER — HYDROMORPHONE HCL 1 MG/ML IJ SOLN: 0.5000 mg | INTRAMUSCULAR | Status: DC | PRN

## 2020-06-24 MED ORDER — LORAZEPAM 2 MG/ML PO CONC: 1.0000 mg | ORAL | Status: DC | PRN

## 2020-06-24 MED ORDER — GLYCOPYRROLATE 0.2 MG/ML IJ SOLN
0.2000 mg | INTRAMUSCULAR | Status: DC | PRN
  Filled 2020-06-24: qty 1

## 2020-06-24 NOTE — Progress Notes (Signed)
Elink Code Sepsis Completion Note:  BC drawn before ABX administered. LA 1.5, down from 2. 4. Had approx 1250 ml IVF and has orders for Progressive Cardiac unit for further monitoring.   Chaka Jefferys eLink RN

## 2020-06-24 NOTE — Progress Notes (Signed)
PROGRESS NOTE    David Morgan  KCL:[id number] DOB: Dec 30, 1924 DOA: 06/23/2020 PCP: Juluis Pitch, MD    Assessment & Plan:   Principal Problem:   Severe sepsis ([place]) Active Problems:   BP (high blood pressure)   HLD (hyperlipidemia)   BPH (benign prostatic hyperplasia)   Chronic dementia without behavioral disturbance (HCC)   Hyperglycemia due to type 2 diabetes mellitus ([place])   COPD with acute exacerbation (HCC)   Acute diastolic CHF (congestive heart failure) ([place])   Lobar pneumonia ([place])  Failure to thrive: secondary to all below. After a discussion with the pt's wife, she has decided to proceed w/ comfort care only.   Severe sepsis: meets criteria w/ fever, elevated WBC, tachypnea, tachycardia, altered mental status & multifocal pneumonia.   Multifocal pneumonia: d/c IV abxs. Comfort care only   Dementia: end stage. Continue w/ supportive care.    HTN: hold all anti-HTN.   DM2: hold all insulin and anti-DM meds. Comfort care only   HLD: hold statin       DVT prophylaxis: comfort care only  Code Status: DNR Family Communication: discussed pt's care w/ pt's wife at bedside and answered all of her questions Disposition Plan: unclear, comfort care only. Will consult hospice tomorrow as hospice does not see pts here on the weekends   Status is: Inpatient  Remains inpatient appropriate because:Hemodynamically unstable, Unsafe d/c plan and Inpatient level of care appropriate due to severity of illness   Dispo: The patient is from: Home              Anticipated d/c is to:  Unknown               Anticipated d/c date is: > 3 days              Patient currently is not medically stable to d/c.     Consultants:     Procedures:   Antimicrobials:    Subjective: Pt is unable to verbalize his response   Objective: Vitals:   06/24/20 0630 06/24/20 0645 06/24/20 0700 06/24/20 0730  BP: (!) 121/50  (!) 112/54 115/73  Pulse: (!) 45 96 86 82    Resp: (!) 32 (!) 32 (!) 25 (!) 26  Temp:      TempSrc:      SpO2: 91%  97% 99%  Weight:      Height:       No intake or output data in the 24 hours ending 06/24/20 0759 Filed Weights   06/23/20 1720  Weight: 81.6 kg    Examination:  General exam: Appears uncomfortable Respiratory system: course breath sounds b/l  Cardiovascular system: S1 & S2 +. No rubs or gallops Gastrointestinal system: Abdomen is nondistended, soft and nontender. hypoactive bowel sounds heard. Central nervous system: Lethargic.  Psychiatry: Judgement and insight appear abnormal. .     Data Reviewed: I have personally reviewed following labs and imaging studies  CBC: Recent Labs  Lab 06/23/20 1731 06/23/20 2057 06/24/20 0524  WBC 15.9* 14.4* 19.8*  NEUTROABS 13.3*  --   --   HGB 15.9 16.3 14.0  HCT 51.3 52.9* 44.2  MCV 89.5 90.7 88.9  PLT 187 186 494   Basic Metabolic Panel: Recent Labs  Lab 06/23/20 1731 06/23/20 2057 06/24/20 0524  NA 140  --  141  K 3.9  --  4.2  CL 101  --  108  CO2 26  --  24  GLUCOSE 161*  --  196*  BUN 18  --  28*  CREATININE 0.79 1.06 1.61*  CALCIUM 9.2  --  8.5*   GFR: Estimated Creatinine Clearance: 28.7 mL/min (A) (by C-G formula based on SCr of 1.61 mg/dL (H)). Liver Function Tests: Recent Labs  Lab 06/23/20 1731 06/24/20 0524  AST 21 23  ALT 13 12  ALKPHOS 94 67  BILITOT 1.3* 1.1  PROT 8.5* 6.4*  ALBUMIN 3.9 2.8*   No results for input(s): LIPASE, AMYLASE in the last 168 hours. No results for input(s): AMMONIA in the last 168 hours. Coagulation Profile: Recent Labs  Lab 06/23/20 1731 06/24/20 0524  INR 1.1 1.2   Cardiac Enzymes: No results for input(s): CKTOTAL, CKMB, CKMBINDEX, TROPONINI in the last 168 hours. BNP (last 3 results) No results for input(s): PROBNP in the last 8760 hours. HbA1C: No results for input(s): HGBA1C in the last 72 hours. CBG: No results for input(s): GLUCAP in the last 168 hours. Lipid Profile: No results  for input(s): CHOL, HDL, LDLCALC, TRIG, CHOLHDL, LDLDIRECT in the last 72 hours. Thyroid Function Tests: No results for input(s): TSH, T4TOTAL, FREET4, T3FREE, THYROIDAB in the last 72 hours. Anemia Panel: No results for input(s): VITAMINB12, FOLATE, FERRITIN, TIBC, IRON, RETICCTPCT in the last 72 hours. Sepsis Labs: Recent Labs  Lab 06/23/20 1731 06/23/20 1936 06/24/20 0524  PROCALCITON  --   --  27.86  LATICACIDVEN 2.4* 1.5  --     Recent Results (from the past 240 hour(s))  Respiratory Panel by RT PCR (Flu A&B, Covid) - Nasopharyngeal Swab     Status: None   Collection Time: 06/23/20  7:54 PM   Specimen: Nasopharyngeal Swab  Result Value Ref Range Status   SARS Coronavirus 2 by RT PCR NEGATIVE NEGATIVE Final    Comment: (NOTE) SARS-CoV-2 target nucleic acids are NOT DETECTED.  The SARS-CoV-2 RNA is generally detectable in upper respiratoy specimens during the acute phase of infection. The lowest concentration of SARS-CoV-2 viral copies this assay can detect is 131 copies/mL. A negative result does not preclude SARS-Cov-2 infection and should not be used as the sole basis for treatment or other patient management decisions. A negative result may occur with  improper specimen collection/handling, submission of specimen other than nasopharyngeal swab, presence of viral mutation(s) within the areas targeted by this assay, and inadequate number of viral copies (<131 copies/mL). A negative result must be combined with clinical observations, patient history, and epidemiological information. The expected result is Negative.  Fact Sheet for Patients:  PinkCheek.be  Fact Sheet for Healthcare Providers:  GravelBags.it  This test is no t yet approved or cleared by the [place] FDA and  has been authorized for detection and/or diagnosis of SARS-CoV-2 by FDA under an Emergency Use Authorization (EUA). This EUA will remain   in effect (meaning this test can be used) for the duration of the COVID-19 declaration under Section 564(b)(1) of the Act, 21 U.S.C. section 360bbb-3(b)(1), unless the authorization is terminated or revoked sooner.     Influenza A by PCR NEGATIVE NEGATIVE Final   Influenza B by PCR NEGATIVE NEGATIVE Final    Comment: (NOTE) The Xpert Xpress SARS-CoV-2/FLU/RSV assay is intended as an aid in  the diagnosis of influenza from Nasopharyngeal swab specimens and  should not be used as a sole basis for treatment. Nasal washings and  aspirates are unacceptable for Xpert Xpress SARS-CoV-2/FLU/RSV  testing.  Fact Sheet for Patients: PinkCheek.be  Fact Sheet for Healthcare Providers: GravelBags.it  This test is not yet approved  or cleared by the [place] and  has been authorized for detection and/or diagnosis of SARS-CoV-2 by  FDA under an Emergency Use Authorization (EUA). This EUA will remain  in effect (meaning this test can be used) for the duration of the  Covid-19 declaration under Section 564(b)(1) of the Act, 21  U.S.C. section 360bbb-3(b)(1), unless the authorization is  terminated or revoked. Performed at [facility], [address] [place], [place] [address]          Radiology Studies: DG Chest Portable 1 View  Result Date: 06/23/2020 CLINICAL DATA:  Cough and shortness of breath. EXAM: PORTABLE CHEST 1 VIEW COMPARISON:  11/30/2019 FINDINGS: Patient is rotated. Asymmetric patchy airspace disease involving the right lung and to a lesser extent left lung base. Heart size normal. No significant pleural fluid or pneumothorax. Patient's chin obscures the right lung apex. IMPRESSION: Asymmetric patchy airspace disease involving the right lung and to a lesser extent left lung base, suspicious for multifocal pneumonia. Rotated exam which limits assessment. Electronically Signed   By: Keith Rake M.D.    On: 06/23/2020 18:44        Scheduled Meds: . enoxaparin (LOVENOX) injection  40 mg Subcutaneous Q24H   Continuous Infusions: . ceFEPime (MAXIPIME) IV    . lactated ringers Stopped (06/23/20 2043)  . lactated ringers 125 mL/hr at 06/24/20 0347  . vancomycin       LOS: 1 day    Time spent: 33 mins    Wyvonnia Dusky, MD [facility] Pager [phone]  If 7PM-7AM, please contact night-coverage [email or web address] 06/24/2020, 7:59 AM

## 2020-06-24 NOTE — ED Notes (Signed)
Dr Williams at bedside 

## 2020-06-24 NOTE — ED Notes (Signed)
Advised nurse that patient has assigned bed 

## 2020-06-24 NOTE — ED Notes (Signed)
Pt with gargling respirations,suctioned again with improvement.

## 2020-06-24 NOTE — ED Notes (Signed)
Wife at bedside.

## 2020-06-25 LAB — URINE CULTURE: Culture: <10000 — AB

## 2020-06-25 NOTE — TOC Initial Note (Signed)
Transition of Care [facility]) - Initial/Assessment Note    Patient Details  Name: David Morgan MRN: [id number] Date of Birth: 11-21-1924  [facility] of [facility] CM/SW Contact:    Shelbie Hutching, RN Phone Number: 06/25/2020, 11:18 AM  Clinical Narrative:                 Patient admitted to the hospital with pneumonia.  Patient is from [facility] where he is long term care.  Patient is now comfort care, patient's wife is at the bedside.  David Morgan, pt's wife, voices that she would like for patient to stay here in the hospital.  RNCM discussed going back to [facility] with Hospice, going home with hospice, or going to the Hospice Home.  Wife cannot take care of the patient at home and she does not want him to go back to [facility].  Wife reports that she will speak with the [facility] about the Hospice home.  Santiago Glad and Kieth Brightly with [facility] care given hospice home referral.   Patient's wife will discuss hospice home with her son and daughter before making a decision.   TOC will cont to follow.   Expected Discharge Plan: Hospice Medical Facility Barriers to Discharge: Other (comment)   Patient Goals and CMS Choice Patient states their goals for this hospitalization and ongoing recovery are:: Patient is comfort care, patient's wife would like for the patient to stay here at the hospital CMS Medicare.gov Compare Post Acute Care list provided to:: Patient Represenative (must comment) Choice offered to / list presented to : Spouse  Expected Discharge Plan and Services Expected Discharge Plan: [place] In-house Referral: Hospice / Palliative Care Discharge Planning Services: CM Consult Post Acute Care Choice: Hospice Living arrangements for the past 2 months: [place]                   DME Agency: NA       HH Arranged: NA          Prior Living Arrangements/Services Living arrangements for the past 2 months: [place] Lives with::  Facility Resident Patient language and need for interpreter reviewed:: Yes        Need for Family Participation in Patient Care: Yes (Comment) (pneumonia - comfort care) Care giver support system in place?: Yes (comment) (wife and children)   Criminal Activity/Legal Involvement Pertinent to Current Situation/Hospitalization: No - Comment as needed  Activities of Daily Living   ADL Screening (condition at time of admission) Patient's cognitive ability adequate to safely complete daily activities?: No Is the patient deaf or have difficulty hearing?: Yes Does the patient have difficulty seeing, even when wearing glasses/contacts?: Yes Does the patient have difficulty concentrating, remembering, or making decisions?: Yes Patient able to express need for assistance with ADLs?: No Does the patient have difficulty dressing or bathing?: Yes Independently performs ADLs?: No Communication: Dependent Is this a change from baseline?: Pre-admission baseline Dressing (OT): Dependent Is this a change from baseline?: Pre-admission baseline Grooming: Dependent Is this a change from baseline?: Pre-admission baseline Feeding: Dependent Is this a change from baseline?: Pre-admission baseline Bathing: Dependent Is this a change from baseline?: Pre-admission baseline Toileting: Dependent Is this a change from baseline?: Pre-admission baseline In/Out Bed: Dependent Is this a change from baseline?: Pre-admission baseline Walks in Home: Dependent Is this a change from baseline?: Pre-admission baseline Does the patient have difficulty walking or climbing stairs?: Yes Weakness of Legs: Both Weakness of Arms/Hands: Both  Permission  Sought/Granted Permission sought to share information with : Case Freight forwarder, Customer service manager, Family Supports Permission granted to share information with : Yes, Verbal Permission Granted  Share Information with NAME: David Morgan  Permission granted to share info w  AGENCY: [place] granted to share info w Relationship: wife     Emotional Assessment Appearance:: Appears stated age Attitude/Demeanor/Rapport: Lethargic Affect (typically observed): Unable to Assess Orientation: : Oriented to Self Alcohol / Substance Use: Not Applicable Psych Involvement: No (comment)  Admission diagnosis:  Acute respiratory failure with hypoxia ([place]) [J96.01] Sepsis ([place]) [A41.9] Severe sepsis ([place]) [A41.9, R65.20] Multifocal pneumonia [J18.9] Sepsis with acute hypoxic respiratory failure without septic shock, due to unspecified organism ([place]) [A41.9, R65.20, J96.01] Patient Active Problem List   Diagnosis Date Noted  . Lobar pneumonia ([place]) 06/23/2020  . Chronic diastolic CHF (congestive heart failure) ([place])   . Sepsis ([place])   . Goals of care, counseling/discussion   . Palliative care by specialist   . Acute pancreatitis   . Bradycardia   . Lactic acidosis   . Sepsis associated hypotension ([place]) 11/28/2019  . Acute diastolic CHF (congestive heart failure) ([place]) 11/21/2019  . Pulmonary vascular congestion 11/20/2019  . Choledocholithiasis   . Chronic dementia without behavioral disturbance ([place]) 11/19/2019  . Accident due to mechanical fall without injury 11/19/2019  . Hyperglycemia due to type 2 diabetes mellitus ([place]) 11/19/2019  . Leukocytosis 11/19/2019  . Acute kidney injury superimposed on CKD ([place]) 11/19/2019  . Troponin I above reference range 11/19/2019  . COPD with acute exacerbation ([place]) 11/19/2019  . Severe sepsis ([place]) 11/19/2019  . Elevated liver enzymes 11/18/2019  . Cholelithiasis 11/18/2019  . Pressure injury of skin 12/22/2017  . Back pain 12/21/2017  . History of diverticulitis 05/02/2016  . Difficulty walking 02/04/2016  . Tremor 02/04/2016  . Weakness 02/04/2016  . Disease of thyroid gland 07/10/2015  . BP (high blood pressure) 07/10/2015  . HLD (hyperlipidemia) 07/10/2015  . BPH (benign prostatic  hyperplasia) 08/02/2014   PCP:  Juluis Pitch, MD Pharmacy:   [facility] [address], [place] [address] Phone: [phone] Fax: [place], [place] [address] [place] [address] Phone: [phone] Fax: [place], [place] [address] Phone: [phone] Fax: [phone]     Social Determinants of Health (SDOH) Interventions    Readmission Risk Interventions No flowsheet data found.

## 2020-06-25 NOTE — Progress Notes (Signed)
PROGRESS NOTE    David Morgan  GUY:[id number] DOB: 16-Mar-1925 DOA: 06/23/2020 PCP: Juluis Pitch, MD    Assessment & Plan:   Principal Problem:   Severe sepsis ([place]) Active Problems:   BP (high blood pressure)   HLD (hyperlipidemia)   BPH (benign prostatic hyperplasia)   Chronic dementia without behavioral disturbance (HCC)   Hyperglycemia due to type 2 diabetes mellitus ([place])   COPD with acute exacerbation (HCC)   Acute diastolic CHF (congestive heart failure) ([place])   Lobar pneumonia ([place])  Failure to thrive: secondary to all below. After a discussion with the pt's wife, she has decided to proceed w/ comfort care only.   Severe sepsis: meets criteria w/ fever, elevated WBC, tachypnea, tachycardia, altered mental status & multifocal pneumonia.   Multifocal pneumonia: d/c IV abxs. Comfort care only   Dementia: end stage. Continue w/ supportive care.    HTN: hold all anti-HTN.   DM2: hold all insulin and anti-DM meds. Comfort care only   HLD: hold statin       DVT prophylaxis: comfort care only  Code Status: DNR Family Communication: discussed pt's care w/ pt's wife at bedside and answered all of her questions  Disposition Plan: unclear, comfort care only. Status is: Inpatient  Remains inpatient appropriate because:Hemodynamically unstable, Unsafe d/c plan and Inpatient level of care appropriate due to severity of illness   Dispo: The patient is from: Home              Anticipated d/c is to:  Unknown               Anticipated d/c date is: > 3 days              Patient currently is not medically stable to d/c.     Consultants:  Hospice   Procedures:   Antimicrobials:    Subjective: Pt is lethargic   Objective: Vitals:   06/24/20 1217 06/24/20 1551 06/24/20 1732 06/25/20 0459  BP: 122/66 (!) 115/50 119/62 (!) 114/102  Pulse: 89 93 89 (!) 44  Resp: (!) 22 (!) 21 18 16   Temp:  98.1 F (36.7 C) 98.7 F (37.1 C) 98.6 F (37 C)    TempSrc: Oral Oral Axillary Oral  SpO2: 95% 93% 95% 98%  Weight:      Height:        Intake/Output Summary (Last 24 hours) at 06/25/2020 0721 Last data filed at 06/25/2020 0600 Gross per 24 hour  Intake 900 ml  Output 500 ml  Net 400 ml   Filed Weights   06/23/20 1720  Weight: 81.6 kg    Examination:  General exam: Appears calm and comfortable  Respiratory system: diminished breath sounds b/l  Cardiovascular system: S1/S2+. No rubs or gallops Gastrointestinal system: Abdomen is nondistended, soft and nontender. hypoactive bowel sounds heard. Central nervous system: lethargic Psychiatry: Judgement and insight appear abnormal. .     Data Reviewed: I have personally reviewed following labs and imaging studies  CBC: Recent Labs  Lab 06/23/20 1731 06/23/20 2057 06/24/20 0524  WBC 15.9* 14.4* 19.8*  NEUTROABS 13.3*  --   --   HGB 15.9 16.3 14.0  HCT 51.3 52.9* 44.2  MCV 89.5 90.7 88.9  PLT 187 186 563   Basic Metabolic Panel: Recent Labs  Lab 06/23/20 1731 06/23/20 2057 06/24/20 0524  NA 140  --  141  K 3.9  --  4.2  CL 101  --  108  CO2 26  --  24  GLUCOSE 161*  --  196*  BUN 18  --  28*  CREATININE 0.79 1.06 1.61*  CALCIUM 9.2  --  8.5*   GFR: Estimated Creatinine Clearance: 28.7 mL/min (A) (by C-G formula based on SCr of 1.61 mg/dL (H)). Liver Function Tests: Recent Labs  Lab 06/23/20 1731 06/24/20 0524  AST 21 23  ALT 13 12  ALKPHOS 94 67  BILITOT 1.3* 1.1  PROT 8.5* 6.4*  ALBUMIN 3.9 2.8*   No results for input(s): LIPASE, AMYLASE in the last 168 hours. No results for input(s): AMMONIA in the last 168 hours. Coagulation Profile: Recent Labs  Lab 06/23/20 1731 06/24/20 0524  INR 1.1 1.2   Cardiac Enzymes: No results for input(s): CKTOTAL, CKMB, CKMBINDEX, TROPONINI in the last 168 hours. BNP (last 3 results) No results for input(s): PROBNP in the last 8760 hours. HbA1C: No results for input(s): HGBA1C in the last 72 hours. CBG: No  results for input(s): GLUCAP in the last 168 hours. Lipid Profile: No results for input(s): CHOL, HDL, LDLCALC, TRIG, CHOLHDL, LDLDIRECT in the last 72 hours. Thyroid Function Tests: No results for input(s): TSH, T4TOTAL, FREET4, T3FREE, THYROIDAB in the last 72 hours. Anemia Panel: No results for input(s): VITAMINB12, FOLATE, FERRITIN, TIBC, IRON, RETICCTPCT in the last 72 hours. Sepsis Labs: Recent Labs  Lab 06/23/20 1731 06/23/20 1936 06/24/20 0524  PROCALCITON  --   --  27.86  LATICACIDVEN 2.4* 1.5  --     Recent Results (from the past 240 hour(s))  Culture, blood (Routine x 2)     Status: None (Preliminary result)   Collection Time: 06/23/20  5:31 PM   Specimen: BLOOD  Result Value Ref Range Status   Specimen Description BLOOD RIGHT WRIST  Final   Special Requests   Final    BOTTLES DRAWN AEROBIC AND ANAEROBIC Blood Culture adequate volume   Culture   Final    NO GROWTH 2 DAYS Performed at [facility], [address] [place], [place] [address]    Report Status PENDING  Incomplete  Culture, blood (Routine x 2)     Status: None (Preliminary result)   Collection Time: 06/23/20  7:36 PM   Specimen: BLOOD  Result Value Ref Range Status   Specimen Description BLOOD BLOOD LEFT FOREARM  Final   Special Requests   Final    BOTTLES DRAWN AEROBIC AND ANAEROBIC Blood Culture results may not be optimal due to an excessive volume of blood received in culture bottles   Culture   Final    NO GROWTH 2 DAYS Performed at [facility], [address] [place], [place] [address]    Report Status PENDING  Incomplete  Respiratory Panel by RT PCR (Flu A&B, Covid) - Nasopharyngeal Swab     Status: None   Collection Time: 06/23/20  7:54 PM   Specimen: Nasopharyngeal Swab  Result Value Ref Range Status   SARS Coronavirus 2 by RT PCR NEGATIVE NEGATIVE Final    Comment: (NOTE) SARS-CoV-2 target nucleic acids are NOT DETECTED.  The SARS-CoV-2 RNA is generally detectable  in upper respiratoy specimens during the acute phase of infection. The lowest concentration of SARS-CoV-2 viral copies this assay can detect is 131 copies/mL. A negative result does not preclude SARS-Cov-2 infection and should not be used as the sole basis for treatment or other patient management decisions. A negative result may occur with  improper specimen collection/handling, submission of specimen other than nasopharyngeal swab, presence of viral mutation(s) within  the areas targeted by this assay, and inadequate number of viral copies (<131 copies/mL). A negative result must be combined with clinical observations, patient history, and epidemiological information. The expected result is Negative.  Fact Sheet for Patients:  PinkCheek.be  Fact Sheet for Healthcare Providers:  GravelBags.it  This test is no t yet approved or cleared by the [place] FDA and  has been authorized for detection and/or diagnosis of SARS-CoV-2 by FDA under an Emergency Use Authorization (EUA). This EUA will remain  in effect (meaning this test can be used) for the duration of the COVID-19 declaration under Section 564(b)(1) of the Act, 21 U.S.C. section 360bbb-3(b)(1), unless the authorization is terminated or revoked sooner.     Influenza A by PCR NEGATIVE NEGATIVE Final   Influenza B by PCR NEGATIVE NEGATIVE Final    Comment: (NOTE) The Xpert Xpress SARS-CoV-2/FLU/RSV assay is intended as an aid in  the diagnosis of influenza from Nasopharyngeal swab specimens and  should not be used as a sole basis for treatment. Nasal washings and  aspirates are unacceptable for Xpert Xpress SARS-CoV-2/FLU/RSV  testing.  Fact Sheet for Patients: PinkCheek.be  Fact Sheet for Healthcare Providers: GravelBags.it  This test is not yet approved or cleared by the [place] FDA and  has been  authorized for detection and/or diagnosis of SARS-CoV-2 by  FDA under an Emergency Use Authorization (EUA). This EUA will remain  in effect (meaning this test can be used) for the duration of the  Covid-19 declaration under Section 564(b)(1) of the Act, 21  U.S.C. section 360bbb-3(b)(1), unless the authorization is  terminated or revoked. Performed at [facility], [address], [place], [place] [address]          Radiology Studies: DG Chest Portable 1 View  Result Date: 06/23/2020 CLINICAL DATA:  Cough and shortness of breath. EXAM: PORTABLE CHEST 1 VIEW COMPARISON:  11/30/2019 FINDINGS: Patient is rotated. Asymmetric patchy airspace disease involving the right lung and to a lesser extent left lung base. Heart size normal. No significant pleural fluid or pneumothorax. Patient's chin obscures the right lung apex. IMPRESSION: Asymmetric patchy airspace disease involving the right lung and to a lesser extent left lung base, suspicious for multifocal pneumonia. Rotated exam which limits assessment. Electronically Signed   By: Keith Rake M.D.   On: 06/23/2020 18:44        Scheduled Meds:  Continuous Infusions:    LOS: 2 days    Time spent: 30 mins    Wyvonnia Dusky, MD [facility] Pager [phone]  If 7PM-7AM, please contact night-coverage [email or web address] 06/25/2020, 7:21 AM

## 2020-06-25 NOTE — Progress Notes (Signed)
[place] Room [facility]) Hospital Liaison RN note:  Family requested information about the Hospice Home and services provided. Spoke with wife, David Morgan in the room and provided information. She  verbalized understanding and all questions were answered. David Morgan is going to discuss information with her son and daughter and she will let [facility] RN know their decision. Contact numbers provided to [place]. Hospital care team has been updated.  Thank you.  Zandra Abts, RN [facility] Liaison [phone]

## 2020-06-26 LAB — GLUCOSE, CAPILLARY
Glucose-Capillary: 172 mg/dL — ABNORMAL HIGH (ref 70–99)
Glucose-Capillary: 97 mg/dL (ref 70–99)

## 2020-06-26 MED ORDER — IPRATROPIUM-ALBUTEROL 0.5-2.5 (3) MG/3ML IN SOLN
RESPIRATORY_TRACT | Status: AC
  Filled 2020-06-26: qty 3

## 2020-06-26 MED ORDER — LEVOFLOXACIN IN D5W 750 MG/150ML IV SOLN
750.0000 mg | INTRAVENOUS | Status: DC
  Administered 2020-06-26: 750 mg via INTRAVENOUS
  Filled 2020-06-26: qty 150

## 2020-06-26 MED ORDER — METHYLPREDNISOLONE SODIUM SUCC 40 MG IJ SOLR
40.0000 mg | Freq: Two times a day (BID) | INTRAMUSCULAR | Status: DC
  Administered 2020-06-26 – 2020-06-27 (×4): 40 mg via INTRAVENOUS
  Filled 2020-06-26 (×4): qty 1

## 2020-06-26 MED ORDER — DONEPEZIL HCL 5 MG PO TABS
5.0000 mg | ORAL_TABLET | Freq: Every day | ORAL | Status: DC
  Administered 2020-06-26 – 2020-06-28 (×3): 5 mg via ORAL
  Filled 2020-06-26 (×3): qty 1

## 2020-06-26 MED ORDER — LEVOTHYROXINE SODIUM 112 MCG PO TABS
112.0000 ug | ORAL_TABLET | Freq: Every day | ORAL | Status: DC
  Administered 2020-06-27 – 2020-06-29 (×3): 112 ug via ORAL
  Filled 2020-06-26 (×3): qty 1

## 2020-06-26 MED ORDER — TAMSULOSIN HCL 0.4 MG PO CAPS
0.4000 mg | ORAL_CAPSULE | Freq: Every day | ORAL | Status: DC
  Administered 2020-06-27 – 2020-06-29 (×3): 0.4 mg via ORAL
  Filled 2020-06-26 (×3): qty 1

## 2020-06-26 MED ORDER — IPRATROPIUM-ALBUTEROL 0.5-2.5 (3) MG/3ML IN SOLN
3.0000 mL | Freq: Four times a day (QID) | RESPIRATORY_TRACT | Status: DC
  Administered 2020-06-26 (×3): 3 mL via RESPIRATORY_TRACT
  Filled 2020-06-26 (×7): qty 3

## 2020-06-26 MED ORDER — BENZONATATE 100 MG PO CAPS
200.0000 mg | ORAL_CAPSULE | Freq: Three times a day (TID) | ORAL | Status: DC | PRN
  Administered 2020-06-26: 11:00:00 200 mg via ORAL
  Filled 2020-06-26: qty 2

## 2020-06-26 MED ORDER — INSULIN ASPART 100 UNIT/ML ~~LOC~~ SOLN
0.0000 [IU] | Freq: Three times a day (TID) | SUBCUTANEOUS | Status: DC
  Administered 2020-06-26 – 2020-06-27 (×2): 1 [IU] via SUBCUTANEOUS
  Administered 2020-06-27: 2 [IU] via SUBCUTANEOUS
  Administered 2020-06-28 (×2): 1 [IU] via SUBCUTANEOUS
  Administered 2020-06-28 – 2020-06-29 (×2): 2 [IU] via SUBCUTANEOUS
  Filled 2020-06-26 (×8): qty 1

## 2020-06-26 MED ORDER — SODIUM CHLORIDE 0.9 % IV SOLN: 500.0000 mg | INTRAVENOUS | Status: DC

## 2020-06-26 NOTE — Progress Notes (Signed)
Wife at bedside, pt with no complaint at this time

## 2020-06-26 NOTE — NC FL2 (Signed)
[place] LEVEL OF CARE SCREENING TOOL     IDENTIFICATION  Patient Name: David Morgan Birthdate: 10-12-1924 Sex: male Admission Date (Current Location): 06/23/2020  [employer] and [place] Number:  Engineering geologist and Address:  [facility], [address] [place], [place] [address]      Provider Number: [id number]  Attending Physician Name and Address:  Wyvonnia Dusky, MD  Relative Name and Phone Number:  Deryck Hippler [phone]    Current Level of Care: Hospital Recommended Level of Care: [place] Prior Approval Number:    Date Approved/Denied:   PASRR Number:    Discharge Plan: SNF    Current Diagnoses: Patient Active Problem List   Diagnosis Date Noted  . Lobar pneumonia ([place]) 06/23/2020  . Chronic diastolic CHF (congestive heart failure) ([place])   . Sepsis ([place])   . Goals of care, counseling/discussion   . Palliative care by specialist   . Acute pancreatitis   . Bradycardia   . Lactic acidosis   . Sepsis associated hypotension ([place]) 11/28/2019  . Acute diastolic CHF (congestive heart failure) ([place]) 11/21/2019  . Pulmonary vascular congestion 11/20/2019  . Choledocholithiasis   . Chronic dementia without behavioral disturbance ([place]) 11/19/2019  . Accident due to mechanical fall without injury 11/19/2019  . Hyperglycemia due to type 2 diabetes mellitus ([place]) 11/19/2019  . Leukocytosis 11/19/2019  . Acute kidney injury superimposed on CKD ([place]) 11/19/2019  . Troponin I above reference range 11/19/2019  . COPD with acute exacerbation ([place]) 11/19/2019  . Severe sepsis ([place]) 11/19/2019  . Elevated liver enzymes 11/18/2019  . Cholelithiasis 11/18/2019  . Pressure injury of skin 12/22/2017  . Back pain 12/21/2017  . History of diverticulitis 05/02/2016  . Difficulty walking 02/04/2016  . Tremor 02/04/2016  . Weakness 02/04/2016  . Disease of thyroid gland 07/10/2015  . BP (high blood  pressure) 07/10/2015  . HLD (hyperlipidemia) 07/10/2015  . BPH (benign prostatic hyperplasia) 08/02/2014    Orientation RESPIRATION BLADDER Height & Weight     Self  O2 ([place]) Incontinent Weight: 81.6 kg Height:  5\' 9"  (175.3 cm)  BEHAVIORAL SYMPTOMS/MOOD NEUROLOGICAL BOWEL NUTRITION STATUS      Incontinent Diet (heart healthy)  AMBULATORY STATUS COMMUNICATION OF NEEDS Skin   Total Care Verbally Normal                       Personal Care Assistance Level of Assistance  Total care       Total Care Assistance: Maximum assistance   Functional Limitations Info  Sight, Hearing Sight Info: Impaired Hearing Info: Impaired      SPECIAL CARE FACTORS FREQUENCY                       Contractures Contractures Info: Not present    Additional Factors Info  Code Status, Allergies Code Status Info: DNR Allergies Info: Adhesive, augmentin           Current Medications (06/26/2020):  This is the current hospital active medication list Current Facility-Administered Medications  Medication Dose Route Frequency Provider Last Rate Last Admin  . acetaminophen (TYLENOL) suppository 650 mg  650 mg Rectal Q6H PRN Elwyn Reach, MD   650 mg at 06/23/20 2243  . benzonatate (TESSALON) capsule 200 mg  200 mg Oral TID PRN Wyvonnia Dusky, MD   200 mg at 06/26/20 1110  . donepezil (ARICEPT) tablet 5 mg  5 mg Oral QHS  Wyvonnia Dusky, MD      . HYDROmorphone (DILAUDID) injection 0.5 mg  0.5 mg Intravenous Q2H PRN Wyvonnia Dusky, MD      . insulin aspart (novoLOG) injection 0-6 Units  0-6 Units Subcutaneous TID WC Wyvonnia Dusky, MD      . ipratropium-albuterol (DUONEB) 0.5-2.5 (3) MG/3ML nebulizer solution 3 mL  3 mL Nebulization Q6H Wyvonnia Dusky, MD   3 mL at 06/26/20 1436  . levofloxacin (LEVAQUIN) IVPB 750 mg  750 mg Intravenous Q48H Wyvonnia Dusky, MD 100 mL/hr at 06/26/20 1225 Rate Verify at 06/26/20 1225  . [START ON 06/27/2020] levothyroxine  (SYNTHROID) tablet 112 mcg  112 mcg Oral Q0600 Wyvonnia Dusky, MD      . methylPREDNISolone sodium succinate (SOLU-MEDROL) 40 mg/mL injection 40 mg  40 mg Intravenous Q12H Wyvonnia Dusky, MD   40 mg at 06/26/20 0935  . morphine 2 MG/ML injection 1 mg  1 mg Intravenous Q2H PRN Wyvonnia Dusky, MD      . ondansetron [facility]) tablet 4 mg  4 mg Oral Q6H PRN Elwyn Reach, MD       Or  . ondansetron (ZOFRAN) injection 4 mg  4 mg Intravenous Q6H PRN Elwyn Reach, MD   4 mg at 06/23/20 2107  . [START ON 06/27/2020] tamsulosin (FLOMAX) capsule 0.4 mg  0.4 mg Oral Daily Wyvonnia Dusky, MD         Discharge Medications: Please see discharge summary for a list of discharge medications.  Relevant Imaging Results:  Relevant Lab Results:   Additional Information SSN [id number]  Shelbie Hutching, RN

## 2020-06-26 NOTE — Plan of Care (Signed)
  Problem: Nutrition: Goal: Adequate nutrition will be maintained Outcome: Progressing   Problem: Coping: Goal: Level of anxiety will decrease Outcome: Progressing   Problem: Pain Managment: Goal: General experience of comfort will improve Outcome: Progressing   Problem: Safety: Goal: Ability to remain free from injury will improve Outcome: Progressing   Problem: Skin Integrity: Goal: Risk for impaired skin integrity will decrease Outcome: Progressing   Problem: Education: Goal: Knowledge of the prescribed therapeutic regimen will improve Outcome: Progressing   Problem: Clinical Measurements: Goal: Quality of life will improve Outcome: Progressing   Problem: Respiratory: Goal: Verbalizations of increased ease of respirations will increase Outcome: Progressing   Problem: Role Relationship: Goal: Family's ability to cope with current situation will improve Outcome: Progressing Goal: Ability to verbalize concerns, feelings, and thoughts to partner or family member will improve Outcome: Progressing   Problem: Pain Management: Goal: Satisfaction with pain management regimen will improve Outcome: Progressing

## 2020-06-26 NOTE — Care Management Important Message (Signed)
Important Message  Patient Details  Name: David Morgan MRN: [id number] Date of Birth: 07-Mar-1925   Medicare Important Message Given:  Other (see comment)  Patient is on [place] and out of respect for the patient and family no Important Message from [facility] given.  [name] 06/26/2020, 11:03 AM

## 2020-06-26 NOTE — Progress Notes (Addendum)
[place] Room [place] [facility]) Hospital Liaison RN note:  Spoke with spouse, [name], in the room. She has decided that she would like to treat David Morgan with antibiotics and shares that he seems to be feeling some better and eating a little food. She requests hospice services to follow at [place] once he is discharged. Information sent to referral. Chart under review by [facility] physician and eligibility has been approved. Doran Clay, TOC is aware. Plan is for a few day of antibiotics before discharge.  Please call with any hospice related questions or concerns.  Thank you.  Zandra Abts, RN [facility] Liaison [phone]

## 2020-06-26 NOTE — TOC Progression Note (Signed)
Transition of Care [facility]) - Progression Note    Patient Details  Name: David Morgan MRN: [id number] Date of Birth: 09/11/1924  Transition of Care [facility]) CM/SW Contact  Shelbie Hutching, RN Phone Number: 06/26/2020, 2:45 PM  Clinical Narrative:     Patient will stay in the hospital to be treated for pneumonia and then plan to discharge to [facility] with [facility] once medically stable.    Expected Discharge Plan: Hospice Medical Facility Barriers to Discharge: Other (comment)  Expected Discharge Plan and Services Expected Discharge Plan: [place] In-house Referral: Hospice / Palliative Care Discharge Planning Services: CM Consult Post Acute Care Choice: Hospice Living arrangements for the past 2 months: [place]                   DME Agency: NA       HH Arranged: NA           Social Determinants of Health (SDOH) Interventions    Readmission Risk Interventions Readmission Risk Prevention Plan 06/25/2020  Transportation Screening Not Complete  Transportation Screening Comment comfort care/ Hospice  PCP or Specialist Appt within 3-5 Days Not Complete  Not Complete comments hospice  HRI or [place] Not Complete  HRI or Home Care Consult comments hospice  Social Work Consult for [place] Planning/Counseling Complete  Palliative Care Screening Complete  Medication Review Press photographer) Complete  Some recent data might be hidden

## 2020-06-26 NOTE — Progress Notes (Signed)
PROGRESS NOTE    David Morgan  TGG:[id number] DOB: [birth year] DOA: 06/23/2020 PCP: Juluis Pitch, MD    Assessment & Plan:   Principal Problem:   Severe sepsis ([place]) Active Problems:   BP (high blood pressure)   HLD (hyperlipidemia)   BPH (benign prostatic hyperplasia)   Chronic dementia without behavioral disturbance (HCC)   Hyperglycemia due to type 2 diabetes mellitus ([place])   COPD with acute exacerbation (HCC)   Acute diastolic CHF (congestive heart failure) (HCC)   Lobar pneumonia (HCC)  Severe sepsis: meets criteria w/ fever, elevated WBC, tachypnea, tachycardia, altered mental status & multifocal pneumonia.   Multifocal pneumonia: restarted abxs as per request of pt's wife. Continue on IV levaquin, IV solumedrol & bronchodilators. Tessalon pearls prn   Dementia: Continue on home dose of donepezil. Continue w/ supportive care.    HTN: WNL currently  DM2: will restart SSI w/ accuchecks  HLD: not on a statin as per med rec  Hypothyroidism: restart home levothyroxine        DVT prophylaxis: SCDs Code Status: DNR Family Communication: discussed pt's care w/ pt's wife at bedside and answered all of her questions  Disposition Plan: likely d/c back to [facility]  Status is: Inpatient  Remains inpatient appropriate because:Hemodynamically unstable, Unsafe d/c plan and Inpatient level of care appropriate due to severity of illness   Dispo: The patient is from: Home              Anticipated d/c is to:  Unknown               Anticipated d/c date is: > 3 days              Patient currently is not medically stable to d/c.     Consultants:  Hospice   Procedures:   Antimicrobials: levaquin   Subjective: Pt c/o fatigue   Objective: Vitals:   06/24/20 1732 06/25/20 0459 06/25/20 0846 06/26/20 0517  BP: 119/62 (!) 114/102  (!) 149/86  Pulse: 89 (!) 44  82  Resp: 18 16  20   Temp: 98.7 F (37.1 C) 98.6 F (37 C)  98.9 F (37.2 C)  TempSrc:  Axillary Oral    SpO2: 95% 98% 97% 98%  Weight:      Height:        Intake/Output Summary (Last 24 hours) at 06/26/2020 0711 Last data filed at 06/26/2020 0555 Gross per 24 hour  Intake 480 ml  Output 950 ml  Net -470 ml   Filed Weights   06/23/20 1720  Weight: 81.6 kg    Examination:  General exam: Appear calm & comfortable  Respiratory system: course breath sounds b/l Cardiovascular system: S1/S2+. No rubs or gallops Gastrointestinal system: Abdomen is nondistended, soft and nontender. hypoactive bowel sounds heard. Central nervous system: Awake and alert. Moves all 4 extremities  Psychiatry: Judgement and insight appear abnormal. Flat mood and affect     Data Reviewed: I have personally reviewed following labs and imaging studies  CBC: Recent Labs  Lab 06/23/20 1731 06/23/20 2057 06/24/20 0524  WBC 15.9* 14.4* 19.8*  NEUTROABS 13.3*  --   --   HGB 15.9 16.3 14.0  HCT 51.3 52.9* 44.2  MCV 89.5 90.7 88.9  PLT 187 186 703   Basic Metabolic Panel: Recent Labs  Lab 06/23/20 1731 06/23/20 2057 06/24/20 0524  NA 140  --  141  K 3.9  --  4.2  CL 101  --  108  CO2 26  --  24  GLUCOSE 161*  --  196*  BUN 18  --  28*  CREATININE 0.79 1.06 1.61*  CALCIUM 9.2  --  8.5*   GFR: Estimated Creatinine Clearance: 28.7 mL/min (A) (by C-G formula based on SCr of 1.61 mg/dL (H)). Liver Function Tests: Recent Labs  Lab 06/23/20 1731 06/24/20 0524  AST 21 23  ALT 13 12  ALKPHOS 94 67  BILITOT 1.3* 1.1  PROT 8.5* 6.4*  ALBUMIN 3.9 2.8*   No results for input(s): LIPASE, AMYLASE in the last 168 hours. No results for input(s): AMMONIA in the last 168 hours. Coagulation Profile: Recent Labs  Lab 06/23/20 1731 06/24/20 0524  INR 1.1 1.2   Cardiac Enzymes: No results for input(s): CKTOTAL, CKMB, CKMBINDEX, TROPONINI in the last 168 hours. BNP (last 3 results) No results for input(s): PROBNP in the last 8760 hours. HbA1C: No results for input(s): HGBA1C in the  last 72 hours. CBG: No results for input(s): GLUCAP in the last 168 hours. Lipid Profile: No results for input(s): CHOL, HDL, LDLCALC, TRIG, CHOLHDL, LDLDIRECT in the last 72 hours. Thyroid Function Tests: No results for input(s): TSH, T4TOTAL, FREET4, T3FREE, THYROIDAB in the last 72 hours. Anemia Panel: No results for input(s): VITAMINB12, FOLATE, FERRITIN, TIBC, IRON, RETICCTPCT in the last 72 hours. Sepsis Labs: Recent Labs  Lab 06/23/20 1731 06/23/20 1936 06/24/20 0524  PROCALCITON  --   --  27.86  LATICACIDVEN 2.4* 1.5  --     Recent Results (from the past 240 hour(s))  Culture, blood (Routine x 2)     Status: None (Preliminary result)   Collection Time: 06/23/20  5:31 PM   Specimen: BLOOD  Result Value Ref Range Status   Specimen Description BLOOD RIGHT WRIST  Final   Special Requests   Final    BOTTLES DRAWN AEROBIC AND ANAEROBIC Blood Culture adequate volume   Culture   Final    NO GROWTH 3 DAYS Performed at [facility], [address], [place], [place] [address]    Report Status PENDING  Incomplete  Urine culture     Status: Abnormal   Collection Time: 06/23/20  7:09 PM   Specimen: Urine, Random  Result Value Ref Range Status   Specimen Description   Final    URINE, RANDOM Performed at [facility], [address], [place], [place] [address]    Special Requests   Final    NONE Performed at [facility], [address], [place], [place] [address]    Culture (A)  Final    <10,000 COLONIES/mL INSIGNIFICANT GROWTH Performed at [facility], [place] [address] [place], [place] [address]    Report Status 06/25/2020 FINAL  Final  Culture, blood (Routine x 2)     Status: None (Preliminary result)   Collection Time: 06/23/20  7:36 PM   Specimen: BLOOD  Result Value Ref Range Status   Specimen Description BLOOD BLOOD LEFT FOREARM  Final   Special Requests   Final    BOTTLES DRAWN AEROBIC AND ANAEROBIC Blood Culture results may  not be optimal due to an excessive volume of blood received in culture bottles   Culture   Final    NO GROWTH 3 DAYS Performed at [facility], [place]., [place],  [address]    Report Status PENDING  Incomplete  Respiratory Panel by RT PCR (Flu A&B, Covid) - Nasopharyngeal Swab     Status: None   Collection Time: 06/23/20  7:54  PM   Specimen: Nasopharyngeal Swab  Result Value Ref Range Status   SARS Coronavirus 2 by RT PCR NEGATIVE NEGATIVE Final    Comment: (NOTE) SARS-CoV-2 target nucleic acids are NOT DETECTED.  The SARS-CoV-2 RNA is generally detectable in upper respiratoy specimens during the acute phase of infection. The lowest concentration of SARS-CoV-2 viral copies this assay can detect is 131 copies/mL. A negative result does not preclude SARS-Cov-2 infection and should not be used as the sole basis for treatment or other patient management decisions. A negative result may occur with  improper specimen collection/handling, submission of specimen other than nasopharyngeal swab, presence of viral mutation(s) within the areas targeted by this assay, and inadequate number of viral copies (<131 copies/mL). A negative result must be combined with clinical observations, patient history, and epidemiological information. The expected result is Negative.  Fact Sheet for Patients:  PinkCheek.be  Fact Sheet for Healthcare Providers:  GravelBags.it  This test is no t yet approved or cleared by the [place] FDA and  has been authorized for detection and/or diagnosis of SARS-CoV-2 by FDA under an Emergency Use Authorization (EUA). This EUA will remain  in effect (meaning this test can be used) for the duration of the COVID-19 declaration under Section 564(b)(1) of the Act, 21 U.S.C. section 360bbb-3(b)(1), unless the authorization is terminated or revoked sooner.     Influenza A by PCR NEGATIVE  NEGATIVE Final   Influenza B by PCR NEGATIVE NEGATIVE Final    Comment: (NOTE) The Xpert Xpress SARS-CoV-2/FLU/RSV assay is intended as an aid in  the diagnosis of influenza from Nasopharyngeal swab specimens and  should not be used as a sole basis for treatment. Nasal washings and  aspirates are unacceptable for Xpert Xpress SARS-CoV-2/FLU/RSV  testing.  Fact Sheet for Patients: PinkCheek.be  Fact Sheet for Healthcare Providers: GravelBags.it  This test is not yet approved or cleared by the [place] FDA and  has been authorized for detection and/or diagnosis of SARS-CoV-2 by  FDA under an Emergency Use Authorization (EUA). This EUA will remain  in effect (meaning this test can be used) for the duration of the  Covid-19 declaration under Section 564(b)(1) of the Act, 21  U.S.C. section 360bbb-3(b)(1), unless the authorization is  terminated or revoked. Performed at [facility], [address], [place], [place] [address]          Radiology Studies: No results found.      Scheduled Meds:  Continuous Infusions:    LOS: 3 days    Time spent: 32 mins    Wyvonnia Dusky, MD [facility] Pager [phone]  If 7PM-7AM, please contact night-coverage [email or web address] 06/26/2020, 7:11 AM

## 2020-06-27 DIAGNOSIS — F0390 Unspecified dementia without behavioral disturbance: Secondary | ICD-10-CM

## 2020-06-27 DIAGNOSIS — J441 Chronic obstructive pulmonary disease with (acute) exacerbation: Secondary | ICD-10-CM

## 2020-06-27 LAB — BASIC METABOLIC PANEL
Anion gap: 7 (ref 5–15)
BUN: 22 mg/dL (ref 8–23)
CO2: 26 mmol/L (ref 22–32)
Calcium: 8.7 mg/dL — ABNORMAL LOW (ref 8.9–10.3)
Chloride: 106 mmol/L (ref 98–111)
Creatinine, Ser: 0.54 mg/dL — ABNORMAL LOW (ref 0.61–1.24)
GFR, Estimated: >60 mL/min (ref >60)
Glucose, Bld: 164 mg/dL — ABNORMAL HIGH (ref 70–99)
Potassium: 4.1 mmol/L (ref 3.5–5.1)
Sodium: 139 mmol/L (ref 135–145)

## 2020-06-27 LAB — CBC
HCT: 42.4 % (ref 39.0–52.0)
Hemoglobin: 13.4 g/dL (ref 13.0–17.0)
MCH: 28.1 pg (ref 26.0–34.0)
MCHC: 31.6 g/dL (ref 30.0–36.0)
MCV: 88.9 fL (ref 80.0–100.0)
Platelets: 153 10*3/uL (ref 150–400)
RBC: 4.77 MIL/uL (ref 4.22–5.81)
RDW: 15.3 % (ref 11.5–15.5)
WBC: 5.2 10*3/uL (ref 4.0–10.5)
nRBC: 0 % (ref 0.0–0.2)

## 2020-06-27 LAB — GLUCOSE, CAPILLARY
Glucose-Capillary: 145 mg/dL — ABNORMAL HIGH (ref 70–99)
Glucose-Capillary: 221 mg/dL — ABNORMAL HIGH (ref 70–99)
Glucose-Capillary: 198 mg/dL — ABNORMAL HIGH (ref 70–99)
Glucose-Capillary: 170 mg/dL — ABNORMAL HIGH (ref 70–99)

## 2020-06-27 MED ORDER — LEVOFLOXACIN IN D5W 750 MG/150ML IV SOLN
750.0000 mg | INTRAVENOUS | Status: DC
  Administered 2020-06-27: 750 mg via INTRAVENOUS
  Filled 2020-06-27 (×2): qty 150

## 2020-06-27 MED ORDER — IPRATROPIUM-ALBUTEROL 0.5-2.5 (3) MG/3ML IN SOLN
3.0000 mL | Freq: Four times a day (QID) | RESPIRATORY_TRACT | Status: DC
  Administered 2020-06-27 (×2): 3 mL via RESPIRATORY_TRACT
  Filled 2020-06-27 (×2): qty 3

## 2020-06-27 MED ORDER — IPRATROPIUM-ALBUTEROL 0.5-2.5 (3) MG/3ML IN SOLN: 3.0000 mL | Freq: Four times a day (QID) | RESPIRATORY_TRACT | Status: DC | PRN

## 2020-06-27 MED ORDER — ENOXAPARIN SODIUM 40 MG/0.4ML ~~LOC~~ SOLN
40.0000 mg | SUBCUTANEOUS | Status: DC
  Administered 2020-06-27 – 2020-06-28 (×2): 40 mg via SUBCUTANEOUS
  Filled 2020-06-27 (×2): qty 0.4

## 2020-06-27 MED ORDER — DM-GUAIFENESIN ER 30-600 MG PO TB12
1.0000 | ORAL_TABLET | Freq: Two times a day (BID) | ORAL | Status: DC
  Administered 2020-06-27 – 2020-06-29 (×5): 1 via ORAL
  Filled 2020-06-27 (×5): qty 1

## 2020-06-27 NOTE — Plan of Care (Signed)
  Problem: Nutrition: Goal: Adequate nutrition will be maintained Outcome: Progressing   Problem: Coping: Goal: Level of anxiety will decrease Outcome: Progressing   Problem: Pain Managment: Goal: General experience of comfort will improve Outcome: Progressing   Problem: Safety: Goal: Ability to remain free from injury will improve Outcome: Progressing   Problem: Skin Integrity: Goal: Risk for impaired skin integrity will decrease Outcome: Progressing   Problem: Education: Goal: Knowledge of the prescribed therapeutic regimen will improve Outcome: Progressing   Problem: Clinical Measurements: Goal: Quality of life will improve Outcome: Progressing   Problem: Respiratory: Goal: Verbalizations of increased ease of respirations will increase Outcome: Progressing   Problem: Role Relationship: Goal: Family's ability to cope with current situation will improve Outcome: Progressing Goal: Ability to verbalize concerns, feelings, and thoughts to partner or family member will improve Outcome: Progressing   Problem: Pain Management: Goal: Satisfaction with pain management regimen will improve Outcome: Progressing

## 2020-06-27 NOTE — Progress Notes (Signed)
PHARMACY NOTE:  ANTIMICROBIAL RENAL DOSAGE ADJUSTMENT  Current antimicrobial regimen includes a mismatch between antimicrobial dosage and estimated renal function.  As per policy approved by the Pharmacy & Therapeutics and Medical Executive Committees, the antimicrobial dosage will be adjusted accordingly.  Current antimicrobial dosage:  Levaquin 750 mg IV q48h  Indication: Pneumonia  Renal Function: Estimated Creatinine Clearance: 57.7 mL/min (A) (by C-G formula based on SCr of 0.54 mg/dL (L)).     Antimicrobial dosage has been changed to:   Levaquin 750 mg IV q24h for Crcl > 50 ml/min  Additional comments:   Thank you for allowing pharmacy to be a part of this patient's care.  Chinita Greenland PharmD Clinical Pharmacist 06/27/2020

## 2020-06-27 NOTE — Progress Notes (Signed)
PROGRESS NOTE    David Morgan  RCB:[id number] DOB: [birth year] DOA: 06/23/2020 PCP: Juluis Pitch, MD   HPI David Morgan is a 86 y.o. male with medical history significant of bladder cancer, history of CVA, hypertension, hypothyroidism, hyperlipidemia, peripheral vascular disease, COPD and dementia who has basal confusion and lives with his wife.  He was brought in by EMS due to worsening shortness of breath, altered mental status. Work-up showed bilateral pneumonia, COVID-19 negative. Patient met sepsis criteria with evidence of multifocal pneumonia. In the ED, temperature is 102.7 with blood pressure initially 174/86 dropping to 69/53 pulse 147 respirate 45 oxygen sat 86% on 4 L.  White count 14.4 hemoglobin 16.3 and platelets 186 chemistry largely within normal except for glucose 161.  Initial lactic acid 2.4, urinalysis so far negative.  Chest x-ray showed asymmetric patchy airspace disease mainly the right lung and to a lesser extent left lung base suspicious for multifocal pneumonia. Patient admitted to the hospital for treatment.  Patient is a DNR and initially placed on comfort care, she was later discontinued as patient noted to be improving somewhat.  Wife wanted patient to be placed on antibiotics and treated for pneumonia.    Assessment & Plan:   Principal Problem:   Severe sepsis ([place]) Active Problems:   BP (high blood pressure)   HLD (hyperlipidemia)   BPH (benign prostatic hyperplasia)   Chronic dementia without behavioral disturbance (HCC)   Hyperglycemia due to type 2 diabetes mellitus (HCC)   COPD with acute exacerbation (HCC)   Acute diastolic CHF (congestive heart failure) (HCC)   Lobar pneumonia (HCC)   Severe sepsis likely 2/2 multifocal pneumonia On admission, fever, elevated WBC, tachypnea, tachycardia, altered mental status & multifocal pneumonia Currently afebrile, no leukocytosis Started on IV Levaquin, will continue Continue IV Solu-Medrol,  bronchodilators, Mucinex scheduled, Tessalon Perles as needed  Acute on chronic hypoxic respiratory failure Currently back to baseline with 2 L of O2 Bronchodilators, supplemental oxygen as needed  Hypertension BP stable  Diabetes mellitus type 2 Continue SSI, Accu-Cheks, hypoglycemic protocol  Hypothyroidism Continue levothyroxine  Dementia Continue on home dose of donepezil      DVT prophylaxis: Lovenox Code Status: DNR Family Communication: Plan to discuss with wife  Disposition Plan: likely d/c back to [facility]  Status is: Inpatient  Remains inpatient appropriate because:Hemodynamically unstable, Unsafe d/c plan and Inpatient level of care appropriate due to severity of illness   Dispo: The patient is from: Long-term SNF ([facility])              Anticipated d/c is to:  Unknown               Anticipated d/c date is: 2 days              Patient currently is not medically stable to d/c.   Consultants:  Hospice   Procedures:   Antimicrobials: levaquin   Subjective: Today, patient noted to be coughing, denied any worsening shortness of breath, chest pain, abdominal pain, nausea/vomiting, fever/chills.  Overall appears very deconditioned   Objective: Vitals:   06/27/20 0910 06/27/20 1225 06/27/20 1338 06/27/20 1637  BP: (!) 119/59 119/64  126/67  Pulse: (!) 55 (!) 57  60  Resp: 20 (!) 21  18  Temp: (!) 97.1 F (36.2 C) 98.2 F (36.8 C)  97.7 F (36.5 C)  TempSrc: Axillary Axillary    SpO2: 96% 95% 97% 98%  Weight:      Height:  Intake/Output Summary (Last 24 hours) at 06/27/2020 1639 Last data filed at 06/27/2020 1500 Gross per 24 hour  Intake 720 ml  Output 1400 ml  Net -680 ml   Filed Weights   06/23/20 1720  Weight: 81.6 kg    Examination:  General: NAD, chronically ill-appearing, awake, not oriented  Cardiovascular: S1, S2 present  Respiratory:  Coarse breath sounds bilaterally  Abdomen: Soft, nontender, nondistended, bowel sounds  present  Musculoskeletal: No bilateral pedal edema noted  Skin: Normal  Psychiatry: Normal mood, judgment and insight appear normal    Data Reviewed: I have personally reviewed following labs and imaging studies  CBC: Recent Labs  Lab 06/23/20 1731 06/23/20 2057 06/24/20 0524 06/27/20 0559  WBC 15.9* 14.4* 19.8* 5.2  NEUTROABS 13.3*  --   --   --   HGB 15.9 16.3 14.0 13.4  HCT 51.3 52.9* 44.2 42.4  MCV 89.5 90.7 88.9 88.9  PLT 187 186 155 161   Basic Metabolic Panel: Recent Labs  Lab 06/23/20 1731 06/23/20 2057 06/24/20 0524 06/27/20 0559  NA 140  --  141 139  K 3.9  --  4.2 4.1  CL 101  --  108 106  CO2 26  --  24 26  GLUCOSE 161*  --  196* 164*  BUN 18  --  28* 22  CREATININE 0.79 1.06 1.61* 0.54*  CALCIUM 9.2  --  8.5* 8.7*   GFR: Estimated Creatinine Clearance: 57.7 mL/min (A) (by C-G formula based on SCr of 0.54 mg/dL (L)). Liver Function Tests: Recent Labs  Lab 06/23/20 1731 06/24/20 0524  AST 21 23  ALT 13 12  ALKPHOS 94 67  BILITOT 1.3* 1.1  PROT 8.5* 6.4*  ALBUMIN 3.9 2.8*   No results for input(s): LIPASE, AMYLASE in the last 168 hours. No results for input(s): AMMONIA in the last 168 hours. Coagulation Profile: Recent Labs  Lab 06/23/20 1731 06/24/20 0524  INR 1.1 1.2   Cardiac Enzymes: No results for input(s): CKTOTAL, CKMB, CKMBINDEX, TROPONINI in the last 168 hours. BNP (last 3 results) No results for input(s): PROBNP in the last 8760 hours. HbA1C: No results for input(s): HGBA1C in the last 72 hours. CBG: Recent Labs  Lab 06/26/20 1618 06/26/20 2252 06/27/20 0909 06/27/20 1223  GLUCAP 172* 97 145* 221*   Lipid Profile: No results for input(s): CHOL, HDL, LDLCALC, TRIG, CHOLHDL, LDLDIRECT in the last 72 hours. Thyroid Function Tests: No results for input(s): TSH, T4TOTAL, FREET4, T3FREE, THYROIDAB in the last 72 hours. Anemia Panel: No results for input(s): VITAMINB12, FOLATE, FERRITIN, TIBC, IRON, RETICCTPCT in the  last 72 hours. Sepsis Labs: Recent Labs  Lab 06/23/20 1731 06/23/20 1936 06/24/20 0524  PROCALCITON  --   --  27.86  LATICACIDVEN 2.4* 1.5  --     Recent Results (from the past 240 hour(s))  Culture, blood (Routine x 2)     Status: None (Preliminary result)   Collection Time: 06/23/20  5:31 PM   Specimen: BLOOD  Result Value Ref Range Status   Specimen Description BLOOD RIGHT WRIST  Final   Special Requests   Final    BOTTLES DRAWN AEROBIC AND ANAEROBIC Blood Culture adequate volume   Culture   Final    NO GROWTH 4 DAYS Performed at [facility], [address], [place], [place] [address]    Report Status PENDING  Incomplete  Urine culture     Status: Abnormal   Collection Time: 06/23/20  7:09 PM   Specimen:  Urine, Random  Result Value Ref Range Status   Specimen Description   Final    URINE, RANDOM Performed at [facility], [address] [place], [place] [address]    Special Requests   Final    NONE Performed at [facility], [address] [place], [place] [address]    Culture (A)  Final    <10,000 COLONIES/mL INSIGNIFICANT GROWTH Performed at [facility] [address], [place], [place] [address]    Report Status 06/25/2020 FINAL  Final  Culture, blood (Routine x 2)     Status: None (Preliminary result)   Collection Time: 06/23/20  7:36 PM   Specimen: BLOOD  Result Value Ref Range Status   Specimen Description BLOOD BLOOD LEFT FOREARM  Final   Special Requests   Final    BOTTLES DRAWN AEROBIC AND ANAEROBIC Blood Culture results may not be optimal due to an excessive volume of blood received in culture bottles   Culture   Final    NO GROWTH 4 DAYS Performed at [facility], [address], [place], [place] [address]    Report Status PENDING  Incomplete  Respiratory Panel by RT PCR (Flu A&B, Covid) - Nasopharyngeal Swab     Status: None   Collection Time: 06/23/20  7:54 PM   Specimen: Nasopharyngeal Swab    Result Value Ref Range Status   SARS Coronavirus 2 by RT PCR NEGATIVE NEGATIVE Final    Comment: (NOTE) SARS-CoV-2 target nucleic acids are NOT DETECTED.  The SARS-CoV-2 RNA is generally detectable in upper respiratoy specimens during the acute phase of infection. The lowest concentration of SARS-CoV-2 viral copies this assay can detect is 131 copies/mL. A negative result does not preclude SARS-Cov-2 infection and should not be used as the sole basis for treatment or other patient management decisions. A negative result may occur with  improper specimen collection/handling, submission of specimen other than nasopharyngeal swab, presence of viral mutation(s) within the areas targeted by this assay, and inadequate number of viral copies (<131 copies/mL). A negative result must be combined with clinical observations, patient history, and epidemiological information. The expected result is Negative.  Fact Sheet for Patients:  PinkCheek.be  Fact Sheet for Healthcare Providers:  GravelBags.it  This test is no t yet approved or cleared by the [place] FDA and  has been authorized for detection and/or diagnosis of SARS-CoV-2 by FDA under an Emergency Use Authorization (EUA). This EUA will remain  in effect (meaning this test can be used) for the duration of the COVID-19 declaration under Section 564(b)(1) of the Act, 21 U.S.C. section 360bbb-3(b)(1), unless the authorization is terminated or revoked sooner.     Influenza A by PCR NEGATIVE NEGATIVE Final   Influenza B by PCR NEGATIVE NEGATIVE Final    Comment: (NOTE) The Xpert Xpress SARS-CoV-2/FLU/RSV assay is intended as an aid in  the diagnosis of influenza from Nasopharyngeal swab specimens and  should not be used as a sole basis for treatment. Nasal washings and  aspirates are unacceptable for Xpert Xpress SARS-CoV-2/FLU/RSV  testing.  Fact Sheet for  Patients: PinkCheek.be  Fact Sheet for Healthcare Providers: GravelBags.it  This test is not yet approved or cleared by the [place] FDA and  has been authorized for detection and/or diagnosis of SARS-CoV-2 by  FDA under an Emergency Use Authorization (EUA). This EUA will remain  in effect (meaning this test can be used) for the duration of the  Covid-19 declaration under Section  564(b)(1) of the Act, 21  U.S.C. section 360bbb-3(b)(1), unless the authorization is  terminated or revoked. Performed at [facility], [address], [place], [place] [address]          Radiology Studies: No results found.      Scheduled Meds: . dextromethorphan-guaiFENesin  1 tablet Oral BID  . donepezil  5 mg Oral QHS  . insulin aspart  0-6 Units Subcutaneous TID WC  . ipratropium-albuterol  3 mL Nebulization Q6H  . levothyroxine  112 mcg Oral Q0600  . methylPREDNISolone (SOLU-MEDROL) injection  40 mg Intravenous Q12H  . tamsulosin  0.4 mg Oral Daily   Continuous Infusions: . levofloxacin (LEVAQUIN) IV 750 mg (06/27/20 1218)     LOS: 4 days    Alma Friendly, MD [facility]   If 7PM-7AM, please contact night-coverage [email or web address] 06/27/2020, 4:39 PM

## 2020-06-28 LAB — BASIC METABOLIC PANEL
Anion gap: 9 (ref 5–15)
BUN: 27 mg/dL — ABNORMAL HIGH (ref 8–23)
CO2: 24 mmol/L (ref 22–32)
Calcium: 8.8 mg/dL — ABNORMAL LOW (ref 8.9–10.3)
Chloride: 106 mmol/L (ref 98–111)
Creatinine, Ser: 0.84 mg/dL (ref 0.61–1.24)
GFR, Estimated: >60 mL/min (ref >60)
Glucose, Bld: 185 mg/dL — ABNORMAL HIGH (ref 70–99)
Potassium: 4.1 mmol/L (ref 3.5–5.1)
Sodium: 139 mmol/L (ref 135–145)

## 2020-06-28 LAB — GLUCOSE, CAPILLARY
Glucose-Capillary: 160 mg/dL — ABNORMAL HIGH (ref 70–99)
Glucose-Capillary: 237 mg/dL — ABNORMAL HIGH (ref 70–99)
Glucose-Capillary: 160 mg/dL — ABNORMAL HIGH (ref 70–99)
Glucose-Capillary: 178 mg/dL — ABNORMAL HIGH (ref 70–99)

## 2020-06-28 LAB — CULTURE, BLOOD (ROUTINE X 2)
Culture: NO GROWTH
Culture: NO GROWTH
Special Requests: ADEQUATE

## 2020-06-28 MED ORDER — LEVOFLOXACIN 750 MG PO TABS
750.0000 mg | ORAL_TABLET | Freq: Every day | ORAL | Status: DC
  Administered 2020-06-28 – 2020-06-29 (×2): 750 mg via ORAL
  Filled 2020-06-28 (×2): qty 1

## 2020-06-28 MED ORDER — METHYLPREDNISOLONE SODIUM SUCC 40 MG IJ SOLR
40.0000 mg | Freq: Every day | INTRAMUSCULAR | Status: DC
  Administered 2020-06-28: 09:00:00 40 mg via INTRAVENOUS
  Filled 2020-06-28: qty 1

## 2020-06-28 MED ORDER — IPRATROPIUM-ALBUTEROL 0.5-2.5 (3) MG/3ML IN SOLN
3.0000 mL | Freq: Two times a day (BID) | RESPIRATORY_TRACT | Status: DC
  Administered 2020-06-28 – 2020-06-29 (×3): 3 mL via RESPIRATORY_TRACT
  Filled 2020-06-28 (×3): qty 3

## 2020-06-28 MED ORDER — PREDNISONE 20 MG PO TABS
40.0000 mg | ORAL_TABLET | Freq: Every day | ORAL | Status: DC
  Administered 2020-06-29: 40 mg via ORAL
  Filled 2020-06-28: qty 2

## 2020-06-28 MED ORDER — ACETAMINOPHEN 325 MG PO TABS
650.0000 mg | ORAL_TABLET | Freq: Four times a day (QID) | ORAL | Status: DC | PRN
  Administered 2020-06-28: 650 mg via ORAL
  Filled 2020-06-28: qty 2

## 2020-06-28 NOTE — Evaluation (Signed)
Occupational Therapy Evaluation Patient Details Name: David Morgan MRN: [id number] DOB: 1925-06-09 Today's Date: 06/28/2020    History of Present Illness 86 y.o. male with medical history significant of bladder cancer, history of CVA, hypertension, hypothyroidism, hyperlipidemia, peripheral vascular disease, COPD and dementia who has basal confusion and lives with his wife.  He was brought in by EMS due to worsening shortness of breath, altered mental status. Work-up showed bilateral pneumonia, COVID-19 negative. Patient met sepsis criteria with evidence of multifocal pneumonia. In the ED, temperature is 102.7 with blood pressure initially 174/86 dropping to 69/53 pulse 147 respirate 45 oxygen sat 86% on 4 L.  White count 14.4 hemoglobin 16.3 and platelets 186 chemistry largely within normal except for glucose 161.  Initial lactic acid 2.4, urinalysis so far negative.  Chest x-ray showed asymmetric patchy airspace disease mainly the right lung and to a lesser extent left lung base suspicious for multifocal pneumonia. Patient admitted to the hospital for treatment.  Patient is a DNR and initially placed on comfort care, she was later discontinued as patient noted to be improving somewhat.  Wife wanted patient to be placed on antibiotics and treated for pneumonia.   Clinical Impression   Patient presenting with decreased I in self care, balance, functional mobility/transfers, endurance, and safety awareness. Pt's wife present in room who reports pt is long term resident at [facility]. He is assisted with self care tasks from bed level, was feeding self, and was transferring into wheelchair and able to proper with B UEs and LEs. Pt is very HOH and did well with gestures and demonstrational cuing. Patient currently performing grooming tasks with min A to initiate task and then able to complete on his own, min - mod A for bed mobility, and max A to stand on EOB for ~ 20 seconds. Pt very fearful and returning  to bed at that time. Caregiver reports he is close to baseline but is weaker from being in hospital and would like to work on strengthening in order to transfer and feed self. Pt may benefit from AE for self feeding.  Patient will benefit from acute OT to increase overall independence in the areas of ADLs, functional mobility, and safety awareness in order to safely discharge to next venue of care.    Follow Up Recommendations  SNF    Equipment Recommendations  Other (comment) (none at this time)       Precautions / Restrictions Precautions Precautions: Fall      Mobility Bed Mobility Overal bed mobility: Needs Assistance Bed Mobility: Supine to Sit;Sit to Supine     Supine to sit: Min assist Sit to supine: Mod assist   General bed mobility comments: min A for trunk support from flat bed and mod A for B LEs sit >supine    Transfers Overall transfer level: Needs assistance Equipment used: None Transfers: Sit to/from Stand Sit to Stand: Max assist         General transfer comment: max lifting assistance with therapist in front and pt standing for ~ 20 seconds at then sitting back down secondary to fear    Balance Overall balance assessment: Needs assistance Sitting-balance support: Feet supported Sitting balance-Leahy Scale: Fair       Standing balance-Leahy Scale: Poor         ADL either performed or assessed with clinical judgement   ADL Overall ADL's : Needs assistance/impaired Eating/Feeding: Minimal assistance   Grooming: Wash/dry hands;Wash/dry face;Minimal assistance;Bed level;Cueing for safety;Cueing for sequencing  General ADL Comments: Caregiver unsure how much assistance he needed with self care at [place]. Pt able to wash face and hands with min multimodal cuing to initiate task and gestures for hand washing. Pt is having difficulty with feeding self secondary to B hand tremor and may benefit from AE.     Vision Patient Visual Report: No change from  baseline              Pertinent Vitals/Pain Pain Assessment: Faces Faces Pain Scale: No hurt     Hand Dominance Right   Extremity/Trunk Assessment Upper Extremity Assessment Upper Extremity Assessment: Generalized weakness   Lower Extremity Assessment Lower Extremity Assessment: Defer to PT evaluation       Communication Communication Communication: HOH   Cognition Arousal/Alertness: Awake/alert Behavior During Therapy: Flat affect Overall Cognitive Status: History of cognitive impairments - at baseline      General Comments: Pt oriented to self and sometimes believes wife to be his mother. Pt very pleasant and cooperative. Did well with gestures vs verbal directions secondary to [place] expects to be discharged to:: Skilled nursing facility            Prior Functioning/Environment Level of Independence: Needs assistance  Gait / Transfers Assistance Needed: Pt's wife reports he transfers into wheelchair or chair and is able to propel himself with LEs and UEs. She is unclear how much assist he needs at baseline to transfer. ADL's / Homemaking Assistance Needed: Pt's wife report staff assist pt with self care task from bed level. He uses briefs and is changed during the day as he is not aware when he has been incontinent. Communication / Swallowing Assistance Needed: HOH Comments: Pt's wife present to provide PLOF and reports he has been at [facility] for ~ 7 months now.        OT Problem List: Decreased strength;Decreased cognition;Decreased activity tolerance;Decreased safety awareness;Decreased knowledge of use of DME or AE;Impaired balance (sitting and/or standing)      OT Treatment/Interventions: Self-care/ADL training;Therapeutic exercise;Therapeutic activities;Energy conservation;DME and/or AE instruction;Patient/family education;Balance training;Cognitive remediation/compensation    OT Goals(Current goals can be found in the  care plan section) Acute Rehab OT Goals Patient Stated Goal: "to be able to feed himself and transfer into chair" OT Goal Formulation: With family Time For Goal Achievement: 07/12/20 Potential to Achieve Goals: Good ADL Goals Pt Will Perform Eating: with set-up;with adaptive utensils Pt Will Perform Grooming: with set-up;sitting Pt Will Perform Upper Body Bathing: with set-up Pt Will Perform Upper Body Dressing: with set-up  OT Frequency: Min 1X/week   Barriers to D/C:    none known at this time          AM-PAC OT "6 Clicks" Daily Activity     Outcome Measure Help from another person eating meals?: A Little Help from another person taking care of personal grooming?: A Little Help from another person toileting, which includes using toliet, bedpan, or urinal?: Total Help from another person bathing (including washing, rinsing, drying)?: A Lot Help from another person to put on and taking off regular upper body clothing?: A Lot Help from another person to put on and taking off regular lower body clothing?: Total 6 Click Score: 12   End of Session    Activity Tolerance: Patient tolerated treatment well Patient left: in bed;with call bell/phone within reach;with bed alarm set;with family/visitor present  OT Visit Diagnosis: Unsteadiness on feet (R26.81);Muscle  weakness (generalized) (M62.81)                Time: 2883-3744 OT Time Calculation (min): 28 min Charges:  OT General Charges $OT Visit: 1 Visit OT Evaluation $OT Eval Low Complexity: 1 Low OT Treatments $Self Care/Home Management : 8-22 mins $Therapeutic Activity: 8-22 mins  Darleen Crocker, MS, OTR/L , CBIS ascom [phone]  06/28/20, 12:59 PM

## 2020-06-28 NOTE — Care Management Important Message (Signed)
Important Message  Patient Details  Name: David Morgan MRN: [id number] Date of Birth: [birth year]   Medicare Important Message Given:  Yes     [name] 06/28/2020, 10:32 AM

## 2020-06-28 NOTE — Plan of Care (Signed)
  Problem: Nutrition: Goal: Adequate nutrition will be maintained Outcome: Progressing   Problem: Coping: Goal: Level of anxiety will decrease Outcome: Progressing   Problem: Pain Managment: Goal: General experience of comfort will improve Outcome: Progressing   Problem: Safety: Goal: Ability to remain free from injury will improve Outcome: Progressing   Problem: Skin Integrity: Goal: Risk for impaired skin integrity will decrease Outcome: Progressing   Problem: Education: Goal: Knowledge of the prescribed therapeutic regimen will improve Outcome: Progressing   Problem: Clinical Measurements: Goal: Quality of life will improve Outcome: Progressing   Problem: Respiratory: Goal: Verbalizations of increased ease of respirations will increase Outcome: Progressing   Problem: Role Relationship: Goal: Family's ability to cope with current situation will improve Outcome: Progressing Goal: Ability to verbalize concerns, feelings, and thoughts to partner or family member will improve Outcome: Progressing   Problem: Pain Management: Goal: Satisfaction with pain management regimen will improve Outcome: Progressing

## 2020-06-28 NOTE — Consult Note (Signed)
PHARMACIST - PHYSICIAN COMMUNICATION DR:   Horris Latino CONCERNING: Antibiotic IV to Oral Route Change Policy  RECOMMENDATION: This patient is receiving levofloxacin by the intravenous route.  Based on criteria approved by the Pharmacy and Therapeutics Committee, the antibiotic(s) is/are being converted to the equivalent oral dose form(s).   DESCRIPTION: These criteria include:  Patient being treated for a respiratory tract infection, urinary tract infection, cellulitis or clostridium difficile associated diarrhea if on metronidazole  The patient is not neutropenic and does not exhibit a GI malabsorption state  The patient is eating (either orally or via tube) and/or has been taking other orally administered medications for a least 24 hours  The patient is improving clinically and has a Tmax < 100.5  If you have questions about this conversion, please contact the [place], PharmD, BCPS Clinical Pharmacist 06/28/2020 10:20 AM

## 2020-06-28 NOTE — Progress Notes (Signed)
[place] [facility] [place] [facility]) Hospital Liaison RN note:  Visited with patient and spouse, [name], in room. Patient is alert and answering some questions appropriately. Spouse states he is doing better and eating a little better.  Levaquin IV has been changed to PO and wife expressed that patient may be going back to [facility] soon and she would rather he not go back over the weekend. She had no question or concerns otherwise and appreciative of visit. OT evaluation today.  Please call with any hospice related questions or concerns.  Zandra Abts, RN [facility] Liaison [phone]

## 2020-06-28 NOTE — Evaluation (Signed)
Physical Therapy Evaluation Patient Details Name: David Morgan MRN: [id number] DOB: 04-21-1925 Today's Date: 06/28/2020   History of Present Illness  Pt is a 86 y.o. male with medical history significant of bladder cancer, history of CVA, hypertension, hypothyroidism, hyperlipidemia, peripheral vascular disease, COPD and dementia.  He was brought in by EMS due to worsening shortness of breath, altered mental status.  MD assessment includes: Severe sepsis likely due to multifocal pneumonia, acute on chronic hypoxic respiratory failure, HTN, DM II, hypothyroidism, and dementia.    Clinical Impression  Pt with h/o dementia and very HOH but was able to follow most 1-step commands with multi-modal cuing and extra time to process.  Pt required significant physical assistance with bed mobility tasks and presented with posterior instability upon coming to sitting at the EOB.  After anterior weight shifting activities in sitting the pt was able to sit improved posture and minimal posterior instability.  Pt required heavy +2 assist to stand from an elevated EOB and was unable to come to full upright position on either of the two standing sessions.  Pt will benefit from PT services in a SNF setting upon discharge to safely address deficits listed in patient problem list for decreased caregiver assistance and eventual return to PLOF.      Follow Up Recommendations SNF;Supervision/Assistance - 24 hour    Equipment Recommendations  None recommended by PT    Recommendations for Other Services       Precautions / Restrictions Precautions Precautions: Fall Restrictions Weight Bearing Restrictions: No      Mobility  Bed Mobility Overal bed mobility: Needs Assistance Bed Mobility: Rolling Rolling: Mod assist   Supine to sit: Mod assist Sit to supine: Mod assist   General bed mobility comments: Mod A for rolling left/right as well as for sup to/from sit for both BLE and trunk control     Transfers Overall transfer level: Needs assistance Equipment used: Rolling walker (2 wheeled) Transfers: Sit to/from Stand Sit to Stand: Mod assist;+2 physical assistance;From elevated surface         General transfer comment: Pt able to stand x 2 from elevated EOB with +2 mod A but unable to come to full upright position  Ambulation/Gait             General Gait Details: Unable to advance either LE  Stairs            Wheelchair Mobility    Modified Rankin (Stroke Patients Only)       Balance Overall balance assessment: Needs assistance Sitting-balance support: Feet supported Sitting balance-Leahy Scale: Fair Sitting balance - Comments: Min posterior lean that improved as session progressed   Standing balance support: During functional activity;Bilateral upper extremity supported Standing balance-Leahy Scale: Poor Standing balance comment: Heavy assist required to keep pt in standing                             Pertinent Vitals/Pain Pain Assessment: No/denies pain Faces Pain Scale: No hurt    Home Living Family/patient expects to be discharged to:: Skilled nursing facility                 Additional Comments: Per chart review pt living at [facility] for the past 7 months    Prior Function Level of Independence: Needs assistance   Gait / Transfers Assistance Needed: Per chart review pt transfers into wheelchair or chair and is able to propel himself with  LEs and UEs; unclear how much assist he needs at baseline to transfer.  ADL's / Homemaking Assistance Needed: Assist from staff at SNF with ADLs  Comments: Pt is a poor historian with no family present to assist; history obtained from chart review     Hand Dominance   Dominant Hand: Right    Extremity/Trunk Assessment   Upper Extremity Assessment Upper Extremity Assessment: Generalized weakness    Lower Extremity Assessment Lower Extremity Assessment: Generalized  weakness       Communication   Communication: HOH  Cognition Arousal/Alertness: Awake/alert Behavior During Therapy: Flat affect Overall Cognitive Status: No family/caregiver present to determine baseline cognitive functioning                                 General Comments: Pt pleasant and cooperative and able to follow simple commands with multi-modal cuing, very [facility]      General Comments      Exercises Total Joint Exercises Ankle Circles/Pumps: AROM;Strengthening;Both;10 reps Heel Slides: AROM;AAROM;Both;10 reps;Strengthening Hip ABduction/ADduction: AAROM;Both;10 reps;Strengthening Straight Leg Raises: AAROM;Strengthening;Both;10 reps Other Exercises Other Exercises: Anterior weight shifting in sitting to address posterior instability   Assessment/Plan    PT Assessment Patient needs continued PT services  PT Problem List Decreased strength;Decreased activity tolerance;Decreased balance;Decreased mobility;Decreased knowledge of use of DME       PT Treatment Interventions DME instruction;Functional mobility training;Therapeutic activities;Therapeutic exercise;Balance training;Patient/family education    PT Goals (Current goals can be found in the Care Plan section)  Acute Rehab PT Goals Patient Stated Goal: "to be able to feed himself and transfer into chair" PT Goal Formulation: Patient unable to participate in goal setting Time For Goal Achievement: 07/11/20 Potential to Achieve Goals: Fair    Frequency Min 2X/week   Barriers to discharge        Co-evaluation               AM-PAC PT "6 Clicks" Mobility  Outcome Measure Help needed turning from your back to your side while in a flat bed without using bedrails?: A Lot Help needed moving from lying on your back to sitting on the side of a flat bed without using bedrails?: A Lot Help needed moving to and from a bed to a chair (including a wheelchair)?: Total Help needed standing up from a  chair using your arms (e.g., wheelchair or bedside chair)?: Total Help needed to walk in hospital room?: Total Help needed climbing 3-5 steps with a railing? : Total 6 Click Score: 8    End of Session Equipment Utilized During Treatment: Gait belt Activity Tolerance: Patient tolerated treatment well Patient left: in bed;with bed alarm set;with call bell/phone within reach;with nursing/sitter in room Nurse Communication: Mobility status PT Visit Diagnosis: Unsteadiness on feet (R26.81);Muscle weakness (generalized) (M62.81)    Time: 6546-5035 PT Time Calculation (min) (ACUTE ONLY): 33 min   Charges:   PT Evaluation $PT Eval Moderate Complexity: 1 Mod PT Treatments $Therapeutic Exercise: 8-22 mins        D. Scott Danne Scardina PT, DPT 06/28/20, 3:20 PM

## 2020-06-28 NOTE — TOC Progression Note (Signed)
Transition of Care [facility]) - Progression Note    Patient Details  Name: SABA NEUMAN MRN: [id number] Date of Birth: 07/05/1925  Transition [facility]) CM/SW Contact  Shelbie Hutching, RN Phone Number: 06/28/2020, 3:28 PM  Clinical Narrative:     Potential plan for discharge back to [facility] tomorrow with [facility].    Expected Discharge Plan: Hospice Medical Facility Barriers to Discharge: Other (comment)  Expected Discharge Plan and Services Expected Discharge Plan: [place] In-house Referral: Hospice / Palliative Care Discharge Planning Services: CM Consult Post Acute Care Choice: Hospice Living arrangements for the past 2 months: [place]                   DME Agency: NA       HH Arranged: NA           Social Determinants of Health (SDOH) Interventions    Readmission Risk Interventions Readmission Risk Prevention Plan 06/25/2020  Transportation Screening Not Complete  Transportation Screening Comment comfort care/ Hospice  PCP or Specialist Appt within 3-5 Days Not Complete  Not Complete comments hospice  HRI or [place] Not Complete  HRI or Home Care Consult comments hospice  Social Work Consult for [place] Planning/Counseling Complete  Palliative Care Screening Complete  Medication Review Press photographer) Complete  Some recent data might be hidden

## 2020-06-28 NOTE — Progress Notes (Signed)
PROGRESS NOTE    David Morgan  ZYS:[id number] DOB: 1925-01-12 DOA: 06/23/2020 PCP: Juluis Pitch, MD   HPI David Morgan is a 86 y.o. male with medical history significant of bladder cancer, history of CVA, hypertension, hypothyroidism, hyperlipidemia, peripheral vascular disease, COPD and dementia who has basal confusion and lives with his wife.  He was brought in by EMS due to worsening shortness of breath, altered mental status. Work-up showed bilateral pneumonia, COVID-19 negative. Patient met sepsis criteria with evidence of multifocal pneumonia. In the ED, temperature is 102.7 with blood pressure initially 174/86 dropping to 69/53 pulse 147 respirate 45 oxygen sat 86% on 4 L.  White count 14.4 hemoglobin 16.3 and platelets 186 chemistry largely within normal except for glucose 161.  Initial lactic acid 2.4, urinalysis so far negative.  Chest x-ray showed asymmetric patchy airspace disease mainly the right lung and to a lesser extent left lung base suspicious for multifocal pneumonia. Patient admitted to the hospital for treatment.  Patient is a DNR and initially placed on comfort care, she was later discontinued as patient noted to be improving somewhat.  Wife wanted patient to be placed on antibiotics and treated for pneumonia.    Assessment & Plan:   Principal Problem:   Severe sepsis ([place]) Active Problems:   BP (high blood pressure)   HLD (hyperlipidemia)   BPH (benign prostatic hyperplasia)   Chronic dementia without behavioral disturbance (HCC)   Hyperglycemia due to type 2 diabetes mellitus (HCC)   COPD with acute exacerbation (HCC)   Acute diastolic CHF (congestive heart failure) (HCC)   Lobar pneumonia (HCC)   Severe sepsis likely 2/2 multifocal pneumonia On admission, fever, elevated WBC, tachypnea, tachycardia, altered mental status & multifocal pneumonia Currently afebrile, no leukocytosis Started on Levaquin, will continue S/P Solu-Medrol--> PO  prednisone, bronchodilators, Mucinex scheduled, Tessalon Perles as needed  Acute on chronic hypoxic respiratory failure Currently back to baseline with 2 L of O2 Bronchodilators, supplemental oxygen as needed  Hypertension BP stable  Diabetes mellitus type 2 Continue SSI, Accu-Cheks, hypoglycemic protocol  Hypothyroidism Continue levothyroxine  Dementia Continue on home dose of donepezil      DVT prophylaxis: Lovenox Code Status: DNR Family Communication: Discussed with wife at bedside on 06/28/20 Disposition Plan: likely d/c back to [facility] on 06/29/20 Status is: Inpatient  Remains inpatient appropriate because:Hemodynamically unstable, Unsafe d/c plan and Inpatient level of care appropriate due to severity of illness   Dispo: The patient is from: Long-term SNF ([facility])              Anticipated d/c is to:  SNF               Anticipated d/c date is: 1 day              Patient currently is not medically stable to d/c.   Consultants:  Hospice   Procedures:   Antimicrobials: levaquin   Subjective: Today, patient noted to be more awake, alert.  Wife at bedside, assisting with feeding breakfast.  Discussed extensively with wife.  Patient denied any new complaints    Objective: Vitals:   06/28/20 0407 06/28/20 0745 06/28/20 1138 06/28/20 1512  BP: 133/78 (!) 152/66 121/63 (!) 147/79  Pulse: (!) 57 61 61 60  Resp: 20 17 17 17   Temp: (!) 97.5 F (36.4 C) 98.7 F (37.1 C) 98.2 F (36.8 C) 97.8 F (36.6 C)  TempSrc: Oral     SpO2: 94% 91% 95% 97%  Weight:  Height:        Intake/Output Summary (Last 24 hours) at 06/28/2020 1525 Last data filed at 06/28/2020 1032 Gross per 24 hour  Intake 840 ml  Output 600 ml  Net 240 ml   Filed Weights   06/23/20 1720  Weight: 81.6 kg    Examination:  General: NAD, chronically ill-appearing, awake, not oriented  Cardiovascular: S1, S2 present  Respiratory:  Coarse breath sounds bilaterally  Abdomen: Soft,  nontender, nondistended, bowel sounds present  Musculoskeletal: No bilateral pedal edema noted  Skin: Normal  Psychiatry: Normal mood, judgment and insight appear normal    Data Reviewed: I have personally reviewed following labs and imaging studies  CBC: Recent Labs  Lab 06/23/20 1731 06/23/20 2057 06/24/20 0524 06/27/20 0559  WBC 15.9* 14.4* 19.8* 5.2  NEUTROABS 13.3*  --   --   --   HGB 15.9 16.3 14.0 13.4  HCT 51.3 52.9* 44.2 42.4  MCV 89.5 90.7 88.9 88.9  PLT 187 186 155 829   Basic Metabolic Panel: Recent Labs  Lab 06/23/20 1731 06/23/20 2057 06/24/20 0524 06/27/20 0559 06/28/20 0417  NA 140  --  141 139 139  K 3.9  --  4.2 4.1 4.1  CL 101  --  108 106 106  CO2 26  --  24 26 24   GLUCOSE 161*  --  196* 164* 185*  BUN 18  --  28* 22 27*  CREATININE 0.79 1.06 1.61* 0.54* 0.84  CALCIUM 9.2  --  8.5* 8.7* 8.8*   GFR: Estimated Creatinine Clearance: 54.9 mL/min (by C-G formula based on SCr of 0.84 mg/dL). Liver Function Tests: Recent Labs  Lab 06/23/20 1731 06/24/20 0524  AST 21 23  ALT 13 12  ALKPHOS 94 67  BILITOT 1.3* 1.1  PROT 8.5* 6.4*  ALBUMIN 3.9 2.8*   No results for input(s): LIPASE, AMYLASE in the last 168 hours. No results for input(s): AMMONIA in the last 168 hours. Coagulation Profile: Recent Labs  Lab 06/23/20 1731 06/24/20 0524  INR 1.1 1.2   Cardiac Enzymes: No results for input(s): CKTOTAL, CKMB, CKMBINDEX, TROPONINI in the last 168 hours. BNP (last 3 results) No results for input(s): PROBNP in the last 8760 hours. HbA1C: No results for input(s): HGBA1C in the last 72 hours. CBG: Recent Labs  Lab 06/27/20 1223 06/27/20 1639 06/27/20 2053 06/28/20 0748 06/28/20 1118  GLUCAP 221* 198* 170* 160* 237*   Lipid Profile: No results for input(s): CHOL, HDL, LDLCALC, TRIG, CHOLHDL, LDLDIRECT in the last 72 hours. Thyroid Function Tests: No results for input(s): TSH, T4TOTAL, FREET4, T3FREE, THYROIDAB in the last 72  hours. Anemia Panel: No results for input(s): VITAMINB12, FOLATE, FERRITIN, TIBC, IRON, RETICCTPCT in the last 72 hours. Sepsis Labs: Recent Labs  Lab 06/23/20 1731 06/23/20 1936 06/24/20 0524  PROCALCITON  --   --  27.86  LATICACIDVEN 2.4* 1.5  --     Recent Results (from the past 240 hour(s))  Culture, blood (Routine x 2)     Status: None   Collection Time: 06/23/20  5:31 PM   Specimen: BLOOD  Result Value Ref Range Status   Specimen Description BLOOD RIGHT WRIST  Final   Special Requests   Final    BOTTLES DRAWN AEROBIC AND ANAEROBIC Blood Culture adequate volume   Culture   Final    NO GROWTH 5 DAYS Performed at [facility], [address] [place], [place] [address]    Report Status 06/28/2020 FINAL  Final  Urine  culture     Status: Abnormal   Collection Time: 06/23/20  7:09 PM   Specimen: Urine, Random  Result Value Ref Range Status   Specimen Description   Final    URINE, RANDOM Performed at [facility], [address], [place], [place] [address]    Special Requests   Final    NONE Performed at [facility], [place] [place], [place] [address]    Culture (A)  Final    <10,000 COLONIES/mL INSIGNIFICANT GROWTH Performed at [facility] [address] [place], [place] [address]    Report Status 06/25/2020 FINAL  Final  Culture, blood (Routine x 2)     Status: None   Collection Time: 06/23/20  7:36 PM   Specimen: BLOOD  Result Value Ref Range Status   Specimen Description BLOOD BLOOD LEFT FOREARM  Final   Special Requests   Final    BOTTLES DRAWN AEROBIC AND ANAEROBIC Blood Culture results may not be optimal due to an excessive volume of blood received in culture bottles   Culture   Final    NO GROWTH 5 DAYS Performed at [facility], [address], [place], [place] [address]    Report Status 06/28/2020 FINAL  Final  Respiratory Panel by RT PCR (Flu A&B, Covid) - Nasopharyngeal Swab     Status: None    Collection Time: 06/23/20  7:54 PM   Specimen: Nasopharyngeal Swab  Result Value Ref Range Status   SARS Coronavirus 2 by RT PCR NEGATIVE NEGATIVE Final    Comment: (NOTE) SARS-CoV-2 target nucleic acids are NOT DETECTED.  The SARS-CoV-2 RNA is generally detectable in upper respiratoy specimens during the acute phase of infection. The lowest concentration of SARS-CoV-2 viral copies this assay can detect is 131 copies/mL. A negative result does not preclude SARS-Cov-2 infection and should not be used as the sole basis for treatment or other patient management decisions. A negative result may occur with  improper specimen collection/handling, submission of specimen other than nasopharyngeal swab, presence of viral mutation(s) within the areas targeted by this assay, and inadequate number of viral copies (<131 copies/mL). A negative result must be combined with clinical observations, patient history, and epidemiological information. The expected result is Negative.  Fact Sheet for Patients:  PinkCheek.be  Fact Sheet for Healthcare Providers:  GravelBags.it  This test is no t yet approved or cleared by the [place] FDA and  has been authorized for detection and/or diagnosis of SARS-CoV-2 by FDA under an Emergency Use Authorization (EUA). This EUA will remain  in effect (meaning this test can be used) for the duration of the COVID-19 declaration under Section 564(b)(1) of the Act, 21 U.S.C. section 360bbb-3(b)(1), unless the authorization is terminated or revoked sooner.     Influenza A by PCR NEGATIVE NEGATIVE Final   Influenza B by PCR NEGATIVE NEGATIVE Final    Comment: (NOTE) The Xpert Xpress SARS-CoV-2/FLU/RSV assay is intended as an aid in  the diagnosis of influenza from Nasopharyngeal swab specimens and  should not be used as a sole basis for treatment. Nasal washings and  aspirates are unacceptable for Xpert  Xpress SARS-CoV-2/FLU/RSV  testing.  Fact Sheet for Patients: PinkCheek.be  Fact Sheet for Healthcare Providers: GravelBags.it  This test is not yet approved or cleared by the [place] FDA and  has been authorized for detection and/or diagnosis of SARS-CoV-2 by  FDA under an Emergency Use Authorization (EUA). This EUA will remain  in effect (  meaning this test can be used) for the duration of the  Covid-19 declaration under Section 564(b)(1) of the Act, 21  U.S.C. section 360bbb-3(b)(1), unless the authorization is  terminated or revoked. Performed at [facility], [address], [place], [place] [address]          Radiology Studies: No results found.      Scheduled Meds: . dextromethorphan-guaiFENesin  1 tablet Oral BID  . donepezil  5 mg Oral QHS  . enoxaparin (LOVENOX) injection  40 mg Subcutaneous Q24H  . insulin aspart  0-6 Units Subcutaneous TID WC  . ipratropium-albuterol  3 mL Nebulization BID  . levofloxacin  750 mg Oral Daily  . levothyroxine  112 mcg Oral Q0600  . methylPREDNISolone (SOLU-MEDROL) injection  40 mg Intravenous Daily  . tamsulosin  0.4 mg Oral Daily   Continuous Infusions:    LOS: 5 days    Alma Friendly, MD [facility]   If 7PM-7AM, please contact night-coverage [email or web address] 06/28/2020, 3:25 PM

## 2020-06-29 LAB — BASIC METABOLIC PANEL
Anion gap: 10 (ref 5–15)
BUN: 23 mg/dL (ref 8–23)
CO2: 26 mmol/L (ref 22–32)
Calcium: 8.6 mg/dL — ABNORMAL LOW (ref 8.9–10.3)
Chloride: 101 mmol/L (ref 98–111)
Creatinine, Ser: 0.73 mg/dL (ref 0.61–1.24)
GFR, Estimated: >60 mL/min (ref >60)
Glucose, Bld: 118 mg/dL — ABNORMAL HIGH (ref 70–99)
Potassium: 3.5 mmol/L (ref 3.5–5.1)
Sodium: 137 mmol/L (ref 135–145)

## 2020-06-29 LAB — PROCALCITONIN: Procalcitonin: 0.96 ng/mL

## 2020-06-29 LAB — GLUCOSE, CAPILLARY
Glucose-Capillary: 105 mg/dL — ABNORMAL HIGH (ref 70–99)
Glucose-Capillary: 208 mg/dL — ABNORMAL HIGH (ref 70–99)

## 2020-06-29 MED ORDER — LEVOFLOXACIN 750 MG PO TABS: 750.0000 mg | ORAL_TABLET | Freq: Every day | ORAL | 0 refills | Status: AC

## 2020-06-29 MED ORDER — DM-GUAIFENESIN ER 30-600 MG PO TB12: 1.0000 | ORAL_TABLET | Freq: Two times a day (BID) | ORAL | 0 refills | Status: AC

## 2020-06-29 MED ORDER — PREDNISONE 20 MG PO TABS: 40.0000 mg | ORAL_TABLET | Freq: Every day | ORAL | 0 refills | Status: AC

## 2020-06-29 MED ORDER — MELATONIN 5 MG PO TABS
5.0000 mg | ORAL_TABLET | Freq: Every day | ORAL | Status: DC
  Filled 2020-06-29: qty 1

## 2020-06-29 NOTE — Progress Notes (Signed)
Attempted to call report to [facility] [phone].  Placed on hold after the answering nurse did not know where pt was being sent.  I was told 107, but nurse at [facility] stated it was incorrect.  The hold time became excessive and I had to hang up

## 2020-06-29 NOTE — Progress Notes (Signed)
[place] [facility]) Hospital Liaison RN note:  Visited with patient and spouse, [name], in room. Plan is for patient to discharge back to [facility] today. Transport has been arranged for 3:30 pick up. Hospital care team has been updated.  Please send signed and completed DNR back with patient. Please provide prescriptions at discharge as needed for ongoing symptom management.  Please call with any hospice related questions or concerns.  Thank you for the opportunity to participate in this patient's care.  Zandra Abts, RN [facility] Liaison [phone]

## 2020-06-29 NOTE — TOC Transition Note (Addendum)
Transition of Care [facility]) - CM/SW Discharge Note   Patient Details  Name: David Morgan MRN: [id number] Date of Birth: 1925-05-02  Transition [facility]) CM/SW Contact:  Magnus Ivan, LCSW Phone Number: 06/29/2020, 12:22 PM   Clinical Narrative:   Patient discharging to [facility] [place] with hospice through [place]. Room 407. CSW updated patient's wife. She asked if patient will have to quarantine again at [facility], CSW confirmed with [facility] Representative Tammy that he will not have to quarantine at [facility]. RN calling report. CSW placed Medical Necessity Form, Face Sheet, and DNR in Discharge Packet. EMS arranged for 3:30 pick up time.     Final next level of care: Skilled Nursing Facility Barriers to Discharge: Barriers Resolved   Patient Goals and CMS Choice Patient states their goals for this hospitalization and ongoing recovery are:: to [facility] with hospice CMS Medicare.gov Compare Post Acute Care list provided to:: Patient Represenative (must comment) Choice offered to / list presented to : Spouse  Discharge Placement              Patient chooses bed at: [facility] [place] Patient to be transferred to facility by: EMS Name of family member notified: spouse Patient and family notified of of transfer: 06/29/20  Discharge Plan and Services In-house Referral: Hospice / Palliative Care Discharge Planning Services: CM Consult Post Acute Care Choice: Hospice            DME Agency: NA       HH Arranged: NA          Social Determinants of Health (SDOH) Interventions     Readmission Risk Interventions Readmission Risk Prevention Plan 06/25/2020  Transportation Screening Not Complete  Transportation Screening Comment comfort care/ Hospice  PCP or Specialist Appt within 3-5 Days Not Complete  Not Complete comments hospice  HRI or [place] Not Complete  HRI or Home Care Consult comments hospice  Social Work Consult for [place]  Planning/Counseling Complete  Palliative Care Screening Complete  Medication Review Press photographer) Complete  Some recent data might be hidden

## 2020-06-29 NOTE — Discharge Summary (Signed)
Discharge Summary  David Morgan ESP:[id number] DOB: 1925/06/29  PCP: Juluis Pitch, MD  Admit date: 06/23/2020 Discharge date: 06/29/2020  Time spent: 40 mins  Recommendations for Outpatient Follow-up:  1. Follow up with Hospice services  Discharge Diagnoses:  Active Hospital Problems   Diagnosis Date Noted  . Severe sepsis ([place]) 11/19/2019  . Lobar pneumonia ([place]) 06/23/2020  . Acute diastolic CHF (congestive heart failure) ([place]) 11/21/2019  . COPD with acute exacerbation ([place]) 11/19/2019  . Chronic dementia without behavioral disturbance ([place]) 11/19/2019  . Hyperglycemia due to type 2 diabetes mellitus ([place]) 11/19/2019  . BP (high blood pressure) 07/10/2015  . HLD (hyperlipidemia) 07/10/2015  . BPH (benign prostatic hyperplasia) 08/02/2014    Resolved Hospital Problems  No resolved problems to display.    Discharge Condition: Stable   Diet recommendation: As tolerated  Vitals:   06/29/20 0445 06/29/20 0806  BP: (!) 152/86 138/68  Pulse: (!) 59 66  Resp: 16 18  Temp: 98.4 F (36.9 C) 98 F (36.7 C)  SpO2: 90% 97%    History of present illness:  Dalante Minus Johnstonis a 86 y.o.malewith medical history significant ofbladder cancer, history of CVA, hypertension, hypothyroidism, hyperlipidemia, peripheral vascular disease, COPD and dementia who has basal confusion and lives with his wife. He was brought in by EMS due to worsening shortness of breath, altered mental status. Work-up showed bilateral pneumonia, COVID-19 negative. Patient met sepsis criteria with evidence of multifocal pneumonia. In the ED, temperature is 102.7 with blood pressure initially 174/86 dropping to 69/53 pulse 147 respirate 45 oxygen sat 86% on 4 L. White count 14.4 hemoglobin 16.3 and platelets 186 chemistry largely within normal except for glucose 161. Initial lactic acid 2.4, urinalysis so far negative. Chest x-ray showed asymmetric patchy airspace disease mainly the right lung and  to a lesser extent left lung base suspicious for multifocal pneumonia. Patient admitted to the hospital for treatment.  Patient is a DNR and initially placed on comfort care, was later discontinued as patient noted to be improving somewhat.  Wife wanted patient to be placed on antibiotics and treated for pneumonia.    Today, patient continues to improve, denies any worsening shortness of breath, cough, chest pain, abdominal pain, nausea/vomiting, fever/chills.  Patient stable to be discharged back to long-term SNF with hospice services  Hospital Course:  Principal Problem:   Severe sepsis ([place]) Active Problems:   BP (high blood pressure)   HLD (hyperlipidemia)   BPH (benign prostatic hyperplasia)   Chronic dementia without behavioral disturbance (HCC)   Hyperglycemia due to type 2 diabetes mellitus ([place])   COPD with acute exacerbation (HCC)   Acute diastolic CHF (congestive heart failure) (HCC)   Lobar pneumonia (HCC)   Severe sepsis likely 2/2 multifocal pneumonia On admission, fever, elevated WBC, tachypnea, tachycardia, altered mental status & multifocal pneumonia Currently afebrile, no leukocytosis Discharged on Levaquin to complete 7 days on 07/02/20 S/P Solu-Medrol--> Continue PO prednisone, last dose on 06/30/20, mucinex, inhalers  Acute on chronic hypoxic respiratory failure Currently back to baseline with 2 L of O2 Inhalers, supplemental oxygen as needed  Hypertension BP stable Held home coreg due to bradycardia, may resume per HR  Diabetes mellitus type 2 Last A1c 6.8 on 11/2019, not on any meds prior to coming May continue SSI, Accu-Cheks, hypoglycemic protocol  Hypothyroidism Continue levothyroxine  Dementia Continue on home dose of donepezil        Malnutrition Type:      Malnutrition Characteristics:  Nutrition Interventions:      Estimated body mass index is 26.58 kg/m as calculated from the following:   Height as of this  encounter: 5' 9"  (1.753 m).   Weight as of this encounter: 81.6 kg.    Procedures:  None   Consultations:  None  Discharge Exam: BP 138/68 (BP Location: Right Arm)   Pulse 66   Temp 98 F (36.7 C)   Resp 18   Ht 5' 9"  (1.753 m)   Wt 81.6 kg   SpO2 97%   BMI 26.58 kg/m     General: NAD, awake, alert Cardiovascular: S1, S2 present Respiratory: Coarse breath sounds bilaterally    Discharge Instructions You were cared for by a hospitalist during your hospital stay. If you have any questions about your discharge medications or the care you received while you were in the hospital after you are discharged, you can call the unit and asked to speak with the hospitalist on call if the hospitalist that took care of you is not available. Once you are discharged, your primary care physician will handle any further medical issues. Please note that NO REFILLS for any discharge medications will be authorized once you are discharged, as it is imperative that you return to your primary care physician (or establish a relationship with a primary care physician if you do not have one) for your aftercare needs so that they can reassess your need for medications and monitor your lab values.  Discharge Instructions    Diet - low sodium heart healthy   Complete by: As directed    Increase activity slowly   Complete by: As directed      Allergies as of 06/29/2020      Reactions   Adhesive [tape] Rash   Augmentin [amoxicillin-pot Clavulanate] Itching, Rash, Other (See Comments)   Has patient had a PCN reaction causing immediate rash, facial/tongue/throat swelling, SOB or lightheadedness with hypotension: No Has patient had a PCN reaction causing severe rash involving mucus membranes or skin necrosis: No Has patient had a PCN reaction that required hospitalization No Has patient had a PCN reaction occurring within the last 10 years: No If all of the above answers are "NO", then may proceed with  Cephalosporin use.      Medication List    STOP taking these medications   carvedilol 3.125 MG tablet Commonly known as: COREG     TAKE these medications   acetaminophen 325 MG tablet Commonly known as: TYLENOL Take 650 mg by mouth every 8 (eight) hours as needed.   aspirin 81 MG EC tablet Take 1 tablet (81 mg total) by mouth daily.   Combivent Respimat 20-100 MCG/ACT Aers respimat Generic drug: Ipratropium-Albuterol Inhale 1 puff into the lungs as needed.   dextromethorphan-guaiFENesin 30-600 MG 12hr tablet Commonly known as: MUCINEX DM Take 1 tablet by mouth 2 (two) times daily for 3 days.   donepezil 5 MG tablet Commonly known as: ARICEPT Take 5 mg by mouth daily.   levofloxacin 750 MG tablet Commonly known as: LEVAQUIN Take 1 tablet (750 mg total) by mouth daily for 3 days. Start taking on: June 30, 2020   levothyroxine 112 MCG tablet Commonly known as: SYNTHROID Take 112 mcg by mouth daily before breakfast.   Multi-Vitamins Tabs Take 1 tablet by mouth daily.   polyethylene glycol 17 g packet Commonly known as: MIRALAX / GLYCOLAX Take 17 g by mouth daily as needed.   predniSONE 20 MG tablet Commonly known as: DELTASONE  Take 2 tablets (40 mg total) by mouth daily with breakfast for 1 day. Start taking on: June 30, 2020   QUEtiapine 25 MG tablet Commonly known as: SEROQUEL Take 1 tablet (25 mg total) by mouth at bedtime.   tamsulosin 0.4 MG Caps capsule Commonly known as: FLOMAX Take 0.4 mg by mouth every evening.      Allergies  Allergen Reactions  . Adhesive [Tape] Rash  . Augmentin [Amoxicillin-Pot Clavulanate] Itching, Rash and Other (See Comments)    Has patient had a PCN reaction causing immediate rash, facial/tongue/throat swelling, SOB or lightheadedness with hypotension: No Has patient had a PCN reaction causing severe rash involving mucus membranes or skin necrosis: No Has patient had a PCN reaction that required hospitalization  No Has patient had a PCN reaction occurring within the last 10 years: No If all of the above answers are "NO", then may proceed with Cephalosporin use.     Contact information for follow-up providers    Juluis Pitch, MD. Schedule an appointment as soon as possible for a visit in 1 week(s).   Specialty: Family Medicine Contact information: [place] [address] [phone]            Contact information for after-discharge care    Destination    [facility] Preferred SNF.   Service: Skilled Nursing Contact information: [address] [place] [phone]                   The results of significant diagnostics from this hospitalization (including imaging, microbiology, ancillary and laboratory) are listed below for reference.    Significant Diagnostic Studies: DG Chest Portable 1 View  Result Date: 06/23/2020 CLINICAL DATA:  Cough and shortness of breath. EXAM: PORTABLE CHEST 1 VIEW COMPARISON:  11/30/2019 FINDINGS: Patient is rotated. Asymmetric patchy airspace disease involving the right lung and to a lesser extent left lung base. Heart size normal. No significant pleural fluid or pneumothorax. Patient's chin obscures the right lung apex. IMPRESSION: Asymmetric patchy airspace disease involving the right lung and to a lesser extent left lung base, suspicious for multifocal pneumonia. Rotated exam which limits assessment. Electronically Signed   By: Keith Rake M.D.   On: 06/23/2020 18:44    Microbiology: Recent Results (from the past 240 hour(s))  Culture, blood (Routine x 2)     Status: None   Collection Time: 06/23/20  5:31 PM   Specimen: BLOOD  Result Value Ref Range Status   Specimen Description BLOOD RIGHT WRIST  Final   Special Requests   Final    BOTTLES DRAWN AEROBIC AND ANAEROBIC Blood Culture adequate volume   Culture   Final    NO GROWTH 5 DAYS Performed at [facility], [address], [place], [place] [address]    Report Status 06/28/2020 FINAL  Final  Urine culture     Status: Abnormal   Collection Time: 06/23/20  7:09 PM   Specimen: Urine, Random  Result Value Ref Range Status   Specimen Description   Final    URINE, RANDOM Performed at [facility], [address] [place], [place] [address]    Special Requests   Final    NONE Performed at [facility], [address] [place], [place] [address]    Culture (A)  Final    <10,000 COLONIES/mL INSIGNIFICANT GROWTH Performed at [facility] [address] [place],  [address]    Report Status 06/25/2020  FINAL  Final  Culture, blood (Routine x 2)     Status: None   Collection Time: 06/23/20  7:36 PM   Specimen: BLOOD  Result Value Ref Range Status   Specimen Description BLOOD BLOOD LEFT FOREARM  Final   Special Requests   Final    BOTTLES DRAWN AEROBIC AND ANAEROBIC Blood Culture results may not be optimal due to an excessive volume of blood received in culture bottles   Culture   Final    NO GROWTH 5 DAYS Performed at [facility], [address], [place], [place] [address]    Report Status 06/28/2020 FINAL  Final  Respiratory Panel by RT PCR (Flu A&B, Covid) - Nasopharyngeal Swab     Status: None   Collection Time: 06/23/20  7:54 PM   Specimen: Nasopharyngeal Swab  Result Value Ref Range Status   SARS Coronavirus 2 by RT PCR NEGATIVE NEGATIVE Final    Comment: (NOTE) SARS-CoV-2 target nucleic acids are NOT DETECTED.  The SARS-CoV-2 RNA is generally detectable in upper respiratoy specimens during the acute phase of infection. The lowest concentration of SARS-CoV-2 viral copies this assay can detect is 131 copies/mL. A negative result does not preclude SARS-Cov-2 infection and should not be used as the sole basis for treatment or other patient management decisions. A negative result may occur with  improper specimen collection/handling, submission  of specimen other than nasopharyngeal swab, presence of viral mutation(s) within the areas targeted by this assay, and inadequate number of viral copies (<131 copies/mL). A negative result must be combined with clinical observations, patient history, and epidemiological information. The expected result is Negative.  Fact Sheet for Patients:  PinkCheek.be  Fact Sheet for Healthcare Providers:  GravelBags.it  This test is no t yet approved or cleared by the [place] FDA and  has been authorized for detection and/or diagnosis of SARS-CoV-2 by FDA under an Emergency Use Authorization (EUA). This EUA will remain  in effect (meaning this test can be used) for the duration of the COVID-19 declaration under Section 564(b)(1) of the Act, 21 U.S.C. section 360bbb-3(b)(1), unless the authorization is terminated or revoked sooner.     Influenza A by PCR NEGATIVE NEGATIVE Final   Influenza B by PCR NEGATIVE NEGATIVE Final    Comment: (NOTE) The Xpert Xpress SARS-CoV-2/FLU/RSV assay is intended as an aid in  the diagnosis of influenza from Nasopharyngeal swab specimens and  should not be used as a sole basis for treatment. Nasal washings and  aspirates are unacceptable for Xpert Xpress SARS-CoV-2/FLU/RSV  testing.  Fact Sheet for Patients: PinkCheek.be  Fact Sheet for Healthcare Providers: GravelBags.it  This test is not yet approved or cleared by the [place] FDA and  has been authorized for detection and/or diagnosis of SARS-CoV-2 by  FDA under an Emergency Use Authorization (EUA). This EUA will remain  in effect (meaning this test can be used) for the duration of the  Covid-19 declaration under Section 564(b)(1) of the Act, 21  U.S.C. section 360bbb-3(b)(1), unless the authorization is  terminated or revoked. Performed at [facility], [address] [place] [place], [place] [address]      Labs: Basic Metabolic Panel: Recent Labs  Lab 06/23/20 1731 06/23/20 1731 06/23/20 2057 06/24/20 0524 06/27/20 0559 06/28/20 0417 06/29/20 0603  NA 140  --   --  141 139 139 137  K 3.9  --   --  4.2 4.1 4.1 3.5  CL 101  --   --  108 106 106 101  CO2 26  --   --  24 26 24 26   GLUCOSE 161*  --   --  196* 164* 185* 118*  BUN 18  --   --  28* 22 27* 23  CREATININE 0.79   < > 1.06 1.61* 0.54* 0.84 0.73  CALCIUM 9.2  --   --  8.5* 8.7* 8.8* 8.6*   < > = values in this interval not displayed.   Liver Function Tests: Recent Labs  Lab 06/23/20 1731 06/24/20 0524  AST 21 23  ALT 13 12  ALKPHOS 94 67  BILITOT 1.3* 1.1  PROT 8.5* 6.4*  ALBUMIN 3.9 2.8*   No results for input(s): LIPASE, AMYLASE in the last 168 hours. No results for input(s): AMMONIA in the last 168 hours. CBC: Recent Labs  Lab 06/23/20 1731 06/23/20 2057 06/24/20 0524 06/27/20 0559  WBC 15.9* 14.4* 19.8* 5.2  NEUTROABS 13.3*  --   --   --   HGB 15.9 16.3 14.0 13.4  HCT 51.3 52.9* 44.2 42.4  MCV 89.5 90.7 88.9 88.9  PLT 187 186 155 153   Cardiac Enzymes: No results for input(s): CKTOTAL, CKMB, CKMBINDEX, TROPONINI in the last 168 hours. BNP: BNP (last 3 results) Recent Labs    11/20/19 0340  BNP 186.6*    ProBNP (last 3 results) No results for input(s): PROBNP in the last 8760 hours.  CBG: Recent Labs  Lab 06/28/20 0748 06/28/20 1118 06/28/20 1601 06/28/20 1947 06/29/20 0808  GLUCAP 160* 237* 160* 178* 105*       Signed:  Alma Friendly, MD [facility] 06/29/2020, 12:13 PM

## 2020-06-29 NOTE — TOC Progression Note (Signed)
Transition of Care [facility]) - Progression Note    Patient Details  Name: BRAHM BARBEAU MRN: [id number] Date of Birth: August 16, 1925  Transition of Care [facility]) CM/SW [place], LCSW Phone Number: 06/29/2020, 9:02 AM  Clinical Narrative:   CSW left message for Gerald Stabs at [facility] to confirm he can take patient today with hospice, waiting to hear back.    Expected Discharge Plan: Hospice Medical Facility Barriers to Discharge: Other (comment)  Expected Discharge Plan and Services Expected Discharge Plan: [place] In-house Referral: Hospice / Palliative Care Discharge Planning Services: CM Consult Post Acute Care Choice: Hospice Living arrangements for the past 2 months: [place] Expected Discharge Date: 06/29/20                 DME Agency: NA       HH Arranged: NA           Social Determinants of Health (SDOH) Interventions    Readmission Risk Interventions Readmission Risk Prevention Plan 06/25/2020  Transportation Screening Not Complete  Transportation Screening Comment comfort care/ Hospice  PCP or Specialist Appt within 3-5 Days Not Complete  Not Complete comments hospice  HRI or [place] Not Complete  HRI or Home Care Consult comments hospice  Social Work Consult for [place] Planning/Counseling Complete  Palliative Care Screening Complete  Medication Review Press photographer) Complete  Some recent data might be hidden

## 2020-07-02 DIAGNOSIS — E039 Hypothyroidism, unspecified: Secondary | ICD-10-CM | POA: Diagnosis not present

## 2020-07-02 DIAGNOSIS — I10 Essential (primary) hypertension: Secondary | ICD-10-CM | POA: Diagnosis not present

## 2020-07-02 DIAGNOSIS — F0150 Vascular dementia without behavioral disturbance: Secondary | ICD-10-CM | POA: Diagnosis not present

## 2020-07-02 DIAGNOSIS — E119 Type 2 diabetes mellitus without complications: Secondary | ICD-10-CM | POA: Diagnosis not present

## 2020-07-02 DIAGNOSIS — A419 Sepsis, unspecified organism: Secondary | ICD-10-CM | POA: Diagnosis not present

## 2020-07-04 DIAGNOSIS — E119 Type 2 diabetes mellitus without complications: Secondary | ICD-10-CM | POA: Diagnosis not present

## 2020-07-04 DIAGNOSIS — F0150 Vascular dementia without behavioral disturbance: Secondary | ICD-10-CM | POA: Diagnosis not present

## 2020-07-04 DIAGNOSIS — J189 Pneumonia, unspecified organism: Secondary | ICD-10-CM | POA: Diagnosis not present

## 2020-07-04 DIAGNOSIS — A419 Sepsis, unspecified organism: Secondary | ICD-10-CM | POA: Diagnosis not present

## 2020-07-04 DIAGNOSIS — E039 Hypothyroidism, unspecified: Secondary | ICD-10-CM | POA: Diagnosis not present

## 2020-07-04 DIAGNOSIS — N400 Enlarged prostate without lower urinary tract symptoms: Secondary | ICD-10-CM | POA: Diagnosis not present

## 2020-07-19 DIAGNOSIS — I5032 Chronic diastolic (congestive) heart failure: Secondary | ICD-10-CM | POA: Diagnosis not present

## 2020-07-19 DIAGNOSIS — R4701 Aphasia: Secondary | ICD-10-CM | POA: Diagnosis not present

## 2020-07-19 DIAGNOSIS — I10 Essential (primary) hypertension: Secondary | ICD-10-CM | POA: Diagnosis not present

## 2020-07-19 DIAGNOSIS — E7800 Pure hypercholesterolemia, unspecified: Secondary | ICD-10-CM | POA: Diagnosis not present

## 2020-07-19 DIAGNOSIS — E1165 Type 2 diabetes mellitus with hyperglycemia: Secondary | ICD-10-CM | POA: Diagnosis not present

## 2020-08-06 DIAGNOSIS — M79605 Pain in left leg: Secondary | ICD-10-CM | POA: Diagnosis not present

## 2020-08-06 DIAGNOSIS — E1165 Type 2 diabetes mellitus with hyperglycemia: Secondary | ICD-10-CM | POA: Diagnosis not present

## 2020-08-06 DIAGNOSIS — E039 Hypothyroidism, unspecified: Secondary | ICD-10-CM | POA: Diagnosis not present

## 2020-08-06 DIAGNOSIS — F0150 Vascular dementia without behavioral disturbance: Secondary | ICD-10-CM | POA: Diagnosis not present

## 2020-08-29 DIAGNOSIS — R051 Acute cough: Secondary | ICD-10-CM | POA: Diagnosis not present

## 2020-08-29 DIAGNOSIS — U071 COVID-19: Secondary | ICD-10-CM | POA: Diagnosis not present

## 2020-08-29 DIAGNOSIS — R062 Wheezing: Secondary | ICD-10-CM | POA: Diagnosis not present

## 2020-08-30 DIAGNOSIS — R062 Wheezing: Secondary | ICD-10-CM | POA: Diagnosis not present

## 2020-09-11 DIAGNOSIS — E118 Type 2 diabetes mellitus with unspecified complications: Secondary | ICD-10-CM | POA: Diagnosis not present

## 2020-09-11 DIAGNOSIS — C679 Malignant neoplasm of bladder, unspecified: Secondary | ICD-10-CM | POA: Diagnosis not present

## 2020-09-11 DIAGNOSIS — M6281 Muscle weakness (generalized): Secondary | ICD-10-CM | POA: Diagnosis not present

## 2020-09-11 DIAGNOSIS — G4700 Insomnia, unspecified: Secondary | ICD-10-CM | POA: Diagnosis not present

## 2020-09-11 DIAGNOSIS — N400 Enlarged prostate without lower urinary tract symptoms: Secondary | ICD-10-CM | POA: Diagnosis not present

## 2020-09-11 DIAGNOSIS — F0150 Vascular dementia without behavioral disturbance: Secondary | ICD-10-CM | POA: Diagnosis not present

## 2020-09-11 DIAGNOSIS — E039 Hypothyroidism, unspecified: Secondary | ICD-10-CM | POA: Diagnosis not present

## 2020-09-11 DIAGNOSIS — J449 Chronic obstructive pulmonary disease, unspecified: Secondary | ICD-10-CM | POA: Diagnosis not present

## 2020-09-11 DIAGNOSIS — H9193 Unspecified hearing loss, bilateral: Secondary | ICD-10-CM | POA: Diagnosis not present

## 2020-10-10 DIAGNOSIS — J449 Chronic obstructive pulmonary disease, unspecified: Secondary | ICD-10-CM | POA: Diagnosis not present

## 2020-10-10 DIAGNOSIS — N400 Enlarged prostate without lower urinary tract symptoms: Secondary | ICD-10-CM | POA: Diagnosis not present

## 2020-10-10 DIAGNOSIS — C679 Malignant neoplasm of bladder, unspecified: Secondary | ICD-10-CM | POA: Diagnosis not present

## 2020-10-10 DIAGNOSIS — M6281 Muscle weakness (generalized): Secondary | ICD-10-CM | POA: Diagnosis not present

## 2020-10-10 DIAGNOSIS — F0150 Vascular dementia without behavioral disturbance: Secondary | ICD-10-CM | POA: Diagnosis not present

## 2020-10-10 DIAGNOSIS — E039 Hypothyroidism, unspecified: Secondary | ICD-10-CM | POA: Diagnosis not present

## 2020-10-10 DIAGNOSIS — E118 Type 2 diabetes mellitus with unspecified complications: Secondary | ICD-10-CM | POA: Diagnosis not present

## 2020-10-10 DIAGNOSIS — G4700 Insomnia, unspecified: Secondary | ICD-10-CM | POA: Diagnosis not present

## 2020-10-10 DIAGNOSIS — H9193 Unspecified hearing loss, bilateral: Secondary | ICD-10-CM | POA: Diagnosis not present

## 2020-10-22 DIAGNOSIS — F0151 Vascular dementia with behavioral disturbance: Secondary | ICD-10-CM | POA: Diagnosis not present

## 2020-10-22 DIAGNOSIS — G4701 Insomnia due to medical condition: Secondary | ICD-10-CM | POA: Diagnosis not present

## 2020-10-22 DIAGNOSIS — R451 Restlessness and agitation: Secondary | ICD-10-CM | POA: Diagnosis not present

## 2021-01-25 DIAGNOSIS — E118 Type 2 diabetes mellitus with unspecified complications: Secondary | ICD-10-CM | POA: Diagnosis not present

## 2021-01-25 DIAGNOSIS — N400 Enlarged prostate without lower urinary tract symptoms: Secondary | ICD-10-CM | POA: Diagnosis not present

## 2021-01-25 DIAGNOSIS — F0150 Vascular dementia without behavioral disturbance: Secondary | ICD-10-CM | POA: Diagnosis not present

## 2021-01-25 DIAGNOSIS — J449 Chronic obstructive pulmonary disease, unspecified: Secondary | ICD-10-CM | POA: Diagnosis not present

## 2021-01-25 DIAGNOSIS — C679 Malignant neoplasm of bladder, unspecified: Secondary | ICD-10-CM | POA: Diagnosis not present

## 2021-01-25 DIAGNOSIS — M6281 Muscle weakness (generalized): Secondary | ICD-10-CM | POA: Diagnosis not present

## 2021-01-28 DIAGNOSIS — E7800 Pure hypercholesterolemia, unspecified: Secondary | ICD-10-CM | POA: Diagnosis not present

## 2021-01-28 DIAGNOSIS — I10 Essential (primary) hypertension: Secondary | ICD-10-CM | POA: Diagnosis not present

## 2021-01-28 DIAGNOSIS — I350 Nonrheumatic aortic (valve) stenosis: Secondary | ICD-10-CM | POA: Diagnosis not present

## 2021-01-28 DIAGNOSIS — I5032 Chronic diastolic (congestive) heart failure: Secondary | ICD-10-CM | POA: Diagnosis not present

## 2021-01-28 DIAGNOSIS — H9190 Unspecified hearing loss, unspecified ear: Secondary | ICD-10-CM | POA: Diagnosis not present

## 2021-02-15 DIAGNOSIS — F0151 Vascular dementia with behavioral disturbance: Secondary | ICD-10-CM | POA: Diagnosis not present

## 2021-02-15 DIAGNOSIS — R451 Restlessness and agitation: Secondary | ICD-10-CM | POA: Diagnosis not present

## 2021-02-22 DIAGNOSIS — I10 Essential (primary) hypertension: Secondary | ICD-10-CM | POA: Diagnosis not present

## 2021-02-22 DIAGNOSIS — D649 Anemia, unspecified: Secondary | ICD-10-CM | POA: Diagnosis not present

## 2021-02-22 DIAGNOSIS — E119 Type 2 diabetes mellitus without complications: Secondary | ICD-10-CM | POA: Diagnosis not present

## 2021-03-20 DIAGNOSIS — F0150 Vascular dementia without behavioral disturbance: Secondary | ICD-10-CM | POA: Diagnosis not present

## 2021-03-20 DIAGNOSIS — E039 Hypothyroidism, unspecified: Secondary | ICD-10-CM | POA: Diagnosis not present

## 2021-03-20 DIAGNOSIS — N419 Inflammatory disease of prostate, unspecified: Secondary | ICD-10-CM | POA: Diagnosis not present

## 2021-03-20 DIAGNOSIS — Z8616 Personal history of COVID-19: Secondary | ICD-10-CM | POA: Diagnosis not present

## 2021-03-20 DIAGNOSIS — E785 Hyperlipidemia, unspecified: Secondary | ICD-10-CM | POA: Diagnosis not present

## 2021-03-20 DIAGNOSIS — G20 Parkinson's disease: Secondary | ICD-10-CM | POA: Diagnosis not present

## 2021-03-20 DIAGNOSIS — R627 Adult failure to thrive: Secondary | ICD-10-CM | POA: Diagnosis not present

## 2021-03-21 DIAGNOSIS — H1045 Other chronic allergic conjunctivitis: Secondary | ICD-10-CM | POA: Diagnosis not present

## 2021-03-21 DIAGNOSIS — F0151 Vascular dementia with behavioral disturbance: Secondary | ICD-10-CM | POA: Diagnosis not present

## 2021-03-21 DIAGNOSIS — R451 Restlessness and agitation: Secondary | ICD-10-CM | POA: Diagnosis not present

## 2021-03-26 DIAGNOSIS — F0151 Vascular dementia with behavioral disturbance: Secondary | ICD-10-CM | POA: Diagnosis not present

## 2021-03-26 DIAGNOSIS — H1045 Other chronic allergic conjunctivitis: Secondary | ICD-10-CM | POA: Diagnosis not present

## 2021-03-26 DIAGNOSIS — R451 Restlessness and agitation: Secondary | ICD-10-CM | POA: Diagnosis not present

## 2021-03-26 DIAGNOSIS — C679 Malignant neoplasm of bladder, unspecified: Secondary | ICD-10-CM | POA: Diagnosis not present

## 2021-04-02 DIAGNOSIS — F0150 Vascular dementia without behavioral disturbance: Secondary | ICD-10-CM | POA: Diagnosis not present

## 2021-04-02 DIAGNOSIS — J449 Chronic obstructive pulmonary disease, unspecified: Secondary | ICD-10-CM | POA: Diagnosis not present

## 2021-04-02 DIAGNOSIS — E039 Hypothyroidism, unspecified: Secondary | ICD-10-CM | POA: Diagnosis not present

## 2021-04-02 DIAGNOSIS — N400 Enlarged prostate without lower urinary tract symptoms: Secondary | ICD-10-CM | POA: Diagnosis not present

## 2021-04-02 DIAGNOSIS — C679 Malignant neoplasm of bladder, unspecified: Secondary | ICD-10-CM | POA: Diagnosis not present

## 2021-04-03 DIAGNOSIS — I69318 Other symptoms and signs involving cognitive functions following cerebral infarction: Secondary | ICD-10-CM | POA: Diagnosis not present

## 2021-04-03 DIAGNOSIS — F0151 Vascular dementia with behavioral disturbance: Secondary | ICD-10-CM | POA: Diagnosis not present

## 2021-04-17 DIAGNOSIS — R451 Restlessness and agitation: Secondary | ICD-10-CM | POA: Diagnosis not present

## 2021-04-17 DIAGNOSIS — F0150 Vascular dementia without behavioral disturbance: Secondary | ICD-10-CM | POA: Diagnosis not present

## 2021-04-17 DIAGNOSIS — E118 Type 2 diabetes mellitus with unspecified complications: Secondary | ICD-10-CM | POA: Diagnosis not present

## 2021-04-17 DIAGNOSIS — I10 Essential (primary) hypertension: Secondary | ICD-10-CM | POA: Diagnosis not present

## 2021-04-17 DIAGNOSIS — G4700 Insomnia, unspecified: Secondary | ICD-10-CM | POA: Diagnosis not present

## 2021-05-01 DIAGNOSIS — F0151 Vascular dementia with behavioral disturbance: Secondary | ICD-10-CM | POA: Diagnosis not present

## 2021-05-01 DIAGNOSIS — R451 Restlessness and agitation: Secondary | ICD-10-CM | POA: Diagnosis not present

## 2021-05-01 DIAGNOSIS — G4701 Insomnia due to medical condition: Secondary | ICD-10-CM | POA: Diagnosis not present

## 2021-05-01 DIAGNOSIS — I69318 Other symptoms and signs involving cognitive functions following cerebral infarction: Secondary | ICD-10-CM | POA: Diagnosis not present

## 2021-05-16 DIAGNOSIS — H1033 Unspecified acute conjunctivitis, bilateral: Secondary | ICD-10-CM | POA: Diagnosis not present

## 2021-05-16 DIAGNOSIS — G4700 Insomnia, unspecified: Secondary | ICD-10-CM | POA: Diagnosis not present

## 2021-05-16 DIAGNOSIS — R451 Restlessness and agitation: Secondary | ICD-10-CM | POA: Diagnosis not present

## 2021-05-16 DIAGNOSIS — R1030 Lower abdominal pain, unspecified: Secondary | ICD-10-CM | POA: Diagnosis not present

## 2021-05-16 DIAGNOSIS — F0150 Vascular dementia without behavioral disturbance: Secondary | ICD-10-CM | POA: Diagnosis not present

## 2021-05-21 DIAGNOSIS — E039 Hypothyroidism, unspecified: Secondary | ICD-10-CM | POA: Diagnosis not present

## 2021-05-21 DIAGNOSIS — C679 Malignant neoplasm of bladder, unspecified: Secondary | ICD-10-CM | POA: Diagnosis not present

## 2021-05-21 DIAGNOSIS — E118 Type 2 diabetes mellitus with unspecified complications: Secondary | ICD-10-CM | POA: Diagnosis not present

## 2021-05-21 DIAGNOSIS — N400 Enlarged prostate without lower urinary tract symptoms: Secondary | ICD-10-CM | POA: Diagnosis not present

## 2021-05-21 DIAGNOSIS — F0150 Vascular dementia without behavioral disturbance: Secondary | ICD-10-CM | POA: Diagnosis not present

## 2021-05-21 DIAGNOSIS — J449 Chronic obstructive pulmonary disease, unspecified: Secondary | ICD-10-CM | POA: Diagnosis not present

## 2021-05-22 DIAGNOSIS — N390 Urinary tract infection, site not specified: Secondary | ICD-10-CM | POA: Diagnosis not present

## 2021-05-22 DIAGNOSIS — B964 Proteus (mirabilis) (morganii) as the cause of diseases classified elsewhere: Secondary | ICD-10-CM | POA: Diagnosis not present

## 2021-05-29 DIAGNOSIS — G4701 Insomnia due to medical condition: Secondary | ICD-10-CM | POA: Diagnosis not present

## 2021-05-29 DIAGNOSIS — R451 Restlessness and agitation: Secondary | ICD-10-CM | POA: Diagnosis not present

## 2021-05-29 DIAGNOSIS — F01C18 Vascular dementia, severe, with other behavioral disturbance: Secondary | ICD-10-CM | POA: Diagnosis not present

## 2021-05-29 DIAGNOSIS — I69318 Other symptoms and signs involving cognitive functions following cerebral infarction: Secondary | ICD-10-CM | POA: Diagnosis not present

## 2021-06-05 DIAGNOSIS — Z23 Encounter for immunization: Secondary | ICD-10-CM | POA: Diagnosis not present

## 2021-06-21 DIAGNOSIS — M6281 Muscle weakness (generalized): Secondary | ICD-10-CM | POA: Diagnosis not present

## 2021-06-21 DIAGNOSIS — F0150 Vascular dementia without behavioral disturbance: Secondary | ICD-10-CM | POA: Diagnosis not present

## 2021-06-21 DIAGNOSIS — N419 Inflammatory disease of prostate, unspecified: Secondary | ICD-10-CM | POA: Diagnosis not present

## 2021-06-21 DIAGNOSIS — E785 Hyperlipidemia, unspecified: Secondary | ICD-10-CM | POA: Diagnosis not present

## 2021-06-21 DIAGNOSIS — G20 Parkinson's disease: Secondary | ICD-10-CM | POA: Diagnosis not present

## 2021-06-21 DIAGNOSIS — R2681 Unsteadiness on feet: Secondary | ICD-10-CM | POA: Diagnosis not present

## 2021-06-26 DIAGNOSIS — F01C18 Vascular dementia, severe, with other behavioral disturbance: Secondary | ICD-10-CM | POA: Diagnosis not present

## 2021-06-26 DIAGNOSIS — I69318 Other symptoms and signs involving cognitive functions following cerebral infarction: Secondary | ICD-10-CM | POA: Diagnosis not present

## 2021-06-26 DIAGNOSIS — G4701 Insomnia due to medical condition: Secondary | ICD-10-CM | POA: Diagnosis not present

## 2021-06-26 DIAGNOSIS — R451 Restlessness and agitation: Secondary | ICD-10-CM | POA: Diagnosis not present

## 2021-07-02 DIAGNOSIS — N400 Enlarged prostate without lower urinary tract symptoms: Secondary | ICD-10-CM | POA: Diagnosis not present

## 2021-07-02 DIAGNOSIS — F0631 Mood disorder due to known physiological condition with depressive features: Secondary | ICD-10-CM | POA: Diagnosis not present

## 2021-07-02 DIAGNOSIS — H9193 Unspecified hearing loss, bilateral: Secondary | ICD-10-CM | POA: Diagnosis not present

## 2021-07-02 DIAGNOSIS — R627 Adult failure to thrive: Secondary | ICD-10-CM | POA: Diagnosis not present

## 2021-07-02 DIAGNOSIS — E039 Hypothyroidism, unspecified: Secondary | ICD-10-CM | POA: Diagnosis not present

## 2021-07-02 DIAGNOSIS — I739 Peripheral vascular disease, unspecified: Secondary | ICD-10-CM | POA: Diagnosis not present

## 2021-07-02 DIAGNOSIS — G4700 Insomnia, unspecified: Secondary | ICD-10-CM | POA: Diagnosis not present

## 2021-07-02 DIAGNOSIS — F0150 Vascular dementia without behavioral disturbance: Secondary | ICD-10-CM | POA: Diagnosis not present

## 2021-07-02 DIAGNOSIS — J449 Chronic obstructive pulmonary disease, unspecified: Secondary | ICD-10-CM | POA: Diagnosis not present

## 2021-07-24 DIAGNOSIS — G4701 Insomnia due to medical condition: Secondary | ICD-10-CM | POA: Diagnosis not present

## 2021-07-24 DIAGNOSIS — N419 Inflammatory disease of prostate, unspecified: Secondary | ICD-10-CM | POA: Diagnosis not present

## 2021-07-24 DIAGNOSIS — E039 Hypothyroidism, unspecified: Secondary | ICD-10-CM | POA: Diagnosis not present

## 2021-07-24 DIAGNOSIS — G20 Parkinson's disease: Secondary | ICD-10-CM | POA: Diagnosis not present

## 2021-07-24 DIAGNOSIS — R2681 Unsteadiness on feet: Secondary | ICD-10-CM | POA: Diagnosis not present

## 2021-07-24 DIAGNOSIS — F01C18 Vascular dementia, severe, with other behavioral disturbance: Secondary | ICD-10-CM | POA: Diagnosis not present

## 2021-07-24 DIAGNOSIS — R451 Restlessness and agitation: Secondary | ICD-10-CM | POA: Diagnosis not present

## 2021-07-24 DIAGNOSIS — I69318 Other symptoms and signs involving cognitive functions following cerebral infarction: Secondary | ICD-10-CM | POA: Diagnosis not present

## 2021-07-24 DIAGNOSIS — F0150 Vascular dementia without behavioral disturbance: Secondary | ICD-10-CM | POA: Diagnosis not present

## 2021-07-24 DIAGNOSIS — E785 Hyperlipidemia, unspecified: Secondary | ICD-10-CM | POA: Diagnosis not present

## 2021-07-29 DIAGNOSIS — E039 Hypothyroidism, unspecified: Secondary | ICD-10-CM | POA: Diagnosis not present

## 2021-07-29 DIAGNOSIS — J449 Chronic obstructive pulmonary disease, unspecified: Secondary | ICD-10-CM | POA: Diagnosis not present

## 2021-07-29 DIAGNOSIS — F0631 Mood disorder due to known physiological condition with depressive features: Secondary | ICD-10-CM | POA: Diagnosis not present

## 2021-07-29 DIAGNOSIS — R627 Adult failure to thrive: Secondary | ICD-10-CM | POA: Diagnosis not present

## 2021-07-29 DIAGNOSIS — F0150 Vascular dementia without behavioral disturbance: Secondary | ICD-10-CM | POA: Diagnosis not present

## 2021-07-29 DIAGNOSIS — I739 Peripheral vascular disease, unspecified: Secondary | ICD-10-CM | POA: Diagnosis not present

## 2021-07-29 DIAGNOSIS — N400 Enlarged prostate without lower urinary tract symptoms: Secondary | ICD-10-CM | POA: Diagnosis not present

## 2021-07-29 DIAGNOSIS — H9193 Unspecified hearing loss, bilateral: Secondary | ICD-10-CM | POA: Diagnosis not present

## 2021-07-31 DIAGNOSIS — E7800 Pure hypercholesterolemia, unspecified: Secondary | ICD-10-CM | POA: Diagnosis not present

## 2021-07-31 DIAGNOSIS — F0390 Unspecified dementia without behavioral disturbance: Secondary | ICD-10-CM | POA: Diagnosis not present

## 2021-07-31 DIAGNOSIS — Z23 Encounter for immunization: Secondary | ICD-10-CM | POA: Diagnosis not present

## 2021-07-31 DIAGNOSIS — I350 Nonrheumatic aortic (valve) stenosis: Secondary | ICD-10-CM | POA: Diagnosis not present

## 2021-07-31 DIAGNOSIS — I10 Essential (primary) hypertension: Secondary | ICD-10-CM | POA: Diagnosis not present

## 2021-07-31 DIAGNOSIS — I5032 Chronic diastolic (congestive) heart failure: Secondary | ICD-10-CM | POA: Diagnosis not present

## 2021-08-22 DIAGNOSIS — F0150 Vascular dementia without behavioral disturbance: Secondary | ICD-10-CM | POA: Diagnosis not present

## 2021-08-22 DIAGNOSIS — N400 Enlarged prostate without lower urinary tract symptoms: Secondary | ICD-10-CM | POA: Diagnosis not present

## 2021-08-22 DIAGNOSIS — F0631 Mood disorder due to known physiological condition with depressive features: Secondary | ICD-10-CM | POA: Diagnosis not present

## 2021-08-22 DIAGNOSIS — G4700 Insomnia, unspecified: Secondary | ICD-10-CM | POA: Diagnosis not present

## 2021-08-22 DIAGNOSIS — R451 Restlessness and agitation: Secondary | ICD-10-CM | POA: Diagnosis not present

## 2021-08-23 DIAGNOSIS — N390 Urinary tract infection, site not specified: Secondary | ICD-10-CM | POA: Diagnosis not present

## 2021-08-24 DIAGNOSIS — N390 Urinary tract infection, site not specified: Secondary | ICD-10-CM | POA: Diagnosis not present

## 2021-08-28 DIAGNOSIS — G4701 Insomnia due to medical condition: Secondary | ICD-10-CM | POA: Diagnosis not present

## 2021-08-28 DIAGNOSIS — F01C18 Vascular dementia, severe, with other behavioral disturbance: Secondary | ICD-10-CM | POA: Diagnosis not present

## 2021-08-28 DIAGNOSIS — I69318 Other symptoms and signs involving cognitive functions following cerebral infarction: Secondary | ICD-10-CM | POA: Diagnosis not present

## 2021-08-29 DIAGNOSIS — N390 Urinary tract infection, site not specified: Secondary | ICD-10-CM | POA: Diagnosis not present

## 2021-09-03 DIAGNOSIS — R400 Somnolence: Secondary | ICD-10-CM | POA: Diagnosis not present

## 2021-09-03 DIAGNOSIS — R451 Restlessness and agitation: Secondary | ICD-10-CM | POA: Diagnosis not present

## 2021-09-03 DIAGNOSIS — R634 Abnormal weight loss: Secondary | ICD-10-CM | POA: Diagnosis not present

## 2021-09-03 DIAGNOSIS — G4700 Insomnia, unspecified: Secondary | ICD-10-CM | POA: Diagnosis not present

## 2021-09-06 ENCOUNTER — Other Ambulatory Visit: Payer: PPO | Admitting: Primary Care

## 2021-09-06 ENCOUNTER — Other Ambulatory Visit: Payer: Self-pay

## 2021-09-06 DIAGNOSIS — F0390 Unspecified dementia without behavioral disturbance: Secondary | ICD-10-CM

## 2021-09-06 DIAGNOSIS — I5032 Chronic diastolic (congestive) heart failure: Secondary | ICD-10-CM

## 2021-09-06 DIAGNOSIS — Z515 Encounter for palliative care: Secondary | ICD-10-CM

## 2021-09-06 NOTE — Progress Notes (Signed)
[facility] Consult Note Telephone: [phone]  Fax: [phone]    Date of encounter: 09/06/21 2:45 pm PATIENT NAME: David Morgan [address] [place] [address]   [phone] (home)  DOB: 12/29/1924 MRN: [id number] PRIMARY CARE PROVIDER:    [place], [place],  [address] [place] [address] [phone]  REFERRING PROVID[place] [address] [place],   [address] [phone]  RESPONSIBLE PARTY:    Contact Information     Name Relation Home Work Mobile   [place] Spouse [phone]          I met face to face with [facility]. Palliative Care was asked to follow this patient by consultation request of  Rica Koyanagi, MD to address advance care planning and complex medical decision making. This is a follow up visit.                                   ASSESSMENT AND PLAN / RECOMMENDATIONS:   Advance Care Planning/Goals of Care: Goals include to maximize quality of life and symptom management.  CODE STATUS: DNR Re established care with palliative medicine  Symptom Management/Plan:  Patient in bed, napping. Roused for a few minutes but could not answer questions. Staff state he is stable since d/c from hospice several months ago. Will follow for pc needs and supportive management.   Follow up Palliative Care Visit: Palliative care will continue to follow for complex medical decision making, advance care planning, and clarification of goals. Return 4 weeks or prn.  This visit was coded based on medical decision making (MDM).  PPS: 30%  HOSPICE ELIGIBILITY/DIAGNOSIS: TBD  Chief Complaint: dementia  HISTORY OF PRESENT ILLNESS:  David Morgan is a 88 y.o. year old male  with dementia, debility .   History obtained from review of EMR, discussion with primary team, and interview with family, facility staff/caregiver and/or  David Morgan.  I reviewed available labs, medications, imaging, studies and related documents from the EMR.  Records reviewed and summarized above.   ROS [name]  General: NAD ENMT: denies dysphagia Pulmonary: denies cough, denies increased SOB Abdomen: endorses good appetite, denies constipation, endorses incontinence of bowel GU: denies dysuria, endorses incontinence of urine MSK:  endorses increased weakness,  no falls reported Skin: denies rashes or wounds Neurological: denies pain, denies insomnia Psych: Endorses  flat mood, drowsy Heme/lymph/immuno: denies bruises, abnormal bleeding  Physical Exam: Current and past weights: 168 lbs, was 174 lbs 2 months ago. 3% loss Constitutional: NAD General: frail appearing,  EYES: anicteric sclera, lids intact, no discharge  ENMT: intact hearing, oral mucous membranes moistt CV: RRR, no LE edema Pulmonary: no increased work of breathing, no cough, room air Abdomen: intake 75%, normo-active BS + 4 quadrants, soft and non tender, no ascites GU: deferred MSK: + sarcopenia, moves all extremities, non ambulatory Skin: warm and dry, no rashes or wounds on visible skin Neuro:  + generalized weakness,  severe cognitive  impairment, non verbal, FAST 7 C Psych: non-anxious affect, A and O x 1 Hem/lymph/immuno: no widespread bruising   Thank you for the opportunity to participate in the care of David Morgan.  The palliative care team will continue to follow. Please call our office at [phone] if we can be of additional assistance.   Jason Coop, NP DNP, AGPCNP-BC  COVID-19 PATIENT SCREENING TOOL Asked and negative response unless otherwise noted:   Have you had symptoms of covid, tested positive or been in contact with someone with symptoms/positive test in the past 5-10 days?

## 2021-09-09 DIAGNOSIS — Z961 Presence of intraocular lens: Secondary | ICD-10-CM | POA: Diagnosis not present

## 2021-09-09 DIAGNOSIS — H04123 Dry eye syndrome of bilateral lacrimal glands: Secondary | ICD-10-CM | POA: Diagnosis not present

## 2021-09-09 DIAGNOSIS — E119 Type 2 diabetes mellitus without complications: Secondary | ICD-10-CM | POA: Diagnosis not present

## 2021-09-18 DIAGNOSIS — W06XXXA Fall from bed, initial encounter: Secondary | ICD-10-CM | POA: Diagnosis not present

## 2021-09-18 DIAGNOSIS — M6281 Muscle weakness (generalized): Secondary | ICD-10-CM | POA: Diagnosis not present

## 2021-09-18 DIAGNOSIS — R451 Restlessness and agitation: Secondary | ICD-10-CM | POA: Diagnosis not present

## 2021-09-18 DIAGNOSIS — S41101A Unspecified open wound of right upper arm, initial encounter: Secondary | ICD-10-CM | POA: Diagnosis not present

## 2021-09-18 DIAGNOSIS — G4700 Insomnia, unspecified: Secondary | ICD-10-CM | POA: Diagnosis not present

## 2021-09-20 DIAGNOSIS — M6281 Muscle weakness (generalized): Secondary | ICD-10-CM | POA: Diagnosis not present

## 2021-09-21 DIAGNOSIS — F0150 Vascular dementia without behavioral disturbance: Secondary | ICD-10-CM | POA: Diagnosis not present

## 2021-09-21 DIAGNOSIS — G20 Parkinson's disease: Secondary | ICD-10-CM | POA: Diagnosis not present

## 2021-09-21 DIAGNOSIS — N390 Urinary tract infection, site not specified: Secondary | ICD-10-CM | POA: Diagnosis not present

## 2021-09-25 DIAGNOSIS — G4701 Insomnia due to medical condition: Secondary | ICD-10-CM | POA: Diagnosis not present

## 2021-09-25 DIAGNOSIS — F01C18 Vascular dementia, severe, with other behavioral disturbance: Secondary | ICD-10-CM | POA: Diagnosis not present

## 2021-09-25 DIAGNOSIS — R451 Restlessness and agitation: Secondary | ICD-10-CM | POA: Diagnosis not present

## 2021-09-25 DIAGNOSIS — I69318 Other symptoms and signs involving cognitive functions following cerebral infarction: Secondary | ICD-10-CM | POA: Diagnosis not present

## 2021-09-27 DIAGNOSIS — G8929 Other chronic pain: Secondary | ICD-10-CM | POA: Diagnosis not present

## 2021-09-27 DIAGNOSIS — R627 Adult failure to thrive: Secondary | ICD-10-CM | POA: Diagnosis not present

## 2021-09-27 DIAGNOSIS — J449 Chronic obstructive pulmonary disease, unspecified: Secondary | ICD-10-CM | POA: Diagnosis not present

## 2021-09-27 DIAGNOSIS — N400 Enlarged prostate without lower urinary tract symptoms: Secondary | ICD-10-CM | POA: Diagnosis not present

## 2021-09-27 DIAGNOSIS — I739 Peripheral vascular disease, unspecified: Secondary | ICD-10-CM | POA: Diagnosis not present

## 2021-09-27 DIAGNOSIS — H04123 Dry eye syndrome of bilateral lacrimal glands: Secondary | ICD-10-CM | POA: Diagnosis not present

## 2021-09-27 DIAGNOSIS — E039 Hypothyroidism, unspecified: Secondary | ICD-10-CM | POA: Diagnosis not present

## 2021-09-27 DIAGNOSIS — F0631 Mood disorder due to known physiological condition with depressive features: Secondary | ICD-10-CM | POA: Diagnosis not present

## 2021-09-27 DIAGNOSIS — H9193 Unspecified hearing loss, bilateral: Secondary | ICD-10-CM | POA: Diagnosis not present

## 2021-10-16 DIAGNOSIS — I69318 Other symptoms and signs involving cognitive functions following cerebral infarction: Secondary | ICD-10-CM | POA: Diagnosis not present

## 2021-10-16 DIAGNOSIS — F01C18 Vascular dementia, severe, with other behavioral disturbance: Secondary | ICD-10-CM | POA: Diagnosis not present

## 2021-10-16 DIAGNOSIS — R451 Restlessness and agitation: Secondary | ICD-10-CM | POA: Diagnosis not present

## 2021-10-16 DIAGNOSIS — G4701 Insomnia due to medical condition: Secondary | ICD-10-CM | POA: Diagnosis not present

## 2021-11-14 DIAGNOSIS — G4701 Insomnia due to medical condition: Secondary | ICD-10-CM | POA: Diagnosis not present

## 2021-11-14 DIAGNOSIS — F01C18 Vascular dementia, severe, with other behavioral disturbance: Secondary | ICD-10-CM | POA: Diagnosis not present

## 2021-11-14 DIAGNOSIS — I69318 Other symptoms and signs involving cognitive functions following cerebral infarction: Secondary | ICD-10-CM | POA: Diagnosis not present

## 2021-11-19 DIAGNOSIS — F0631 Mood disorder due to known physiological condition with depressive features: Secondary | ICD-10-CM | POA: Diagnosis not present

## 2021-11-19 DIAGNOSIS — F01518 Vascular dementia, unspecified severity, with other behavioral disturbance: Secondary | ICD-10-CM | POA: Diagnosis not present

## 2021-11-21 DIAGNOSIS — N400 Enlarged prostate without lower urinary tract symptoms: Secondary | ICD-10-CM | POA: Diagnosis not present

## 2021-11-21 DIAGNOSIS — J449 Chronic obstructive pulmonary disease, unspecified: Secondary | ICD-10-CM | POA: Diagnosis not present

## 2021-11-21 DIAGNOSIS — I739 Peripheral vascular disease, unspecified: Secondary | ICD-10-CM | POA: Diagnosis not present

## 2021-11-21 DIAGNOSIS — E039 Hypothyroidism, unspecified: Secondary | ICD-10-CM | POA: Diagnosis not present

## 2021-12-05 DIAGNOSIS — E785 Hyperlipidemia, unspecified: Secondary | ICD-10-CM | POA: Diagnosis not present

## 2021-12-05 DIAGNOSIS — F01518 Vascular dementia, unspecified severity, with other behavioral disturbance: Secondary | ICD-10-CM | POA: Diagnosis not present

## 2021-12-05 DIAGNOSIS — G20 Parkinson's disease: Secondary | ICD-10-CM | POA: Diagnosis not present

## 2021-12-05 DIAGNOSIS — M6259 Muscle wasting and atrophy, not elsewhere classified, multiple sites: Secondary | ICD-10-CM | POA: Diagnosis not present

## 2021-12-05 DIAGNOSIS — N419 Inflammatory disease of prostate, unspecified: Secondary | ICD-10-CM | POA: Diagnosis not present

## 2021-12-05 DIAGNOSIS — E039 Hypothyroidism, unspecified: Secondary | ICD-10-CM | POA: Diagnosis not present

## 2021-12-05 DIAGNOSIS — F0150 Vascular dementia without behavioral disturbance: Secondary | ICD-10-CM | POA: Diagnosis not present

## 2021-12-06 ENCOUNTER — Non-Acute Institutional Stay: Payer: PPO | Admitting: Primary Care

## 2021-12-06 DIAGNOSIS — Z515 Encounter for palliative care: Secondary | ICD-10-CM | POA: Diagnosis not present

## 2021-12-06 DIAGNOSIS — F0390 Unspecified dementia without behavioral disturbance: Secondary | ICD-10-CM

## 2021-12-06 DIAGNOSIS — I5032 Chronic diastolic (congestive) heart failure: Secondary | ICD-10-CM | POA: Diagnosis not present

## 2021-12-06 NOTE — Progress Notes (Signed)
? ? ?Manufacturing engineer ?[facility] Consult Note ?Telephone: [phone]  ?Fax: [phone]  ? ? ?Date of encounter: 12/06/21 ?1:00 PM ?PATIENT NAME: David Morgan ?[place] [address]   [phone] (home)  ?DOB: [birth year] ?MRN: [id number] ?PRIMARY CARE PROVIDER:    ?Rica Koyanagi, MD,  ?[address] ?[place] [address] ?[phone] ? ?REFERRING PROVIDER:   ?Rica Koyanagi, MD ?[address] ?[address] ?[place],  [place] [address] ?[phone] ? ?RESPONSIBLE PARTY:    ?Contact Information   ? ? Name Relation Home Work Mobile  ? David, Morgan Spouse [phone]    ? ?  ? ? ? ?[facility]. Palliative Care was asked to follow this patient by consultation request of  Rica Koyanagi, MD to address advance care planning and complex medical decision making. This is a follow up visit. ? ?                                 ASSESSMENT AND PLAN / RECOMMENDATIONS:  ? ?Advance Care Planning/Goals of Care: Goals include to maximize quality of life and symptom management. Patient/health care surrogate gave his/her permission to discuss.Our advance care planning conversation included a discussion about:    ?  ?Exploration of personal, cultural or spiritual beliefs that might influence medical decisions  ?Identification of a healthcare agent - wife  ?Wife reports he does eat 100% many times.  ?She desires that he be fed each meal as he cannot feed himself. ?Review of an  advance directive document . ?CODE STATUS: DNR ? ?Symptom Management/Plan: ? ?I met with patient in his nursing home room. He is now at fast stage 7D-E. He's no longer verbal and and while making eye contact he cannot make any needs known. He is completely bedbound. He was discharged off of hospice some months ago but his dementia is progressing and he is in the very later stages at this point. May revisit based on the parameters such as weight and intake.  He has lost 20 lbs (11%)  in 4-6 months, but intake was 75%. ? ?Patient needs to be fed each meal and offered supplements e.g. mighty shake. He also would like magic cup lunch  and dinner.  ? ?Follow up Palliative Care Visit: Palliative care will continue to follow for complex medical decision making, advance care planning, and clarification of goals. Return 6 weeks or prn. ? ?I spent 25 minutes providing this consultation. More than 50% of the time in this consultation was spent in counseling and care coordination. ? ?PPS: 30% ? ?HOSPICE ELIGIBILITY/DIAGNOSIS: yes, abnormal weight loss ? ?Chief Complaint: dementia, advanced ? ?HISTORY OF PRESENT ILLNESS:  David Morgan is a 88 y.o. year old male  with advanced dementia, weight loss . Patient seen today to review palliative care needs to include medical decision making and advance care planning as appropriate.  ? ?History obtained from review of EMR, discussion with primary team, and interview with family, facility staff/caregiver and/or David Morgan.  ?I reviewed available labs, medications, imaging, studies and related documents from the EMR.  Records reviewed and summarized above.  ? ?ROS ?/staff ? ?General: NAD ?ENMT: denies dysphagia ?Pulmonary: denies cough, denies increased SOB ?Abdomen: endorses good appetite, denies constipation, endorses incontinence of bowel ?GU: denies dysuria, endorses incontinence of urine ?MSK:  endorses increased weakness, no falls reported ?Skin: denies rashes or  wounds ?Neurological: denies pain, denies insomnia ?Psych: Endorses flat  mood ? ?Physical Exam: ?Current and past weights: 155 lbs, 11% loss in 4-6 months ?Constitutional: NAD ?General: frail appearing, thin ?EYES: anicteric sclera, lids intact, no discharge  ?ENMT: intact hearing, oral mucous membranes moist ?CV: RRR, no LE edema ?Pulmonary:  no increased work of breathing, no cough, room air ?Abdomen: intake 75%, soft and non tender, no ascites ?MSK: + sarcopenia,  moves all extremities,  non ambulatory, oob in w/c on occasion ?Skin: warm and dry, no rashes or wounds on visible skin ?Neuro:  + generalized weakness,  severe cognitive impairment, non-anxious affect ? ? ?Thank you for the opportunity to participate in the care of David Morgan.  The palliative care team will continue to follow. Please call our office at [phone] if we can be of additional assistance.  ? ?Jason Coop, NP DNP, AGPCNP-BC ? ?COVID-19 PATIENT SCREENING TOOL ?Asked and negative response unless otherwise noted:  ? ?Have you had symptoms of covid, tested positive or been in contact with someone with symptoms/positive test in the past 5-10 days?  ? ?

## 2021-12-11 DIAGNOSIS — G4701 Insomnia due to medical condition: Secondary | ICD-10-CM | POA: Diagnosis not present

## 2021-12-11 DIAGNOSIS — F01C18 Vascular dementia, severe, with other behavioral disturbance: Secondary | ICD-10-CM | POA: Diagnosis not present

## 2021-12-11 DIAGNOSIS — I69318 Other symptoms and signs involving cognitive functions following cerebral infarction: Secondary | ICD-10-CM | POA: Diagnosis not present

## 2021-12-16 DIAGNOSIS — F01518 Vascular dementia, unspecified severity, with other behavioral disturbance: Secondary | ICD-10-CM | POA: Diagnosis not present

## 2021-12-16 DIAGNOSIS — F0150 Vascular dementia without behavioral disturbance: Secondary | ICD-10-CM | POA: Diagnosis not present

## 2021-12-16 DIAGNOSIS — M6259 Muscle wasting and atrophy, not elsewhere classified, multiple sites: Secondary | ICD-10-CM | POA: Diagnosis not present

## 2021-12-16 DIAGNOSIS — G20 Parkinson's disease: Secondary | ICD-10-CM | POA: Diagnosis not present

## 2021-12-16 DIAGNOSIS — R627 Adult failure to thrive: Secondary | ICD-10-CM | POA: Diagnosis not present

## 2021-12-16 DIAGNOSIS — N419 Inflammatory disease of prostate, unspecified: Secondary | ICD-10-CM | POA: Diagnosis not present

## 2021-12-16 DIAGNOSIS — E785 Hyperlipidemia, unspecified: Secondary | ICD-10-CM | POA: Diagnosis not present

## 2022-01-01 DIAGNOSIS — G4701 Insomnia due to medical condition: Secondary | ICD-10-CM | POA: Diagnosis not present

## 2022-01-01 DIAGNOSIS — I69318 Other symptoms and signs involving cognitive functions following cerebral infarction: Secondary | ICD-10-CM | POA: Diagnosis not present

## 2022-01-01 DIAGNOSIS — F01C18 Vascular dementia, severe, with other behavioral disturbance: Secondary | ICD-10-CM | POA: Diagnosis not present

## 2022-01-23 DIAGNOSIS — R627 Adult failure to thrive: Secondary | ICD-10-CM | POA: Diagnosis not present

## 2022-01-23 DIAGNOSIS — J449 Chronic obstructive pulmonary disease, unspecified: Secondary | ICD-10-CM | POA: Diagnosis not present

## 2022-01-23 DIAGNOSIS — M6281 Muscle weakness (generalized): Secondary | ICD-10-CM | POA: Diagnosis not present

## 2022-01-23 DIAGNOSIS — N400 Enlarged prostate without lower urinary tract symptoms: Secondary | ICD-10-CM | POA: Diagnosis not present

## 2022-01-29 DIAGNOSIS — I5032 Chronic diastolic (congestive) heart failure: Secondary | ICD-10-CM | POA: Diagnosis not present

## 2022-01-29 DIAGNOSIS — E7800 Pure hypercholesterolemia, unspecified: Secondary | ICD-10-CM | POA: Diagnosis not present

## 2022-01-29 DIAGNOSIS — I10 Essential (primary) hypertension: Secondary | ICD-10-CM | POA: Diagnosis not present

## 2022-01-29 DIAGNOSIS — I350 Nonrheumatic aortic (valve) stenosis: Secondary | ICD-10-CM | POA: Diagnosis not present

## 2022-01-29 DIAGNOSIS — R4701 Aphasia: Secondary | ICD-10-CM | POA: Diagnosis not present

## 2022-01-29 DIAGNOSIS — F01C18 Vascular dementia, severe, with other behavioral disturbance: Secondary | ICD-10-CM | POA: Diagnosis not present

## 2022-01-29 DIAGNOSIS — I69318 Other symptoms and signs involving cognitive functions following cerebral infarction: Secondary | ICD-10-CM | POA: Diagnosis not present

## 2022-01-30 DIAGNOSIS — E119 Type 2 diabetes mellitus without complications: Secondary | ICD-10-CM | POA: Diagnosis not present

## 2022-01-30 DIAGNOSIS — M810 Age-related osteoporosis without current pathological fracture: Secondary | ICD-10-CM | POA: Diagnosis not present

## 2022-01-30 DIAGNOSIS — E559 Vitamin D deficiency, unspecified: Secondary | ICD-10-CM | POA: Diagnosis not present

## 2022-02-03 DIAGNOSIS — R627 Adult failure to thrive: Secondary | ICD-10-CM | POA: Diagnosis not present

## 2022-02-03 DIAGNOSIS — E039 Hypothyroidism, unspecified: Secondary | ICD-10-CM | POA: Diagnosis not present

## 2022-02-18 ENCOUNTER — Non-Acute Institutional Stay: Payer: PPO | Admitting: Primary Care

## 2022-02-18 DIAGNOSIS — F0390 Unspecified dementia without behavioral disturbance: Secondary | ICD-10-CM

## 2022-02-18 DIAGNOSIS — Z515 Encounter for palliative care: Secondary | ICD-10-CM | POA: Diagnosis not present

## 2022-02-18 NOTE — Progress Notes (Signed)
[facility] Consult Note Telephone: [phone]  Fax: [phone]    Date of encounter: 02/18/22 1:03 PM PATIENT NAME: David Morgan [address] [place] [address]   [phone] (home)  DOB: 1925/01/21 MRN: [id number] PRIMARY CARE PROVIDER:    [place]agi, MD,  [address] [place] [address] [phone]  REFERRING [place] [address] [place],  [place] [address] [phone]  RESPONSIBLE PARTY:    Contact Information     Name Relation Home Work Mobile   David Morgan Spouse [phone]        I [facility]. Palliative Care was asked to follow this patient by consultation request of  David Koyanagi, MD to address advance care planning and complex medical decision making. This is a follow up visit.                                   ASSESSMENT AND PLAN / RECOMMENDATIONS:   Advance Care Planning/Goals of Care: Goals include to maximize quality of life and symptom management.  CODE STATUS:DNR  Symptom Management/Plan:  I met with patient in his nursing home room. He was having lunch and is able to feed himself with his fork and spoon. He does need adapted utensils, and set up. I was able to assist him with these and a straw for his drink. He however, would need meal set up and , making sure that he has what he needs to feed himself. He was able to speak one sentence to me, but was not able to follow commands or answer questions. His weight has been stable and there are no concerns from staff at this time. We had discussed a readmission to hospice several months ago, but it is now my determination, that he no longer meets criteria.  I can continue to follow for palliative needs.   Follow up Palliative Care Visit: Palliative care will continue to follow for complex medical decision making, advance care planning, and  clarification of goals. Return 8 weeks or prn.  I spent 15 minutes providing this consultation. More than 50% of the time in this consultation was spent in counseling and care coordination.  PPS: 30%  HOSPICE ELIGIBILITY/DIAGNOSIS: no  Chief Complaint: dementia  HISTORY OF PRESENT ILLNESS:  David Morgan is a 88 y.o. year old male  with advanced dementia, immobility . Patient seen today to review palliative care needs to include medical decision making and advance care planning as appropriate.   History obtained from review of EMR, discussion with primary team, and interview with family, facility staff/caregiver and/or David Morgan.  I reviewed available labs, medications, imaging, studies and related documents from the EMR.  Records reviewed and summarized above.   ROS/staff  General: NAD ENMT: denies dysphagia Pulmonary: denies cough, denies increased SOB Abdomen: endorses good appetite, denies constipation, endorses incontinence of bowel GU: denies dysuria, endorses incontinence of urine MSK:  denies  increased weakness,  no falls reported Skin: denies rashes or wounds Neurological: denies pain, denies insomnia Psych: Endorses positive mood, min. verbal  Physical Exam: Current and past weights: stable 158 lbs Constitutional: NAD General: frail appearing EYES: anicteric sclera, lids intact, no discharge  ENMT: intact hearing, oral mucous membranes moist, dentition intact CV: no LE edema Pulmonary:  no increased work of breathing, no cough,  room air Abdomen: intake 70%,  no ascites MSK: + sarcopenia, moves all extremities,  non ambulatory Skin: warm and dry, no rashes or wounds on visible skin Neuro:  no new generalized weakness,  ++ cognitive impairment, non-anxious affect, FAST score 7 C   Thank you for the opportunity to participate in the care of David Morgan.  The palliative care team will continue to follow. Please call our office at [phone] if we can be of  additional assistance.   David Coop, NP DNP, AGPCNP-BC  COVID-19 PATIENT SCREENING TOOL Asked and negative response unless otherwise noted:   Have you had symptoms of covid, tested positive or been in contact with someone with symptoms/positive test in the past 5-10 days?

## 2022-02-26 DIAGNOSIS — F01C18 Vascular dementia, severe, with other behavioral disturbance: Secondary | ICD-10-CM | POA: Diagnosis not present

## 2022-02-26 DIAGNOSIS — I69318 Other symptoms and signs involving cognitive functions following cerebral infarction: Secondary | ICD-10-CM | POA: Diagnosis not present

## 2022-02-26 DIAGNOSIS — G4701 Insomnia due to medical condition: Secondary | ICD-10-CM | POA: Diagnosis not present

## 2022-03-05 DIAGNOSIS — E785 Hyperlipidemia, unspecified: Secondary | ICD-10-CM | POA: Diagnosis not present

## 2022-03-05 DIAGNOSIS — N419 Inflammatory disease of prostate, unspecified: Secondary | ICD-10-CM | POA: Diagnosis not present

## 2022-03-05 DIAGNOSIS — F0150 Vascular dementia without behavioral disturbance: Secondary | ICD-10-CM | POA: Diagnosis not present

## 2022-03-05 DIAGNOSIS — G20 Parkinson's disease: Secondary | ICD-10-CM | POA: Diagnosis not present

## 2022-03-05 DIAGNOSIS — R2681 Unsteadiness on feet: Secondary | ICD-10-CM | POA: Diagnosis not present

## 2022-03-05 DIAGNOSIS — E039 Hypothyroidism, unspecified: Secondary | ICD-10-CM | POA: Diagnosis not present

## 2022-03-18 DIAGNOSIS — G20 Parkinson's disease: Secondary | ICD-10-CM | POA: Diagnosis not present

## 2022-03-18 DIAGNOSIS — E039 Hypothyroidism, unspecified: Secondary | ICD-10-CM | POA: Diagnosis not present

## 2022-03-18 DIAGNOSIS — N419 Inflammatory disease of prostate, unspecified: Secondary | ICD-10-CM | POA: Diagnosis not present

## 2022-03-18 DIAGNOSIS — F0150 Vascular dementia without behavioral disturbance: Secondary | ICD-10-CM | POA: Diagnosis not present

## 2022-03-18 DIAGNOSIS — E785 Hyperlipidemia, unspecified: Secondary | ICD-10-CM | POA: Diagnosis not present

## 2022-03-18 DIAGNOSIS — R2681 Unsteadiness on feet: Secondary | ICD-10-CM | POA: Diagnosis not present

## 2022-03-24 DIAGNOSIS — M6281 Muscle weakness (generalized): Secondary | ICD-10-CM | POA: Diagnosis not present

## 2022-03-24 DIAGNOSIS — F01C3 Vascular dementia, severe, with mood disturbance: Secondary | ICD-10-CM | POA: Diagnosis not present

## 2022-03-24 DIAGNOSIS — E440 Moderate protein-calorie malnutrition: Secondary | ICD-10-CM | POA: Diagnosis not present

## 2022-03-24 DIAGNOSIS — J449 Chronic obstructive pulmonary disease, unspecified: Secondary | ICD-10-CM | POA: Diagnosis not present

## 2022-03-28 DIAGNOSIS — E119 Type 2 diabetes mellitus without complications: Secondary | ICD-10-CM | POA: Diagnosis not present

## 2022-03-28 DIAGNOSIS — I10 Essential (primary) hypertension: Secondary | ICD-10-CM | POA: Diagnosis not present

## 2022-04-02 DIAGNOSIS — F01C18 Vascular dementia, severe, with other behavioral disturbance: Secondary | ICD-10-CM | POA: Diagnosis not present

## 2022-04-02 DIAGNOSIS — I69318 Other symptoms and signs involving cognitive functions following cerebral infarction: Secondary | ICD-10-CM | POA: Diagnosis not present

## 2022-04-03 DIAGNOSIS — Z79899 Other long term (current) drug therapy: Secondary | ICD-10-CM | POA: Diagnosis not present

## 2022-04-30 DIAGNOSIS — I69318 Other symptoms and signs involving cognitive functions following cerebral infarction: Secondary | ICD-10-CM | POA: Diagnosis not present

## 2022-04-30 DIAGNOSIS — F01C18 Vascular dementia, severe, with other behavioral disturbance: Secondary | ICD-10-CM | POA: Diagnosis not present

## 2022-05-09 DIAGNOSIS — E440 Moderate protein-calorie malnutrition: Secondary | ICD-10-CM | POA: Diagnosis not present

## 2022-05-09 DIAGNOSIS — L89502 Pressure ulcer of unspecified ankle, stage 2: Secondary | ICD-10-CM | POA: Diagnosis not present

## 2022-05-09 DIAGNOSIS — L89601 Pressure ulcer of unspecified heel, stage 1: Secondary | ICD-10-CM | POA: Diagnosis not present

## 2022-05-09 DIAGNOSIS — I739 Peripheral vascular disease, unspecified: Secondary | ICD-10-CM | POA: Diagnosis not present

## 2022-05-16 ENCOUNTER — Non-Acute Institutional Stay: Payer: PPO | Admitting: Primary Care

## 2022-05-16 DIAGNOSIS — Z515 Encounter for palliative care: Secondary | ICD-10-CM | POA: Diagnosis not present

## 2022-05-16 DIAGNOSIS — F0390 Unspecified dementia without behavioral disturbance: Secondary | ICD-10-CM | POA: Diagnosis not present

## 2022-05-16 NOTE — Progress Notes (Signed)
[facility] Consult Note Telephone: [phone]  Fax: [phone]    Date of encounter: 05/16/22 12:47 PM PATIENT NAME: David Morgan [address] [place] [address]   [phone] (home)  DOB: November 25, 1924 MRN: [id number] PRIMARY CARE PROVIDER:    Rica Koyanagi, MD,  [address] [place] [address] [phone]  REFERRING [place] [address] [place],  [place] [address] [phone]  RESPONSIBLE PARTY:    Contact Information     Name Relation Home Work Mobile   [place] Spouse [phone]          I [facility]. Palliative Care was asked to follow this patient by consultation request of  David Koyanagi, MD to address advance care planning and complex medical decision making. This is a follow up visit.                                   ASSESSMENT AND PLAN / RECOMMENDATIONS:   Advance Care Planning/Goals of Care: Goals include to maximize quality of life and symptom management. CODE STATUS: DNR  Symptom Management/Plan:   Patient min. Verbal due to disease process. States he is cold. Able to answer yes/ no today.Appears at his baseline, weight stable. Meds reviewed. Being Rx for foot wound. Albumin WNL.   Follow up Palliative Care Visit: Palliative care will continue to follow for complex medical decision making, advance care planning, and clarification of goals. Return 8 weeks or prn.  I spent 15 minutes providing this consultation. More than 50% of the time in this consultation was spent in counseling and care coordination.  PPS: 30%  HOSPICE ELIGIBILITY/DIAGNOSIS: TBD  Chief Complaint: dementia, debility  HISTORY OF PRESENT ILLNESS:  David Morgan is a 88 y.o. year old male  with dementia, debility . Patient seen today to review palliative care needs to include medical decision making and advance  care planning as appropriate.   History obtained from review of EMR, discussion with primary team, and interview with family, facility staff/caregiver and/or David Morgan.  I reviewed available labs, medications, imaging, studies and related documents from the EMR.  Records reviewed and summarized above.   ROS   General: NAD ENMT: denies dysphagia Cardiovascular: denies chest pain, denies DOE Pulmonary: denies cough, denies increased SOB Abdomen: endorses good appetite, denies constipation, endorses incontinence of bowel GU: denies dysuria, endorses incontinence of urine MSK:  denies  increased weakness,  no falls reported Skin: denies rashes or wounds Neurological: denies pain, denies insomnia Psych: Endorses positive mood  Physical Exam: Current and past weights: stable  Constitutional: NAD General: frail appearing, wnwd  EYES: anicteric sclera, lids intact, no discharge  ENMT: intact hearing, oral mucous membranes moist, dentition intact CV:  no LE edema Pulmonary:  no increased work of breathing, no cough, room air Abdomen: intake 70%, soft and non tender, no ascites MSK: + sarcopenia, moves all extremities,  non ambulatory Skin: warm and dry, no rashes , + foot wounds reported Neuro: ++  generalized weakness,  ++ cognitive impairment, non-anxious affect   Thank you for the opportunity to participate in the care of David Morgan.  The palliative care team will continue to follow. Please call our office at [phone] if we can be of additional assistance.   David Coop, NP DNP, AGPCNP-BC  COVID-19 PATIENT  SCREENING TOOL Asked and negative response unless otherwise noted:   Have you had symptoms of covid, tested positive or been in contact with someone with symptoms/positive test in the past 5-10 days?

## 2022-05-22 DIAGNOSIS — L89502 Pressure ulcer of unspecified ankle, stage 2: Secondary | ICD-10-CM | POA: Diagnosis not present

## 2022-05-22 DIAGNOSIS — R54 Age-related physical debility: Secondary | ICD-10-CM | POA: Diagnosis not present

## 2022-05-22 DIAGNOSIS — L89512 Pressure ulcer of right ankle, stage 2: Secondary | ICD-10-CM | POA: Diagnosis not present

## 2022-05-22 DIAGNOSIS — L89601 Pressure ulcer of unspecified heel, stage 1: Secondary | ICD-10-CM | POA: Diagnosis not present

## 2022-05-28 DIAGNOSIS — I739 Peripheral vascular disease, unspecified: Secondary | ICD-10-CM | POA: Diagnosis not present

## 2022-05-28 DIAGNOSIS — M6281 Muscle weakness (generalized): Secondary | ICD-10-CM | POA: Diagnosis not present

## 2022-05-28 DIAGNOSIS — L603 Nail dystrophy: Secondary | ICD-10-CM | POA: Diagnosis not present

## 2022-05-28 DIAGNOSIS — F01C18 Vascular dementia, severe, with other behavioral disturbance: Secondary | ICD-10-CM | POA: Diagnosis not present

## 2022-05-28 DIAGNOSIS — R627 Adult failure to thrive: Secondary | ICD-10-CM | POA: Diagnosis not present

## 2022-05-28 DIAGNOSIS — R293 Abnormal posture: Secondary | ICD-10-CM | POA: Diagnosis not present

## 2022-05-28 DIAGNOSIS — F01518 Vascular dementia, unspecified severity, with other behavioral disturbance: Secondary | ICD-10-CM | POA: Diagnosis not present

## 2022-05-28 DIAGNOSIS — N419 Inflammatory disease of prostate, unspecified: Secondary | ICD-10-CM | POA: Diagnosis not present

## 2022-05-28 DIAGNOSIS — I69318 Other symptoms and signs involving cognitive functions following cerebral infarction: Secondary | ICD-10-CM | POA: Diagnosis not present

## 2022-05-28 DIAGNOSIS — E785 Hyperlipidemia, unspecified: Secondary | ICD-10-CM | POA: Diagnosis not present

## 2022-06-03 DIAGNOSIS — E039 Hypothyroidism, unspecified: Secondary | ICD-10-CM | POA: Diagnosis not present

## 2022-06-04 DIAGNOSIS — L89601 Pressure ulcer of unspecified heel, stage 1: Secondary | ICD-10-CM | POA: Diagnosis not present

## 2022-06-04 DIAGNOSIS — L89512 Pressure ulcer of right ankle, stage 2: Secondary | ICD-10-CM | POA: Diagnosis not present

## 2022-06-04 DIAGNOSIS — E039 Hypothyroidism, unspecified: Secondary | ICD-10-CM | POA: Diagnosis not present

## 2022-06-13 DIAGNOSIS — M24541 Contracture, right hand: Secondary | ICD-10-CM | POA: Diagnosis not present

## 2022-06-18 DIAGNOSIS — R293 Abnormal posture: Secondary | ICD-10-CM | POA: Diagnosis not present

## 2022-06-18 DIAGNOSIS — R627 Adult failure to thrive: Secondary | ICD-10-CM | POA: Diagnosis not present

## 2022-06-18 DIAGNOSIS — M6281 Muscle weakness (generalized): Secondary | ICD-10-CM | POA: Diagnosis not present

## 2022-06-18 DIAGNOSIS — F01518 Vascular dementia, unspecified severity, with other behavioral disturbance: Secondary | ICD-10-CM | POA: Diagnosis not present

## 2022-06-25 DIAGNOSIS — I69318 Other symptoms and signs involving cognitive functions following cerebral infarction: Secondary | ICD-10-CM | POA: Diagnosis not present

## 2022-06-25 DIAGNOSIS — F01C18 Vascular dementia, severe, with other behavioral disturbance: Secondary | ICD-10-CM | POA: Diagnosis not present

## 2022-06-25 DIAGNOSIS — G4701 Insomnia due to medical condition: Secondary | ICD-10-CM | POA: Diagnosis not present

## 2022-07-25 DIAGNOSIS — R54 Age-related physical debility: Secondary | ICD-10-CM | POA: Diagnosis not present

## 2022-07-25 DIAGNOSIS — L89512 Pressure ulcer of right ankle, stage 2: Secondary | ICD-10-CM | POA: Diagnosis not present

## 2022-07-30 DIAGNOSIS — F01C18 Vascular dementia, severe, with other behavioral disturbance: Secondary | ICD-10-CM | POA: Diagnosis not present

## 2022-07-30 DIAGNOSIS — I69318 Other symptoms and signs involving cognitive functions following cerebral infarction: Secondary | ICD-10-CM | POA: Diagnosis not present

## 2022-07-31 ENCOUNTER — Non-Acute Institutional Stay: Payer: PPO | Admitting: Nurse Practitioner

## 2022-07-31 ENCOUNTER — Encounter: Payer: Self-pay | Admitting: Nurse Practitioner

## 2022-07-31 DIAGNOSIS — I5032 Chronic diastolic (congestive) heart failure: Secondary | ICD-10-CM | POA: Diagnosis not present

## 2022-07-31 DIAGNOSIS — E039 Hypothyroidism, unspecified: Secondary | ICD-10-CM | POA: Diagnosis not present

## 2022-07-31 DIAGNOSIS — R5381 Other malaise: Secondary | ICD-10-CM

## 2022-07-31 DIAGNOSIS — F0390 Unspecified dementia without behavioral disturbance: Secondary | ICD-10-CM

## 2022-07-31 DIAGNOSIS — H04123 Dry eye syndrome of bilateral lacrimal glands: Secondary | ICD-10-CM | POA: Diagnosis not present

## 2022-07-31 DIAGNOSIS — Z515 Encounter for palliative care: Secondary | ICD-10-CM

## 2022-07-31 DIAGNOSIS — L89512 Pressure ulcer of right ankle, stage 2: Secondary | ICD-10-CM | POA: Diagnosis not present

## 2022-07-31 NOTE — Progress Notes (Addendum)
[facility] Consult Note Telephone: [phone]  Fax: [phone]    Date of encounter: 07/31/22 10:44 PM PATIENT NAME: David Morgan [address] [place] [address]   [phone] (home)  DOB: [birth year] MRN: [id number] PRIMARY CARE PROVIDER:    [facility]  RESPONSIBLE PARTY:    Contact Information     Name Relation Home Work Mobile   [place] Spouse [phone]       I met face to face with David Morgan in facility. Palliative Care was asked to follow this patient by consultation request of  Rica Koyanagi, MD to address advance care planning and complex medical decision making. This is a follow up visit.                                  ASSESSMENT AND PLAN / RECOMMENDATIONS:  Symptom Management/Plan: 1. Advance Care Planning;  DNR 2. Goals of Care: Goals include to maximize quality of life and symptom management. Our advance care planning conversation included a discussion about:    The value and importance of advance care planning  Exploration of personal, cultural or spiritual beliefs that might influence medical decisions  Exploration of goals of care in the event of a sudden injury or illness  Identification and preparation of a healthcare agent  Review and updating or creation of an advance directive document. 3. Palliative care encounter; Palliative care encounter; Palliative medicine team will continue to support patient, patient's family, and medical team. Visit consisted of counseling and education dealing with the complex and emotionally intense issues of symptom management and palliative care in the setting of serious and potentially life-threatening illness 4. Debility secondary to CHF, COPD encourage turn/positioning, monitor skin, monitor respiratory status, weights, encourage nutrition, oob as able 5. Dementia, progressive, ongoing monitoring, supportive role. Monitor nutrition, weights Follow  up Palliative Care Visit: Palliative care will continue to follow for complex medical decision making, advance care planning, and clarification of goals. Return  4 to 8 weeks or prn.   I spent 35 minutes providing this consultation. More than 50% of the time in this consultation was spent in counseling and care coordination.   PPS: 30% Chief Complaint: Follow up palliative consult for complex medical decision making, address goals, manage ongoing symptoms HISTORY OF PRESENT ILLNESS:  [name] is a 88 y.o. year old male  with multiple medical problems including Dementia, diastolic heart failure, COPD, h/o pancreatitis, h/o choledocholithiasis, HTN, DM, disease of thyroid, BPH, HLD. David Morgan resides at [facility], requires assistance with transfers, mobility, adl's including bathing, dressing, incontinence. David Morgan requires assistance with feeding with fair appetite, weight not available. Staff endorses no new changes or concerns. At present David Morgan is lying in bed, appears comfortable, debilitated. No visitors present. I visited and observed David Morgan. David Morgan made eye contact, mumbled words with few clear though limited with cognitive impairment. David Morgan was cooperative with assessment. Support provided, most of pc visit supportive, attempted to contact rp, medical goals, poc, medications reviewed, no new changes recommended today, updated staff.    History obtained from review of EMR, discussion with primary team, and interview with family, facility staff/caregiver and/or David. Morgan.  I reviewed available labs, medications, imaging, studies and related documents from the EMR.  Records reviewed and summarized above.    ROS 10 point system reviewed all negative except HPI  Physical Exam: Constitutional: NAD General: frail appearing, wnwd  ENMT: oral mucous membranes moist CV:  RRR Pulmonary:  breath sounds clear, decreased bases Abdomen: soft and non  tender, bs x 4 MSK: non ambulatory Skin: warm and dry Neuro: ++  generalized weakness Psych: + cognitive impairment  Thank you for the opportunity to participate in the care of David. Morgan. Please call our office at [phone] if we can be of additional assistance.   Loyal Holzheimer Ihor Gully, NP

## 2022-08-13 DIAGNOSIS — I739 Peripheral vascular disease, unspecified: Secondary | ICD-10-CM | POA: Diagnosis not present

## 2022-08-13 DIAGNOSIS — E785 Hyperlipidemia, unspecified: Secondary | ICD-10-CM | POA: Diagnosis not present

## 2022-08-13 DIAGNOSIS — F0150 Vascular dementia without behavioral disturbance: Secondary | ICD-10-CM | POA: Diagnosis not present

## 2022-08-13 DIAGNOSIS — G20C Parkinsonism, unspecified: Secondary | ICD-10-CM | POA: Diagnosis not present

## 2022-08-13 DIAGNOSIS — M24531 Contracture, right wrist: Secondary | ICD-10-CM | POA: Diagnosis not present

## 2022-08-13 DIAGNOSIS — N419 Inflammatory disease of prostate, unspecified: Secondary | ICD-10-CM | POA: Diagnosis not present

## 2022-08-18 DIAGNOSIS — M24531 Contracture, right wrist: Secondary | ICD-10-CM | POA: Diagnosis not present

## 2022-08-18 DIAGNOSIS — G20C Parkinsonism, unspecified: Secondary | ICD-10-CM | POA: Diagnosis not present

## 2022-08-18 DIAGNOSIS — F0150 Vascular dementia without behavioral disturbance: Secondary | ICD-10-CM | POA: Diagnosis not present

## 2022-08-18 DIAGNOSIS — N419 Inflammatory disease of prostate, unspecified: Secondary | ICD-10-CM | POA: Diagnosis not present

## 2022-08-18 DIAGNOSIS — E785 Hyperlipidemia, unspecified: Secondary | ICD-10-CM | POA: Diagnosis not present

## 2022-08-18 DIAGNOSIS — I739 Peripheral vascular disease, unspecified: Secondary | ICD-10-CM | POA: Diagnosis not present

## 2022-08-29 DIAGNOSIS — H16213 Exposure keratoconjunctivitis, bilateral: Secondary | ICD-10-CM | POA: Diagnosis not present

## 2022-08-29 DIAGNOSIS — E119 Type 2 diabetes mellitus without complications: Secondary | ICD-10-CM | POA: Diagnosis not present

## 2022-08-29 DIAGNOSIS — Z961 Presence of intraocular lens: Secondary | ICD-10-CM | POA: Diagnosis not present

## 2022-09-03 DIAGNOSIS — G4701 Insomnia due to medical condition: Secondary | ICD-10-CM | POA: Diagnosis not present

## 2022-09-03 DIAGNOSIS — I69318 Other symptoms and signs involving cognitive functions following cerebral infarction: Secondary | ICD-10-CM | POA: Diagnosis not present

## 2022-09-03 DIAGNOSIS — F01C18 Vascular dementia, severe, with other behavioral disturbance: Secondary | ICD-10-CM | POA: Diagnosis not present

## 2022-09-03 DIAGNOSIS — F66 Other sexual disorders: Secondary | ICD-10-CM | POA: Diagnosis not present

## 2022-09-08 DIAGNOSIS — I739 Peripheral vascular disease, unspecified: Secondary | ICD-10-CM | POA: Diagnosis not present

## 2022-09-08 DIAGNOSIS — L603 Nail dystrophy: Secondary | ICD-10-CM | POA: Diagnosis not present

## 2022-09-16 ENCOUNTER — Non-Acute Institutional Stay: Payer: PPO | Admitting: Nurse Practitioner

## 2022-09-16 ENCOUNTER — Encounter: Payer: Self-pay | Admitting: Nurse Practitioner

## 2022-09-16 DIAGNOSIS — Z515 Encounter for palliative care: Secondary | ICD-10-CM

## 2022-09-16 DIAGNOSIS — F0390 Unspecified dementia without behavioral disturbance: Secondary | ICD-10-CM | POA: Diagnosis not present

## 2022-09-16 DIAGNOSIS — R5381 Other malaise: Secondary | ICD-10-CM | POA: Diagnosis not present

## 2022-09-16 NOTE — Progress Notes (Signed)
[facility] Consult Note Telephone: [phone]  Fax: [phone]    Date of encounter: 09/16/22 5:42 PM PATIENT NAME: David Morgan [address] [place] [address]   [phone] (home)  DOB: [birth year] MRN: [id number] PRIMARY CARE PROVIDER:    [facility]   RESPONSIBLE PARTY:    Contact Information       Name Relation Home Work Mobile    David Morgan Spouse [phone]           I met face to face with David Morgan in facility. Palliative Care was asked to follow this patient by consultation request of  Rica Koyanagi, MD to address advance care planning and complex medical decision making. This is a follow up visit.                                  ASSESSMENT AND PLAN / RECOMMENDATIONS:  Symptom Management/Plan: 1. Advance Care Planning;  DNR 2. Goals of Care: Goals include to maximize quality of life and symptom management. Our advance care planning conversation included a discussion about:    The value and importance of advance care planning  Exploration of personal, cultural or spiritual beliefs that might influence medical decisions  Exploration of goals of care in the event of a sudden injury or illness  Identification and preparation of a healthcare agent  Review and updating or creation of an advance directive document. 3. Palliative care encounter; Palliative care encounter; Palliative medicine team will continue to support patient, patient's family, and medical team. Visit consisted of counseling and education dealing with the complex and emotionally intense issues of symptom management and palliative care in the setting of serious and potentially life-threatening illness 4. Debility secondary to CHF, COPD encourage turn/positioning, monitor skin, monitor respiratory status, weights, encourage nutrition, oob as able 5. Dementia, progressive, ongoing monitoring, supportive role. Monitor nutrition,  weights Follow up Palliative Care Visit: Palliative care will continue to follow for complex medical decision making, advance care planning, and clarification of goals. Return  4 to 8 weeks or prn.   I spent 45 minutes providing this consultation starting at 10:30 am. More than 50% of the time in this consultation was spent in counseling and care coordination.   PPS: 30% Chief Complaint: Follow up palliative consult for complex medical decision making, address goals, manage ongoing symptoms HISTORY OF PRESENT ILLNESS:  David Morgan is a 89 y.o. year old male  with multiple medical problems including Dementia, diastolic heart failure, COPD, h/o pancreatitis, h/o choledocholithiasis, HTN, DM, disease of thyroid, BPH, HLD. David Morgan resides at [facility], requires assistance with transfers, mobility, adl's including bathing, dressing, incontinence. David Morgan requires assistance with feeding with fair appetite, weight not available. Staff endorses no new changes or concerns. Purpose of today PC f/u visit further discussion monitor trends of appetite, weights, monitor for functional, cognitive decline with chronic disease progression, assess any active symptoms, supportive role.At present David Morgan is lying in bed, appears comfortable, debilitated. No visitors present.    PC f/u visit further discussion monitor trends of appetite, weights, monitor for functional, cognitive decline with chronic disease progression, assess any active symptoms, supportive role.Support provided, most of pc visit supportive, attempted to contact rp, medical goals, poc, medications reviewed, no new changes recommended today, updated staff.    History obtained from review of EMR, discussion with primary team, and interview  with family, facility staff/caregiver and/or David. Morgan.  I reviewed available labs, medications, imaging, studies and related documents from the EMR.  Records reviewed and summarized above.     Physical Exam: Constitutional: NAD General: frail appearing, wnwd  ENMT: oral mucous membranes moist CV:  RRR Pulmonary:  breath sounds clear, decreased bases Abdomen: soft and non tender, bs x 4 MSK: non ambulatory Skin: warm and dry Neuro: ++  generalized weakness Psych: + cognitive impairment  Thank you for the opportunity to participate in the care of David. Morgan. Please call our office at [phone] if we can be of additional assistance.   Naria Abbey Ihor Gully, NP

## 2022-09-18 DIAGNOSIS — N400 Enlarged prostate without lower urinary tract symptoms: Secondary | ICD-10-CM | POA: Diagnosis not present

## 2022-09-18 DIAGNOSIS — J449 Chronic obstructive pulmonary disease, unspecified: Secondary | ICD-10-CM | POA: Diagnosis not present

## 2022-09-18 DIAGNOSIS — F01C3 Vascular dementia, severe, with mood disturbance: Secondary | ICD-10-CM | POA: Diagnosis not present

## 2022-09-18 DIAGNOSIS — E039 Hypothyroidism, unspecified: Secondary | ICD-10-CM | POA: Diagnosis not present

## 2022-09-25 DIAGNOSIS — I10 Essential (primary) hypertension: Secondary | ICD-10-CM | POA: Diagnosis not present

## 2022-10-01 DIAGNOSIS — R451 Restlessness and agitation: Secondary | ICD-10-CM | POA: Diagnosis not present

## 2022-10-01 DIAGNOSIS — G4701 Insomnia due to medical condition: Secondary | ICD-10-CM | POA: Diagnosis not present

## 2022-10-01 DIAGNOSIS — F66 Other sexual disorders: Secondary | ICD-10-CM | POA: Diagnosis not present

## 2022-10-01 DIAGNOSIS — I69318 Other symptoms and signs involving cognitive functions following cerebral infarction: Secondary | ICD-10-CM | POA: Diagnosis not present

## 2022-10-01 DIAGNOSIS — F01C18 Vascular dementia, severe, with other behavioral disturbance: Secondary | ICD-10-CM | POA: Diagnosis not present

## 2022-10-17 ENCOUNTER — Non-Acute Institutional Stay: Payer: PPO | Admitting: Nurse Practitioner

## 2022-10-17 ENCOUNTER — Encounter: Payer: Self-pay | Admitting: Nurse Practitioner

## 2022-10-17 DIAGNOSIS — Z515 Encounter for palliative care: Secondary | ICD-10-CM

## 2022-10-17 DIAGNOSIS — I5032 Chronic diastolic (congestive) heart failure: Secondary | ICD-10-CM

## 2022-10-17 DIAGNOSIS — R5381 Other malaise: Secondary | ICD-10-CM | POA: Diagnosis not present

## 2022-10-17 DIAGNOSIS — F0390 Unspecified dementia without behavioral disturbance: Secondary | ICD-10-CM

## 2022-10-17 NOTE — Progress Notes (Signed)
[facility] Consult Note Telephone: [phone]  Fax: [phone]    Date of encounter: 10/17/22 7:45 PM PATIENT NAME: David Morgan [id number] [place] [address]   [phone] (home)  DOB: [birth year] MRN: [id number] PRIMARY CARE PROVIDER:    [facility]  RESPONSIBLE PARTY:    Contact Information     Name Relation Home Work Mobile   [place] Spouse [phone]          I met face to face with David Morgan in facility. Palliative Care was asked to follow this patient by consultation request of  Rica Koyanagi, MD to address advance care planning and complex medical decision making. This is a follow up visit.                                  ASSESSMENT AND PLAN / RECOMMENDATIONS:  Symptom Management/Plan: 1. Advance Care Planning;  DNR 2. Goals of Care: Goals include to maximize quality of life and symptom management. Our advance care planning conversation included a discussion about:    The value and importance of advance care planning  Exploration of personal, cultural or spiritual beliefs that might influence medical decisions  Exploration of goals of care in the event of a sudden injury or illness  Identification and preparation of a healthcare agent  Review and updating or creation of an advance directive document. 3. Palliative care encounter; Palliative care encounter; Palliative medicine team will continue to support patient, patient's family, and medical team. Visit consisted of counseling and education dealing with the complex and emotionally intense issues of symptom management and palliative care in the setting of serious and potentially life-threatening illness 4. Debility secondary to CHF, COPD encourage turn/positioning, monitor skin, monitor respiratory status, weights, encourage nutrition, oob as able 5. Dementia, progressive, ongoing monitoring, supportive role. Monitor nutrition,  weights Follow up Palliative Care Visit: Palliative care will continue to follow for complex medical decision making, advance care planning, and clarification of goals. Return  4 to 8 weeks or prn.   I spent 47 minutes providing this consultation. More than 50% of the time in this consultation was spent in counseling and care coordination.   PPS: 30% Chief Complaint: Follow up palliative consult for complex medical decision making, address goals, manage ongoing symptoms HISTORY OF PRESENT ILLNESS:  KUSHAL MAGOON is a 89 y.o. year old male  with multiple medical problems including Dementia, diastolic heart failure, COPD, h/o pancreatitis, h/o choledocholithiasis, HTN, DM, disease of thyroid, BPH, HLD. David Morgan resides at [facility], requires assistance with transfers, mobility, adl's including bathing, dressing, incontinence. David Morgan requires assistance with feeding with fair appetite, weight not available. Staff endorses no new changes or concerns. Purpose of today PC f/u visit further discussion monitor trends of appetite, weights, monitor for functional, cognitive decline with chronic disease progression, assess any active symptoms, supportive role.At present David Morgan is lying in bed, appears comfortable, debilitated. No visitors present.    PC f/u visit further discussion monitor trends of appetite, weights, monitor for functional, cognitive decline with chronic disease progression, assess any active symptoms, supportive role.Support provided, most of pc visit supportive, attempted to contact rp, medical goals, poc, medications reviewed, no new changes recommended today, updated staff.    History obtained from review of EMR, discussion with primary team, and interview with family, facility staff/caregiver and/or David Morgan.  I  reviewed available labs, medications, imaging, studies and related documents from the EMR.  Records reviewed and summarized above.    Physical  Exam: Constitutional: NAD General: frail appearing, wnwd  ENMT: oral mucous membranes moist CV:  RRR Pulmonary:  breath sounds clear, decreased bases Abdomen: soft and non tender, bs x 4 MSK: non ambulatory Skin: warm and dry Neuro: ++  generalized weakness Psych: + cognitive impairment Thank you for the opportunity to participate in the care of David. Morgan. Please call our office at [phone] if we can be of additional assistance.   Nolen Lindamood Ihor Gully, NP

## 2022-10-29 DIAGNOSIS — F01C18 Vascular dementia, severe, with other behavioral disturbance: Secondary | ICD-10-CM | POA: Diagnosis not present

## 2022-10-29 DIAGNOSIS — R451 Restlessness and agitation: Secondary | ICD-10-CM | POA: Diagnosis not present

## 2022-10-29 DIAGNOSIS — I69318 Other symptoms and signs involving cognitive functions following cerebral infarction: Secondary | ICD-10-CM | POA: Diagnosis not present

## 2022-10-29 DIAGNOSIS — F66 Other sexual disorders: Secondary | ICD-10-CM | POA: Diagnosis not present

## 2022-10-29 DIAGNOSIS — G4701 Insomnia due to medical condition: Secondary | ICD-10-CM | POA: Diagnosis not present

## 2022-11-06 DIAGNOSIS — G20C Parkinsonism, unspecified: Secondary | ICD-10-CM | POA: Diagnosis not present

## 2022-11-06 DIAGNOSIS — F0150 Vascular dementia without behavioral disturbance: Secondary | ICD-10-CM | POA: Diagnosis not present

## 2022-11-17 DIAGNOSIS — E039 Hypothyroidism, unspecified: Secondary | ICD-10-CM | POA: Diagnosis not present

## 2022-11-17 DIAGNOSIS — Z79899 Other long term (current) drug therapy: Secondary | ICD-10-CM | POA: Diagnosis not present

## 2022-11-17 DIAGNOSIS — N400 Enlarged prostate without lower urinary tract symptoms: Secondary | ICD-10-CM | POA: Diagnosis not present

## 2022-11-17 DIAGNOSIS — J449 Chronic obstructive pulmonary disease, unspecified: Secondary | ICD-10-CM | POA: Diagnosis not present

## 2022-11-17 DIAGNOSIS — M79606 Pain in leg, unspecified: Secondary | ICD-10-CM | POA: Diagnosis not present

## 2022-11-17 DIAGNOSIS — E559 Vitamin D deficiency, unspecified: Secondary | ICD-10-CM | POA: Diagnosis not present

## 2022-11-17 DIAGNOSIS — F0150 Vascular dementia without behavioral disturbance: Secondary | ICD-10-CM | POA: Diagnosis not present

## 2022-11-21 DIAGNOSIS — E118 Type 2 diabetes mellitus with unspecified complications: Secondary | ICD-10-CM | POA: Diagnosis not present

## 2022-11-21 DIAGNOSIS — E559 Vitamin D deficiency, unspecified: Secondary | ICD-10-CM | POA: Diagnosis not present

## 2022-11-24 ENCOUNTER — Non-Acute Institutional Stay: Payer: PPO | Admitting: Nurse Practitioner

## 2022-11-24 ENCOUNTER — Encounter: Payer: Self-pay | Admitting: Nurse Practitioner

## 2022-11-24 DIAGNOSIS — Z515 Encounter for palliative care: Secondary | ICD-10-CM

## 2022-11-24 DIAGNOSIS — F0390 Unspecified dementia without behavioral disturbance: Secondary | ICD-10-CM

## 2022-11-24 DIAGNOSIS — R5381 Other malaise: Secondary | ICD-10-CM

## 2022-11-24 DIAGNOSIS — I5032 Chronic diastolic (congestive) heart failure: Secondary | ICD-10-CM

## 2022-11-24 NOTE — Progress Notes (Signed)
Therapist, nutritional Palliative Care Consult Note Telephone: [phone]  Fax: [phone]    Date of encounter: 11/24/22 2:59 PM PATIENT NAME: David Morgan [address] [place] [address]   [phone] (home)  DOB: 04/03/1925 MRN: [id number] PRIMARY CARE PROVIDER:    [facility]  RESPONSIBLE PARTY:    Contact Information     Name Relation Home Work Mobile   Chakraborty,David Spouse [phone]          I met face to face with David Morgan in facility. Palliative Care was asked to follow this patient by consultation request of  [name], MD to address advance care planning and complex medical decision making. This is a follow up visit.                                  ASSESSMENT AND PLAN / RECOMMENDATIONS:  Symptom Management/Plan: 1. Advance Care Planning;  DNR 2. Goals of Care: Goals include to maximize quality of life and symptom management. Our advance care planning conversation included a discussion about:    The value and importance of advance care planning  Exploration of personal, cultural or spiritual beliefs that might influence medical decisions  Exploration of goals of care in the event of a sudden injury or illness  Identification and preparation of a healthcare agent  Review and updating or creation of an advance directive document. 3. Palliative care encounter; Palliative care encounter; Palliative medicine team will continue to support patient, patient's family, and medical team. Visit consisted of counseling and education dealing with the complex and emotionally intense issues of symptom management and palliative care in the setting of serious and potentially life-threatening illness 4. Debility secondary to CHF, COPD encourage turn/positioning, monitor skin, monitor respiratory status, weights, encourage nutrition, oob as able 5. Dementia, progressive, ongoing monitoring, supportive role. Monitor nutrition,  weights 08/20/2022 weight 156.8 lbs 10/15/2022 weight 160.2 lbs 11/19/2022 weight 159.4 lbs Follow up Palliative Care Visit: Palliative care will continue to follow for complex medical decision making, advance care planning, and clarification of goals. Return  4 to 8 weeks or prn.   I spent 45 minutes providing this consultation. More than 50% of the time in this consultation was spent in counseling and care coordination.   PPS: 30% Chief Complaint: Follow up palliative consult for complex medical decision making, address goals, manage ongoing symptoms HISTORY OF PRESENT ILLNESS:  David Morgan is a 89 y.o. year old male  with multiple medical problems including Dementia, diastolic heart failure, COPD, h/o pancreatitis, h/o choledocholithiasis, HTN, DM, disease of thyroid, BPH, HLD. David Morgan resides at [facility], requires assistance with transfers, mobility, adl's including bathing, dressing, incontinence. David Morgan requires assistance with feeding with fair appetite, weight not available. Staff endorses no new changes or concerns. Purpose of today PC f/u visit further discussion monitor trends of appetite, weights, monitor for functional, cognitive decline with chronic disease progression, assess any active symptoms, supportive role.At present David Morgan is lying in bed, appears comfortable, debilitated. No visitors present. David Morgan is sleeping, awoke to verbal cues, few words though limited with cognitive impairment. David Morgan was cooperative with assessment. Medications, poc, goc weights, discussed with staff, no new changes. No recent falls, wounds, hospitalizations, infections. Continue to be followed by pc, updated staff.    History obtained from review of EMR, discussion with primary team, and interview with family,  facility staff/caregiver and/or David. Morgan.  I reviewed available labs, medications, imaging, studies and related documents from the EMR.  Records reviewed and  summarized above.    Physical Exam: General: frail appearing, wnwd  ENMT: oral mucous membranes moist CV:  RRR Pulmonary:  breath sounds clear, decreased bases MSK: non ambulatory Neuro: +  generalized weakness Psych: + cognitive impairment Thank you for the opportunity to participate in the care of David Morgan. Please call our office at [phone] if we can be of additional assistance.   Mazelle Huebert Prince [place], NP

## 2022-12-01 DIAGNOSIS — Z8673 Personal history of transient ischemic attack (TIA), and cerebral infarction without residual deficits: Secondary | ICD-10-CM | POA: Diagnosis not present

## 2022-12-01 DIAGNOSIS — F5104 Psychophysiologic insomnia: Secondary | ICD-10-CM | POA: Diagnosis not present

## 2022-12-01 DIAGNOSIS — F0150 Vascular dementia without behavioral disturbance: Secondary | ICD-10-CM | POA: Diagnosis not present

## 2022-12-01 DIAGNOSIS — R451 Restlessness and agitation: Secondary | ICD-10-CM | POA: Diagnosis not present

## 2022-12-15 DIAGNOSIS — I739 Peripheral vascular disease, unspecified: Secondary | ICD-10-CM | POA: Diagnosis not present

## 2022-12-15 DIAGNOSIS — L603 Nail dystrophy: Secondary | ICD-10-CM | POA: Diagnosis not present

## 2022-12-15 DIAGNOSIS — S90412A Abrasion, left great toe, initial encounter: Secondary | ICD-10-CM | POA: Diagnosis not present

## 2022-12-22 DIAGNOSIS — R451 Restlessness and agitation: Secondary | ICD-10-CM | POA: Diagnosis not present

## 2022-12-22 DIAGNOSIS — F5104 Psychophysiologic insomnia: Secondary | ICD-10-CM | POA: Diagnosis not present

## 2022-12-22 DIAGNOSIS — F0150 Vascular dementia without behavioral disturbance: Secondary | ICD-10-CM | POA: Diagnosis not present

## 2022-12-22 DIAGNOSIS — Z8673 Personal history of transient ischemic attack (TIA), and cerebral infarction without residual deficits: Secondary | ICD-10-CM | POA: Diagnosis not present

## 2023-01-01 ENCOUNTER — Encounter: Payer: Self-pay | Admitting: Nurse Practitioner

## 2023-01-01 ENCOUNTER — Non-Acute Institutional Stay: Payer: PPO | Admitting: Nurse Practitioner

## 2023-01-01 DIAGNOSIS — I5032 Chronic diastolic (congestive) heart failure: Secondary | ICD-10-CM

## 2023-01-01 DIAGNOSIS — F0390 Unspecified dementia without behavioral disturbance: Secondary | ICD-10-CM | POA: Diagnosis not present

## 2023-01-01 DIAGNOSIS — Z515 Encounter for palliative care: Secondary | ICD-10-CM | POA: Diagnosis not present

## 2023-01-01 DIAGNOSIS — R5381 Other malaise: Secondary | ICD-10-CM

## 2023-01-01 NOTE — Progress Notes (Addendum)
Therapist, nutritional Palliative Care Consult Note Telephone: [phone]  Fax: [phone]    Date of encounter: 01/01/23 8:44 PM PATIENT NAME: David Morgan [address] [place] [address]   [phone] (home)  DOB: 03-06-1925 MRN: [id number] PRIMARY CARE PROVIDER:    [facility]  RESPONSIBLE PARTY:    Contact Information     Name Relation Home Work Mobile   Morgan,David Spouse [phone]           I met face to face with David Morgan in facility. Palliative Care was asked to follow this patient by consultation request of  David Posner, MD to address advance care planning and complex medical decision making. This is a follow up visit.                                  ASSESSMENT AND PLAN / RECOMMENDATIONS:  Symptom Management/Plan: 1. Advance Care Planning;  DNR 2. Palliative care encounter; Palliative care encounter; Palliative medicine team will continue to support patient, patient's family, and medical team. Visit consisted of counseling and education dealing with the complex and emotionally intense issues of symptom management and palliative care in the setting of serious and potentially life-threatening illness 3. Debility secondary to CHF, COPD encourage turn/positioning, monitor skin, monitor respiratory status, weights, encourage nutrition, oob as able 4. Dementia, progressive, ongoing monitoring, supportive role. Monitor nutrition, weights 08/20/2022 weight 156.8 lbs 10/15/2022 weight 160.2 lbs 11/19/2022 weight 159.4 lbs 12/24/2022 weight 161.5 lbs Follow up Palliative Care Visit: Palliative care will continue to follow for complex medical decision making, advance care planning, and clarification of goals. Return  2 to 8 weeks or prn.   I spent 46 minutes providing this consultation. More than 50% of the time in this consultation was spent in counseling and care coordination.   PPS: 30% Chief Complaint: Follow up palliative  consult for complex medical decision making, address goals, manage ongoing symptoms HISTORY OF PRESENT ILLNESS:  David Morgan is a 89 y.o. year old male  with multiple medical problems including Dementia, diastolic heart failure, COPD, h/o pancreatitis, h/o choledocholithiasis, HTN, DM, disease of thyroid, BPH, HLD. David Morgan resides at [facility], requires assistance with transfers, mobility, adl's including bathing, dressing, incontinence. David Morgan requires assistance with feeding with fair appetite, weight not available. Staff endorses no new changes or concerns. Purpose of today PC f/u visit further discussion monitor trends of appetite, weights, monitor for functional, cognitive decline with chronic disease progression, assess any active symptoms, supportive role.At present David Morgan is lying in bed, appears comfortable, debilitated. David Morgan does make eye contact, answers simple questions ros though difficult to get David Morgan to continue eye contact with visit. No meaningful discussion with cognitive impairment. David Morgan was cooperative with assessment. Medications, poc, goc weights, discussed with staff, weight gain, sleeping encouraged mobility, oob. No recent falls, wounds, hospitalizations, infections. Continue to be followed by pc, updated staff. Attempted to contact Ms Rhame, wife with update.    History obtained from review of EMR, discussion with primary team, and interview with family, facility staff/caregiver and/or David. Morgan.  I reviewed available labs, medications, imaging, studies and related documents from the EMR.  Records reviewed and summarized above.    Physical Exam: General: frail appearing, debilitated, confused male ENMT: oral mucous membranes moist CV:  RRR Pulmonary:  breath sounds clear MSK: non ambulatory Neuro: +  generalized weakness Psych: + cognitive impairment  Thank you for the opportunity to participate in the care of David.  Morgan. Please call our office at [phone] if we can be of additional assistance.   David Morgan Prince [place], NP

## 2023-01-07 DIAGNOSIS — L89600 Pressure ulcer of unspecified heel, unstageable: Secondary | ICD-10-CM | POA: Diagnosis not present

## 2023-01-08 DIAGNOSIS — L89614 Pressure ulcer of right heel, stage 4: Secondary | ICD-10-CM | POA: Diagnosis not present

## 2023-01-09 DIAGNOSIS — E119 Type 2 diabetes mellitus without complications: Secondary | ICD-10-CM | POA: Diagnosis not present

## 2023-01-14 DIAGNOSIS — H16213 Exposure keratoconjunctivitis, bilateral: Secondary | ICD-10-CM | POA: Diagnosis not present

## 2023-01-15 DIAGNOSIS — L89614 Pressure ulcer of right heel, stage 4: Secondary | ICD-10-CM | POA: Diagnosis not present

## 2023-01-15 DIAGNOSIS — N400 Enlarged prostate without lower urinary tract symptoms: Secondary | ICD-10-CM | POA: Diagnosis not present

## 2023-01-15 DIAGNOSIS — F0390 Unspecified dementia without behavioral disturbance: Secondary | ICD-10-CM | POA: Diagnosis not present

## 2023-01-15 DIAGNOSIS — M6281 Muscle weakness (generalized): Secondary | ICD-10-CM | POA: Diagnosis not present

## 2023-01-15 DIAGNOSIS — G20C Parkinsonism, unspecified: Secondary | ICD-10-CM | POA: Diagnosis not present

## 2023-01-15 DIAGNOSIS — F01C3 Vascular dementia, severe, with mood disturbance: Secondary | ICD-10-CM | POA: Diagnosis not present

## 2023-01-15 DIAGNOSIS — R627 Adult failure to thrive: Secondary | ICD-10-CM | POA: Diagnosis not present

## 2023-01-15 DIAGNOSIS — L89600 Pressure ulcer of unspecified heel, unstageable: Secondary | ICD-10-CM | POA: Diagnosis not present

## 2023-01-15 DIAGNOSIS — J449 Chronic obstructive pulmonary disease, unspecified: Secondary | ICD-10-CM | POA: Diagnosis not present

## 2023-01-15 DIAGNOSIS — L89623 Pressure ulcer of left heel, stage 3: Secondary | ICD-10-CM | POA: Diagnosis not present

## 2023-01-15 DIAGNOSIS — E119 Type 2 diabetes mellitus without complications: Secondary | ICD-10-CM | POA: Diagnosis not present

## 2023-01-19 DIAGNOSIS — I70201 Unspecified atherosclerosis of native arteries of extremities, right leg: Secondary | ICD-10-CM | POA: Diagnosis not present

## 2023-01-21 DIAGNOSIS — I872 Venous insufficiency (chronic) (peripheral): Secondary | ICD-10-CM | POA: Diagnosis not present

## 2023-01-21 DIAGNOSIS — L89614 Pressure ulcer of right heel, stage 4: Secondary | ICD-10-CM | POA: Diagnosis not present

## 2023-01-21 DIAGNOSIS — L89623 Pressure ulcer of left heel, stage 3: Secondary | ICD-10-CM | POA: Diagnosis not present

## 2023-01-22 DIAGNOSIS — L89614 Pressure ulcer of right heel, stage 4: Secondary | ICD-10-CM | POA: Diagnosis not present

## 2023-01-22 DIAGNOSIS — E119 Type 2 diabetes mellitus without complications: Secondary | ICD-10-CM | POA: Diagnosis not present

## 2023-01-22 DIAGNOSIS — M6281 Muscle weakness (generalized): Secondary | ICD-10-CM | POA: Diagnosis not present

## 2023-01-22 DIAGNOSIS — F0390 Unspecified dementia without behavioral disturbance: Secondary | ICD-10-CM | POA: Diagnosis not present

## 2023-01-27 DIAGNOSIS — L89614 Pressure ulcer of right heel, stage 4: Secondary | ICD-10-CM | POA: Diagnosis not present

## 2023-01-27 DIAGNOSIS — L89623 Pressure ulcer of left heel, stage 3: Secondary | ICD-10-CM | POA: Diagnosis not present

## 2023-01-29 DIAGNOSIS — L89614 Pressure ulcer of right heel, stage 4: Secondary | ICD-10-CM | POA: Diagnosis not present

## 2023-02-02 DIAGNOSIS — L89614 Pressure ulcer of right heel, stage 4: Secondary | ICD-10-CM | POA: Diagnosis not present

## 2023-02-05 DIAGNOSIS — L89614 Pressure ulcer of right heel, stage 4: Secondary | ICD-10-CM | POA: Diagnosis not present

## 2023-02-06 ENCOUNTER — Encounter: Payer: Self-pay | Admitting: Nurse Practitioner

## 2023-02-06 ENCOUNTER — Non-Acute Institutional Stay: Payer: PPO | Admitting: Nurse Practitioner

## 2023-02-06 DIAGNOSIS — I10 Essential (primary) hypertension: Secondary | ICD-10-CM | POA: Diagnosis not present

## 2023-02-06 DIAGNOSIS — Z515 Encounter for palliative care: Secondary | ICD-10-CM

## 2023-02-06 DIAGNOSIS — R5381 Other malaise: Secondary | ICD-10-CM

## 2023-02-06 DIAGNOSIS — I5032 Chronic diastolic (congestive) heart failure: Secondary | ICD-10-CM

## 2023-02-06 DIAGNOSIS — E7800 Pure hypercholesterolemia, unspecified: Secondary | ICD-10-CM | POA: Diagnosis not present

## 2023-02-06 DIAGNOSIS — F0390 Unspecified dementia without behavioral disturbance: Secondary | ICD-10-CM | POA: Diagnosis not present

## 2023-02-06 DIAGNOSIS — R51 Headache with orthostatic component, not elsewhere classified: Secondary | ICD-10-CM | POA: Diagnosis not present

## 2023-02-06 NOTE — Progress Notes (Signed)
Therapist, nutritional Palliative Care Consult Note Telephone: [phone]  Fax: [phone]    Date of encounter: 02/06/23 5:01 PM PATIENT NAME: David Morgan [address] [place] [address]   [phone] (home)  DOB: 06-05-1925 MRN: [id number] PRIMARY CARE PROVIDER:    [facility]  RESPONSIBLE PARTY:    Contact Information     Name Relation Home Work Mobile   Barnett,[name] Spouse [phone]       I met face to face with David Morgan in facility. Palliative Care was asked to follow this patient by consultation request of  [name], MD to address advance care planning and complex medical decision making. This is a follow up visit.                                  ASSESSMENT AND PLAN / RECOMMENDATIONS:  Symptom Management/Plan: 1. Advance Care Planning;  DNR 2. Palliative care encounter; Palliative care encounter; Palliative medicine team will continue to support patient, patient's family, and medical team. Visit consisted of counseling and education dealing with the complex and emotionally intense issues of symptom management and palliative care in the setting of serious and potentially life-threatening illness 3. Debility secondary to CHF, COPD no recent exacerbation, reviewed weights,  encourage turn/positioning, monitor skin, monitor respiratory status, weights, encourage nutrition, oob as able 4. Dementia, progressive, ongoing monitoring, supportive role. Monitor nutrition, weights 08/20/2022 weight 156.8 lbs 10/15/2022 weight 160.2 lbs 11/19/2022 weight 159.4 lbs 12/24/2022 weight 161.5 lbs 02/04/2023 weight 164.1 lbs Follow up Palliative Care Visit: Palliative care will continue to follow for complex medical decision making, advance care planning, and clarification of goals. Return  2 to 8 weeks or prn.   I spent 45 minutes providing this consultation. More than 50% of the time in this consultation was spent in counseling and care  coordination.   PPS: 30% Chief Complaint: Follow up palliative consult for complex medical decision making, address goals, manage ongoing symptoms HISTORY OF PRESENT ILLNESS:  David Morgan is a 89 y.o. year old male  with multiple medical problems including Dementia, diastolic heart failure, COPD, h/o pancreatitis, h/o choledocholithiasis, HTN, DM, disease of thyroid, BPH, HLD. David Morgan resides at [facility], requires assistance with transfers, mobility, adl's including bathing, dressing, incontinence. David Morgan requires assistance with feeding with fair appetite, weight not available. Staff endorses no new changes or concerns. Purpose of today PC f/u visit further discussion monitor trends of appetite, weights, monitor for functional, cognitive decline with chronic disease progression, assess any active symptoms, supportive role.At present David Morgan is lying in bed, sleeping, awoke to verbal cues making eye contact. Limited interaction due to cognitive impairment. David Morgan was cooperative with assessment. Medications, poc, goc weights, discussed with staff, weight gain, sleeping encouraged mobility, oob. No recent falls, wounds, hospitalizations, infections. Continue to be followed by pc, updated staff. Attempted to contact David Morgan, wife with update.    History obtained from review of EMR, discussion with primary team, and interview with family, facility staff/caregiver and/or David. Morgan.  I reviewed available labs, medications, imaging, studies and related documents from the EMR.  Records reviewed and summarized above.    Physical Exam: General: frail appearing, debilitated, confused male ENMT: oral mucous membranes moist CV:  RRR Pulmonary:  breath sounds clear MSK: non ambulatory Neuro: +  generalized weakness Psych: + cognitive impairment Thank you for the opportunity  to participate in the care of David. Morgan. Please call our office at [phone] if we can be of  additional assistance.   David Bouldin Prince [place], NP

## 2023-02-09 DIAGNOSIS — I872 Venous insufficiency (chronic) (peripheral): Secondary | ICD-10-CM | POA: Diagnosis not present

## 2023-02-09 DIAGNOSIS — L89614 Pressure ulcer of right heel, stage 4: Secondary | ICD-10-CM | POA: Diagnosis not present

## 2023-02-10 DIAGNOSIS — L89614 Pressure ulcer of right heel, stage 4: Secondary | ICD-10-CM | POA: Diagnosis not present

## 2023-02-11 DIAGNOSIS — M19071 Primary osteoarthritis, right ankle and foot: Secondary | ICD-10-CM | POA: Diagnosis not present

## 2023-02-11 DIAGNOSIS — L97519 Non-pressure chronic ulcer of other part of right foot with unspecified severity: Secondary | ICD-10-CM | POA: Diagnosis not present

## 2023-02-11 DIAGNOSIS — M2041 Other hammer toe(s) (acquired), right foot: Secondary | ICD-10-CM | POA: Diagnosis not present

## 2023-02-12 DIAGNOSIS — L89614 Pressure ulcer of right heel, stage 4: Secondary | ICD-10-CM | POA: Diagnosis not present

## 2023-02-12 DIAGNOSIS — M86171 Other acute osteomyelitis, right ankle and foot: Secondary | ICD-10-CM | POA: Diagnosis not present

## 2023-02-13 DIAGNOSIS — L89614 Pressure ulcer of right heel, stage 4: Secondary | ICD-10-CM | POA: Diagnosis not present

## 2023-02-13 DIAGNOSIS — M86171 Other acute osteomyelitis, right ankle and foot: Secondary | ICD-10-CM | POA: Diagnosis not present

## 2023-02-14 DIAGNOSIS — Z1622 Resistance to vancomycin related antibiotics: Secondary | ICD-10-CM | POA: Diagnosis not present

## 2023-02-19 DIAGNOSIS — L89614 Pressure ulcer of right heel, stage 4: Secondary | ICD-10-CM | POA: Diagnosis not present

## 2023-02-24 DIAGNOSIS — L89614 Pressure ulcer of right heel, stage 4: Secondary | ICD-10-CM | POA: Diagnosis not present

## 2023-02-25 DIAGNOSIS — L89614 Pressure ulcer of right heel, stage 4: Secondary | ICD-10-CM | POA: Diagnosis not present

## 2023-02-25 DIAGNOSIS — M86171 Other acute osteomyelitis, right ankle and foot: Secondary | ICD-10-CM | POA: Diagnosis not present

## 2023-02-26 DIAGNOSIS — D649 Anemia, unspecified: Secondary | ICD-10-CM | POA: Diagnosis not present

## 2023-02-26 DIAGNOSIS — L89614 Pressure ulcer of right heel, stage 4: Secondary | ICD-10-CM | POA: Diagnosis not present

## 2023-02-27 DIAGNOSIS — E7800 Pure hypercholesterolemia, unspecified: Secondary | ICD-10-CM | POA: Diagnosis not present

## 2023-02-27 DIAGNOSIS — R51 Headache with orthostatic component, not elsewhere classified: Secondary | ICD-10-CM | POA: Diagnosis not present

## 2023-02-27 DIAGNOSIS — I10 Essential (primary) hypertension: Secondary | ICD-10-CM | POA: Diagnosis not present

## 2023-03-02 DIAGNOSIS — L89614 Pressure ulcer of right heel, stage 4: Secondary | ICD-10-CM | POA: Diagnosis not present

## 2023-03-02 DIAGNOSIS — M86171 Other acute osteomyelitis, right ankle and foot: Secondary | ICD-10-CM | POA: Diagnosis not present

## 2023-03-03 DIAGNOSIS — R54 Age-related physical debility: Secondary | ICD-10-CM | POA: Diagnosis not present

## 2023-03-03 DIAGNOSIS — I5020 Unspecified systolic (congestive) heart failure: Secondary | ICD-10-CM | POA: Diagnosis not present

## 2023-03-03 DIAGNOSIS — R630 Anorexia: Secondary | ICD-10-CM | POA: Diagnosis not present

## 2023-03-03 DIAGNOSIS — F0390 Unspecified dementia without behavioral disturbance: Secondary | ICD-10-CM | POA: Diagnosis not present

## 2023-03-03 DIAGNOSIS — J449 Chronic obstructive pulmonary disease, unspecified: Secondary | ICD-10-CM | POA: Diagnosis not present

## 2023-03-03 DIAGNOSIS — Z515 Encounter for palliative care: Secondary | ICD-10-CM | POA: Diagnosis not present

## 2023-03-04 DIAGNOSIS — R600 Localized edema: Secondary | ICD-10-CM | POA: Diagnosis not present

## 2023-03-05 DIAGNOSIS — L603 Nail dystrophy: Secondary | ICD-10-CM | POA: Diagnosis not present

## 2023-03-05 DIAGNOSIS — R600 Localized edema: Secondary | ICD-10-CM | POA: Diagnosis not present

## 2023-03-05 DIAGNOSIS — I739 Peripheral vascular disease, unspecified: Secondary | ICD-10-CM | POA: Diagnosis not present

## 2023-03-05 DIAGNOSIS — L89614 Pressure ulcer of right heel, stage 4: Secondary | ICD-10-CM | POA: Diagnosis not present

## 2023-03-09 DIAGNOSIS — L89614 Pressure ulcer of right heel, stage 4: Secondary | ICD-10-CM | POA: Diagnosis not present

## 2023-03-09 DIAGNOSIS — D649 Anemia, unspecified: Secondary | ICD-10-CM | POA: Diagnosis not present

## 2023-03-09 DIAGNOSIS — E7800 Pure hypercholesterolemia, unspecified: Secondary | ICD-10-CM | POA: Diagnosis not present

## 2023-03-10 DIAGNOSIS — M86171 Other acute osteomyelitis, right ankle and foot: Secondary | ICD-10-CM | POA: Diagnosis not present

## 2023-03-12 DIAGNOSIS — L89614 Pressure ulcer of right heel, stage 4: Secondary | ICD-10-CM | POA: Diagnosis not present

## 2023-03-12 DIAGNOSIS — L0889 Other specified local infections of the skin and subcutaneous tissue: Secondary | ICD-10-CM | POA: Diagnosis not present

## 2023-03-16 DIAGNOSIS — L89614 Pressure ulcer of right heel, stage 4: Secondary | ICD-10-CM | POA: Diagnosis not present

## 2023-03-16 DIAGNOSIS — N400 Enlarged prostate without lower urinary tract symptoms: Secondary | ICD-10-CM | POA: Diagnosis not present

## 2023-03-16 DIAGNOSIS — L89623 Pressure ulcer of left heel, stage 3: Secondary | ICD-10-CM | POA: Diagnosis not present

## 2023-03-16 DIAGNOSIS — J449 Chronic obstructive pulmonary disease, unspecified: Secondary | ICD-10-CM | POA: Diagnosis not present

## 2023-03-16 DIAGNOSIS — M86171 Other acute osteomyelitis, right ankle and foot: Secondary | ICD-10-CM | POA: Diagnosis not present

## 2023-03-19 DIAGNOSIS — L89614 Pressure ulcer of right heel, stage 4: Secondary | ICD-10-CM | POA: Diagnosis not present

## 2023-03-19 DIAGNOSIS — E119 Type 2 diabetes mellitus without complications: Secondary | ICD-10-CM | POA: Diagnosis not present

## 2023-03-19 DIAGNOSIS — F0390 Unspecified dementia without behavioral disturbance: Secondary | ICD-10-CM | POA: Diagnosis not present

## 2023-03-19 DIAGNOSIS — M6281 Muscle weakness (generalized): Secondary | ICD-10-CM | POA: Diagnosis not present

## 2023-03-25 DIAGNOSIS — R41841 Cognitive communication deficit: Secondary | ICD-10-CM | POA: Diagnosis not present

## 2023-03-25 DIAGNOSIS — G218 Other secondary parkinsonism: Secondary | ICD-10-CM | POA: Diagnosis not present

## 2023-03-25 DIAGNOSIS — F01B18 Vascular dementia, moderate, with other behavioral disturbance: Secondary | ICD-10-CM | POA: Diagnosis not present

## 2023-03-25 DIAGNOSIS — R451 Restlessness and agitation: Secondary | ICD-10-CM | POA: Diagnosis not present

## 2023-03-25 DIAGNOSIS — G4700 Insomnia, unspecified: Secondary | ICD-10-CM | POA: Diagnosis not present

## 2023-04-09 DIAGNOSIS — R630 Anorexia: Secondary | ICD-10-CM | POA: Diagnosis not present

## 2023-04-09 DIAGNOSIS — R54 Age-related physical debility: Secondary | ICD-10-CM | POA: Diagnosis not present

## 2023-04-09 DIAGNOSIS — F0390 Unspecified dementia without behavioral disturbance: Secondary | ICD-10-CM | POA: Diagnosis not present

## 2023-04-09 DIAGNOSIS — Z515 Encounter for palliative care: Secondary | ICD-10-CM | POA: Diagnosis not present

## 2023-04-09 DIAGNOSIS — I5020 Unspecified systolic (congestive) heart failure: Secondary | ICD-10-CM | POA: Diagnosis not present

## 2023-04-09 DIAGNOSIS — J449 Chronic obstructive pulmonary disease, unspecified: Secondary | ICD-10-CM | POA: Diagnosis not present

## 2023-04-21 DIAGNOSIS — R451 Restlessness and agitation: Secondary | ICD-10-CM | POA: Diagnosis not present

## 2023-04-21 DIAGNOSIS — I872 Venous insufficiency (chronic) (peripheral): Secondary | ICD-10-CM | POA: Diagnosis not present

## 2023-04-21 DIAGNOSIS — L89614 Pressure ulcer of right heel, stage 4: Secondary | ICD-10-CM | POA: Diagnosis not present

## 2023-04-21 DIAGNOSIS — G4700 Insomnia, unspecified: Secondary | ICD-10-CM | POA: Diagnosis not present

## 2023-04-21 DIAGNOSIS — R41841 Cognitive communication deficit: Secondary | ICD-10-CM | POA: Diagnosis not present

## 2023-04-21 DIAGNOSIS — L03116 Cellulitis of left lower limb: Secondary | ICD-10-CM | POA: Diagnosis not present

## 2023-04-21 DIAGNOSIS — L89623 Pressure ulcer of left heel, stage 3: Secondary | ICD-10-CM | POA: Diagnosis not present

## 2023-04-21 DIAGNOSIS — F01B18 Vascular dementia, moderate, with other behavioral disturbance: Secondary | ICD-10-CM | POA: Diagnosis not present

## 2023-04-21 DIAGNOSIS — G218 Other secondary parkinsonism: Secondary | ICD-10-CM | POA: Diagnosis not present

## 2023-04-29 ENCOUNTER — Other Ambulatory Visit (INDEPENDENT_AMBULATORY_CARE_PROVIDER_SITE_OTHER): Payer: Self-pay | Admitting: Nurse Practitioner

## 2023-04-29 DIAGNOSIS — S91301A Unspecified open wound, right foot, initial encounter: Secondary | ICD-10-CM

## 2023-05-03 DIAGNOSIS — I7025 Atherosclerosis of native arteries of other extremities with ulceration: Secondary | ICD-10-CM | POA: Insufficient documentation

## 2023-05-03 DIAGNOSIS — J449 Chronic obstructive pulmonary disease, unspecified: Secondary | ICD-10-CM | POA: Insufficient documentation

## 2023-05-03 NOTE — Progress Notes (Deleted)
MRN : [id number]  David Morgan is a 89 y.o. (1925/05/14) male who presents with chief complaint of check circulation.  History of Present Illness:   The patient is seen for evaluation of painful lower extremities and diminished pulses associated with ulceration of the foot.  The patient notes the ulcer has been present for multiple weeks and has not been improving.  It is very painful and has had some drainage.  No specific history of trauma noted by the patient.  The patient denies fever or chills.  the patient does have diabetes which has been difficult to control.  Patient notes prior to the ulcer developing the extremities were painful particularly with walking.  The patient denies rest pain or dangling of an extremity off the side of the bed during the night for relief. No prior interventions or surgeries.  No history of back problems or DJD of the lumbar sacral spine.   The patient denies amaurosis fugax or recent TIA symptoms. There are no recent neurological changes noted. The patient denies history of DVT, PE or superficial thrombophlebitis. The patient denies recent episodes of angina or shortness of breath.   No outpatient medications have been marked as taking for the 05/04/23 encounter (Appointment) with Gilda Crease, Latina Craver, MD.    Past Medical History:  Diagnosis Date   Arthritis    Cancer [facility])    Claudication (HCC)    Diverticulitis    Emphysema/COPD (HCC)    History of bladder cancer    History of carotid artery stenosis    History of TIA (transient ischemic attack)    2012--  NO RESIDUAL (PER SCAN HAD A PREVIOUS TIA BEFORE 2012)   Hyperlipidemia    Hypertension    Hypothyroidism    Lesion of bladder    Mild asthma    NO INHALER   Nocturia    Peripheral vascular disease (HCC)    Short of breath on exertion    Urgency of urination    Wears glasses     Past Surgical History:   Procedure Laterality Date   CAROTID ENDARTERECTOMY Right 2005   CARPAL TUNNEL RELEASE Bilateral 2002  &  2007   CATARACT EXTRACTION W/ INTRAOCULAR LENS  IMPLANT, BILATERAL     CYSTOSCOPY W/ RETROGRADES Bilateral 12/26/2013   Procedure: BILATERAL RETROGRADE PYELOGRAM;  Surgeon: Garnett Farm, MD;  Location: [facility] [place];  Service: Urology;  Laterality: Bilateral;   CYSTOSCOPY WITH BIOPSY N/A 12/26/2013   Procedure: CYSTOSCOPY WITH BLADDER BIOPSY;  Surgeon: Garnett Farm, MD;  Location: [facility];  Service: Urology;  Laterality: N/A;   ERCP N/A 11/19/2019   Procedure: ENDOSCOPIC RETROGRADE CHOLANGIOPANCREATOGRAPHY (ERCP);  Surgeon: Iva Boop, MD;  Location: [facility] ENDOSCOPY;  Service: Endoscopy;  Laterality: N/A;   INGUINAL HERNIA REPAIR  YRS AGO   LAPAROSCOPIC LYSIS OF ADHESIONS  06/28/2015   Procedure: LAPAROSCOPIC LYSIS OF ADHESIONS;  Surgeon: Ricarda Frame, MD;  Location: [facility] ORS;  Service: General;;   LAPAROSCOPY  06/28/2015   Procedure: LAPAROSCOPY DIAGNOSTIC;  Surgeon: Ricarda Frame, MD;  Location: [facility];  Service:  General;;   LAPAROTOMY N/A 09/03/2015   Procedure: umbilical hernia repair with mesh;  Surgeon: Gladis Riffle, MD;  Location: [facility] ORS;  Service: General;  Laterality: N/A;   ORIF HIP FRACTURE Left 2008   RETAINED HARDWARE   REMOVAL OF STONES  11/19/2019   Procedure: REMOVAL OF STONES;  Surgeon: Iva Boop, MD;  Location: [facility] ENDOSCOPY;  Service: Endoscopy;;   RIGHT SHOULDER  SURGERY  2005   SPHINCTEROTOMY  11/19/2019   Procedure: SPHINCTEROTOMY;  Surgeon: Iva Boop, MD;  Location: [facility] ENDOSCOPY;  Service: Endoscopy;;   TOTAL KNEE ARTHROPLASTY Right 2004   TRANSURETHRAL RESECTION OF BLADDER TUMOR  1990   UMBILICAL HERNIA REPAIR   2009  &  2011    Social History Social History   Tobacco Use   Smoking status: Former    Current packs/day: 0.00    Average packs/day: 1 pack/day for 40.0 years (40.0 ttl pk-yrs)    Types:  Cigarettes    Start date: 12/21/1953    Quit date: 12/21/1993    Years since quitting: 29.3   Smokeless tobacco: Never  Vaping Use   Vaping status: Never Used  Substance Use Topics   Alcohol use: No    Comment: RARE   Drug use: No    Family History Family History  Problem Relation Age of Onset   Hypertension Father    Alzheimer's disease Mother     Allergies  Allergen Reactions   Adhesive [Tape] Rash   Augmentin [Amoxicillin-Pot Clavulanate] Itching, Rash and Other (See Comments)    Has patient had a PCN reaction causing immediate rash, facial/tongue/throat swelling, SOB or lightheadedness with hypotension: No Has patient had a PCN reaction causing severe rash involving mucus membranes or skin necrosis: No Has patient had a PCN reaction that required hospitalization No Has patient had a PCN reaction occurring within the last 10 years: No If all of the above answers are "NO", then may proceed with Cephalosporin use.      REVIEW OF SYSTEMS (Negative unless checked)  Constitutional: [] Weight loss  [] Fever  [] Chills Cardiac: [] Chest pain   [] Chest pressure   [] Palpitations   [] Shortness of breath when laying flat   [] Shortness of breath with exertion. Vascular:  [x] Pain in legs with walking   [] Pain in legs at rest  [] History of DVT   [] Phlebitis   [] Swelling in legs   [] Varicose veins   [] Non-healing ulcers Pulmonary:   [] Uses home oxygen   [] Productive cough   [] Hemoptysis   [] Wheeze  [] COPD   [] Asthma Neurologic:  [] Dizziness   [] Seizures   [] History of stroke   [] History of TIA  [] Aphasia   [] Vissual changes   [] Weakness or numbness in arm   [] Weakness or numbness in leg Musculoskeletal:   [] Joint swelling   [] Joint pain   [] Low back pain Hematologic:  [] Easy bruising  [] Easy bleeding   [] Hypercoagulable state   [] Anemic Gastrointestinal:  [] Diarrhea   [] Vomiting  [] Gastroesophageal reflux/heartburn   [] Difficulty swallowing. Genitourinary:  [] Chronic kidney disease    [] Difficult urination  [] Frequent urination   [] Blood in urine Skin:  [] Rashes   [] Ulcers  Psychological:  [] History of anxiety   []  History of major depression.  Physical Examination  There were no vitals filed for this visit. There is no height or weight on file to calculate BMI. Gen: WD/WN, NAD Head: [place]/AT, No temporalis wasting.  Ear/Nose/Throat: Hearing grossly intact, nares w/o erythema or drainage Eyes: PER, EOMI, sclera nonicteric.  Neck: Supple,  no masses.  No bruit or JVD.  Pulmonary:  Good air movement, no audible wheezing, no use of accessory muscles.  Cardiac: RRR, normal S1, S2, no Murmurs. Vascular:  mild trophic changes, no open wounds Vessel Right Left  Radial Palpable Palpable  PT Not Palpable Not Palpable  DP Not Palpable Not Palpable  Gastrointestinal: soft, non-distended. No guarding/no peritoneal signs.  Musculoskeletal: M/S 5/5 throughout.  No visible deformity.  Neurologic: CN 2-12 intact. Pain and light touch intact in extremities.  Symmetrical.  Speech is fluent. Motor exam as listed above. Psychiatric: Judgment intact, Mood & affect appropriate for pt's clinical situation. Dermatologic: No rashes or ulcers noted.  No changes consistent with cellulitis.   CBC Lab Results  Component Value Date   WBC 5.2 06/27/2020   HGB 13.4 06/27/2020   HCT 42.4 06/27/2020   MCV 88.9 06/27/2020   PLT 153 06/27/2020    BMET    Component Value Date/Time   NA 137 06/29/2020 0603   K 3.5 06/29/2020 0603   CL 101 06/29/2020 0603   CO2 26 06/29/2020 0603   GLUCOSE 118 (H) 06/29/2020 0603   BUN 23 06/29/2020 0603   CREATININE 0.73 06/29/2020 0603   CALCIUM 8.6 (L) 06/29/2020 0603   GFRNONAA >60 06/29/2020 0603   GFRAA >60 12/02/2019 0429   CrCl cannot be calculated (Patient's most recent lab result is older than the maximum 21 days allowed.).  COAG Lab Results  Component Value Date   INR 1.2 06/24/2020   INR 1.1 06/23/2020   INR 1.2 11/29/2019     Radiology No results found.   Assessment/Plan There are no diagnoses linked to this encounter.   Levora Dredge, MD  05/03/2023 2:57 PM

## 2023-05-04 ENCOUNTER — Encounter (INDEPENDENT_AMBULATORY_CARE_PROVIDER_SITE_OTHER): Payer: Self-pay

## 2023-05-04 ENCOUNTER — Encounter (INDEPENDENT_AMBULATORY_CARE_PROVIDER_SITE_OTHER): Payer: Self-pay | Admitting: Vascular Surgery

## 2023-05-04 DIAGNOSIS — E785 Hyperlipidemia, unspecified: Secondary | ICD-10-CM

## 2023-05-04 DIAGNOSIS — J449 Chronic obstructive pulmonary disease, unspecified: Secondary | ICD-10-CM

## 2023-05-04 DIAGNOSIS — E1165 Type 2 diabetes mellitus with hyperglycemia: Secondary | ICD-10-CM

## 2023-05-04 DIAGNOSIS — I7025 Atherosclerosis of native arteries of other extremities with ulceration: Secondary | ICD-10-CM

## 2023-05-04 DIAGNOSIS — I10 Essential (primary) hypertension: Secondary | ICD-10-CM

## 2023-05-05 DIAGNOSIS — F01B18 Vascular dementia, moderate, with other behavioral disturbance: Secondary | ICD-10-CM | POA: Diagnosis not present

## 2023-05-05 DIAGNOSIS — G4700 Insomnia, unspecified: Secondary | ICD-10-CM | POA: Diagnosis not present

## 2023-05-05 DIAGNOSIS — G218 Other secondary parkinsonism: Secondary | ICD-10-CM | POA: Diagnosis not present

## 2023-05-05 DIAGNOSIS — R451 Restlessness and agitation: Secondary | ICD-10-CM | POA: Diagnosis not present

## 2023-05-05 DIAGNOSIS — R41841 Cognitive communication deficit: Secondary | ICD-10-CM | POA: Diagnosis not present

## 2023-05-18 DIAGNOSIS — N400 Enlarged prostate without lower urinary tract symptoms: Secondary | ICD-10-CM | POA: Diagnosis not present

## 2023-05-18 DIAGNOSIS — J449 Chronic obstructive pulmonary disease, unspecified: Secondary | ICD-10-CM | POA: Diagnosis not present

## 2023-05-18 DIAGNOSIS — F01C3 Vascular dementia, severe, with mood disturbance: Secondary | ICD-10-CM | POA: Diagnosis not present

## 2023-05-18 DIAGNOSIS — E039 Hypothyroidism, unspecified: Secondary | ICD-10-CM | POA: Diagnosis not present

## 2023-05-26 DIAGNOSIS — L03116 Cellulitis of left lower limb: Secondary | ICD-10-CM | POA: Diagnosis not present

## 2023-06-04 ENCOUNTER — Encounter (INDEPENDENT_AMBULATORY_CARE_PROVIDER_SITE_OTHER): Payer: Medicaid Other

## 2023-06-04 ENCOUNTER — Ambulatory Visit (INDEPENDENT_AMBULATORY_CARE_PROVIDER_SITE_OTHER): Payer: Medicaid Other | Admitting: Nurse Practitioner

## 2023-06-09 DIAGNOSIS — G4700 Insomnia, unspecified: Secondary | ICD-10-CM | POA: Diagnosis not present

## 2023-06-09 DIAGNOSIS — F01B18 Vascular dementia, moderate, with other behavioral disturbance: Secondary | ICD-10-CM | POA: Diagnosis not present

## 2023-06-09 DIAGNOSIS — G218 Other secondary parkinsonism: Secondary | ICD-10-CM | POA: Diagnosis not present

## 2023-06-09 DIAGNOSIS — R41841 Cognitive communication deficit: Secondary | ICD-10-CM | POA: Diagnosis not present

## 2023-06-23 DIAGNOSIS — R509 Fever, unspecified: Secondary | ICD-10-CM | POA: Diagnosis not present

## 2023-06-23 DIAGNOSIS — R5383 Other fatigue: Secondary | ICD-10-CM | POA: Diagnosis not present

## 2023-06-25 DIAGNOSIS — R4182 Altered mental status, unspecified: Secondary | ICD-10-CM | POA: Diagnosis not present

## 2023-06-26 DIAGNOSIS — R4182 Altered mental status, unspecified: Secondary | ICD-10-CM | POA: Diagnosis not present

## 2023-06-26 DIAGNOSIS — R5081 Fever presenting with conditions classified elsewhere: Secondary | ICD-10-CM | POA: Diagnosis not present

## 2023-07-19 DEATH — deceased
